# Patient Record
Sex: Female | Born: 1945 | Race: White | Hispanic: No | Marital: Married | Smoking: Never smoker
Health system: Southern US, Community
[De-identification: ages and names within clinical notes are randomized; demographics above are authoritative.]

## PROBLEM LIST (undated history)

## (undated) DIAGNOSIS — M1990 Unspecified osteoarthritis, unspecified site: Secondary | ICD-10-CM

## (undated) DIAGNOSIS — R0789 Other chest pain: Secondary | ICD-10-CM

## (undated) DIAGNOSIS — Z87442 Personal history of urinary calculi: Secondary | ICD-10-CM

## (undated) DIAGNOSIS — K635 Polyp of colon: Secondary | ICD-10-CM

## (undated) DIAGNOSIS — F411 Generalized anxiety disorder: Secondary | ICD-10-CM

## (undated) DIAGNOSIS — F430 Acute stress reaction: Secondary | ICD-10-CM

## (undated) DIAGNOSIS — M810 Age-related osteoporosis without current pathological fracture: Secondary | ICD-10-CM

## (undated) DIAGNOSIS — R0600 Dyspnea, unspecified: Secondary | ICD-10-CM

## (undated) DIAGNOSIS — J189 Pneumonia, unspecified organism: Secondary | ICD-10-CM

## (undated) DIAGNOSIS — G43909 Migraine, unspecified, not intractable, without status migrainosus: Secondary | ICD-10-CM

## (undated) DIAGNOSIS — R928 Other abnormal and inconclusive findings on diagnostic imaging of breast: Secondary | ICD-10-CM

## (undated) DIAGNOSIS — T7840XA Allergy, unspecified, initial encounter: Secondary | ICD-10-CM

## (undated) DIAGNOSIS — K219 Gastro-esophageal reflux disease without esophagitis: Secondary | ICD-10-CM

## (undated) DIAGNOSIS — R079 Chest pain, unspecified: Secondary | ICD-10-CM

## (undated) DIAGNOSIS — Z9221 Personal history of antineoplastic chemotherapy: Secondary | ICD-10-CM

## (undated) DIAGNOSIS — Z923 Personal history of irradiation: Secondary | ICD-10-CM

## (undated) DIAGNOSIS — C801 Malignant (primary) neoplasm, unspecified: Secondary | ICD-10-CM

## (undated) DIAGNOSIS — E0590 Thyrotoxicosis, unspecified without thyrotoxic crisis or storm: Secondary | ICD-10-CM

## (undated) DIAGNOSIS — H269 Unspecified cataract: Secondary | ICD-10-CM

## (undated) DIAGNOSIS — I10 Essential (primary) hypertension: Secondary | ICD-10-CM

## (undated) DIAGNOSIS — E039 Hypothyroidism, unspecified: Secondary | ICD-10-CM

## (undated) DIAGNOSIS — E559 Vitamin D deficiency, unspecified: Secondary | ICD-10-CM

## (undated) DIAGNOSIS — J309 Allergic rhinitis, unspecified: Secondary | ICD-10-CM

## (undated) DIAGNOSIS — F419 Anxiety disorder, unspecified: Secondary | ICD-10-CM

## (undated) DIAGNOSIS — N952 Postmenopausal atrophic vaginitis: Secondary | ICD-10-CM

## (undated) HISTORY — DX: Polyp of colon: K63.5

## (undated) HISTORY — DX: Age-related osteoporosis without current pathological fracture: M81.0

## (undated) HISTORY — PX: TUBAL LIGATION: SHX77

## (undated) HISTORY — DX: Allergy, unspecified, initial encounter: T78.40XA

## (undated) HISTORY — DX: Acute stress reaction: F43.0

## (undated) HISTORY — PX: COLONOSCOPY W/ POLYPECTOMY: SHX1380

## (undated) HISTORY — DX: Unspecified cataract: H26.9

## (undated) HISTORY — DX: Other chest pain: R07.89

## (undated) HISTORY — DX: Anxiety disorder, unspecified: F41.9

## (undated) HISTORY — DX: Hypothyroidism, unspecified: E03.9

## (undated) HISTORY — DX: Allergic rhinitis, unspecified: J30.9

## (undated) HISTORY — DX: Generalized anxiety disorder: F41.1

## (undated) HISTORY — DX: Vitamin D deficiency, unspecified: E55.9

## (undated) HISTORY — PX: TONSILLECTOMY: SUR1361

## (undated) HISTORY — PX: HYSTERECTOMY ABDOMINAL WITH SALPINGECTOMY: SHX6725

## (undated) HISTORY — DX: Other abnormal and inconclusive findings on diagnostic imaging of breast: R92.8

## (undated) HISTORY — DX: Postmenopausal atrophic vaginitis: N95.2

## (undated) HISTORY — DX: Essential (primary) hypertension: I10

## (undated) HISTORY — PX: ESOPHAGEAL DILATION: SHX303

## (undated) HISTORY — DX: Thyrotoxicosis, unspecified without thyrotoxic crisis or storm: E05.90

## (undated) HISTORY — DX: Migraine, unspecified, not intractable, without status migrainosus: G43.909

## (undated) HISTORY — PX: ABDOMINAL HYSTERECTOMY: SHX81

## (undated) HISTORY — DX: Gastro-esophageal reflux disease without esophagitis: K21.9

## (undated) HISTORY — PX: EYE SURGERY: SHX253

---

## 2016-09-13 DIAGNOSIS — N6311 Unspecified lump in the right breast, upper outer quadrant: Secondary | ICD-10-CM | POA: Diagnosis not present

## 2016-09-13 DIAGNOSIS — N6310 Unspecified lump in the right breast, unspecified quadrant: Secondary | ICD-10-CM | POA: Diagnosis not present

## 2016-09-13 DIAGNOSIS — N651 Disproportion of reconstructed breast: Secondary | ICD-10-CM | POA: Diagnosis not present

## 2016-12-23 DIAGNOSIS — G441 Vascular headache, not elsewhere classified: Secondary | ICD-10-CM | POA: Diagnosis not present

## 2016-12-23 DIAGNOSIS — J019 Acute sinusitis, unspecified: Secondary | ICD-10-CM | POA: Diagnosis not present

## 2016-12-27 DIAGNOSIS — N766 Ulceration of vulva: Secondary | ICD-10-CM | POA: Diagnosis not present

## 2016-12-27 DIAGNOSIS — R300 Dysuria: Secondary | ICD-10-CM | POA: Diagnosis not present

## 2017-01-21 DIAGNOSIS — M5137 Other intervertebral disc degeneration, lumbosacral region: Secondary | ICD-10-CM | POA: Diagnosis not present

## 2017-01-21 DIAGNOSIS — M9904 Segmental and somatic dysfunction of sacral region: Secondary | ICD-10-CM | POA: Diagnosis not present

## 2017-01-21 DIAGNOSIS — M5136 Other intervertebral disc degeneration, lumbar region: Secondary | ICD-10-CM | POA: Diagnosis not present

## 2017-01-21 DIAGNOSIS — M9902 Segmental and somatic dysfunction of thoracic region: Secondary | ICD-10-CM | POA: Diagnosis not present

## 2017-01-21 DIAGNOSIS — M5134 Other intervertebral disc degeneration, thoracic region: Secondary | ICD-10-CM | POA: Diagnosis not present

## 2017-01-21 DIAGNOSIS — M9901 Segmental and somatic dysfunction of cervical region: Secondary | ICD-10-CM | POA: Diagnosis not present

## 2017-01-21 DIAGNOSIS — M9903 Segmental and somatic dysfunction of lumbar region: Secondary | ICD-10-CM | POA: Diagnosis not present

## 2017-01-21 DIAGNOSIS — M50322 Other cervical disc degeneration at C5-C6 level: Secondary | ICD-10-CM | POA: Diagnosis not present

## 2017-01-22 DIAGNOSIS — R300 Dysuria: Secondary | ICD-10-CM | POA: Diagnosis not present

## 2017-01-24 DIAGNOSIS — M50322 Other cervical disc degeneration at C5-C6 level: Secondary | ICD-10-CM | POA: Diagnosis not present

## 2017-01-24 DIAGNOSIS — M5137 Other intervertebral disc degeneration, lumbosacral region: Secondary | ICD-10-CM | POA: Diagnosis not present

## 2017-01-24 DIAGNOSIS — M5136 Other intervertebral disc degeneration, lumbar region: Secondary | ICD-10-CM | POA: Diagnosis not present

## 2017-01-24 DIAGNOSIS — M9904 Segmental and somatic dysfunction of sacral region: Secondary | ICD-10-CM | POA: Diagnosis not present

## 2017-01-24 DIAGNOSIS — M9903 Segmental and somatic dysfunction of lumbar region: Secondary | ICD-10-CM | POA: Diagnosis not present

## 2017-01-24 DIAGNOSIS — M9901 Segmental and somatic dysfunction of cervical region: Secondary | ICD-10-CM | POA: Diagnosis not present

## 2017-01-24 DIAGNOSIS — M5134 Other intervertebral disc degeneration, thoracic region: Secondary | ICD-10-CM | POA: Diagnosis not present

## 2017-01-24 DIAGNOSIS — M9902 Segmental and somatic dysfunction of thoracic region: Secondary | ICD-10-CM | POA: Diagnosis not present

## 2017-01-28 DIAGNOSIS — M5136 Other intervertebral disc degeneration, lumbar region: Secondary | ICD-10-CM | POA: Diagnosis not present

## 2017-01-28 DIAGNOSIS — M9901 Segmental and somatic dysfunction of cervical region: Secondary | ICD-10-CM | POA: Diagnosis not present

## 2017-01-28 DIAGNOSIS — M9903 Segmental and somatic dysfunction of lumbar region: Secondary | ICD-10-CM | POA: Diagnosis not present

## 2017-01-28 DIAGNOSIS — M9902 Segmental and somatic dysfunction of thoracic region: Secondary | ICD-10-CM | POA: Diagnosis not present

## 2017-01-28 DIAGNOSIS — M5137 Other intervertebral disc degeneration, lumbosacral region: Secondary | ICD-10-CM | POA: Diagnosis not present

## 2017-01-28 DIAGNOSIS — M9904 Segmental and somatic dysfunction of sacral region: Secondary | ICD-10-CM | POA: Diagnosis not present

## 2017-01-28 DIAGNOSIS — M5134 Other intervertebral disc degeneration, thoracic region: Secondary | ICD-10-CM | POA: Diagnosis not present

## 2017-01-28 DIAGNOSIS — M50322 Other cervical disc degeneration at C5-C6 level: Secondary | ICD-10-CM | POA: Diagnosis not present

## 2017-01-30 DIAGNOSIS — M9904 Segmental and somatic dysfunction of sacral region: Secondary | ICD-10-CM | POA: Diagnosis not present

## 2017-01-30 DIAGNOSIS — M5134 Other intervertebral disc degeneration, thoracic region: Secondary | ICD-10-CM | POA: Diagnosis not present

## 2017-01-30 DIAGNOSIS — M50322 Other cervical disc degeneration at C5-C6 level: Secondary | ICD-10-CM | POA: Diagnosis not present

## 2017-01-30 DIAGNOSIS — M5137 Other intervertebral disc degeneration, lumbosacral region: Secondary | ICD-10-CM | POA: Diagnosis not present

## 2017-01-30 DIAGNOSIS — M9902 Segmental and somatic dysfunction of thoracic region: Secondary | ICD-10-CM | POA: Diagnosis not present

## 2017-01-30 DIAGNOSIS — M9901 Segmental and somatic dysfunction of cervical region: Secondary | ICD-10-CM | POA: Diagnosis not present

## 2017-01-30 DIAGNOSIS — M5136 Other intervertebral disc degeneration, lumbar region: Secondary | ICD-10-CM | POA: Diagnosis not present

## 2017-01-30 DIAGNOSIS — M9903 Segmental and somatic dysfunction of lumbar region: Secondary | ICD-10-CM | POA: Diagnosis not present

## 2017-02-04 DIAGNOSIS — M5134 Other intervertebral disc degeneration, thoracic region: Secondary | ICD-10-CM | POA: Diagnosis not present

## 2017-02-04 DIAGNOSIS — M50322 Other cervical disc degeneration at C5-C6 level: Secondary | ICD-10-CM | POA: Diagnosis not present

## 2017-02-04 DIAGNOSIS — M9901 Segmental and somatic dysfunction of cervical region: Secondary | ICD-10-CM | POA: Diagnosis not present

## 2017-02-04 DIAGNOSIS — M9902 Segmental and somatic dysfunction of thoracic region: Secondary | ICD-10-CM | POA: Diagnosis not present

## 2017-02-04 DIAGNOSIS — M5136 Other intervertebral disc degeneration, lumbar region: Secondary | ICD-10-CM | POA: Diagnosis not present

## 2017-02-04 DIAGNOSIS — M5137 Other intervertebral disc degeneration, lumbosacral region: Secondary | ICD-10-CM | POA: Diagnosis not present

## 2017-02-04 DIAGNOSIS — M9904 Segmental and somatic dysfunction of sacral region: Secondary | ICD-10-CM | POA: Diagnosis not present

## 2017-02-04 DIAGNOSIS — M9903 Segmental and somatic dysfunction of lumbar region: Secondary | ICD-10-CM | POA: Diagnosis not present

## 2017-02-06 DIAGNOSIS — M50322 Other cervical disc degeneration at C5-C6 level: Secondary | ICD-10-CM | POA: Diagnosis not present

## 2017-02-06 DIAGNOSIS — M5137 Other intervertebral disc degeneration, lumbosacral region: Secondary | ICD-10-CM | POA: Diagnosis not present

## 2017-02-06 DIAGNOSIS — M5134 Other intervertebral disc degeneration, thoracic region: Secondary | ICD-10-CM | POA: Diagnosis not present

## 2017-02-06 DIAGNOSIS — M9901 Segmental and somatic dysfunction of cervical region: Secondary | ICD-10-CM | POA: Diagnosis not present

## 2017-02-06 DIAGNOSIS — M9904 Segmental and somatic dysfunction of sacral region: Secondary | ICD-10-CM | POA: Diagnosis not present

## 2017-02-06 DIAGNOSIS — M5136 Other intervertebral disc degeneration, lumbar region: Secondary | ICD-10-CM | POA: Diagnosis not present

## 2017-02-06 DIAGNOSIS — M9903 Segmental and somatic dysfunction of lumbar region: Secondary | ICD-10-CM | POA: Diagnosis not present

## 2017-02-06 DIAGNOSIS — M9902 Segmental and somatic dysfunction of thoracic region: Secondary | ICD-10-CM | POA: Diagnosis not present

## 2017-02-11 DIAGNOSIS — M5137 Other intervertebral disc degeneration, lumbosacral region: Secondary | ICD-10-CM | POA: Diagnosis not present

## 2017-02-11 DIAGNOSIS — M9902 Segmental and somatic dysfunction of thoracic region: Secondary | ICD-10-CM | POA: Diagnosis not present

## 2017-02-11 DIAGNOSIS — M50322 Other cervical disc degeneration at C5-C6 level: Secondary | ICD-10-CM | POA: Diagnosis not present

## 2017-02-11 DIAGNOSIS — M5134 Other intervertebral disc degeneration, thoracic region: Secondary | ICD-10-CM | POA: Diagnosis not present

## 2017-02-11 DIAGNOSIS — M9903 Segmental and somatic dysfunction of lumbar region: Secondary | ICD-10-CM | POA: Diagnosis not present

## 2017-02-11 DIAGNOSIS — M5136 Other intervertebral disc degeneration, lumbar region: Secondary | ICD-10-CM | POA: Diagnosis not present

## 2017-02-11 DIAGNOSIS — M9901 Segmental and somatic dysfunction of cervical region: Secondary | ICD-10-CM | POA: Diagnosis not present

## 2017-02-11 DIAGNOSIS — M9904 Segmental and somatic dysfunction of sacral region: Secondary | ICD-10-CM | POA: Diagnosis not present

## 2017-02-13 DIAGNOSIS — M5137 Other intervertebral disc degeneration, lumbosacral region: Secondary | ICD-10-CM | POA: Diagnosis not present

## 2017-02-13 DIAGNOSIS — M50322 Other cervical disc degeneration at C5-C6 level: Secondary | ICD-10-CM | POA: Diagnosis not present

## 2017-02-13 DIAGNOSIS — M9902 Segmental and somatic dysfunction of thoracic region: Secondary | ICD-10-CM | POA: Diagnosis not present

## 2017-02-13 DIAGNOSIS — M9903 Segmental and somatic dysfunction of lumbar region: Secondary | ICD-10-CM | POA: Diagnosis not present

## 2017-02-13 DIAGNOSIS — M9901 Segmental and somatic dysfunction of cervical region: Secondary | ICD-10-CM | POA: Diagnosis not present

## 2017-02-13 DIAGNOSIS — M9904 Segmental and somatic dysfunction of sacral region: Secondary | ICD-10-CM | POA: Diagnosis not present

## 2017-02-13 DIAGNOSIS — M5136 Other intervertebral disc degeneration, lumbar region: Secondary | ICD-10-CM | POA: Diagnosis not present

## 2017-02-13 DIAGNOSIS — M5134 Other intervertebral disc degeneration, thoracic region: Secondary | ICD-10-CM | POA: Diagnosis not present

## 2017-02-14 DIAGNOSIS — Z1231 Encounter for screening mammogram for malignant neoplasm of breast: Secondary | ICD-10-CM | POA: Diagnosis not present

## 2017-02-14 DIAGNOSIS — E039 Hypothyroidism, unspecified: Secondary | ICD-10-CM | POA: Diagnosis not present

## 2017-02-14 DIAGNOSIS — N952 Postmenopausal atrophic vaginitis: Secondary | ICD-10-CM | POA: Diagnosis not present

## 2017-02-14 DIAGNOSIS — R928 Other abnormal and inconclusive findings on diagnostic imaging of breast: Secondary | ICD-10-CM | POA: Diagnosis not present

## 2017-02-14 DIAGNOSIS — E559 Vitamin D deficiency, unspecified: Secondary | ICD-10-CM | POA: Diagnosis not present

## 2017-02-14 DIAGNOSIS — N8111 Cystocele, midline: Secondary | ICD-10-CM | POA: Diagnosis not present

## 2017-02-14 DIAGNOSIS — Z136 Encounter for screening for cardiovascular disorders: Secondary | ICD-10-CM | POA: Diagnosis not present

## 2017-02-14 DIAGNOSIS — Z131 Encounter for screening for diabetes mellitus: Secondary | ICD-10-CM | POA: Diagnosis not present

## 2017-02-14 DIAGNOSIS — Z01419 Encounter for gynecological examination (general) (routine) without abnormal findings: Secondary | ICD-10-CM | POA: Diagnosis not present

## 2017-02-14 DIAGNOSIS — M810 Age-related osteoporosis without current pathological fracture: Secondary | ICD-10-CM | POA: Diagnosis not present

## 2017-02-14 DIAGNOSIS — N393 Stress incontinence (female) (male): Secondary | ICD-10-CM | POA: Diagnosis not present

## 2017-02-14 DIAGNOSIS — R922 Inconclusive mammogram: Secondary | ICD-10-CM | POA: Diagnosis not present

## 2017-02-14 DIAGNOSIS — Z1239 Encounter for other screening for malignant neoplasm of breast: Secondary | ICD-10-CM | POA: Diagnosis not present

## 2017-02-14 DIAGNOSIS — Z1212 Encounter for screening for malignant neoplasm of rectum: Secondary | ICD-10-CM | POA: Diagnosis not present

## 2017-02-18 DIAGNOSIS — M5134 Other intervertebral disc degeneration, thoracic region: Secondary | ICD-10-CM | POA: Diagnosis not present

## 2017-02-18 DIAGNOSIS — M5136 Other intervertebral disc degeneration, lumbar region: Secondary | ICD-10-CM | POA: Diagnosis not present

## 2017-02-18 DIAGNOSIS — M5137 Other intervertebral disc degeneration, lumbosacral region: Secondary | ICD-10-CM | POA: Diagnosis not present

## 2017-02-18 DIAGNOSIS — M9902 Segmental and somatic dysfunction of thoracic region: Secondary | ICD-10-CM | POA: Diagnosis not present

## 2017-02-18 DIAGNOSIS — M50322 Other cervical disc degeneration at C5-C6 level: Secondary | ICD-10-CM | POA: Diagnosis not present

## 2017-02-18 DIAGNOSIS — M9903 Segmental and somatic dysfunction of lumbar region: Secondary | ICD-10-CM | POA: Diagnosis not present

## 2017-02-18 DIAGNOSIS — M9904 Segmental and somatic dysfunction of sacral region: Secondary | ICD-10-CM | POA: Diagnosis not present

## 2017-02-18 DIAGNOSIS — M9901 Segmental and somatic dysfunction of cervical region: Secondary | ICD-10-CM | POA: Diagnosis not present

## 2017-07-04 DIAGNOSIS — R05 Cough: Secondary | ICD-10-CM | POA: Diagnosis not present

## 2017-07-04 DIAGNOSIS — N390 Urinary tract infection, site not specified: Secondary | ICD-10-CM | POA: Diagnosis not present

## 2017-07-04 DIAGNOSIS — A319 Mycobacterial infection, unspecified: Secondary | ICD-10-CM | POA: Diagnosis not present

## 2017-07-04 DIAGNOSIS — J069 Acute upper respiratory infection, unspecified: Secondary | ICD-10-CM | POA: Diagnosis not present

## 2017-08-22 DIAGNOSIS — M810 Age-related osteoporosis without current pathological fracture: Secondary | ICD-10-CM | POA: Diagnosis not present

## 2017-08-22 DIAGNOSIS — E039 Hypothyroidism, unspecified: Secondary | ICD-10-CM | POA: Diagnosis not present

## 2017-09-23 DIAGNOSIS — R079 Chest pain, unspecified: Secondary | ICD-10-CM | POA: Diagnosis not present

## 2017-09-23 DIAGNOSIS — I10 Essential (primary) hypertension: Secondary | ICD-10-CM | POA: Diagnosis not present

## 2017-09-23 DIAGNOSIS — T162XXA Foreign body in left ear, initial encounter: Secondary | ICD-10-CM | POA: Diagnosis not present

## 2017-10-10 DIAGNOSIS — R1314 Dysphagia, pharyngoesophageal phase: Secondary | ICD-10-CM | POA: Diagnosis not present

## 2017-10-10 DIAGNOSIS — Z1211 Encounter for screening for malignant neoplasm of colon: Secondary | ICD-10-CM | POA: Diagnosis not present

## 2017-10-10 DIAGNOSIS — K219 Gastro-esophageal reflux disease without esophagitis: Secondary | ICD-10-CM | POA: Diagnosis not present

## 2017-10-10 DIAGNOSIS — R0789 Other chest pain: Secondary | ICD-10-CM | POA: Diagnosis not present

## 2017-10-14 DIAGNOSIS — J069 Acute upper respiratory infection, unspecified: Secondary | ICD-10-CM | POA: Diagnosis not present

## 2017-10-20 DIAGNOSIS — R1314 Dysphagia, pharyngoesophageal phase: Secondary | ICD-10-CM | POA: Diagnosis not present

## 2017-10-20 DIAGNOSIS — K228 Other specified diseases of esophagus: Secondary | ICD-10-CM | POA: Diagnosis not present

## 2017-10-20 DIAGNOSIS — Z1211 Encounter for screening for malignant neoplasm of colon: Secondary | ICD-10-CM | POA: Diagnosis not present

## 2017-10-20 DIAGNOSIS — K210 Gastro-esophageal reflux disease with esophagitis: Secondary | ICD-10-CM | POA: Diagnosis not present

## 2017-10-20 DIAGNOSIS — K449 Diaphragmatic hernia without obstruction or gangrene: Secondary | ICD-10-CM | POA: Diagnosis not present

## 2017-10-20 DIAGNOSIS — K635 Polyp of colon: Secondary | ICD-10-CM | POA: Diagnosis not present

## 2017-10-20 DIAGNOSIS — D122 Benign neoplasm of ascending colon: Secondary | ICD-10-CM | POA: Diagnosis not present

## 2017-10-20 DIAGNOSIS — K2950 Unspecified chronic gastritis without bleeding: Secondary | ICD-10-CM | POA: Diagnosis not present

## 2017-10-20 DIAGNOSIS — D123 Benign neoplasm of transverse colon: Secondary | ICD-10-CM | POA: Diagnosis not present

## 2017-10-20 DIAGNOSIS — R0789 Other chest pain: Secondary | ICD-10-CM | POA: Diagnosis not present

## 2017-10-20 DIAGNOSIS — K219 Gastro-esophageal reflux disease without esophagitis: Secondary | ICD-10-CM | POA: Diagnosis not present

## 2017-12-02 DIAGNOSIS — K224 Dyskinesia of esophagus: Secondary | ICD-10-CM | POA: Diagnosis not present

## 2017-12-02 DIAGNOSIS — R079 Chest pain, unspecified: Secondary | ICD-10-CM | POA: Diagnosis not present

## 2017-12-02 DIAGNOSIS — K222 Esophageal obstruction: Secondary | ICD-10-CM | POA: Diagnosis not present

## 2018-01-13 DIAGNOSIS — N390 Urinary tract infection, site not specified: Secondary | ICD-10-CM | POA: Diagnosis not present

## 2018-01-13 DIAGNOSIS — R5383 Other fatigue: Secondary | ICD-10-CM | POA: Diagnosis not present

## 2018-01-13 DIAGNOSIS — D519 Vitamin B12 deficiency anemia, unspecified: Secondary | ICD-10-CM | POA: Diagnosis not present

## 2018-01-13 DIAGNOSIS — R05 Cough: Secondary | ICD-10-CM | POA: Diagnosis not present

## 2018-01-13 DIAGNOSIS — M839 Adult osteomalacia, unspecified: Secondary | ICD-10-CM | POA: Diagnosis not present

## 2018-02-19 DIAGNOSIS — N952 Postmenopausal atrophic vaginitis: Secondary | ICD-10-CM | POA: Diagnosis not present

## 2018-02-19 DIAGNOSIS — N649 Disorder of breast, unspecified: Secondary | ICD-10-CM | POA: Diagnosis not present

## 2018-02-19 DIAGNOSIS — Z1212 Encounter for screening for malignant neoplasm of rectum: Secondary | ICD-10-CM | POA: Diagnosis not present

## 2018-02-19 DIAGNOSIS — N6489 Other specified disorders of breast: Secondary | ICD-10-CM | POA: Diagnosis not present

## 2018-02-19 DIAGNOSIS — M810 Age-related osteoporosis without current pathological fracture: Secondary | ICD-10-CM | POA: Diagnosis not present

## 2018-02-19 DIAGNOSIS — Z01419 Encounter for gynecological examination (general) (routine) without abnormal findings: Secondary | ICD-10-CM | POA: Diagnosis not present

## 2018-02-19 DIAGNOSIS — N3941 Urge incontinence: Secondary | ICD-10-CM | POA: Diagnosis not present

## 2018-02-19 DIAGNOSIS — R928 Other abnormal and inconclusive findings on diagnostic imaging of breast: Secondary | ICD-10-CM | POA: Diagnosis not present

## 2018-02-19 DIAGNOSIS — N393 Stress incontinence (female) (male): Secondary | ICD-10-CM | POA: Diagnosis not present

## 2018-02-19 DIAGNOSIS — Z7983 Long term (current) use of bisphosphonates: Secondary | ICD-10-CM | POA: Diagnosis not present

## 2018-02-19 LAB — HM DEXA SCAN

## 2018-02-19 LAB — HM MAMMOGRAPHY

## 2018-03-26 DIAGNOSIS — H26492 Other secondary cataract, left eye: Secondary | ICD-10-CM | POA: Diagnosis not present

## 2018-03-26 DIAGNOSIS — R079 Chest pain, unspecified: Secondary | ICD-10-CM | POA: Diagnosis not present

## 2018-03-26 DIAGNOSIS — K224 Dyskinesia of esophagus: Secondary | ICD-10-CM | POA: Diagnosis not present

## 2018-03-26 DIAGNOSIS — R9430 Abnormal result of cardiovascular function study, unspecified: Secondary | ICD-10-CM | POA: Diagnosis not present

## 2018-03-26 DIAGNOSIS — Z961 Presence of intraocular lens: Secondary | ICD-10-CM | POA: Diagnosis not present

## 2018-03-26 DIAGNOSIS — I10 Essential (primary) hypertension: Secondary | ICD-10-CM | POA: Diagnosis not present

## 2018-04-16 DIAGNOSIS — R9430 Abnormal result of cardiovascular function study, unspecified: Secondary | ICD-10-CM | POA: Diagnosis not present

## 2018-04-16 DIAGNOSIS — R079 Chest pain, unspecified: Secondary | ICD-10-CM | POA: Diagnosis not present

## 2018-04-16 DIAGNOSIS — K224 Dyskinesia of esophagus: Secondary | ICD-10-CM | POA: Diagnosis not present

## 2018-04-16 DIAGNOSIS — I10 Essential (primary) hypertension: Secondary | ICD-10-CM | POA: Diagnosis not present

## 2018-04-27 DIAGNOSIS — R600 Localized edema: Secondary | ICD-10-CM | POA: Diagnosis not present

## 2018-05-07 DIAGNOSIS — J209 Acute bronchitis, unspecified: Secondary | ICD-10-CM | POA: Diagnosis not present

## 2018-05-07 DIAGNOSIS — J040 Acute laryngitis: Secondary | ICD-10-CM | POA: Diagnosis not present

## 2018-05-22 DIAGNOSIS — H26492 Other secondary cataract, left eye: Secondary | ICD-10-CM | POA: Diagnosis not present

## 2018-05-22 DIAGNOSIS — Z961 Presence of intraocular lens: Secondary | ICD-10-CM | POA: Diagnosis not present

## 2018-07-05 DIAGNOSIS — H6691 Otitis media, unspecified, right ear: Secondary | ICD-10-CM | POA: Diagnosis not present

## 2018-07-05 DIAGNOSIS — H6991 Unspecified Eustachian tube disorder, right ear: Secondary | ICD-10-CM | POA: Diagnosis not present

## 2018-07-05 DIAGNOSIS — J069 Acute upper respiratory infection, unspecified: Secondary | ICD-10-CM | POA: Diagnosis not present

## 2018-07-23 DIAGNOSIS — H52223 Regular astigmatism, bilateral: Secondary | ICD-10-CM | POA: Diagnosis not present

## 2018-07-23 DIAGNOSIS — H401131 Primary open-angle glaucoma, bilateral, mild stage: Secondary | ICD-10-CM | POA: Diagnosis not present

## 2018-07-23 DIAGNOSIS — H524 Presbyopia: Secondary | ICD-10-CM | POA: Diagnosis not present

## 2018-07-23 DIAGNOSIS — H5213 Myopia, bilateral: Secondary | ICD-10-CM | POA: Diagnosis not present

## 2018-07-23 DIAGNOSIS — H04123 Dry eye syndrome of bilateral lacrimal glands: Secondary | ICD-10-CM | POA: Diagnosis not present

## 2018-07-23 DIAGNOSIS — Z961 Presence of intraocular lens: Secondary | ICD-10-CM | POA: Diagnosis not present

## 2018-07-23 DIAGNOSIS — H401121 Primary open-angle glaucoma, left eye, mild stage: Secondary | ICD-10-CM | POA: Diagnosis not present

## 2018-07-23 DIAGNOSIS — H401111 Primary open-angle glaucoma, right eye, mild stage: Secondary | ICD-10-CM | POA: Diagnosis not present

## 2018-07-23 DIAGNOSIS — H43813 Vitreous degeneration, bilateral: Secondary | ICD-10-CM | POA: Diagnosis not present

## 2018-08-21 DIAGNOSIS — M9902 Segmental and somatic dysfunction of thoracic region: Secondary | ICD-10-CM | POA: Diagnosis not present

## 2018-08-21 DIAGNOSIS — M5137 Other intervertebral disc degeneration, lumbosacral region: Secondary | ICD-10-CM | POA: Diagnosis not present

## 2018-08-21 DIAGNOSIS — M5136 Other intervertebral disc degeneration, lumbar region: Secondary | ICD-10-CM | POA: Diagnosis not present

## 2018-08-21 DIAGNOSIS — M5134 Other intervertebral disc degeneration, thoracic region: Secondary | ICD-10-CM | POA: Diagnosis not present

## 2018-08-21 DIAGNOSIS — M9904 Segmental and somatic dysfunction of sacral region: Secondary | ICD-10-CM | POA: Diagnosis not present

## 2018-08-21 DIAGNOSIS — M50322 Other cervical disc degeneration at C5-C6 level: Secondary | ICD-10-CM | POA: Diagnosis not present

## 2018-08-21 DIAGNOSIS — M9903 Segmental and somatic dysfunction of lumbar region: Secondary | ICD-10-CM | POA: Diagnosis not present

## 2018-08-21 DIAGNOSIS — M9901 Segmental and somatic dysfunction of cervical region: Secondary | ICD-10-CM | POA: Diagnosis not present

## 2018-08-24 DIAGNOSIS — E559 Vitamin D deficiency, unspecified: Secondary | ICD-10-CM | POA: Diagnosis not present

## 2018-08-24 DIAGNOSIS — M810 Age-related osteoporosis without current pathological fracture: Secondary | ICD-10-CM | POA: Diagnosis not present

## 2018-08-24 DIAGNOSIS — E039 Hypothyroidism, unspecified: Secondary | ICD-10-CM | POA: Diagnosis not present

## 2018-10-07 DIAGNOSIS — M461 Sacroiliitis, not elsewhere classified: Secondary | ICD-10-CM | POA: Diagnosis not present

## 2018-10-07 DIAGNOSIS — M9905 Segmental and somatic dysfunction of pelvic region: Secondary | ICD-10-CM | POA: Diagnosis not present

## 2018-10-07 DIAGNOSIS — M9902 Segmental and somatic dysfunction of thoracic region: Secondary | ICD-10-CM | POA: Diagnosis not present

## 2018-10-07 DIAGNOSIS — M25551 Pain in right hip: Secondary | ICD-10-CM | POA: Diagnosis not present

## 2018-10-07 DIAGNOSIS — M8580 Other specified disorders of bone density and structure, unspecified site: Secondary | ICD-10-CM | POA: Diagnosis not present

## 2018-10-07 DIAGNOSIS — M25561 Pain in right knee: Secondary | ICD-10-CM | POA: Diagnosis not present

## 2018-10-07 DIAGNOSIS — M9904 Segmental and somatic dysfunction of sacral region: Secondary | ICD-10-CM | POA: Diagnosis not present

## 2018-10-07 DIAGNOSIS — M7631 Iliotibial band syndrome, right leg: Secondary | ICD-10-CM | POA: Diagnosis not present

## 2018-10-07 DIAGNOSIS — M9906 Segmental and somatic dysfunction of lower extremity: Secondary | ICD-10-CM | POA: Diagnosis not present

## 2018-10-07 DIAGNOSIS — M40294 Other kyphosis, thoracic region: Secondary | ICD-10-CM | POA: Diagnosis not present

## 2018-10-09 DIAGNOSIS — M461 Sacroiliitis, not elsewhere classified: Secondary | ICD-10-CM | POA: Diagnosis not present

## 2018-10-09 DIAGNOSIS — M25561 Pain in right knee: Secondary | ICD-10-CM | POA: Diagnosis not present

## 2018-10-09 DIAGNOSIS — M25551 Pain in right hip: Secondary | ICD-10-CM | POA: Diagnosis not present

## 2018-10-09 DIAGNOSIS — M9905 Segmental and somatic dysfunction of pelvic region: Secondary | ICD-10-CM | POA: Diagnosis not present

## 2018-10-09 DIAGNOSIS — M40294 Other kyphosis, thoracic region: Secondary | ICD-10-CM | POA: Diagnosis not present

## 2018-10-09 DIAGNOSIS — M9902 Segmental and somatic dysfunction of thoracic region: Secondary | ICD-10-CM | POA: Diagnosis not present

## 2018-10-09 DIAGNOSIS — M8580 Other specified disorders of bone density and structure, unspecified site: Secondary | ICD-10-CM | POA: Diagnosis not present

## 2018-10-09 DIAGNOSIS — M7631 Iliotibial band syndrome, right leg: Secondary | ICD-10-CM | POA: Diagnosis not present

## 2018-10-09 DIAGNOSIS — M9906 Segmental and somatic dysfunction of lower extremity: Secondary | ICD-10-CM | POA: Diagnosis not present

## 2018-10-09 DIAGNOSIS — M9904 Segmental and somatic dysfunction of sacral region: Secondary | ICD-10-CM | POA: Diagnosis not present

## 2018-10-30 ENCOUNTER — Encounter: Payer: Self-pay | Admitting: Osteopathic Medicine

## 2018-10-30 ENCOUNTER — Telehealth: Payer: Self-pay | Admitting: Osteopathic Medicine

## 2018-10-30 ENCOUNTER — Ambulatory Visit (INDEPENDENT_AMBULATORY_CARE_PROVIDER_SITE_OTHER): Payer: Medicare Other | Admitting: Osteopathic Medicine

## 2018-10-30 DIAGNOSIS — F430 Acute stress reaction: Secondary | ICD-10-CM | POA: Diagnosis not present

## 2018-10-30 DIAGNOSIS — E039 Hypothyroidism, unspecified: Secondary | ICD-10-CM

## 2018-10-30 DIAGNOSIS — I10 Essential (primary) hypertension: Secondary | ICD-10-CM | POA: Diagnosis not present

## 2018-10-30 DIAGNOSIS — G43909 Migraine, unspecified, not intractable, without status migrainosus: Secondary | ICD-10-CM | POA: Diagnosis not present

## 2018-10-30 DIAGNOSIS — F411 Generalized anxiety disorder: Secondary | ICD-10-CM | POA: Diagnosis not present

## 2018-10-30 DIAGNOSIS — M810 Age-related osteoporosis without current pathological fracture: Secondary | ICD-10-CM | POA: Diagnosis not present

## 2018-10-30 DIAGNOSIS — N952 Postmenopausal atrophic vaginitis: Secondary | ICD-10-CM | POA: Diagnosis not present

## 2018-10-30 DIAGNOSIS — K219 Gastro-esophageal reflux disease without esophagitis: Secondary | ICD-10-CM | POA: Diagnosis not present

## 2018-10-30 DIAGNOSIS — R0789 Other chest pain: Secondary | ICD-10-CM

## 2018-10-30 DIAGNOSIS — J309 Allergic rhinitis, unspecified: Secondary | ICD-10-CM

## 2018-10-30 DIAGNOSIS — E559 Vitamin D deficiency, unspecified: Secondary | ICD-10-CM | POA: Insufficient documentation

## 2018-10-30 HISTORY — DX: Gastro-esophageal reflux disease without esophagitis: K21.9

## 2018-10-30 HISTORY — DX: Essential (primary) hypertension: I10

## 2018-10-30 HISTORY — DX: Postmenopausal atrophic vaginitis: N95.2

## 2018-10-30 HISTORY — DX: Other chest pain: R07.89

## 2018-10-30 HISTORY — DX: Age-related osteoporosis without current pathological fracture: M81.0

## 2018-10-30 HISTORY — DX: Migraine, unspecified, not intractable, without status migrainosus: G43.909

## 2018-10-30 HISTORY — DX: Allergic rhinitis, unspecified: J30.9

## 2018-10-30 HISTORY — DX: Hypothyroidism, unspecified: E03.9

## 2018-10-30 HISTORY — DX: Generalized anxiety disorder: F41.1

## 2018-10-30 HISTORY — DX: Acute stress reaction: F43.0

## 2018-10-30 HISTORY — DX: Vitamin D deficiency, unspecified: E55.9

## 2018-10-30 MED ORDER — CETIRIZINE HCL 10 MG PO TABS: 10.0000 mg | ORAL_TABLET | Freq: Every day | ORAL | 1 refills | Status: DC

## 2018-10-30 MED ORDER — ESTRADIOL 10 MCG VA INST: 10.0000 ug | VAGINAL_INSERT | VAGINAL | 11 refills | Status: DC

## 2018-10-30 MED ORDER — LEVOTHYROXINE SODIUM 75 MCG PO TABS: 75.0000 ug | ORAL_TABLET | Freq: Every day | ORAL | 3 refills | Status: DC

## 2018-10-30 MED ORDER — ESOMEPRAZOLE MAGNESIUM 40 MG PO CPDR: 40.0000 mg | DELAYED_RELEASE_CAPSULE | Freq: Every day | ORAL | 3 refills | Status: DC

## 2018-10-30 MED ORDER — NIFEDIPINE ER OSMOTIC RELEASE 30 MG PO TB24: 30.0000 mg | ORAL_TABLET | Freq: Every day | ORAL | 3 refills | Status: DC

## 2018-10-30 MED ORDER — MONTELUKAST SODIUM 10 MG PO TABS: 10.0000 mg | ORAL_TABLET | Freq: Every day | ORAL | 1 refills | Status: DC

## 2018-10-30 MED ORDER — HYDROCHLOROTHIAZIDE 25 MG PO TABS: 25.0000 mg | ORAL_TABLET | Freq: Every day | ORAL | 0 refills | Status: DC

## 2018-10-30 MED ORDER — NITROGLYCERIN 0.4 MG SL SUBL: 0.4000 mg | SUBLINGUAL_TABLET | SUBLINGUAL | 1 refills | Status: DC | PRN

## 2018-10-30 MED ORDER — SUMATRIPTAN SUCCINATE 100 MG PO TABS: 50.0000 mg | ORAL_TABLET | ORAL | 3 refills | Status: DC | PRN

## 2018-10-30 MED ORDER — ESCITALOPRAM OXALATE 5 MG PO TABS: 5.0000 mg | ORAL_TABLET | Freq: Every day | ORAL | 0 refills | Status: DC

## 2018-10-30 NOTE — Telephone Encounter (Signed)
Pt will need Prolia injection in June. Will need labs first. Pt advised to call us in June to order labs, then she can be scheduled for Prolia.

## 2018-10-30 NOTE — Progress Notes (Signed)
HPI: Cindy [name] is a 73 y.o. female who  has no past medical history on file.  she presents to [facility] today, 10/30/18,  for chief complaint of: New to establish  See headings   Very pleasant lady here to establish care. She and her husband just moved to the area to be closer to their daughter and grandkids aged 38, 28, 79 and 4! She works from [employer] for an [employer]omet[employer].   Previously following with endocrinology in [place], last visit 08/24/2018.  Following for hypothyroidism, osteoporosis, vitamin D deficiency.  As of that visit:  Hypothyroidism: Was provided a year supply of Synthroid 75 mcg  Vitamin D deficiency: 12-week course of high-dose vitamin D 50,000 units to take once per week instructed to transition to over-the-counter 2000 units daily.    Osteoporosis: On Prolia for osteoporosis, no history of fragility fracture, last bone density test February 19, 2018.  Cardiac: paint starts in R head and travels to chest and arms, causes numbness/tingling and weakness in arm/leg, when this happens she takes nitroglycerin, reports she has had extensive "heart testing" and all was ok - cardiac cath, hospitalization on telemetry, stress test. Reports maybe 2-3 episodes past 4 mos, nitro resolves this.   Taking nifedipine extended release 30 mg daily, no previous records available from PCP or cardiology as to why she is on this medication as well as nitroglycerin as needed, hx HTN on chart.   Taking hydrochlorothiazide 25 mg as needed for LE edema.   History of migraines: Previous prescription for Imitrex 100 mg as needed. Takes rarely, couple times a year.   Stress/anxiety: Patient reports she is taking Lexapro "as needed" thinks 5 mg dose, takes about a week at a time with stressful situations (such as moving)   Allergies: Taking Zyrtec 10 mg as needed along with Singulair 10 mg at bedtime.   GU/GYN:  Using estradiol inserts, Imvexxy 10 mg twice per week      Past medical, surgical, social and family history reviewed:  Patient Active Problem List   Diagnosis Date Noted  . Hypertension 10/30/2018  . Atypical chest pain 10/30/2018  . Migraine 10/30/2018  . Hypothyroidism 10/30/2018  . Osteoporosis 10/30/2018  . GERD (gastroesophageal reflux disease) 10/30/2018  . Anxiety in acute stress reaction 10/30/2018  . Allergic rhinitis 10/30/2018  . Vitamin D deficiency 10/30/2018  . 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    Past Surgical History:  Procedure Laterality Date  . HYSTERECTOMY ABDOMINAL WITH SALPINGECTOMY      Social History   Tobacco Use  . Smoking status: Never Smoker  . Smokeless tobacco: Never Used  Substance Use Topics  . Alcohol use: Never    Frequency: Never    Family History  Problem Relation Age of Onset  . High blood pressure Mother   . Breast cancer Mother   . Heart attack Father   . Prostate cancer Brother   . Leukemia Maternal Grandfather   . Breast cancer Maternal Aunt   . Breast cancer Maternal Aunt        Current medication list and allergy/intolerance information reviewed:       No current outpatient medications on file.   No current facility-administered medications for this visit.     Allergies  Allergen Reactions  . Sulfamethoxazole-Trimethoprim Rash  . Tetracyclines & Related Rash      Review of Systems:  Constitutional:  No  fever, no chills, No recent illness, No unintentional weight  changes. No significant fatigue.   HEENT: +headache, no vision change, no hearing change, No sore throat, No  sinus pressure  Cardiac: +history of chest pain/pressure see HPI, No palpitations, No  Orthopnea  Respiratory:  +shortness of breath on exertion. No  Cough  Gastrointestinal: No  abdominal pain, +GERD, No  nausea, No  vomiting,  No  blood in stool, No  diarrhea, +constipation   Musculoskeletal: No new  myalgia/arthralgia  Skin: No  Rash, No other wounds/concerning lesions, +itching  Genitourinary: No  incontinence, No  abnormal genital bleeding, No abnormal genital discharge  Hem/Onc: No  easy bruising/bleeding, No  abnormal lymph node  Endocrine: No cold intolerance,  No heat intolerance. No polyuria/polydipsia/polyphagia, +hair loss  Neurologic: No  weakness, No  dizziness, No  slurred speech/focal weakness/facial droop  Psychiatric: No  concerns with depression, No  concerns with anxiety, No sleep problems, No mood problems  Exam:  BP 109/72 (BP Location: Left Arm, Patient Position: Sitting, Cuff Size: Normal)   Pulse (!) 106   Temp 97.7 F (36.5 C) (Oral)   Wt 145 lb 14.4 oz (66.2 kg)   Constitutional: VS see above. General Appearance: alert, well-developed, well-nourished, NAD  Eyes: Normal lids and conjunctive, non-icteric sclera  Ears, Nose, Mouth, Throat: MMM, Normal external inspection ears/nares/mouth/lips/gums. TM normal bilaterally. Pharynx/tonsils no erythema, no exudate. Nasal mucosa normal.   Neck: No masses, trachea midline. No thyroid enlargement. No tenderness/mass appreciated. No lymphadenopathy  Respiratory: Normal respiratory effort. no wheeze, no rhonchi, no rales  Cardiovascular: S1/S2 normal, no murmur, no rub/gallop auscultated. RRR. No lower extremity edema.   Gastrointestinal: Nontender, no masses. No hepatomegaly, no splenomegaly. No hernia appreciated. Bowel sounds normal. Rectal exam deferred.   Musculoskeletal: Gait normal. No clubbing/cyanosis of digits.   Neurological: Normal balance/coordination. No tremor. No cranial nerve deficit on limited exam.   Skin: warm, dry, intact. No rash/ulcer. No concerning nevi or subq nodules on limited exam.    Psychiatric: Normal judgment/insight. Normal mood and affect. Oriented x3.       ASSESSMENT/PLAN: Diagnoses of Essential hypertension, Atypical chest pain, Migraine without status migrainosus,  not intractable, unspecified migraine type, Hypothyroidism, unspecified type, Age-related osteoporosis without current pathological fracture, Gastroesophageal reflux disease without esophagitis, Anxiety in acute stress reaction, Allergic rhinitis, unspecified seasonality, unspecified trigger, Vitamin D deficiency, and Postmenopausal atrophic vaginitis were pertinent to this visit.   Orders Placed This Encounter  Procedures  . COMPLETE METABOLIC PANEL WITH GFR  . CBC  . TSH  . VITAMIN D 25 Hydroxy (Vit-D Deficiency, Fractures)    Meds ordered this encounter  Medications  . cetirizine (ZYRTEC) 10 MG tablet    Sig: Take 1 tablet (10 mg total) by mouth at bedtime.    Dispense:  90 tablet    Refill:  1  . escitalopram (LEXAPRO) 5 MG tablet    Sig: Take 1 tablet (5 mg total) by mouth daily.    Dispense:  90 tablet    Refill:  0  . esomeprazole (NEXIUM) 40 MG capsule    Sig: Take 1 capsule (40 mg total) by mouth daily.    Dispense:  90 capsule    Refill:  3  . hydrochlorothiazide (HYDRODIURIL) 25 MG tablet    Sig: Take 1 tablet (25 mg total) by mouth daily. As need for swelling    Dispense:  90 tablet    Refill:  0  . levothyroxine (SYNTHROID, LEVOTHROID) 75 MCG tablet    Sig: Take 1 tablet (75 mcg total) by  mouth daily before breakfast. Take on Days 1-6 and skip Day 7    Dispense:  90 tablet    Refill:  3  . montelukast (SINGULAIR) 10 MG tablet    Sig: Take 1 tablet (10 mg total) by mouth at bedtime.    Dispense:  90 tablet    Refill:  1  . NIFEdipine (PROCARDIA-XL/NIFEDICAL-XL) 30 MG 24 hr tablet    Sig: Take 1 tablet (30 mg total) by mouth daily.    Dispense:  90 tablet    Refill:  3  . nitroGLYCERIN (NITROSTAT) 0.4 MG SL tablet    Sig: Place 1 tablet (0.4 mg total) under the tongue every 5 (five) minutes as needed for chest pain.    Dispense:  20 tablet    Refill:  1  . SUMAtriptan (IMITREX) 100 MG tablet    Sig: Take 0.5-1 tablets (50-100 mg total) by mouth every 2 (two)  hours as needed for migraine (Max 2 pills per 24 hours).    Dispense:  10 tablet    Refill:  3  . Estradiol (IMVEXXY MAINTENANCE PACK) 10 MCG INST    Sig: Place 10 mcg vaginally 2 (two) times a week.    Dispense:  8 each    Refill:  11    Patient Instructions  Medications all refilled - [facility] can keep these on file and you can fill as needed.   Will plan to see you back in 02/2019 for Prolia injection & Medicare Wellness visit. Please get blood work done fasting at least one week prior to your visit with [place].           Visit summary with medication list and pertinent instructions was printed for patient to review. All questions at time of visit were answered - patient instructed to contact office with any additional concerns or updates. ER/RTC precautions were reviewed with the patient.   Note: Total time spent 45 minutes, greater than 50% of the visit was spent face-to-face counseling and coordinating care for the above diagnoses listed in assessment/plan.   Please note: voice recognition software was used to produce this document, and typos may escape review. Please contact Dr. Sheppard Coil for any needed clarifications.     Follow-up plan: Return for medicare wellness and prolia injection (labs prior to visit) .

## 2018-10-30 NOTE — Patient Instructions (Addendum)
Medications all refilled - [facility] can keep these on file and you can fill as needed.   Will plan to see you back in 02/2019 for Prolia injection & Medicare Wellness visit. Please get blood work done fasting at least one week prior to your visit with [place].

## 2018-12-17 ENCOUNTER — Other Ambulatory Visit: Payer: Self-pay

## 2018-12-17 ENCOUNTER — Telehealth (INDEPENDENT_AMBULATORY_CARE_PROVIDER_SITE_OTHER): Payer: Medicare Other | Admitting: Osteopathic Medicine

## 2018-12-17 ENCOUNTER — Encounter: Payer: Self-pay | Admitting: Osteopathic Medicine

## 2018-12-17 VITALS — BP 115/80 | HR 100 | Temp 97.0°F | Wt 145.0 lb

## 2018-12-17 DIAGNOSIS — J019 Acute sinusitis, unspecified: Secondary | ICD-10-CM

## 2018-12-17 MED ORDER — IPRATROPIUM BROMIDE 0.06 % NA SOLN: 2.0000 | Freq: Four times a day (QID) | NASAL | 1 refills | Status: DC

## 2018-12-17 MED ORDER — PREDNISONE 20 MG PO TABS: 20.0000 mg | ORAL_TABLET | Freq: Two times a day (BID) | ORAL | 0 refills | Status: DC

## 2018-12-17 MED ORDER — PSEUDOEPH-BROMPHEN-DM 30-2-10 MG/5ML PO SYRP: 2.5000 mL | ORAL_SOLUTION | Freq: Four times a day (QID) | ORAL | 0 refills | Status: DC | PRN

## 2018-12-17 MED ORDER — AMOXICILLIN-POT CLAVULANATE 875-125 MG PO TABS: 1.0000 | ORAL_TABLET | Freq: Two times a day (BID) | ORAL | 0 refills | Status: AC

## 2018-12-17 NOTE — Patient Instructions (Signed)
Medications & Home Remedies for Upper Respiratory Illness   Note: the following list assumes no pregnancy, normal liver & kidney function and no other drug interactions. Dr. Sheppard Coil has highlighted medications which are safe for you to use, but these may not be appropriate for everyone. Always ask a pharmacist or qualified medical provider if you have any questions!    Aches/Pains, Fever, Headache OTC Acetaminophen (Tylenol) 500 mg tablets - take max 2 tablets (1000 mg) every 6 hours (4 times per day)    Sinus Congestion Prescription Atrovent as directed OTC Nasal Saline if desired to rinse OTC Oxymetolazone (Afrin, others) sparing use due to rebound congestion, NEVER use in kids OTC Phenylephrine (Sudafed) 10 mg tablets every 4 hours (or the 12-hour formulation) OTC Diphenhydramine (Benadryl) 25 mg tablets - take max 2 tablets every 4 hours   Cough & Sore Throat Prescription cough pills or syrups as directed OTC Dextromethorphan (Robitussin, others) - cough suppressant OTC Guaifenesin (Robitussin, Mucinex, others) - expectorant (helps cough up mucus) (Dextromethorphan and Guaifenesin also come in a combination tablet/syrup) OTC Lozenges w/ Benzocaine + Menthol (Cepacol) Honey - as much as you want! Teas which "coat the throat" - look for ingredients Elm Bark, Licorice Root, Marshmallow Root   Other Prescription Oral Steroids to decrease inflammation Prescription Antibiotics if these are necessary for bacterial infection - take ALL, even if you're feeling better  OTC Zinc Lozenges within 24 hours of symptoms onset - mixed evidence this shortens the duration of the common cold Don't waste your money on Vitamin C or Echinacea in acute illness - it's already too late!

## 2018-12-17 NOTE — Progress Notes (Signed)
HPI: Cindy [name] is a 73 y.o. female who  has a past medical history of Abnormal mammogram, Allergic rhinitis (10/30/2018), Anxiety, Anxiety in acute stress reaction (10/30/2018), Atypical chest pain (10/30/2018), Colon polyps, GERD (gastroesophageal reflux disease) (10/30/2018), High blood pressure, Hypertension (10/30/2018), Hyperthyroidism, Hypothyroidism (10/30/2018), Migraine (10/30/2018), Osteoporosis (10/30/2018), Postmenopausal atrophic vaginitis (10/30/2018), and Vitamin D deficiency (10/30/2018).  she presents to [place] via [facility] virtual visit today, 12/17/18,  for chief complaint of: Sick: sinuses  . Location/Quality: sore throat, running nose, sinus congestion, cough, ears feel clogged . Duration: 7 days  . Timing: constant  . Modifying factors: had some augmentin left from previous illness which she has started now. Has also taken aleve, allergy meds, tessalon perles. Another Rx cough medicine,not sure which one.  . Assoc signs/symptoms: hoarseness, fatigue      Past medical, surgical, social and family history reviewed and updated as necessary.   Current medication list and allergy/intolerance information reviewed:    Current Outpatient Medications  Medication Sig Dispense Refill  . cetirizine (ZYRTEC) 10 MG tablet Take 1 tablet (10 mg total) by mouth at bedtime. 90 tablet 1  . denosumab (PROLIA) 60 MG/ML SOSY injection Inject into the skin.    Marland Kitchen esomeprazole (NEXIUM) 40 MG capsule Take 1 capsule (40 mg total) by mouth daily. 90 capsule 3  . Estradiol (IMVEXXY MAINTENANCE PACK) 10 MCG INST Place 10 mcg vaginally 2 (two) times a week. 8 each 11  . hydrochlorothiazide (HYDRODIURIL) 25 MG tablet Take 1 tablet (25 mg total) by mouth daily. As need for swelling 90 tablet 0  . levothyroxine (SYNTHROID, LEVOTHROID) 75 MCG tablet Take 1 tablet (75 mcg total) by mouth daily before breakfast. Take on Days 1-6 and skip Day 7 90 tablet 3  .  montelukast (SINGULAIR) 10 MG tablet Take 1 tablet (10 mg total) by mouth at bedtime. 90 tablet 1  . naproxen sodium (ALEVE) 220 MG tablet Take by mouth.    Marland Kitchen NIFEdipine (PROCARDIA-XL/NIFEDICAL-XL) 30 MG 24 hr tablet Take 1 tablet (30 mg total) by mouth daily. 90 tablet 3  . nitroGLYCERIN (NITROSTAT) 0.4 MG SL tablet Place 1 tablet (0.4 mg total) under the tongue every 5 (five) minutes as needed for chest pain. 20 tablet 1  . Vitamin D, Ergocalciferol, (DRISDOL) 1.25 MG (50000 UT) CAPS capsule     . amoxicillin-clavulanate (AUGMENTIN) 875-125 MG tablet Take 1 tablet by mouth 2 (two) times daily for 7 days. Fill and take only if symptoms persist longer than 10 days 14 tablet 0  . brompheniramine-pseudoephedrine-DM 30-2-10 MG/5ML syrup Take 2.5 mLs by mouth 4 (four) times daily as needed. 118 mL 0  . ipratropium (ATROVENT) 0.06 % nasal spray Place 2 sprays into both nostrils 4 (four) times daily. As needed for severe sinus congestion 15 mL 1  . predniSONE (DELTASONE) 20 MG tablet Take 1 tablet (20 mg total) by mouth 2 (two) times daily with a meal. 10 tablet 0  . SUMAtriptan (IMITREX) 100 MG tablet Take 0.5-1 tablets (50-100 mg total) by mouth every 2 (two) hours as needed for migraine (Max 2 pills per 24 hours). (Patient not taking: Reported on 12/17/2018) 10 tablet 3   No current facility-administered medications for this visit.     Allergies  Allergen Reactions  . Sulfamethoxazole-Trimethoprim Rash  . Tetracyclines & Related Rash      Review of Systems:  Constitutional:  No  fever, no chills, +recent illness, No unintentional weight changes. +significant fatigue.  HEENT: No  headache, no vision change, no hearing change, +sore throat, +sinus pressure  Cardiac: No  chest pain, No  pressure, No palpitations  Respiratory:  No  shortness of breath. +Cough  Gastrointestinal: No  abdominal pain, No  nausea, No  vomiting,  No  blood in stool, No  diarrhea  Musculoskeletal: No new  myalgia/arthralgia  Skin: No  Rash  Neurologic: No  weakness, No  dizziness  Exam:  BP 115/80 (Patient Position: Sitting, Cuff Size: Normal)   Pulse 100   Temp (!) 97 F (36.1 C) (Oral)   Wt 145 lb (65.8 kg)   Constitutional: VS see above. General Appearance: alert, well-developed, well-nourished, NAD  Eyes: Normal lids and conjunctive, non-icteric sclera  Ears, Nose, Mouth, Throat: MMM, Normal external inspection ears/nares/mouth/lips/gums.   Neck: No masses, trachea midline.   Respiratory: Normal respiratory effort.  Psychiatric: Normal judgment/insight. Normal mood and affect.     ASSESSMENT/PLAN: The encounter diagnosis was Acute non-recurrent sinusitis, unspecified location.  Meds ordered this encounter  Medications  . brompheniramine-pseudoephedrine-DM 30-2-10 MG/5ML syrup    Sig: Take 2.5 mLs by mouth 4 (four) times daily as needed.    Dispense:  118 mL    Refill:  0  . predniSONE (DELTASONE) 20 MG tablet    Sig: Take 1 tablet (20 mg total) by mouth 2 (two) times daily with a meal.    Dispense:  10 tablet    Refill:  0  . ipratropium (ATROVENT) 0.06 % nasal spray    Sig: Place 2 sprays into both nostrils 4 (four) times daily. As needed for severe sinus congestion    Dispense:  15 mL    Refill:  1  . amoxicillin-clavulanate (AUGMENTIN) 875-125 MG tablet    Sig: Take 1 tablet by mouth 2 (two) times daily for 7 days. Fill and take only if symptoms persist longer than 10 days    Dispense:  14 tablet    Refill:  0     Patient Instructions  Medications & Home Remedies for Upper Respiratory Illness   Note: the following list assumes no pregnancy, normal liver & kidney function and no other drug interactions. Dr. Sheppard Coil has highlighted medications which are safe for you to use, but these may not be appropriate for everyone. Always ask a pharmacist or qualified medical provider if you have any questions!    Aches/Pains, Fever, Headache OTC Acetaminophen  (Tylenol) 500 mg tablets - take max 2 tablets (1000 mg) every 6 hours (4 times per day)    Sinus Congestion Prescription Atrovent as directed OTC Nasal Saline if desired to rinse OTC Oxymetolazone (Afrin, others) sparing use due to rebound congestion, NEVER use in kids OTC Phenylephrine (Sudafed) 10 mg tablets every 4 hours (or the 12-hour formulation) OTC Diphenhydramine (Benadryl) 25 mg tablets - take max 2 tablets every 4 hours   Cough & Sore Throat Prescription cough pills or syrups as directed OTC Dextromethorphan (Robitussin, others) - cough suppressant OTC Guaifenesin (Robitussin, Mucinex, others) - expectorant (helps cough up mucus) (Dextromethorphan and Guaifenesin also come in a combination tablet/syrup) OTC Lozenges w/ Benzocaine + Menthol (Cepacol) Honey - as much as you want! Teas which "coat the throat" - look for ingredients Elm Bark, Licorice Root, Marshmallow Root   Other Prescription Oral Steroids to decrease inflammation Prescription Antibiotics if these are necessary for bacterial infection - take ALL, even if you're feeling better  OTC Zinc Lozenges within 24 hours of symptoms onset - mixed evidence this shortens the  duration of the common cold Don't waste your money on Vitamin C or Echinacea in acute illness - it's already too late!          Visit summary with medication list and pertinent instructions was printed for patient to review. All questions at time of visit were answered - patient instructed to contact office with any additional concerns or updates. ER/RTC precautions were reviewed with the patient.   Follow-up plan: Return if symptoms worsen or fail to improve.    Please note: voice recognition software was used to produce this document, and typos may escape review. Please contact Dr. Sheppard Coil for any needed clarifications.

## 2018-12-31 ENCOUNTER — Encounter: Payer: Self-pay | Admitting: Osteopathic Medicine

## 2019-02-22 DIAGNOSIS — I10 Essential (primary) hypertension: Secondary | ICD-10-CM | POA: Diagnosis not present

## 2019-02-22 DIAGNOSIS — R0789 Other chest pain: Secondary | ICD-10-CM | POA: Diagnosis not present

## 2019-02-22 DIAGNOSIS — E559 Vitamin D deficiency, unspecified: Secondary | ICD-10-CM | POA: Diagnosis not present

## 2019-02-22 DIAGNOSIS — M810 Age-related osteoporosis without current pathological fracture: Secondary | ICD-10-CM | POA: Diagnosis not present

## 2019-02-22 DIAGNOSIS — F430 Acute stress reaction: Secondary | ICD-10-CM | POA: Diagnosis not present

## 2019-02-22 DIAGNOSIS — G43909 Migraine, unspecified, not intractable, without status migrainosus: Secondary | ICD-10-CM | POA: Diagnosis not present

## 2019-02-22 DIAGNOSIS — E039 Hypothyroidism, unspecified: Secondary | ICD-10-CM | POA: Diagnosis not present

## 2019-02-22 DIAGNOSIS — F411 Generalized anxiety disorder: Secondary | ICD-10-CM | POA: Diagnosis not present

## 2019-02-22 DIAGNOSIS — J309 Allergic rhinitis, unspecified: Secondary | ICD-10-CM | POA: Diagnosis not present

## 2019-02-22 DIAGNOSIS — K219 Gastro-esophageal reflux disease without esophagitis: Secondary | ICD-10-CM | POA: Diagnosis not present

## 2019-02-23 LAB — LIPID PANEL
Cholesterol: 241 mg/dL — ABNORMAL HIGH (ref <200)
HDL: 61 mg/dL (ref >=50)
LDL Cholesterol (Calc): 153 mg/dL (calc) — ABNORMAL HIGH
Non-HDL Cholesterol (Calc): 180 mg/dL (calc) — ABNORMAL HIGH (ref <130)
TRIGLYCERIDES: 147 mg/dL (ref <150)
Total CHOL/HDL Ratio: 4 (calc) (ref <5.0)

## 2019-02-23 LAB — COMPLETE METABOLIC PANEL WITH GFR
AG Ratio: 1.9 (calc) (ref 1.0–2.5)
ALKALINE PHOSPHATASE (APISO): 67 U/L (ref 37–153)
ALT: 17 U/L (ref 6–29)
AST: 25 U/L (ref 10–35)
Albumin: 4.3 g/dL (ref 3.6–5.1)
BUN/Creatinine Ratio: 17 (calc) (ref 6–22)
BUN: 17 mg/dL (ref 7–25)
CO2: 26 mmol/L (ref 20–32)
Calcium: 9.5 mg/dL (ref 8.6–10.4)
Chloride: 107 mmol/L (ref 98–110)
Creat: 0.99 mg/dL — ABNORMAL HIGH (ref 0.60–0.93)
GFR, Est African American: 65 mL/min/{1.73_m2} (ref >=60)
GFR, Est Non African American: 56 mL/min/{1.73_m2} — ABNORMAL LOW (ref >=60)
Globulin: 2.3 g/dL (calc) (ref 1.9–3.7)
Glucose, Bld: 89 mg/dL (ref 65–99)
Potassium: 5.2 mmol/L (ref 3.5–5.3)
Sodium: 142 mmol/L (ref 135–146)
TOTAL PROTEIN: 6.6 g/dL (ref 6.1–8.1)
Total Bilirubin: 0.7 mg/dL (ref 0.2–1.2)

## 2019-02-23 LAB — CBC
HCT: 44.8 % (ref 35.0–45.0)
Hemoglobin: 15 g/dL (ref 11.7–15.5)
MCH: 31.6 pg (ref 27.0–33.0)
MCHC: 33.5 g/dL (ref 32.0–36.0)
MCV: 94.3 fL (ref 80.0–100.0)
MPV: 10.3 fL (ref 7.5–12.5)
Platelets: 253 10*3/uL (ref 140–400)
RBC: 4.75 10*6/uL (ref 3.80–5.10)
RDW: 12.8 % (ref 11.0–15.0)
WBC: 5.3 10*3/uL (ref 3.8–10.8)

## 2019-02-23 LAB — VITAMIN D 25 HYDROXY (VIT D DEFICIENCY, FRACTURES): VIT D 25 HYDROXY: 36 ng/mL (ref 30–100)

## 2019-02-23 LAB — TSH: TSH: 1.69 mIU/L (ref 0.40–4.50)

## 2019-03-01 ENCOUNTER — Ambulatory Visit: Payer: 59

## 2019-03-01 ENCOUNTER — Encounter: Payer: Self-pay | Admitting: Osteopathic Medicine

## 2019-03-01 ENCOUNTER — Telehealth: Payer: Self-pay

## 2019-03-01 ENCOUNTER — Ambulatory Visit (INDEPENDENT_AMBULATORY_CARE_PROVIDER_SITE_OTHER): Payer: Medicare Other | Admitting: Osteopathic Medicine

## 2019-03-01 VITALS — BP 117/75 | HR 81 | Temp 97.5°F | Wt 142.0 lb

## 2019-03-01 DIAGNOSIS — I10 Essential (primary) hypertension: Secondary | ICD-10-CM

## 2019-03-01 DIAGNOSIS — M791 Myalgia, unspecified site: Secondary | ICD-10-CM

## 2019-03-01 DIAGNOSIS — Z1159 Encounter for screening for other viral diseases: Secondary | ICD-10-CM | POA: Diagnosis not present

## 2019-03-01 DIAGNOSIS — R202 Paresthesia of skin: Secondary | ICD-10-CM

## 2019-03-01 DIAGNOSIS — M25561 Pain in right knee: Secondary | ICD-10-CM | POA: Diagnosis not present

## 2019-03-01 DIAGNOSIS — E039 Hypothyroidism, unspecified: Secondary | ICD-10-CM

## 2019-03-01 DIAGNOSIS — M810 Age-related osteoporosis without current pathological fracture: Secondary | ICD-10-CM

## 2019-03-01 DIAGNOSIS — E559 Vitamin D deficiency, unspecified: Secondary | ICD-10-CM

## 2019-03-01 DIAGNOSIS — M25562 Pain in left knee: Secondary | ICD-10-CM

## 2019-03-01 DIAGNOSIS — G8929 Other chronic pain: Secondary | ICD-10-CM

## 2019-03-01 DIAGNOSIS — Z0000 Encounter for general adult medical examination without abnormal findings: Secondary | ICD-10-CM | POA: Diagnosis not present

## 2019-03-01 MED ORDER — DENOSUMAB 60 MG/ML ~~LOC~~ SOSY
60.0000 mg | PREFILLED_SYRINGE | Freq: Once | SUBCUTANEOUS | Status: AC
  Administered 2019-03-01: 60 mg via SUBCUTANEOUS

## 2019-03-01 MED ORDER — DENOSUMAB 60 MG/ML ~~LOC~~ SOSY: 60.0000 mg | PREFILLED_SYRINGE | Freq: Once | SUBCUTANEOUS | 0 refills | Status: DC

## 2019-03-01 NOTE — Telephone Encounter (Signed)
She was instructed to have then run it w/ [facility] it should not be expensive. Can we call the pharmacy back and see if they can do this? Thanks.

## 2019-03-01 NOTE — Progress Notes (Signed)
HPI: Cindy [name] is a 73 y.o. female  who presents to [place] today, 03/01/19,  for Medicare Annual Wellness Exam  Patient presents for annual physical/Medicare wellness exam. No other complaints or questions today.   Past medical, surgical, social and family history reviewed:  Patient Active Problem List   Diagnosis Date Noted  . Hypertension 10/30/2018  . Atypical chest pain 10/30/2018  . Migraine 10/30/2018  . Hypothyroidism 10/30/2018  . Osteoporosis 10/30/2018  . GERD (gastroesophageal reflux disease) 10/30/2018  . Anxiety in acute stress reaction 10/30/2018  . Allergic rhinitis 10/30/2018  . Vitamin D deficiency 10/30/2018  . 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    Past Surgical History:  Procedure Laterality Date  . HYSTERECTOMY ABDOMINAL WITH SALPINGECTOMY      Social History   Socioeconomic History  . Marital status: Married    Spouse name: Not on file  . Number of children: Not on file  . Years of education: Not on file  . Highest education level: Not on file  Occupational History  . Not on file  Social Needs  . Financial resource strain: Not on file  . Food insecurity    Worry: Not on file    Inability: Not on file  . Transportation needs    Medical: Not on file    Non-medical: Not on file  Tobacco Use  . Smoking status: Never Smoker  . Smokeless tobacco: Never Used  Substance and Sexual Activity  . Alcohol use: Never    Frequency: Never  . Drug use: Never  . Sexual activity: Yes    Partners: Male  Lifestyle  . Physical activity    Days per week: Not on file    Minutes per session: Not on file  . Stress: Not on file  Relationships  . Social Herbalist on phone: Not on file    Gets together: Not on file    Attends religious service: Not on file    Active member of club or organization: Not on file    Attends meetings of clubs or organizations: Not on file    Relationship  status: Not on file  . Intimate partner violence    Fear of current or ex partner: Not on file    Emotionally abused: Not on file    Physically abused: Not on file    Forced sexual activity: Not on file  Other Topics Concern  . Not on file  Social History Narrative  . Not on file    Family History  Problem Relation Age of Onset  . High blood pressure Mother   . Breast cancer Mother   . Heart attack Father   . Prostate cancer Brother   . Leukemia Maternal Grandfather   . Breast cancer Maternal Aunt   . Breast cancer Maternal Aunt      Current medication list and allergy/intolerance information reviewed:    Outpatient Encounter Medications as of 03/01/2019  Medication Sig Note  . cetirizine (ZYRTEC) 10 MG tablet Take 1 tablet (10 mg total) by mouth at bedtime.   Marland Kitchen denosumab (PROLIA) 60 MG/ML SOSY injection Inject into the skin.   Marland Kitchen esomeprazole (NEXIUM) 40 MG capsule Take 1 capsule (40 mg total) by mouth daily.   . Estradiol (IMVEXXY MAINTENANCE PACK) 10 MCG INST Place 10 mcg vaginally 2 (two) times a week.   . hydrochlorothiazide (HYDRODIURIL) 25 MG tablet Take 1 tablet (25 mg total) by mouth daily. As need for swelling   .  levothyroxine (SYNTHROID, LEVOTHROID) 75 MCG tablet Take 1 tablet (75 mcg total) by mouth daily before breakfast. Take on Days 1-6 and skip Day 7   . montelukast (SINGULAIR) 10 MG tablet Take 1 tablet (10 mg total) by mouth at bedtime.   . naproxen sodium (ALEVE) 220 MG tablet Take by mouth. 12/17/2018: PRN  . NIFEdipine (PROCARDIA-XL/NIFEDICAL-XL) 30 MG 24 hr tablet Take 1 tablet (30 mg total) by mouth daily.   . nitroGLYCERIN (NITROSTAT) 0.4 MG SL tablet Place 1 tablet (0.4 mg total) under the tongue every 5 (five) minutes as needed for chest pain. 12/17/2018: PRN  . SUMAtriptan (IMITREX) 100 MG tablet Take 0.5-1 tablets (50-100 mg total) by mouth every 2 (two) hours as needed for migraine (Max 2 pills per 24 hours).   . brompheniramine-pseudoephedrine-DM  30-2-10 MG/5ML syrup Take 2.5 mLs by mouth 4 (four) times daily as needed. (Patient not taking: Reported on 03/01/2019)   . ipratropium (ATROVENT) 0.06 % nasal spray Place 2 sprays into both nostrils 4 (four) times daily. As needed for severe sinus congestion (Patient not taking: Reported on 03/01/2019)   . predniSONE (DELTASONE) 20 MG tablet Take 1 tablet (20 mg total) by mouth 2 (two) times daily with a meal. (Patient not taking: Reported on 03/01/2019)   . Vitamin D, Ergocalciferol, (DRISDOL) 1.25 MG (50000 UT) CAPS capsule     No facility-administered encounter medications on file as of 03/01/2019.     Allergies  Allergen Reactions  . Sulfamethoxazole-Trimethoprim Rash  . Tetracyclines & Related Rash       Review of Systems: Review of Systems - Negative    Medicare Wellness Questionnaire  Are there smokers in your home (other than you)? no  Depression Screen (Note: if answer to either of the following is "Yes", a more complete depression screening is indicated)   Q1: Over the past two weeks, have you felt down, depressed or hopeless? no  Q2: Over the past two weeks, have you felt little interest or pleasure in doing things? no  Have you lost interest or pleasure in daily life? no  Do you often feel hopeless? no  Do you cry easily over simple problems? no  Activities of Daily Living In your present state of health, do you have any difficulty performing the following activities?:  Driving? no Managing money?  no Feeding yourself? no Getting from bed to chair? no Climbing a flight of stairs? yes Preparing food and eating?: no Bathing or showering? no Getting dressed: no Getting to the toilet? no Using the toilet: no Moving around from place to place: yes In the past year have you fallen or had a near fall?: no  Hearing Difficulties:  Do you often ask people to speak up or repeat themselves? no Do you experience ringing or noises in your ears? yes  Do you have difficulty  understanding soft or whispered voices? yes  Memory Difficulties:  Do you feel that you have a problem with memory? no  Do you often misplace items? yes  Do you feel safe at home?  yes  Sexual Health:   Are you sexually active?  No  Do you have more than one partner?  No  Advanced Directives:   Advanced directives discussed: has an advanced directive - a copy HAS NOT been provided.  Additional information provided: no  Risk Factors  Current exercise habits: active gardener   Dietary issues discussed:no concerns   Cardiac risk factors: hypertension, positive family history  No other specialists /  medical providers  Was previously seeing endocrinology    Exam:  BP (!) 138/96 (BP Location: Left Arm, Patient Position: Sitting, Cuff Size: Normal)   Pulse (!) 102   Temp (!) 97.5 F (36.4 C) (Oral)   Wt 142 lb (64.4 kg)  Vision by Snellen chart: right eye:see nurse notes, left eye:see nurse notes  Constitutional: VS see above. General Appearance: alert, well-developed, well-nourished, NAD  Ears, Nose, Mouth, Throat: MMM  Neck: No masses, trachea midline.   Respiratory: Normal respiratory effort. no wheeze, no rhonchi, no rales  Cardiovascular:No lower extremity edema.   Musculoskeletal: Gait normal. No clubbing/cyanosis of digits.   Neurological: Normal balance/coordination. No tremor. Recalls 3 objects and able to read face of watch with correct time.   Skin: warm, dry, intact. No rash/ulcer.   Psychiatric: Normal judgment/insight. Normal mood and affect. Oriented x3.     ASSESSMENT/PLAN:   Encounter for Medicare annual wellness exam    CANCER SCREENING  Lung - USPSTF: 62-83TD w/ 30 py hx unless quit w/in 50yr - does not need  Colon - need records, pt declines  Prostate - does not need  Breast - does not need  Cervical - does not need OTHER DISEASE SCREENING  Lipid - needs  DM2 - needs  AAA - female 75-75yo ever smoked - does not need  Osteoporosis  - women 75yo+, men 75yo+ - does not need INFECTIOUS DISEASE SCREENING  HIV - does not need  GC/CT - does not need  HepC -needs  TB - does not need ADULT VACCINATION  Influenza - annual vaccine recommended  Td - booster every 10 years - needs 2025  Zoster - option at 33, yes at 25+ - needs  PCV13 - needs  PPSV23 - needs Immunization History  Administered Date(s) Administered  . Tdap 09/09/2013   OTHER  Fall - exercise and Vit D age 29+ - needs  Advanced Directives -  Discussed as above  Patient Instructions  Knee/Muscle pain:  Continue the taping as you have been doing  Take Aleve as directed, can combine this with acetaminophen/Tylenol.   I also sent prescription for diclofenac/Voltaren gel.   Would strongly advise follow-up with Dr. Georgina Snell or Dr. Darene Lamer, one of our sports medicine doctors, if needed or if we see anything on x-ray.   General Preventive Care  Most recent routine screening lipids/other labs: ordered today  Everyone should have blood pressure checked once per year.   Tobacco: don't!   Alcohol: responsible moderation is ok for most adults - if you have concerns about your alcohol intake, please talk to me!   Exercise: as tolerated to reduce risk of cardiovascular disease and diabetes. Strength training will also prevent osteoporosis.   Mental health: if need for mental health care (medicines, counseling, other), or concerns about moods, please let me know!   Sexual health: if need for STD testing, or if concerns with libido/pain problems, please let me know!   Advanced Directive: Living Will and/or Healthcare Power of Attorney recommended for all adults, regardless of age or health.  Vaccines  Flu vaccine: recommended for everyone, every fall.   Shingles vaccine: Shingrix recommended after age 38, we do not have this on record for you.   Pneumonia vaccines: Prevnar and Pneumovax recommended after age 25, we do not have these on record for you.    Tetanus booster: Tdap recommended every 10 years. Last one was 2015 per your report  Cancer screenings   Colon cancer screening: recommended for  everyone at age 94-75. We do not have previous records on file, will request those.   Breast cancer screening: mammogram recommended annually age 64-75. Evidence to support continued screening after 75 is limited, but continued screening is certainly an option.   Cervical cancer screening: Can usually stop Pap tests at age 73 or w/ hysterectomy.   Lung cancer screening: not needed for non-smokers.  Infection screenings . HIV: recommended screening at least once age 47-65, more often as needed. . Gonorrhea/Chlamydia: screening as needed . Hepatitis C: recommended once for anyone born 65-1965 . TB: certain at-risk populations, or depending on work requirements and/or travel history Other . Bone Density Test: recommended for women every 2 years after age 30. Due 2021.     During the course of the visit the patient was educated and counseled about appropriate screening and preventive services as noted above.   Patient Instructions (the written plan) was given to the patient.  Medicare Attestation I have personally reviewed: The patient's medical and social history Their use of alcohol, tobacco or illicit drugs Their current medications and supplements The patient's functional ability including ADLs,fall risks, home safety risks, cognitive, and hearing and visual impairment Diet and physical activities Evidence for depression or mood disorders  The patient's weight, height, BMI, and visual acuity have been recorded in the chart.  I have made referrals, counseling, and provided education to the patient based on review of the above and I have provided the patient with a written personalized care plan for preventive services.     Emeterio Reeve, DO   03/01/19   Visit summary with medication list and pertinent instructions was printed  for patient to review. All questions at time of visit were answered - patient instructed to contact office with any additional concerns. ER/RTC precautions were reviewed with the patient. Follow-up plan: No follow-ups on file.

## 2019-03-01 NOTE — Telephone Encounter (Signed)
Tried contacting the pharmacy regarding provider's note. No answer, staff currently out to lunch. Gave pt a call instead, as per pt, pharmacy did run Goodrx coupon. The rx was $1500 originally & w/discount it went down to $600 OOP. Pt is requesting an alternative rx to be sent. Pls advise.

## 2019-03-01 NOTE — Telephone Encounter (Signed)
Pt went to the pharmacy to pick up diclofenac gel. Unable to get rx. OOP cost will be over $600, insurance will not cover med. Requesting an alternative. Pls advise, thanks.

## 2019-03-01 NOTE — Patient Instructions (Addendum)
Knee/Muscle pain:  Continue the taping as you have been doing  Take Aleve as directed, can combine this with acetaminophen/Tylenol.   I also sent prescription for diclofenac/Voltaren gel.   Would strongly advise follow-up with Dr. Georgina Snell or Dr. Darene Lamer, one of our sports medicine doctors, if needed or if we see anything on x-ray.   General Preventive Care  Most recent routine screening lipids/other labs: ordered today  Everyone should have blood pressure checked once per year.   Tobacco: don't!   Alcohol: responsible moderation is ok for most adults - if you have concerns about your alcohol intake, please talk to me!   Exercise: as tolerated to reduce risk of cardiovascular disease and diabetes. Strength training will also prevent osteoporosis.   Mental health: if need for mental health care (medicines, counseling, other), or concerns about moods, please let me know!   Sexual health: if need for STD testing, or if concerns with libido/pain problems, please let me know!   Advanced Directive: Living Will and/or Healthcare Power of Attorney recommended for all adults, regardless of age or health.  Vaccines  Flu vaccine: recommended for everyone, every fall.   Shingles vaccine: Shingrix recommended after age 69, we do not have this on record for you.   Pneumonia vaccines: Prevnar and Pneumovax recommended after age 15, we do not have these on record for you.   Tetanus booster: Tdap recommended every 10 years. Last one was 2015 per your report  Cancer screenings   Colon cancer screening: recommended for everyone at age 36-75. We do not have previous records on file, will request those.   Breast cancer screening: mammogram recommended annually age 33-75. Evidence to support continued screening after 75 is limited, but continued screening is certainly an option.   Cervical cancer screening: Can usually stop Pap tests at age 19 or w/ hysterectomy.   Lung cancer screening: not needed for  non-smokers.  Infection screenings . HIV: recommended screening at least once age 42-65, more often as needed. . Gonorrhea/Chlamydia: screening as needed . Hepatitis C: recommended once for anyone born 85-1965 . TB: certain at-risk populations, or depending on work requirements and/or travel history Other . Bone Density Test: recommended for women every 2 years after age 51. Due 2021.

## 2019-03-02 LAB — CK: Total CK: 73 U/L (ref 29–143)

## 2019-03-02 LAB — HEPATITIS C ANTIBODY
Hepatitis C Ab: NONREACTIVE
SIGNAL TO CUT-OFF: 0.01 (ref <1.00)

## 2019-03-02 LAB — VITAMIN B12: Vitamin B-12: 746 pg/mL (ref 200–1100)

## 2019-03-04 MED ORDER — DICLOFENAC SODIUM 1 % TD GEL: 4.0000 g | Freq: Four times a day (QID) | TRANSDERMAL | 11 refills | Status: DC

## 2019-03-04 NOTE — Telephone Encounter (Signed)
I called the pharmacy and they state that it was not the Diclofenac that was $600, it was her Prolia.   They ran the RX for Diclofenac that was sent today with a good RX and it was $21.80.  Pt advised, will go get RX. No further needs at this time.

## 2019-03-04 NOTE — Telephone Encounter (Signed)
[facility] GoodRx coupon states it's $19.80. I sent it again.

## 2019-03-04 NOTE — Telephone Encounter (Signed)
Noted thanks °

## 2019-06-19 ENCOUNTER — Encounter: Payer: Self-pay | Admitting: Osteopathic Medicine

## 2019-06-21 ENCOUNTER — Encounter: Payer: Self-pay | Admitting: Osteopathic Medicine

## 2019-06-23 ENCOUNTER — Other Ambulatory Visit: Payer: Self-pay

## 2019-06-23 ENCOUNTER — Encounter: Payer: Self-pay | Admitting: Sports Medicine

## 2019-06-23 ENCOUNTER — Ambulatory Visit (INDEPENDENT_AMBULATORY_CARE_PROVIDER_SITE_OTHER): Payer: Medicare Other | Admitting: Sports Medicine

## 2019-06-23 ENCOUNTER — Ambulatory Visit (INDEPENDENT_AMBULATORY_CARE_PROVIDER_SITE_OTHER): Payer: Medicare Other

## 2019-06-23 DIAGNOSIS — M25562 Pain in left knee: Secondary | ICD-10-CM

## 2019-06-23 DIAGNOSIS — M1711 Unilateral primary osteoarthritis, right knee: Secondary | ICD-10-CM

## 2019-06-23 DIAGNOSIS — M19041 Primary osteoarthritis, right hand: Secondary | ICD-10-CM | POA: Diagnosis not present

## 2019-06-23 DIAGNOSIS — M79641 Pain in right hand: Secondary | ICD-10-CM | POA: Diagnosis not present

## 2019-06-23 DIAGNOSIS — M65312 Trigger thumb, left thumb: Secondary | ICD-10-CM | POA: Diagnosis not present

## 2019-06-23 DIAGNOSIS — M170 Bilateral primary osteoarthritis of knee: Secondary | ICD-10-CM | POA: Diagnosis not present

## 2019-06-23 DIAGNOSIS — M79642 Pain in left hand: Secondary | ICD-10-CM

## 2019-06-23 DIAGNOSIS — M19042 Primary osteoarthritis, left hand: Secondary | ICD-10-CM

## 2019-06-23 DIAGNOSIS — M2550 Pain in unspecified joint: Secondary | ICD-10-CM | POA: Diagnosis not present

## 2019-06-23 DIAGNOSIS — M1712 Unilateral primary osteoarthritis, left knee: Secondary | ICD-10-CM | POA: Diagnosis not present

## 2019-06-23 MED ORDER — CELECOXIB 200 MG PO CAPS: ORAL_CAPSULE | ORAL | 2 refills | Status: DC

## 2019-06-23 NOTE — Assessment & Plan Note (Signed)
Formal physical therapy, if no better in 6 weeks we can do a trigger thumb injection into the flexor pollicis longus tendon sheath.

## 2019-06-23 NOTE — Progress Notes (Signed)
Subjective:    CC: Aches and pains  HPI: .Is a pleasant 73 year old female, for years she has had aches and pains in both knees, both hands at the PIPs and DIPs, she has started to develop triggering of her left thumb, symptoms are moderate, persistent.  She has had a couple injections into her knees in the distant past.  She is concerned about rheumatoid arthritis.  I reviewed the past medical history, family history, social history, surgical history, and allergies today and no changes were needed.  Please see the problem list section below in epic for further details.  Past Medical History: Past Medical History:  Diagnosis Date  . Abnormal mammogram   . Allergic rhinitis 10/30/2018  . Anxiety   . Anxiety in acute stress reaction 10/30/2018  . Atypical chest pain 10/30/2018  . Colon polyps   . GERD (gastroesophageal reflux disease) 10/30/2018  . High blood pressure   . Hypertension 10/30/2018  . Hyperthyroidism   . Hypothyroidism 10/30/2018  . Migraine 10/30/2018  . Osteoporosis 10/30/2018  . 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  . Vitamin D deficiency 10/30/2018   Past Surgical History: Past Surgical History:  Procedure Laterality Date  . HYSTERECTOMY ABDOMINAL WITH SALPINGECTOMY     Social History: Social History   Socioeconomic History  . Marital status: Married    Spouse name: Not on file  . Number of children: Not on file  . Years of education: Not on file  . Highest education level: Not on file  Occupational History  . Not on file  Social Needs  . Financial resource strain: Not on file  . Food insecurity    Worry: Not on file    Inability: Not on file  . Transportation needs    Medical: Not on file    Non-medical: Not on file  Tobacco Use  . Smoking status: Never Smoker  . Smokeless tobacco: Never Used  Substance and Sexual Activity  . Alcohol use: Never    Frequency: Never  . Drug use: Never  . Sexual activity: Yes    Partners: Male  Lifestyle  .  Physical activity    Days per week: Not on file    Minutes per session: Not on file  . Stress: Not on file  Relationships  . Social Herbalist on phone: Not on file    Gets together: Not on file    Attends religious service: Not on file    Active member of club or organization: Not on file    Attends meetings of clubs or organizations: Not on file    Relationship status: Not on file  Other Topics Concern  . Not on file  Social History Narrative  . Not on file   Family History: Family History  Problem Relation Age of Onset  . High blood pressure Mother   . Breast cancer Mother   . Heart attack Father   . Prostate cancer Brother   . Leukemia Maternal Grandfather   . Breast cancer Maternal Aunt   . Breast cancer Maternal Aunt    Allergies: Allergies  Allergen Reactions  . Sulfamethoxazole-Trimethoprim Rash  . Tetracyclines & Related Rash   Medications: See med rec.  Review of Systems: No fevers, chills, night sweats, weight loss, chest pain, or shortness of breath.   Objective:    General: Well Developed, well nourished, and in no acute distress.  Neuro: Alert and oriented x3, extra-ocular muscles intact, sensation grossly intact.  HEENT: Normocephalic, atraumatic, pupils  equal round reactive to light, neck supple, no masses, no lymphadenopathy, thyroid nonpalpable.  Skin: Warm and dry, no rashes. Cardiac: Regular rate and rhythm, no murmurs rubs or gallops, no lower extremity edema.  Respiratory: Clear to auscultation bilaterally. Not using accessory muscles, speaking in full sentences. Knees: Tender to palpation at the joint lines, minimal swelling. Hands: Bilateral Bouchard and Heberden nodes, triggering of the left thumb at the flexor pollicis longus  Impression and Recommendations:    Polyarthralgia Checking a full rheumatoid work-up per patient request.  Primary osteoarthritis of both hands Bilateral x-rays, Celebrex, formal PT.  Primary  osteoarthritis of both knees Bilateral x-rays, formal PT, Celebrex.  Trigger thumb of left hand Formal physical therapy, if no better in 6 weeks we can do a trigger thumb injection into the flexor pollicis longus tendon sheath.   ___________________________________________ Gwen Her. Dianah Field, M.D., ABFM., CAQSM. Primary Care and Sports Medicine [facility]  Adjunct Professor of [employer]

## 2019-06-23 NOTE — Assessment & Plan Note (Signed)
Checking a full rheumatoid work-up per patient request.

## 2019-06-23 NOTE — Assessment & Plan Note (Signed)
Bilateral x-rays, Celebrex, formal PT.

## 2019-06-23 NOTE — Assessment & Plan Note (Signed)
Bilateral x-rays, formal PT, Celebrex.

## 2019-06-24 ENCOUNTER — Ambulatory Visit (INDEPENDENT_AMBULATORY_CARE_PROVIDER_SITE_OTHER): Payer: Medicare Other | Admitting: Physical Therapy

## 2019-06-24 ENCOUNTER — Encounter: Payer: Self-pay | Admitting: Physical Therapy

## 2019-06-24 ENCOUNTER — Other Ambulatory Visit: Payer: Self-pay

## 2019-06-24 DIAGNOSIS — M25542 Pain in joints of left hand: Secondary | ICD-10-CM

## 2019-06-24 DIAGNOSIS — M25541 Pain in joints of right hand: Secondary | ICD-10-CM

## 2019-06-24 DIAGNOSIS — M25562 Pain in left knee: Secondary | ICD-10-CM | POA: Diagnosis not present

## 2019-06-24 DIAGNOSIS — G8929 Other chronic pain: Secondary | ICD-10-CM | POA: Diagnosis not present

## 2019-06-24 DIAGNOSIS — M25561 Pain in right knee: Secondary | ICD-10-CM

## 2019-06-24 DIAGNOSIS — M6281 Muscle weakness (generalized): Secondary | ICD-10-CM

## 2019-06-24 NOTE — Therapy (Signed)
[place], [place], [address] Phone: [phone]   Fax:  [phone]  Physical Therapy Evaluation  Patient Details  Name: Cindy [name] MRN: [id number] Date of Birth: [birth year] Referring Provider (PT): Dr. Dianah Field   Encounter Date: 06/24/2019  PT End of Session - 06/24/19 0930    Visit Number  1    Number of Visits  12    Date for PT Re-Evaluation  08/05/19    PT Start Time  0930    PT Stop Time  1017    PT Time Calculation (min)  47 min    Activity Tolerance  Patient tolerated treatment well    Behavior During Therapy  [facility] for tasks assessed/performed       Past Medical History:  Diagnosis Date  . Abnormal mammogram   . Allergic rhinitis 10/30/2018  . Anxiety   . Anxiety in acute stress reaction 10/30/2018  . Atypical chest pain 10/30/2018  . Colon polyps   . GERD (gastroesophageal reflux disease) 10/30/2018  . High blood pressure   . Hypertension 10/30/2018  . Hyperthyroidism   . Hypothyroidism 10/30/2018  . Migraine 10/30/2018  . Osteoporosis 10/30/2018  . 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  . Vitamin D deficiency 10/30/2018    Past Surgical History:  Procedure Laterality Date  . HYSTERECTOMY ABDOMINAL WITH SALPINGECTOMY      There were no vitals filed for this visit.   Subjective Assessment - 06/24/19 0937    Subjective  Patient c/o bil hand pain beginning less than 6 months ago. she has difficulty opening jars and similar acivities. They hurt most in the morning. She has been dx with OA in bil knees. Her right knee gives out of times. She fell about 2 years ago. Her knee hurts all the time and she cannot sleep. Her left knee hurts some but not as bad. Harder to get up/down making gardening difficult.    Pertinent History  HTN, OA, OP, migraines    Limitations  Walking    How long can you walk comfortably?  walking uphill difficult;doesn't walk if hurting     Patient Stated Goals  to decreased pain in knees and hands    Currently in Pain?  Yes    Pain Score  4     Pain Location  Hand    Pain Orientation  Right;Left    Pain Descriptors / Indicators  Other (Comment);Sore   stiffness   Pain Type  Acute pain    Pain Onset  More than a month ago    Pain Frequency  Intermittent    Aggravating Factors   mornng, door knobs, jars    Pain Relieving Factors  voltarin, movement    Effect of Pain on Daily Activities  see above    Multiple Pain Sites  Yes    Pain Score  8    Pain Location  Knee    Pain Orientation  Right;Left    Pain Descriptors / Indicators  Aching;Sore    Pain Type  Chronic pain    Pain Radiating Towards  into lateral legs    Pain Onset  More than a month ago    Pain Frequency  Constant    Aggravating Factors   up/down, steps    Pain Relieving Factors  voltarin, KT tape    Effect of Pain on Daily Activities  limits gardening         [facility] PT Assessment - 06/24/19 0001  Assessment   Medical Diagnosis  bil OA hands and knees    Referring Provider (PT)  Dr. Dianah Field    Onset Date/Surgical Date  01/08/19    Hand Dominance  Right    Next MD Visit  6 weeks      Precautions   Precautions  Other (comment)    Precaution Comments  osteoporosis      Restrictions   Weight Bearing Restrictions  No      Balance Screen   Has the patient fallen in the past 6 months  No    Has the patient had a decrease in activity level because of a fear of falling?   No    Is the patient reluctant to leave their home because of a fear of falling?   No      Home Environment   Living Environment  Private residence    Living Arrangements  Spouse/significant other    Type of [place] to enter    Entrance Stairs-Number of Steps  5    Entrance Stairs-Rails  Can reach both      Prior Function   Level of [place]  Retired    Leisure  gardening      Cognition   Overall Cognitive  Status  Within Functional Limits for tasks assessed      Posture/Postural Control   Posture/Postural Control  Postural limitations    Postural Limitations  Rounded Shoulders;Forward head;Increased thoracic kyphosis    Posture Comments  genu varus      ROM / Strength   AROM / PROM / Strength  AROM;Strength      AROM   Overall AROM Comments  bil hands/fingers WFL, Full knee RoM bil      Strength   Overall Strength Comments  pounds; elbows bil 5/5    Strength Assessment Site  Hand;Wrist;Knee;Hip    Right/Left Wrist  Right;Left    Right Wrist Flexion  5/5    Right Wrist Extension  4+/5    Left Wrist Flexion  4+/5    Left Wrist Extension  4+/5    Right/Left hand  Right;Left    Right Hand Grip (lbs)  45/49/50    Right Hand Lateral Pinch  5 lbs    Left Hand Grip (lbs)  46/50/47    Left Hand Lateral Pinch  2 lbs    Right/Left Hip  Right;Left    Right Hip Flexion  4+/5    Right Hip Extension  4+/5    Right Hip ABduction  5/5    Left Hip Flexion  4+/5    Left Hip Extension  4+/5    Left Hip ABduction  4-/5    Right/Left Knee  Right;Left    Right Knee Flexion  4/5    Right Knee Extension  5/5    Left Knee Flexion  4-/5    Left Knee Extension  5/5      Palpation   Palpation comment  tender in bil gastroc, right HS, left quads, bil tib ant Rt>Lt      Transfers   Five time sit to stand comments   12 sec   first rep knee gave way Rt.               Objective measurements completed on examination: See above findings.              PT Education - 06/24/19 1025  Education Details  HEP    Person(s) Educated  Patient    Methods  Explanation;Demonstration;Handout    Comprehension  Verbalized understanding;Returned demonstration       PT Short Term Goals - 06/24/19 1037      PT SHORT TERM GOAL #1   Title  Ind with initial HEP    Time  2    Period  Weeks    Status  New    Target Date  07/08/19      PT SHORT TERM GOAL #2   Title  decreased hand  pain/stiffness by 50% with ADLS    Time  3    Period  Weeks    Status  New    Target Date  07/15/19        PT Long Term Goals - 06/24/19 1038      PT LONG TERM GOAL #1   Title  Pt to improve strength in hands to be able to open door knobs and perform other ADLS with [id number] less difficulty.    Time  6    Period  Weeks    Status  New    Target Date  08/05/19      PT LONG TERM GOAL #2   Title  Patient to report decreased pain/stiffness in hands by 75% or more to improve function.    Time  6    Period  Weeks    Status  New    Target Date  08/05/19      PT LONG TERM GOAL #3   Title  Patient to demo improved BLE strength to 4+/5 or better to improve function.    Time  6    Status  New      PT LONG TERM GOAL #4   Title  Patient able to get up/down to floor/ground without difficulty.    Time  6    Period  Weeks    Status  New      PT LONG TERM GOAL #5   Title  Patient to report decreased pain in bil knees with ADLS by [id number] or more.    Time  6    Period  Weeks    Status  New             Plan - 06/24/19 1027    Clinical Impression Statement  Patient presents with c/o of bil hand and knee pain. Right side worse for both. Her knees have hurt for years, but she is now having difficulty getting up from the floor and with gardening. She also reports the right knee gives out intermittently. She has full ROM, but tenderness in BLE and tight HS and gastrocs. Her hands began hurting in the past 6 months. They hurt mostly in the morning, but she has difficulty opening doors and jars. She has weakness in BLE and Bil hands and wrists. She has functional range of motion in her hands and wrists. She will benefit from PT to increase strength and decrease pain to improve function.    Personal Factors and Comorbidities  Age;Fitness;Comorbidity 3+    Comorbidities  HTN, OA, OP, migraines    Examination-Activity Limitations  Locomotion Level;Squat;Stairs    Examination-Participation  Restrictions  Tour manager   playing with grandchildren on floor   Stability/Clinical Decision Making  Stable/Uncomplicated    Clinical Decision Making  Low    Rehab Potential  Excellent    PT Frequency  2x / week    PT Duration  6  weeks    PT Treatment/Interventions  ADLs/Self Care Home Management;Cryotherapy;Electrical Stimulation;Iontophoresis 4mg /ml Dexamethasone;Moist Heat;Ultrasound;Therapeutic exercise;Neuromuscular re-education;Patient/family education;Dry needling;Manual techniques;Taping    PT Next Visit Plan  DN to hands, STW/manual to quads, lower legs, LE strengthening, putty or squeeze balls    PT Home Exercise Plan  [email or web address]    Consulted and Agree with Plan of Care  Patient       Patient will benefit from skilled therapeutic intervention in order to improve the following deficits and impairments:  Pain, Impaired UE functional use, Decreased activity tolerance, Impaired flexibility, Decreased strength, Postural dysfunction  Visit Diagnosis: Pain in joints of right hand - Plan: PT plan of care cert/re-cert  Pain in joints of left hand - Plan: PT plan of care cert/re-cert  Chronic pain of right knee - Plan: PT plan of care cert/re-cert  Chronic pain of left knee - Plan: PT plan of care cert/re-cert  Muscle weakness (generalized) - Plan: PT plan of care cert/re-cert     Problem List Patient Active Problem List   Diagnosis Date Noted  . 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/23/2019  . Primary osteoarthritis of both hands 06/23/2019  . Trigger thumb of left hand 06/23/2019  . Polyarthralgia 06/23/2019  . Hypertension 10/30/2018  . Atypical chest pain 10/30/2018  . Migraine 10/30/2018  . Hypothyroidism 10/30/2018  . Osteoporosis 10/30/2018  . GERD (gastroesophageal reflux disease) 10/30/2018  . Anxiety in acute stress reaction 10/30/2018  . Allergic rhinitis 10/30/2018  . Vitamin D deficiency 10/30/2018  . 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     Madelyn Flavors PT 06/24/2019, 10:50 AM  [facility] [place], [place], [address] Phone: [phone]   Fax:  [phone]  Name: Cindy [name] MRN: [id number] Date of Birth: Apr 26, 1946

## 2019-06-24 NOTE — Patient Instructions (Signed)
Access Code: [email or web address]  URL: [email or web address]  Date: 06/24/2019  Prepared by: Almyra Free Agnieszka Newhouse   Exercises Seated Hamstring Stretch - 3 reps - 1 sets - 30-60 sec hold - 2x daily - 7x weekly Gastroc Stretch on Wall - 3 reps - 1 sets - 60 sec hold - 2x daily - 7x weekly Patient Education Trigger Point Dry Needling

## 2019-06-28 LAB — RHEUMATOID ARTHRITIS DIAGNOSTIC PANEL, COMPREHENSIVE
Cyclic Citrullin Peptide Ab: <16 Units (ref <20)
Rheumatoid Factor (IgA): <5 U (ref <=6)
Rheumatoid Factor (IgG): <5 U (ref <=6)
Rheumatoid Factor (IgM): <5 U (ref <=6)
SSA (Ro) (ENA) Antibody, IgG: <1 AI
SSB (La) (ENA) Antibody, IgG: <1 AI

## 2019-06-28 LAB — ANA, IFA COMPREHENSIVE PANEL
Anti Nuclear Antibody (ANA): NEGATIVE
ENA SM Ab Ser-aCnc: <1 AI
SM/RNP: <1 AI
SSA (Ro) (ENA) Antibody, IgG: <1 AI
SSB (La) (ENA) Antibody, IgG: <1 AI
Scleroderma (Scl-70) (ENA) Antibody, IgG: <1 AI
ds DNA Ab: <1 IU/mL

## 2019-06-28 LAB — CK: Total CK: 69 U/L (ref 29–143)

## 2019-06-28 LAB — URIC ACID: Uric Acid, Serum: 4.8 mg/dL (ref 2.5–7.0)

## 2019-06-30 ENCOUNTER — Other Ambulatory Visit: Payer: Self-pay

## 2019-06-30 ENCOUNTER — Ambulatory Visit (INDEPENDENT_AMBULATORY_CARE_PROVIDER_SITE_OTHER): Payer: Medicare Other | Admitting: Physical Therapy

## 2019-06-30 ENCOUNTER — Encounter: Payer: Self-pay | Admitting: Physical Therapy

## 2019-06-30 DIAGNOSIS — M25542 Pain in joints of left hand: Secondary | ICD-10-CM | POA: Diagnosis not present

## 2019-06-30 DIAGNOSIS — G8929 Other chronic pain: Secondary | ICD-10-CM | POA: Diagnosis not present

## 2019-06-30 DIAGNOSIS — M25561 Pain in right knee: Secondary | ICD-10-CM

## 2019-06-30 DIAGNOSIS — M25541 Pain in joints of right hand: Secondary | ICD-10-CM

## 2019-06-30 DIAGNOSIS — M25562 Pain in left knee: Secondary | ICD-10-CM

## 2019-06-30 DIAGNOSIS — M6281 Muscle weakness (generalized): Secondary | ICD-10-CM | POA: Diagnosis not present

## 2019-06-30 NOTE — Patient Instructions (Signed)
Access Code: [email or web address]  URL: [email or web address]  Date: 06/30/2019  Prepared by: Kerin Perna   Exercises  Squat with Chair Touch - 10 reps - 1 sets - 1x daily - 7x weekly  Supine Bridge - 10 reps - 1 sets - 3 seconds hold - 1x daily - 7x weekly  Resisted Finger Extension and Thumb Abduction - 10 reps - 2 sets - 1x daily - 7x weekly  Quadriceps Stretch with Chair - 2 reps - 1 sets - 15 seconds hold - 1-2x daily - 7x weekly  Gastroc Stretch on Wall - 3 reps - 1 sets - 30 sec hold - 2x daily - 7x weekly  Hooklying Hamstring Stretch with Strap - 2-3 reps - 1 sets - 30-60 seconds hold - 2x daily - 7x weekly  Wrist Flexor Stretch in Pronation - 2-3 reps - 1 sets - 15-20 seconds hold - 1x daily - 7x weekly

## 2019-06-30 NOTE — Therapy (Signed)
[place], [place], [address] Phone: [phone]   Fax:  [phone]  Physical Therapy Treatment  Patient Details  Name: Cindy [name] MRN: [id number] Date of Birth: 1945-09-14 Referring Provider (PT): Dr. Dianah Field   Encounter Date: 06/30/2019  PT End of Session - 06/30/19 1018    Visit Number  2    Number of Visits  12    Date for PT Re-Evaluation  08/05/19    PT Start Time  1018    PT Stop Time  1100    PT Time Calculation (min)  42 min       Past Medical History:  Diagnosis Date  . Abnormal mammogram   . Allergic rhinitis 10/30/2018  . Anxiety   . Anxiety in acute stress reaction 10/30/2018  . Atypical chest pain 10/30/2018  . Colon polyps   . GERD (gastroesophageal reflux disease) 10/30/2018  . High blood pressure   . Hypertension 10/30/2018  . Hyperthyroidism   . Hypothyroidism 10/30/2018  . Migraine 10/30/2018  . Osteoporosis 10/30/2018  . 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  . Vitamin D deficiency 10/30/2018    Past Surgical History:  Procedure Laterality Date  . HYSTERECTOMY ABDOMINAL WITH SALPINGECTOMY      There were no vitals filed for this visit.  Subjective Assessment - 06/30/19 1023    Subjective  Pt reports her LE feel not as tight.  she has been doing the stretches daily.  She thinks taking Celebrex has helped reduce the pain. She reports she moved here last August and has done a lot of garden work this year.  She has some questions regarding HEP app.    Currently in Pain?  No/denies    Pain Score  0-No pain    Pain Location  Hand    Pain Orientation  Left;Right    Pain Score  0   put voltaren gel on this morning.   Pain Location  Knee    Pain Orientation  Left;Right         [facility] PT Assessment - 06/30/19 0001      Assessment   Medical Diagnosis  bil OA hands and knees    Referring Provider (PT)  Dr. Dianah Field    Onset Date/Surgical Date   01/08/19    Hand Dominance  Right    Next MD Visit  6 weeks       [facility] PT Treatment/Exercise - 06/30/19 0001      Exercises   Exercises  Knee/Hip;Hand      Knee/Hip Exercises: Stretches   Passive Hamstring Stretch  Right;Left;3 reps    Passive Hamstring Stretch Limitations  cues for straight back with seated version (1rep), switched to supine with strap with greater ease. Cues for not holding breath and keeping count of time.     Quad Stretch  Right;Left;2 reps;20 seconds      Knee/Hip Exercises: Aerobic   Nustep  L4: arms/legs x 5 min      Knee/Hip Exercises: Seated   Sit to Sand  10 reps;without UE support   eccentric lowering; cues for knee alignment     Knee/Hip Exercises: Supine   Bridges  1 set;10 reps      Hand Exercises   Other Hand Exercises  stress ball squeeze x 10 each hand, finger ext with thick rubber band x 10 reps each hand (all fingers and thumb at same time)     Other Hand Exercises  Rt/Lt wrist ext stretch  x 20 sec each side;  prayer stretch x 10 sec      Reviewed exercises within Medbridge app and parameters for current exercises.  Pt verbalized understanding.  (App doesn't show person holding stretches as long as prescribed in HEP).       PT Education - 06/30/19 1230    Education Details  HEP; issued stress ball and rubber band    Person(s) Educated  Patient    Methods  Explanation;Demonstration;Handout    Comprehension  Verbalized understanding;Returned demonstration       PT Short Term Goals - 06/24/19 1037      PT SHORT TERM GOAL #1   Title  Ind with initial HEP    Time  2    Period  Weeks    Status  New    Target Date  07/08/19      PT SHORT TERM GOAL #2   Title  decreased hand pain/stiffness by 50% with ADLS    Time  3    Period  Weeks    Status  New    Target Date  07/15/19        PT Long Term Goals - 06/24/19 1038      PT LONG TERM GOAL #1   Title  Pt to improve strength in hands to be able to open door knobs and  perform other ADLS with [id number] less difficulty.    Time  6    Period  Weeks    Status  New    Target Date  08/05/19      PT LONG TERM GOAL #2   Title  Patient to report decreased pain/stiffness in hands by 75% or more to improve function.    Time  6    Period  Weeks    Status  New    Target Date  08/05/19      PT LONG TERM GOAL #3   Title  Patient to demo improved BLE strength to 4+/5 or better to improve function.    Time  6    Status  New      PT LONG TERM GOAL #4   Title  Patient able to get up/down to floor/ground without difficulty.    Time  6    Period  Weeks    Status  New      PT LONG TERM GOAL #5   Title  Patient to report decreased pain in bil knees with ADLS by [id number] or more.    Time  6    Period  Weeks    Status  New            Plan - 06/30/19 1130    Clinical Impression Statement  Pt required some cues for posture with hamstring stretch; trial of supine HS stretch went well/ issued as alternative.  Pt tolerated all other exercises well, without increase in pain. She demonstrated some functional quad weakness with sit to/from stand exercise.   Pt may benefit from DN/ gentle manual therapy if future session to address tightness.    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   Stability/Clinical Decision Making  Stable/Uncomplicated    Rehab Potential  Excellent    PT Frequency  2x / week    PT Duration  6 weeks    PT  Treatment/Interventions  ADLs/Self Care Home Management;Cryotherapy;Electrical Stimulation;Iontophoresis 4mg /ml Dexamethasone;Moist Heat;Ultrasound;Therapeutic exercise;Neuromuscular re-education;Patient/family education;Dry needling;Manual techniques;Taping    PT Next Visit Plan  DN to hands, STW/manual to quads, lower legs, LE  strengthening    PT Home Exercise Plan  [id number]    Consulted and Agree with Plan of Care  Patient       Patient will benefit from skilled therapeutic intervention in order to improve the following deficits and impairments:  Pain, Impaired UE functional use, Decreased activity tolerance, Impaired flexibility, Decreased strength, Postural dysfunction  Visit Diagnosis: Pain in joints of right hand  Pain in joints of left hand  Chronic pain of right knee  Chronic pain of left knee  Muscle weakness (generalized)     Problem List Patient Active Problem List   Diagnosis Date Noted  . 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/23/2019  . Primary osteoarthritis of both hands 06/23/2019  . Trigger thumb of left hand 06/23/2019  . Polyarthralgia 06/23/2019  . Hypertension 10/30/2018  . Atypical chest pain 10/30/2018  . Migraine 10/30/2018  . Hypothyroidism 10/30/2018  . Osteoporosis 10/30/2018  . GERD (gastroesophageal reflux disease) 10/30/2018  . Anxiety in acute stress reaction 10/30/2018  . Allergic rhinitis 10/30/2018  . Vitamin D deficiency 10/30/2018  . 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   Kerin Perna, PTA 06/30/19 12:33 PM  [place], [place], [address] Phone: [phone]   Fax:  [phone]  Name: Cindy [name] MRN: [id number] Date of Birth: [birth year]

## 2019-07-02 ENCOUNTER — Encounter: Payer: 59 | Admitting: Physical Therapy

## 2019-07-06 ENCOUNTER — Encounter: Payer: 59 | Admitting: Physical Therapy

## 2019-07-09 ENCOUNTER — Encounter: Payer: 59 | Admitting: Physical Therapy

## 2019-07-12 ENCOUNTER — Encounter: Payer: Self-pay | Admitting: Physical Therapy

## 2019-07-12 ENCOUNTER — Ambulatory Visit (INDEPENDENT_AMBULATORY_CARE_PROVIDER_SITE_OTHER): Payer: Medicare Other | Admitting: Physical Therapy

## 2019-07-12 ENCOUNTER — Other Ambulatory Visit: Payer: Self-pay

## 2019-07-12 DIAGNOSIS — M25561 Pain in right knee: Secondary | ICD-10-CM | POA: Diagnosis not present

## 2019-07-12 DIAGNOSIS — G8929 Other chronic pain: Secondary | ICD-10-CM | POA: Diagnosis not present

## 2019-07-12 DIAGNOSIS — M25542 Pain in joints of left hand: Secondary | ICD-10-CM | POA: Diagnosis not present

## 2019-07-12 DIAGNOSIS — M25562 Pain in left knee: Secondary | ICD-10-CM

## 2019-07-12 DIAGNOSIS — M6281 Muscle weakness (generalized): Secondary | ICD-10-CM | POA: Diagnosis not present

## 2019-07-12 DIAGNOSIS — M25541 Pain in joints of right hand: Secondary | ICD-10-CM

## 2019-07-12 NOTE — Patient Instructions (Signed)
Access Code: [email or web address]  URL: [email or web address]  Date: 07/12/2019  Prepared by: Madelyn Flavors   Exercises Squat with Chair Touch - 10 reps - 1 sets - 1x daily - 7x weekly Supine Bridge - 10 reps - 1 sets - 3 seconds hold - 1x daily - 7x weekly Resisted Finger Extension and Thumb Abduction - 10 reps - 2 sets - 1x daily - 7x weekly Quadriceps Stretch with Chair - 2 reps - 1 sets - 15 seconds hold - 1-2x daily - 7x weekly Gastroc Stretch on Wall - 3 reps - 1 sets - 30 sec hold - 2x daily - 7x weekly Hooklying Hamstring Stretch with Strap - 2-3 reps - 1 sets - 30-60 seconds hold - 2x daily - 7x weekly Wrist Flexor Stretch in Pronation - 2-3 reps - 1 sets - 15-20 seconds hold - 1x daily - 7x weekly Supine Figure 4 Piriformis Stretch - 3 reps - 1 sets - 30-60 sec hold - 1x daily - 7x weekly Supine Quadratus Lumborum Stretch - 3 reps - 1 sets - 60 sec hold - 2x daily - 7x weekly

## 2019-07-12 NOTE — Therapy (Signed)
[place], [place], [address] Phone: [phone]   Fax:  [phone]  Physical Therapy Treatment  Patient Details  Name: Cindy [name] MRN: [id number] Date of Birth: 01-Jun-1946 Referring Provider (PT): Dr. Dianah Field   Encounter Date: 07/12/2019  PT End of Session - 07/12/19 0938    Visit Number  3    Number of Visits  12    Date for PT Re-Evaluation  08/05/19    PT Start Time  0936    PT Stop Time  1018    PT Time Calculation (min)  42 min    Activity Tolerance  Patient tolerated treatment well    Behavior During Therapy  [facility] for tasks assessed/performed       Past Medical History:  Diagnosis Date  . Abnormal mammogram   . Allergic rhinitis 10/30/2018  . Anxiety   . Anxiety in acute stress reaction 10/30/2018  . Atypical chest pain 10/30/2018  . Colon polyps   . GERD (gastroesophageal reflux disease) 10/30/2018  . High blood pressure   . Hypertension 10/30/2018  . Hyperthyroidism   . Hypothyroidism 10/30/2018  . Migraine 10/30/2018  . Osteoporosis 10/30/2018  . 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  . Vitamin D deficiency 10/30/2018    Past Surgical History:  Procedure Laterality Date  . HYSTERECTOMY ABDOMINAL WITH SALPINGECTOMY      There were no vitals filed for this visit.  Subjective Assessment - 07/12/19 0938    Subjective  Patient reports she is feeling better in hands and knees. Stiff in the morning but then better. Not using the Voltarin as much.    Pertinent History  HTN, OA, OP, migraines    Patient Stated Goals  to decreased pain in knees and hands    Currently in Pain?  No/denies                       [facility] Adult PT Treatment/Exercise - 07/12/19 0001      Knee/Hip Exercises: Stretches   Passive Hamstring Stretch  Right;1 rep;60 seconds    ITB Stretch  Right;1 rep;30 seconds    ITB Stretch Limitations  also did using LTR with left foot on  right knee x 1 min    Piriformis Stretch  Right;60 seconds;2 reps    Piriformis Stretch Limitations  fig 4 supine with hand pushing down on right knee      Knee/Hip Exercises: Aerobic   Nustep  L4: arms/legs x 5 min   1 short break for breathing     Knee/Hip Exercises: Seated   Sit to Sand  10 reps;without UE support   red band for cueing hip ABD/ER; eccentric lowering;      Knee/Hip Exercises: Sidelying   Hip ABduction  Both;2 sets;10 reps    Hip ABduction Limitations  2nd set tap front and back    Clams  Bil; 1 x 10; 2nd set with 5 sec hold x 10      Hand Exercises   Other Hand Exercises  red hand master squeeze x 10 bil; via yellow finger flexion bil             PT Education - 07/12/19 1323    Education Details  HEP progressed    Person(s) Educated  Patient    Methods  Explanation;Demonstration;Handout    Comprehension  Verbalized understanding;Returned demonstration       PT Short Term Goals - 07/12/19 0940  PT SHORT TERM GOAL #1   Title  Ind with initial HEP    Time  2    Period  Weeks    Status  Achieved      PT SHORT TERM GOAL #2   Title  decreased hand pain/stiffness by 50% with ADLS    Time  3    Period  Weeks    Status  Achieved        PT Long Term Goals - 07/12/19 0940      PT LONG TERM GOAL #5   Title  Patient to report decreased pain in bil knees with ADLS by [id number] or more.    Baseline  40% better    Status  On-going            Plan - 07/12/19 1324    Clinical Impression Statement  Patient progressing well with goals overall. She had marked tenderness in right hip adductors, quads and ITB and will benefit from further manual therapy or DN here. She tolerated strengthening well and additional stretches were added to HEP.    PT Treatment/Interventions  ADLs/Self Care Home Management;Cryotherapy;Electrical Stimulation;Iontophoresis 4mg /ml Dexamethasone;Moist Heat;Ultrasound;Therapeutic exercise;Neuromuscular re-education;Patient/family  education;Dry needling;Manual techniques;Taping    PT Next Visit Plan  STW/manual to quads, hip ADD lower legs, LE strengthening    PT Home Exercise Plan  [email or web address]       Patient will benefit from skilled therapeutic intervention in order to improve the following deficits and impairments:  Pain, Impaired UE functional use, Decreased activity tolerance, Impaired flexibility, Decreased strength, Postural dysfunction  Visit Diagnosis: Muscle weakness (generalized)  Chronic pain of right knee  Chronic pain of left knee  Pain in joints of right hand  Pain in joints of left hand     Problem List Patient Active Problem List   Diagnosis Date Noted  . 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/23/2019  . Primary osteoarthritis of both hands 06/23/2019  . Trigger thumb of left hand 06/23/2019  . Polyarthralgia 06/23/2019  . Hypertension 10/30/2018  . Atypical chest pain 10/30/2018  . Migraine 10/30/2018  . Hypothyroidism 10/30/2018  . Osteoporosis 10/30/2018  . GERD (gastroesophageal reflux disease) 10/30/2018  . Anxiety in acute stress reaction 10/30/2018  . Allergic rhinitis 10/30/2018  . Vitamin D deficiency 10/30/2018  . 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    Madelyn Flavors PT 07/12/2019, 4:40 PM  [facility] [place], [place], [address] Phone: [phone]   Fax:  [phone]  Name: Cindy [name] MRN: [id number] Date of Birth: 02-Nov-1945

## 2019-07-15 ENCOUNTER — Ambulatory Visit (INDEPENDENT_AMBULATORY_CARE_PROVIDER_SITE_OTHER): Payer: Medicare Other | Admitting: Physical Therapy

## 2019-07-15 ENCOUNTER — Encounter: Payer: Self-pay | Admitting: Physical Therapy

## 2019-07-15 ENCOUNTER — Other Ambulatory Visit: Payer: Self-pay

## 2019-07-15 DIAGNOSIS — G8929 Other chronic pain: Secondary | ICD-10-CM

## 2019-07-15 DIAGNOSIS — M25562 Pain in left knee: Secondary | ICD-10-CM | POA: Diagnosis not present

## 2019-07-15 DIAGNOSIS — M6281 Muscle weakness (generalized): Secondary | ICD-10-CM

## 2019-07-15 DIAGNOSIS — M25561 Pain in right knee: Secondary | ICD-10-CM

## 2019-07-15 NOTE — Therapy (Signed)
[place], [place], [address] Phone: [phone]   Fax:  [phone]  Physical Therapy Treatment  Patient Details  Name: Cindy [name] MRN: [id number] Date of Birth: 09/18/1945 Referring Provider (PT): Dr. Dianah Field   Encounter Date: 07/15/2019  PT End of Session - 07/15/19 0931    Visit Number  4    Number of Visits  12    Date for PT Re-Evaluation  08/05/19    PT Start Time  0931    PT Stop Time  1015    PT Time Calculation (min)  44 min    Activity Tolerance  Patient tolerated treatment well    Behavior During Therapy  [facility] for tasks assessed/performed       Past Medical History:  Diagnosis Date  . Abnormal mammogram   . Allergic rhinitis 10/30/2018  . Anxiety   . Anxiety in acute stress reaction 10/30/2018  . Atypical chest pain 10/30/2018  . Colon polyps   . GERD (gastroesophageal reflux disease) 10/30/2018  . High blood pressure   . Hypertension 10/30/2018  . Hyperthyroidism   . Hypothyroidism 10/30/2018  . Migraine 10/30/2018  . Osteoporosis 10/30/2018  . 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  . Vitamin D deficiency 10/30/2018    Past Surgical History:  Procedure Laterality Date  . HYSTERECTOMY ABDOMINAL WITH SALPINGECTOMY      There were no vitals filed for this visit.  Subjective Assessment - 07/15/19 0932    Subjective  Patient said she went for a walk yesterday after doing yard work and taking down decorations and her knee pain was severe. It was almost giving way on me and I had to stop and rest it. Today is just soreness.I should have taped it.    Pertinent History  HTN, OA, OP, migraines    Patient Stated Goals  to decrease knee pain    Currently in Pain?  Yes    Pain Score  8     Pain Location  Knee    Pain Orientation  Right    Pain Descriptors / Indicators  Sore    Pain Type  Acute pain                       [facility] Adult PT  Treatment/Exercise - 07/15/19 0001      Knee/Hip Exercises: Stretches   Active Hamstring Stretch  Right;Left;1 rep;60 seconds    Passive Hamstring Stretch  Right;Left;1 rep;60 seconds    Passive Hamstring Stretch Limitations  with strap    ITB Stretch  Right;1 rep;60 seconds    Other Knee/Hip Stretches  supine hip ADD stretch with strap right x 60 sec    Other Knee/Hip Stretches  supine right butterfly x 60 sec      Knee/Hip Exercises: Aerobic   Nustep  L5 just legs x 5 min      Manual Therapy   Manual Therapy  Soft tissue mobilization    Manual therapy comments  skilled palpation and monitoring of soft tissue during DN     Soft tissue mobilization  IASTM to right quads, ITB and hip adductors;        Trigger Point Dry Needling - 07/15/19 0001    Consent Given?  Yes    Education Handout Provided  Yes    Muscles Treated Lower Quadrant  Quadriceps;Adductor longus/brevis/magnus;Anterior tibialis    Dry Needling Comments  right    Quadriceps Response  Twitch response elicited;Palpable increased muscle  length    Adductor Response  Twitch response elicited;Palpable increased muscle length    Anterior tibialis Response  Twitch response elicited;Palpable increased muscle length           PT Education - 07/15/19 1226    Education Details  DN education and aftercare reviewed    Person(s) Educated  Patient    Methods  Explanation    Comprehension  Verbalized understanding       PT Short Term Goals - 07/12/19 0940      PT SHORT TERM GOAL #1   Title  Ind with initial HEP    Time  2    Period  Weeks    Status  Achieved      PT SHORT TERM GOAL #2   Title  decreased hand pain/stiffness by 50% with ADLS    Time  3    Period  Weeks    Status  Achieved        PT Long Term Goals - 07/12/19 0940      PT LONG TERM GOAL #5   Title  Patient to report decreased pain in bil knees with ADLS by [id number] or more.    Baseline  40% better    Status  On-going            Plan -  07/15/19 1237    Clinical Impression Statement  Patient c/o of increased pain yesterday with walking in right medial knee. She has multiple TPs throughout right quads, adductors and ant tibialis. She responded well to DN and STM in these areas.    Comorbidities  HTN, OA, OP, migraines    PT Treatment/Interventions  ADLs/Self Care Home Management;Cryotherapy;Electrical Stimulation;Iontophoresis 4mg /ml Dexamethasone;Moist Heat;Ultrasound;Therapeutic exercise;Neuromuscular re-education;Patient/family education;Dry needling;Manual techniques;Taping    PT Next Visit Plan  Assess DN, continue STW/manual to quads, hip ADD lower legs, LE strengthening    PT Home Exercise Plan  [email or web address]    Consulted and Agree with Plan of Care  Patient       Patient will benefit from skilled therapeutic intervention in order to improve the following deficits and impairments:  Pain, Impaired UE functional use, Decreased activity tolerance, Impaired flexibility, Decreased strength, Postural dysfunction  Visit Diagnosis: Chronic pain of right knee  Chronic pain of left knee  Muscle weakness (generalized)     Problem List Patient Active Problem List   Diagnosis Date Noted  . 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/23/2019  . Primary osteoarthritis of both hands 06/23/2019  . Trigger thumb of left hand 06/23/2019  . Polyarthralgia 06/23/2019  . Hypertension 10/30/2018  . Atypical chest pain 10/30/2018  . Migraine 10/30/2018  . Hypothyroidism 10/30/2018  . Osteoporosis 10/30/2018  . GERD (gastroesophageal reflux disease) 10/30/2018  . Anxiety in acute stress reaction 10/30/2018  . Allergic rhinitis 10/30/2018  . Vitamin D deficiency 10/30/2018  . 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   Madelyn Flavors PT 07/15/2019, 12:41 PM  [facility] [place], [place], [address] Phone: [phone]   Fax:  [phone]  Name: Angeleena Mertins MRN: [id number] Date of Birth: 05-17-1946

## 2019-07-15 NOTE — Patient Instructions (Signed)
Access Code: [email or web address]  URL: [email or web address]  Date: 07/15/2019  Prepared by: Madelyn Flavors   Exercises Squat with Chair Touch - 10 reps - 1 sets - 1x daily - 7x weekly Supine Bridge - 10 reps - 1 sets - 3 seconds hold - 1x daily - 7x weekly Resisted Finger Extension and Thumb Abduction - 10 reps - 2 sets - 1x daily - 7x weekly Quadriceps Stretch with Chair - 2 reps - 1 sets - 15 seconds hold - 1-2x daily - 7x weekly Gastroc Stretch on Wall - 3 reps - 1 sets - 30 sec hold - 2x daily - 7x weekly Hooklying Hamstring Stretch with Strap - 2-3 reps - 1 sets - 30-60 seconds hold - 2x daily - 7x weekly Wrist Flexor Stretch in Pronation - 2-3 reps - 1 sets - 15-20 seconds hold - 1x daily - 7x weekly Supine Figure 4 Piriformis Stretch - 3 reps - 1 sets - 30-60 sec hold - 1x daily - 7x weekly Supine Quadratus Lumborum Stretch - 3 reps - 1 sets - 60 sec hold - 2x daily - 7x weekly Patient Education Trigger Point Dry Needling

## 2019-07-19 ENCOUNTER — Ambulatory Visit (INDEPENDENT_AMBULATORY_CARE_PROVIDER_SITE_OTHER): Payer: Medicare Other | Admitting: Physical Therapy

## 2019-07-19 ENCOUNTER — Other Ambulatory Visit: Payer: Self-pay

## 2019-07-19 DIAGNOSIS — G8929 Other chronic pain: Secondary | ICD-10-CM

## 2019-07-19 DIAGNOSIS — M25562 Pain in left knee: Secondary | ICD-10-CM | POA: Diagnosis not present

## 2019-07-19 DIAGNOSIS — M25561 Pain in right knee: Secondary | ICD-10-CM | POA: Diagnosis not present

## 2019-07-19 DIAGNOSIS — M25541 Pain in joints of right hand: Secondary | ICD-10-CM

## 2019-07-19 DIAGNOSIS — M25542 Pain in joints of left hand: Secondary | ICD-10-CM | POA: Diagnosis not present

## 2019-07-19 DIAGNOSIS — M6281 Muscle weakness (generalized): Secondary | ICD-10-CM | POA: Diagnosis not present

## 2019-07-19 NOTE — Therapy (Signed)
[place], [place], [address] Phone: [phone]   Fax:  [phone]  Physical Therapy Treatment  Patient Details  Name: Cindy [name] MRN: [id number] Date of Birth: March 22, 1946 Referring Provider (PT): Dr. Dianah Field   Encounter Date: 07/19/2019  PT End of Session - 07/19/19 0939    Visit Number  5    Number of Visits  12    Date for PT Re-Evaluation  08/05/19    PT Start Time  0937    PT Stop Time  1019    PT Time Calculation (min)  42 min    Activity Tolerance  Patient tolerated treatment well;No increased pain    Behavior During Therapy  [facility] for tasks assessed/performed       Past Medical History:  Diagnosis Date  . Abnormal mammogram   . Allergic rhinitis 10/30/2018  . Anxiety   . Anxiety in acute stress reaction 10/30/2018  . Atypical chest pain 10/30/2018  . Colon polyps   . GERD (gastroesophageal reflux disease) 10/30/2018  . High blood pressure   . Hypertension 10/30/2018  . Hyperthyroidism   . Hypothyroidism 10/30/2018  . Migraine 10/30/2018  . Osteoporosis 10/30/2018  . 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  . Vitamin D deficiency 10/30/2018    Past Surgical History:  Procedure Laterality Date  . HYSTERECTOMY ABDOMINAL WITH SALPINGECTOMY      There were no vitals filed for this visit.  Subjective Assessment - 07/19/19 0940    Subjective  Pt reports good amount of relief in RLE after DN last session.  Her fingers aren't as stiff as when she first started coming, "over 50% better".    Patient Stated Goals  to decrease knee pain    Currently in Pain?  No/denies    Pain Score  0-No pain         [facility] PT Assessment - 07/19/19 0001      Assessment   Medical Diagnosis  bil OA hands and knees    Referring Provider (PT)  Dr. Dianah Field    Onset Date/Surgical Date  01/08/19    Hand Dominance  Right    Next MD Visit  6 weeks      Strength   Right Hip Flexion   4+/5    Right Hip Extension  4+/5    Right Hip ABduction  5/5    Left Hip Flexion  --   5-/5   Left Hip Extension  5/5    Left Hip ABduction  4+/5    Right/Left Knee  Right;Left    Right Knee Flexion  4+/5    Right Knee Extension  5/5    Left Knee Flexion  4+/5    Left Knee Extension  5/5       [facility] Adult PT Treatment/Exercise - 07/19/19 0001      Knee/Hip Exercises: Stretches   Sports administrator  Right;Left;2 reps;20 seconds   standing, with cues for form   Quad Stretch Limitations  some discomfort in Rt lateral knee with Lt quad stretch; educated pt verbally on supine version of stretch for less lateral Rt knee stress      Knee/Hip Exercises: Aerobic   Nustep  L4: 5 min (legs only)      Knee/Hip Exercises: Standing   SLS  Rt/Lt SLS with intermittent UE support x 20 sec x 2 reps each     Other Standing Knee Exercises  tandem stance x 20 sec each foot forward x 2 reps  Other Standing Knee Exercises  standing to/from sitting on floor via lunge/ single leg high kneeling (on yoga mat) with cues to sequence and form (without UE on table) x 5 reps       Knee/Hip Exercises: Seated   Sit to Sand  10 reps;without UE support   red band for cueing hip ABD/ER; eccentric lowering;      Knee/Hip Exercises: Supine   Bridges  Strengthening;1 set;10 reps   red band around thighs, with adduction   Straight Leg Raises  Strengthening;Right;Left;1 set;10 reps      Manual Therapy   Soft tissue mobilization  STM to Rt calf to decrease fascial restrictions (did not tolerate IASTM to calf)                PT Short Term Goals - 07/12/19 0940      PT SHORT TERM GOAL #1   Title  Ind with initial HEP    Time  2    Period  Weeks    Status  Achieved      PT SHORT TERM GOAL #2   Title  decreased hand pain/stiffness by 50% with ADLS    Time  3    Period  Weeks    Status  Achieved        PT Long Term Goals - 07/19/19 0949      PT LONG TERM GOAL #1   Title  Pt to improve strength  in hands to be able to open door knobs and perform other ADLS with 47% less difficulty.    Time  6    Period  Weeks    Status  Achieved      PT LONG TERM GOAL #2   Title  Patient to report decreased pain/stiffness in hands by 75% or more to improve function.    Time  6    Period  Weeks    Status  Partially Met      PT LONG TERM GOAL #3   Title  Patient to demo improved BLE strength to 4+/5 or better to improve function.    Time  6    Status  Achieved      PT LONG TERM GOAL #4   Title  Patient able to get up/down to floor/ground without difficulty.    Time  6    Period  Weeks    Status  On-going      PT LONG TERM GOAL #5   Title  Patient to report decreased pain in bil knees with ADLS by 82% or more.    Time  6    Period  Weeks    Status  On-going            Plan - 07/19/19 1030    Clinical Impression Statement  Positive response to DN to RLE last session with lasting relief.  Pt demonstrated improved LE strength; has met LTG #3.  Pt able to complete transfer to/from floor with minimal difficulty.  Overall making good gains towards remaining goals.    Comorbidities  HTN, OA, OP, migraines    Rehab Potential  Excellent    PT Frequency  2x / week    PT Duration  6 weeks    PT Treatment/Interventions  ADLs/Self Care Home Management;Cryotherapy;Electrical Stimulation;Iontophoresis 60m/ml Dexamethasone;Moist Heat;Ultrasound;Therapeutic exercise;Neuromuscular re-education;Patient/family education;Dry needling;Manual techniques;Taping    PT Next Visit Plan  continue STW/manual to quads, hip ADD, lower legs, LE strengthening    PT Home Exercise Plan  [email or web address]  Consulted and Agree with Plan of Care  Patient       Patient will benefit from skilled therapeutic intervention in order to improve the following deficits and impairments:  Pain, Impaired UE functional use, Decreased activity tolerance, Impaired flexibility, Decreased strength, Postural dysfunction  Visit  Diagnosis: Chronic pain of right knee  Chronic pain of left knee  Muscle weakness (generalized)     Problem List Patient Active Problem List   Diagnosis Date Noted  . 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/23/2019  . Primary osteoarthritis of both hands 06/23/2019  . Trigger thumb of left hand 06/23/2019  . Polyarthralgia 06/23/2019  . Hypertension 10/30/2018  . Atypical chest pain 10/30/2018  . Migraine 10/30/2018  . Hypothyroidism 10/30/2018  . Osteoporosis 10/30/2018  . GERD (gastroesophageal reflux disease) 10/30/2018  . Anxiety in acute stress reaction 10/30/2018  . Allergic rhinitis 10/30/2018  . Vitamin D deficiency 10/30/2018  . 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   Kerin Perna, PTA 07/19/19 10:33 AM  [place], [place], [address] Phone: [phone]   Fax:  [phone]  Name: Cindy [name] MRN: [id number] Date of Birth: Jul 16, 1946

## 2019-07-22 ENCOUNTER — Other Ambulatory Visit: Payer: Self-pay

## 2019-07-22 ENCOUNTER — Ambulatory Visit (INDEPENDENT_AMBULATORY_CARE_PROVIDER_SITE_OTHER): Payer: Medicare Other | Admitting: Physical Therapy

## 2019-07-22 ENCOUNTER — Encounter: Payer: Self-pay | Admitting: Physical Therapy

## 2019-07-22 DIAGNOSIS — M6281 Muscle weakness (generalized): Secondary | ICD-10-CM | POA: Diagnosis not present

## 2019-07-22 DIAGNOSIS — M25561 Pain in right knee: Secondary | ICD-10-CM | POA: Diagnosis not present

## 2019-07-22 DIAGNOSIS — G8929 Other chronic pain: Secondary | ICD-10-CM

## 2019-07-22 NOTE — Therapy (Signed)
[place], [place], [address] Phone: [phone]   Fax:  [phone]  Physical Therapy Treatment  Patient Details  Name: Cindy [name] MRN: [id number] Date of Birth: 1946/03/12 Referring Provider (PT): Dr. Dianah Field   Encounter Date: 07/22/2019  PT End of Session - 07/22/19 1018    Visit Number  6    Number of Visits  12    Date for PT Re-Evaluation  08/05/19    PT Start Time  6962    PT Stop Time  1100    PT Time Calculation (min)  45 min    Activity Tolerance  Patient tolerated treatment well    Behavior During Therapy  [facility] for tasks assessed/performed       Past Medical History:  Diagnosis Date  . Abnormal mammogram   . Allergic rhinitis 10/30/2018  . Anxiety   . Anxiety in acute stress reaction 10/30/2018  . Atypical chest pain 10/30/2018  . Colon polyps   . GERD (gastroesophageal reflux disease) 10/30/2018  . High blood pressure   . Hypertension 10/30/2018  . Hyperthyroidism   . Hypothyroidism 10/30/2018  . Migraine 10/30/2018  . Osteoporosis 10/30/2018  . 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  . Vitamin D deficiency 10/30/2018    Past Surgical History:  Procedure Laterality Date  . HYSTERECTOMY ABDOMINAL WITH SALPINGECTOMY      There were no vitals filed for this visit.  Subjective Assessment - 07/22/19 1019    Subjective  Patient having some throbbing in medial knee.    Pertinent History  HTN, OA, OP, migraines    Limitations  Walking    Patient Stated Goals  to decrease knee pain    Pain Score  3     Pain Location  Knee    Pain Orientation  Right;Medial    Pain Descriptors / Indicators  Sore    Pain Type  Acute pain                       [facility] Adult PT Treatment/Exercise - 07/22/19 0001      Knee/Hip Exercises: Aerobic   Nustep  L5 x 5 legs only      Knee/Hip Exercises: Standing   Hip Abduction  Both;2 sets;10 reps    SLS  Rt/Lt SLS with  intermittent UE support multiple reps each  max hold    Other Standing Knee Exercises  tandem stance each foot forward x multiple reps max hold, with head turns also (difficult)      Knee/Hip Exercises: Seated   Sit to Sand  20 reps;without UE support   red band for cueing hip ABD/ER; eccentric lowering;      Knee/Hip Exercises: Supine   Bridges  Strengthening;1 set;10 reps   red band around thighs, with adduction   Bridges with Clamshell  Both;2 sets;5 reps   with red band   Straight Leg Raises  10 reps;Both    Straight Leg Raise with External Rotation  10 reps;Both      Knee/Hip Exercises: Sidelying   Hip ADduction  20 reps;Both      Modalities   Modalities  Iontophoresis      Iontophoresis   Type of Iontophoresis  Dexamethasone    Location  medial right knee    Dose  1.0 cc    Time  4 hour patch             PT Education - 07/22/19 1057  Education Details  ionto education    Person(s) Educated  Patient    Methods  Explanation;Handout    Comprehension  Verbalized understanding       PT Short Term Goals - 07/12/19 0940      PT SHORT TERM GOAL #1   Title  Ind with initial HEP    Time  2    Period  Weeks    Status  Achieved      PT SHORT TERM GOAL #2   Title  decreased hand pain/stiffness by 50% with ADLS    Time  3    Period  Weeks    Status  Achieved        PT Long Term Goals - 07/19/19 0949      PT LONG TERM GOAL #1   Title  Pt to improve strength in hands to be able to open door knobs and perform other ADLS with 62% less difficulty.    Time  6    Period  Weeks    Status  Achieved      PT LONG TERM GOAL #2   Title  Patient to report decreased pain/stiffness in hands by 75% or more to improve function.    Time  6    Period  Weeks    Status  Partially Met      PT LONG TERM GOAL #3   Title  Patient to demo improved BLE strength to 4+/5 or better to improve function.    Time  6    Status  Achieved      PT LONG TERM GOAL #4   Title   Patient able to get up/down to floor/ground without difficulty.    Time  6    Period  Weeks    Status  On-going      PT LONG TERM GOAL #5   Title  Patient to report decreased pain in bil knees with ADLS by 03% or more.    Time  6    Period  Weeks    Status  On-going            Plan - 07/22/19 1333    Clinical Impression Statement  Patient did well with TE today. Still c/o pain at right medial joint line and pes anserine. Ionto trial today. Patient demonstrates right gluteus med weakness with balance activites.    PT Treatment/Interventions  ADLs/Self Care Home Management;Cryotherapy;Electrical Stimulation;Iontophoresis 65m/ml Dexamethasone;Moist Heat;Ultrasound;Therapeutic exercise;Neuromuscular re-education;Patient/family education;Dry needling;Manual techniques;Taping    PT Next Visit Plan  Focus on right gluteus med strength, assess ionto; continue STW/manual to quads, hip ADD, lower legs, LE strengthening, balance    PT Home Exercise Plan  [id number]       Patient will benefit from skilled therapeutic intervention in order to improve the following deficits and impairments:  Pain, Impaired UE functional use, Decreased activity tolerance, Impaired flexibility, Decreased strength, Postural dysfunction  Visit Diagnosis: Chronic pain of right knee  Muscle weakness (generalized)     Problem List Patient Active Problem List   Diagnosis Date Noted  . 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/23/2019  . Primary osteoarthritis of both hands 06/23/2019  . Trigger thumb of left hand 06/23/2019  . Polyarthralgia 06/23/2019  . Hypertension 10/30/2018  . Atypical chest pain 10/30/2018  . Migraine 10/30/2018  . Hypothyroidism 10/30/2018  . Osteoporosis 10/30/2018  . GERD (gastroesophageal reflux disease) 10/30/2018  . Anxiety in acute stress reaction 10/30/2018  . Allergic rhinitis 10/30/2018  . Vitamin D deficiency 10/30/2018  .  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    Madelyn Flavors PT 07/22/2019, 1:39 PM  [facility] [place], [place], [address] Phone: [phone]   Fax:  [phone]  Name: Cindy [name] MRN: [id number] Date of Birth: 09-19-1945

## 2019-07-22 NOTE — Patient Instructions (Signed)
IONTOPHORESIS PATIENT PRECAUTIONS & CONTRAINDICATIONS:  . Redness under one or both electrodes can occur.  This characterized by a uniform redness that usually disappears within 12 hours of treatment. . Small pinhead size blisters may result in response to the drug.  Contact your physician if the problem persists more than 24 hours. . On rare occasions, iontophoresis therapy can result in temporary skin reactions such as rash, inflammation, irritation or burns.  The skin reactions may be the result of individual sensitivity to the ionic solution used, the condition of the skin at the start of treatment, reaction to the materials in the electrodes, allergies or sensitivity to dexamethasone, or a poor connection between the patch and your skin.  Discontinue using iontophoresis if you have any of these reactions and report to your therapist. . Remove the Patch or electrodes if you have any undue sensation of pain or burning during the treatment and report discomfort to your therapist. . Tell your Therapist if you have had known adverse reactions to the application of electrical current. Marland Kitchen Keep out of the reach of children.   . DO NOT use if you have a cardiac pacemaker or any other electrically sensitive implanted device. . DO NOT use if you have a known sensitivity to dexamethasone. . DO NOT use during Magnetic Resonance Imaging (MRI). . DO NOT use over broken or compromised skin (e.g. sunburn, cuts, or acne) due to the increased risk of skin reaction. . DO NOT SHAVE over the area to be treated:  To establish good contact between the Patch and the skin, excessive hair may be clipped.   Madelyn Flavors, PT 07/22/19 10:57 AM  [facility] [place], [place] [address]  [phone] (office) [phone] (fax)

## 2019-07-26 ENCOUNTER — Encounter: Payer: 59 | Admitting: Physical Therapy

## 2019-07-27 ENCOUNTER — Encounter: Payer: Self-pay | Admitting: Physical Therapy

## 2019-07-27 ENCOUNTER — Other Ambulatory Visit: Payer: Self-pay

## 2019-07-27 ENCOUNTER — Ambulatory Visit (INDEPENDENT_AMBULATORY_CARE_PROVIDER_SITE_OTHER): Payer: Medicare Other | Admitting: Physical Therapy

## 2019-07-27 DIAGNOSIS — G8929 Other chronic pain: Secondary | ICD-10-CM

## 2019-07-27 DIAGNOSIS — M6281 Muscle weakness (generalized): Secondary | ICD-10-CM | POA: Diagnosis not present

## 2019-07-27 DIAGNOSIS — M25562 Pain in left knee: Secondary | ICD-10-CM | POA: Diagnosis not present

## 2019-07-27 DIAGNOSIS — M25561 Pain in right knee: Secondary | ICD-10-CM

## 2019-07-27 NOTE — Therapy (Signed)
[place], [place], [address] Phone: [phone]   Fax:  [phone]  Physical Therapy Treatment  Patient Details  Name: Cindy [name] MRN: [id number] Date of Birth: 04-Jun-1946 Referring Provider (PT): Dr. Dianah Field   Encounter Date: 07/27/2019  PT End of Session - 07/27/19 1108    Visit Number  7    Number of Visits  12    Date for PT Re-Evaluation  08/05/19    PT Start Time  1102    PT Stop Time  1149    PT Time Calculation (min)  47 min    Activity Tolerance  Patient tolerated treatment well    Behavior During Therapy  [facility] for tasks assessed/performed       Past Medical History:  Diagnosis Date  . Abnormal mammogram   . Allergic rhinitis 10/30/2018  . Anxiety   . Anxiety in acute stress reaction 10/30/2018  . Atypical chest pain 10/30/2018  . Colon polyps   . GERD (gastroesophageal reflux disease) 10/30/2018  . High blood pressure   . Hypertension 10/30/2018  . Hyperthyroidism   . Hypothyroidism 10/30/2018  . Migraine 10/30/2018  . Osteoporosis 10/30/2018  . 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  . Vitamin D deficiency 10/30/2018    Past Surgical History:  Procedure Laterality Date  . HYSTERECTOMY ABDOMINAL WITH SALPINGECTOMY      There were no vitals filed for this visit.  Subjective Assessment - 07/27/19 1108    Subjective  Patient states she has no pain today, but has been having stiffness over the past few days maybe due to cold. She thought the patch helped some.    Pertinent History  HTN, OA, OP, migraines    Patient Stated Goals  to decrease knee pain    Currently in Pain?  No/denies                       [facility] Adult PT Treatment/Exercise - 07/27/19 0001      Knee/Hip Exercises: Stretches   ITB Stretch  Both;2 reps;60 seconds    ITB Stretch Limitations  with strap    Other Knee/Hip Stretches  butterfly bil 2x60 sec      Knee/Hip Exercises:  Aerobic   Nustep  L5 x 6 min legs only      Knee/Hip Exercises: Standing   Abduction Limitations  side stepping with red band x 20 steps each direction    Other Standing Knee Exercises  Bil hip PNF D2 flex/ext x 10;     Other Standing Knee Exercises  hip hike x 10 on stairs, then on floor x 5, then in left SDLY with PT assist      Knee/Hip Exercises: Seated   Sit to Sand  10 reps   using yellow wt ball press out and red band      Knee/Hip Exercises: Sidelying   Hip ADduction  20 reps;Both      Manual Therapy   Manual Therapy  Soft tissue mobilization    Manual therapy comments  skilled palpation and monitoring of soft tissue during DN      Soft tissue mobilization  to left lateral quads and left hip ADDudctors       Trigger Point Dry Needling - 07/27/19 0001    Consent Given?  Yes    Education Handout Provided  Previously provided    Muscles Treated Lower Quadrant  Vastus lateralis   left   Quadriceps Response  Twitch  response elicited;Palpable increased muscle length           PT Education - 07/27/19 1302    Education Details  hep progressed    Person(s) Educated  Patient    Methods  Explanation;Demonstration;Handout    Comprehension  Verbalized understanding;Returned demonstration       PT Short Term Goals - 07/12/19 0940      PT SHORT TERM GOAL #1   Title  Ind with initial HEP    Time  2    Period  Weeks    Status  Achieved      PT SHORT TERM GOAL #2   Title  decreased hand pain/stiffness by 50% with ADLS    Time  3    Period  Weeks    Status  Achieved        PT Long Term Goals - 07/19/19 2800      PT LONG TERM GOAL #1   Title  Pt to improve strength in hands to be able to open door knobs and perform other ADLS with 34% less difficulty.    Time  6    Period  Weeks    Status  Achieved      PT LONG TERM GOAL #2   Title  Patient to report decreased pain/stiffness in hands by 75% or more to improve function.    Time  6    Period  Weeks    Status   Partially Met      PT LONG TERM GOAL #3   Title  Patient to demo improved BLE strength to 4+/5 or better to improve function.    Time  6    Status  Achieved      PT LONG TERM GOAL #4   Title  Patient able to get up/down to floor/ground without difficulty.    Time  6    Period  Weeks    Status  On-going      PT LONG TERM GOAL #5   Title  Patient to report decreased pain in bil knees with ADLS by 91% or more.    Time  6    Period  Weeks    Status  On-going            Plan - 07/27/19 1255    Clinical Impression Statement  Patient with decreased pain today in right knee. We worked on glut med weakness and also on hip hiking which was very difficult for pt on right side. She did well with PNF. She complained of stiffness and some pain in left upper thigh today. Some TPs present in left lateral quads, but else WNL. She responded well to DN in quads. She had KT tape on right knee today so ionto held.    PT Treatment/Interventions  ADLs/Self Care Home Management;Cryotherapy;Electrical Stimulation;Iontophoresis 23m/ml Dexamethasone;Moist Heat;Ultrasound;Therapeutic exercise;Neuromuscular re-education;Patient/family education;Dry needling;Manual techniques;Taping    PT Next Visit Plan  Focus on right gluteus med strength, assess ionto; continue STW/manual to quads, hip ADD, lower legs, LE strengthening, balance    PT Home Exercise Plan  [id number]       Patient will benefit from skilled therapeutic intervention in order to improve the following deficits and impairments:  Pain, Impaired UE functional use, Decreased activity tolerance, Impaired flexibility, Decreased strength, Postural dysfunction  Visit Diagnosis: Chronic pain of right knee  Muscle weakness (generalized)  Chronic pain of left knee     Problem List Patient Active Problem List   Diagnosis Date Noted  .  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/23/2019  . Primary osteoarthritis of both hands 06/23/2019  . Trigger  thumb of left hand 06/23/2019  . Polyarthralgia 06/23/2019  . Hypertension 10/30/2018  . Atypical chest pain 10/30/2018  . Migraine 10/30/2018  . Hypothyroidism 10/30/2018  . Osteoporosis 10/30/2018  . GERD (gastroesophageal reflux disease) 10/30/2018  . Anxiety in acute stress reaction 10/30/2018  . Allergic rhinitis 10/30/2018  . Vitamin D deficiency 10/30/2018  . 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    Madelyn Flavors PT 07/27/2019, 1:03 PM  [facility] [place], [place], [address] Phone: [phone]   Fax:  [phone]  Name: Cindy [name] MRN: [id number] Date of Birth: 11/21/1945

## 2019-07-27 NOTE — Patient Instructions (Signed)
  Access Code: [email or web address]  URL: [email or web address]  Date: 07/27/2019  Prepared by: Madelyn Flavors   Exercises Squat with Chair Touch - 10 reps - 1 sets - 1x daily - 7x weekly Supine Bridge - 10 reps - 1 sets - 3 seconds hold - 1x daily - 7x weekly Resisted Finger Extension and Thumb Abduction - 10 reps - 2 sets - 1x daily - 7x weekly Quadriceps Stretch with Chair - 2 reps - 1 sets - 15 seconds hold - 1-2x daily - 7x weekly Gastroc Stretch on Wall - 3 reps - 1 sets - 30 sec hold - 2x daily - 7x weekly Hooklying Hamstring Stretch with Strap - 2-3 reps - 1 sets - 30-60 seconds hold - 2x daily - 7x weekly Wrist Flexor Stretch in Pronation - 2-3 reps - 1 sets - 15-20 seconds hold - 1x daily - 7x weekly Supine Figure 4 Piriformis Stretch - 3 reps - 1 sets - 30-60 sec hold - 1x daily - 7x weekly Supine Quadratus Lumborum Stretch - 3 reps - 1 sets - 60 sec hold - 2x daily - 7x weekly Sidelying Hip Abduction - 10 reps - 3 sets - 1x daily - 7x weekly Supine Butterfly Groin Stretch - 3 reps - 1 sets - 60 sec hold - 2x daily - 7x weekly Supine ITB Stretch with Strap - 3 reps - 1 sets - 60 sec hold - 2x daily - 7x weekly Patient Education Trigger Point Dry Needling

## 2019-07-28 ENCOUNTER — Ambulatory Visit (INDEPENDENT_AMBULATORY_CARE_PROVIDER_SITE_OTHER): Payer: Medicare Other | Admitting: Physical Therapy

## 2019-07-28 DIAGNOSIS — G8929 Other chronic pain: Secondary | ICD-10-CM | POA: Diagnosis not present

## 2019-07-28 DIAGNOSIS — M6281 Muscle weakness (generalized): Secondary | ICD-10-CM

## 2019-07-28 DIAGNOSIS — M25562 Pain in left knee: Secondary | ICD-10-CM

## 2019-07-28 DIAGNOSIS — M25561 Pain in right knee: Secondary | ICD-10-CM

## 2019-07-28 NOTE — Therapy (Addendum)
[place], [place], [address] Phone: [phone]   Fax:  [phone]  Physical Therapy Treatment and Discharge Summary  Patient Details  Name: Cindy [name] MRN: [id number] Date of Birth: August 16, 1946 Referring Provider (PT): Dr. Dianah Field   Encounter Date: 07/28/2019  PT End of Session - 07/28/19 1024    Visit Number  8    Number of Visits  12    Date for PT Re-Evaluation  08/05/19    PT Start Time  3818    PT Stop Time  1048    PT Time Calculation (min)  33 min    Activity Tolerance  Patient tolerated treatment well;No increased pain    Behavior During Therapy  [facility] for tasks assessed/performed       Past Medical History:  Diagnosis Date  . Abnormal mammogram   . Allergic rhinitis 10/30/2018  . Anxiety   . Anxiety in acute stress reaction 10/30/2018  . Atypical chest pain 10/30/2018  . Colon polyps   . GERD (gastroesophageal reflux disease) 10/30/2018  . High blood pressure   . Hypertension 10/30/2018  . Hyperthyroidism   . Hypothyroidism 10/30/2018  . Migraine 10/30/2018  . Osteoporosis 10/30/2018  . 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  . Vitamin D deficiency 10/30/2018    Past Surgical History:  Procedure Laterality Date  . HYSTERECTOMY ABDOMINAL WITH SALPINGECTOMY      There were no vitals filed for this visit.  Subjective Assessment - 07/28/19 1122    Subjective  Pt reports she had leg cramps in BLE on and off last night.  She is pleased with her progress.    Patient Stated Goals  to decrease knee pain    Currently in Pain?  No/denies    Pain Score  0-No pain         [facility] PT Assessment - 07/28/19 0001      Assessment   Medical Diagnosis  bil OA hands and knees    Referring Provider (PT)  Dr. Dianah Field    Onset Date/Surgical Date  01/08/19    Hand Dominance  Right    Next MD Visit  08/04/19      Strength   Right Hip Flexion  --   5-/5   Right Hip  Extension  --   5-/5   Right Hip ABduction  5/5    Left Hip Flexion  5/5    Left Hip Extension  --   5-/5   Left Hip ABduction  5/5        [facility] Adult PT Treatment/Exercise - 07/28/19 0001      Self-Care   Self-Care  Other Self-Care Comments    Other Self-Care Comments   pt instructed in self massage with ball to hips; pt returned demo with cues.       Knee/Hip Exercises: Stretches   ITB Stretch  Both;2 reps;30 seconds    ITB Stretch Limitations  supine with strap    Gastroc Stretch  Right;Left;2 reps;30 seconds   heels off of step   Other Knee/Hip Stretches  butterfly bil 2x60 sec    Other Knee/Hip Stretches  supine QL stretch Rt/Lt x 20 sec each side x 2 reps      Knee/Hip Exercises: Aerobic   Nustep  L5 x 5.5 min legs only      Knee/Hip Exercises: Standing   Other Standing Knee Exercises  standing to/from sitting on floor x 2 reps     Other Standing Knee Exercises  hip hike x 10 reps each side, standing on 3" step       Knee/Hip Exercises: Seated   Sit to Sand  10 reps   using yellow wt ball press out and red band               PT Short Term Goals - 07/12/19 0940      PT SHORT TERM GOAL #1   Title  Ind with initial HEP    Time  2    Period  Weeks    Status  Achieved      PT SHORT TERM GOAL #2   Title  decreased hand pain/stiffness by 50% with ADLS    Time  3    Period  Weeks    Status  Achieved        PT Long Term Goals - 07/28/19 1057      PT LONG TERM GOAL #1   Title  Pt to improve strength in hands to be able to open door knobs and perform other ADLS with 56% less difficulty.    Time  6    Period  Weeks    Status  Achieved      PT LONG TERM GOAL #2   Title  Patient to report decreased pain/stiffness in hands by 75% or more to improve function.    Time  6    Period  Weeks    Status  Achieved      PT LONG TERM GOAL #3   Title  Patient to demo improved BLE strength to 4+/5 or better to improve function.    Time  6    Status  Achieved       PT LONG TERM GOAL #4   Title  Patient able to get up/down to floor/ground without difficulty.    Time  6    Period  Weeks    Status  Achieved      PT LONG TERM GOAL #5   Title  Patient to report decreased pain in bil knees with ADLS by 38% or more.    Time  6    Period  Weeks    Status  Achieved            Plan - 07/28/19 1126    Clinical Impression Statement  Pt demonstrated improved LE strength.  Pt tolerated all exercises well, without pain or difficulty.  Pt pleased with progress thus far.  She has met her goals and is agreeable to hold therapy until upcoming MD appt.     PT Treatment/Interventions  ADLs/Self Care Home Management;Cryotherapy;Electrical Stimulation;Iontophoresis 60m/ml Dexamethasone;Moist Heat;Ultrasound;Therapeutic exercise;Neuromuscular re-education;Patient/family education;Dry needling;Manual techniques;Taping    PT Next Visit Plan  spoke to supervising PT; will hold therapy until dr appt next week.    PT Home Exercise Plan  [id number]       Patient will benefit from skilled therapeutic intervention in order to improve the following deficits and impairments:  Pain, Impaired UE functional use, Decreased activity tolerance, Impaired flexibility, Decreased strength, Postural dysfunction  Visit Diagnosis: Chronic pain of right knee  Muscle weakness (generalized)  Chronic pain of left knee     Problem List Patient Active Problem List   Diagnosis Date Noted  . 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/23/2019  . Primary osteoarthritis of both hands 06/23/2019  . Trigger thumb of left hand 06/23/2019  . Polyarthralgia 06/23/2019  . Hypertension 10/30/2018  . Atypical chest pain 10/30/2018  . Migraine 10/30/2018  . Hypothyroidism 10/30/2018  .  Osteoporosis 10/30/2018  . GERD (gastroesophageal reflux disease) 10/30/2018  . Anxiety in acute stress reaction 10/30/2018  . Allergic rhinitis 10/30/2018  . Vitamin D deficiency 10/30/2018  .  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   Kerin Perna, PTA 07/28/19 1:49 PM  [facility] [place], [place], [address] Phone: [phone]   Fax:  [phone]  Name: Cindy [name] MRN: [id number] Date of Birth: [birth year]  PHYSICAL THERAPY DISCHARGE SUMMARY  Visits from Start of Care: 8  Current functional level related to goals / functional outcomes: See above   Remaining deficits: See above   Education / Equipment: HEP  Plan: Patient agrees to discharge.  Patient goals were met. Patient is being discharged due to meeting the stated rehab goals.  ?????    Madelyn Flavors, PT 09/20/19 8:23 AM  [facility] [place], [place] [address]  [phone] (office) [phone] (fax)

## 2019-08-02 ENCOUNTER — Encounter: Payer: Medicare Other | Admitting: Physical Therapy

## 2019-08-02 ENCOUNTER — Encounter: Payer: Self-pay | Admitting: Sports Medicine

## 2019-08-04 ENCOUNTER — Ambulatory Visit: Payer: Medicare Other | Admitting: Sports Medicine

## 2019-08-04 ENCOUNTER — Ambulatory Visit (INDEPENDENT_AMBULATORY_CARE_PROVIDER_SITE_OTHER): Payer: Medicare Other | Admitting: Sports Medicine

## 2019-08-04 ENCOUNTER — Other Ambulatory Visit: Payer: Self-pay

## 2019-08-04 ENCOUNTER — Encounter: Payer: Self-pay | Admitting: Sports Medicine

## 2019-08-04 DIAGNOSIS — M65312 Trigger thumb, left thumb: Secondary | ICD-10-CM | POA: Diagnosis not present

## 2019-08-04 DIAGNOSIS — M170 Bilateral primary osteoarthritis of knee: Secondary | ICD-10-CM | POA: Diagnosis not present

## 2019-08-04 DIAGNOSIS — M19041 Primary osteoarthritis, right hand: Secondary | ICD-10-CM | POA: Diagnosis not present

## 2019-08-04 DIAGNOSIS — M2550 Pain in unspecified joint: Secondary | ICD-10-CM | POA: Diagnosis not present

## 2019-08-04 DIAGNOSIS — M19042 Primary osteoarthritis, left hand: Secondary | ICD-10-CM | POA: Diagnosis not present

## 2019-08-04 NOTE — Assessment & Plan Note (Signed)
Negative rheumatoid work-up, polyarthralgias have resolved with Celebrex.

## 2019-08-04 NOTE — Assessment & Plan Note (Signed)
Resolved with Celebrex and PT, we can certainly do her [employer] tendon sheath injection if recurrence.

## 2019-08-04 NOTE — Progress Notes (Signed)
Subjective:    CC: Follow-up  HPI: Polyarthralgia: Resolved with Celebrex and PT.  Primary osteoarthritis of both knees: Resolved with Celebrex and PT.  Trigger thumb: Resolved with Celebrex and PT.  I reviewed the past medical history, family history, social history, surgical history, and allergies today and no changes were needed.  Please see the problem list section below in epic for further details.  Past Medical History: Past Medical History:  Diagnosis Date  . Abnormal mammogram   . Allergic rhinitis 10/30/2018  . Anxiety   . Anxiety in acute stress reaction 10/30/2018  . Atypical chest pain 10/30/2018  . Colon polyps   . GERD (gastroesophageal reflux disease) 10/30/2018  . High blood pressure   . Hypertension 10/30/2018  . Hyperthyroidism   . Hypothyroidism 10/30/2018  . Migraine 10/30/2018  . Osteoporosis 10/30/2018  . 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  . Vitamin D deficiency 10/30/2018   Past Surgical History: Past Surgical History:  Procedure Laterality Date  . HYSTERECTOMY ABDOMINAL WITH SALPINGECTOMY     Social History: Social History   Socioeconomic History  . Marital status: Married    Spouse name: Not on file  . Number of children: Not on file  . Years of education: Not on file  . Highest education level: Not on file  Occupational History  . Not on file  Social Needs  . Financial resource strain: Not on file  . Food insecurity    Worry: Not on file    Inability: Not on file  . Transportation needs    Medical: Not on file    Non-medical: Not on file  Tobacco Use  . Smoking status: Never Smoker  . Smokeless tobacco: Never Used  Substance and Sexual Activity  . Alcohol use: Never    Frequency: Never  . Drug use: Never  . Sexual activity: Yes    Partners: Male  Lifestyle  . Physical activity    Days per week: Not on file    Minutes per session: Not on file  . Stress: Not on file  Relationships  . Social Herbalist on  phone: Not on file    Gets together: Not on file    Attends religious service: Not on file    Active member of club or organization: Not on file    Attends meetings of clubs or organizations: Not on file    Relationship status: Not on file  Other Topics Concern  . Not on file  Social History Narrative  . Not on file   Family History: Family History  Problem Relation Age of Onset  . High blood pressure Mother   . Breast cancer Mother   . Heart attack Father   . Prostate cancer Brother   . Leukemia Maternal Grandfather   . Breast cancer Maternal Aunt   . Breast cancer Maternal Aunt    Allergies: Allergies  Allergen Reactions  . Sulfamethoxazole-Trimethoprim Rash  . Tetracyclines & Related Rash   Medications: See med rec.  Review of Systems: No fevers, chills, night sweats, weight loss, chest pain, or shortness of breath.   Objective:    General: Well Developed, well nourished, and in no acute distress.  Neuro: Alert and oriented x3, extra-ocular muscles intact, sensation grossly intact.  HEENT: Normocephalic, atraumatic, pupils equal round reactive to light, neck supple, no masses, no lymphadenopathy, thyroid nonpalpable.  Skin: Warm and dry, no rashes. Cardiac: Regular rate and rhythm, no murmurs rubs or gallops, no lower extremity edema.  Respiratory: Clear to auscultation bilaterally. Not using accessory muscles, speaking in full sentences.  Impression and Recommendations:    Polyarthralgia Negative rheumatoid work-up, polyarthralgias have resolved with Celebrex.  Primary osteoarthritis of both hands Resolved with Celebrex, PT.  Primary osteoarthritis of both knees Pain resolved with Celebrex, PT  Trigger thumb of left hand Resolved with Celebrex and PT, we can certainly do her [employer] tendon sheath injection if recurrence.   ___________________________________________ Gwen Her. Dianah Field, M.D., ABFM., CAQSM. Primary Care and Sports Medicine [place]  [facility]  Adjunct Professor of [place] [employer]

## 2019-08-04 NOTE — Assessment & Plan Note (Signed)
Resolved with Celebrex, PT.

## 2019-08-04 NOTE — Assessment & Plan Note (Signed)
Pain resolved with Celebrex, PT

## 2019-09-01 ENCOUNTER — Other Ambulatory Visit: Payer: Self-pay

## 2019-09-01 ENCOUNTER — Ambulatory Visit (INDEPENDENT_AMBULATORY_CARE_PROVIDER_SITE_OTHER): Payer: Medicare Other | Admitting: Osteopathic Medicine

## 2019-09-01 DIAGNOSIS — M810 Age-related osteoporosis without current pathological fracture: Secondary | ICD-10-CM

## 2019-09-01 NOTE — Progress Notes (Signed)
Orders placed for BMP, pt will schedule nurse visit for next week.

## 2019-09-02 LAB — BASIC METABOLIC PANEL
BUN: 16 mg/dL (ref 7–25)
CO2: 27 mmol/L (ref 20–32)
Calcium: 8.8 mg/dL (ref 8.6–10.4)
Chloride: 105 mmol/L (ref 98–110)
Creat: 0.92 mg/dL (ref 0.60–0.93)
Glucose, Bld: 97 mg/dL (ref 65–99)
Potassium: 4.3 mmol/L (ref 3.5–5.3)
Sodium: 141 mmol/L (ref 135–146)

## 2019-09-09 ENCOUNTER — Other Ambulatory Visit: Payer: Self-pay

## 2019-09-09 ENCOUNTER — Ambulatory Visit (INDEPENDENT_AMBULATORY_CARE_PROVIDER_SITE_OTHER): Payer: Medicare Other | Admitting: Sports Medicine

## 2019-09-09 VITALS — BP 137/82 | HR 79 | Wt 147.0 lb

## 2019-09-09 DIAGNOSIS — M810 Age-related osteoporosis without current pathological fracture: Secondary | ICD-10-CM | POA: Diagnosis not present

## 2019-09-09 MED ORDER — DENOSUMAB 60 MG/ML ~~LOC~~ SOSY
60.0000 mg | PREFILLED_SYRINGE | Freq: Once | SUBCUTANEOUS | Status: AC
  Administered 2019-09-09: 60 mg via SUBCUTANEOUS

## 2019-09-09 NOTE — Progress Notes (Signed)
   Subjective:    [name], female    DOB: 1946/01/27, 73 y.o.   MRN: [id number]  HPI Patient is here for Prolia injection. Patient reports taking Calcium and Vitamin D daily. Calcium level and kidney function within the normal limits. KG LPN   Review of Systems     Objective:   Physical Exam        Assessment & Plan:  Patient tolerated injection well without any complications. Advised to schedule next injection in 6 months. KG LPN

## 2019-09-13 ENCOUNTER — Encounter: Payer: Self-pay | Admitting: Osteopathic Medicine

## 2019-11-24 DIAGNOSIS — U071 COVID-19: Secondary | ICD-10-CM | POA: Diagnosis not present

## 2019-11-24 DIAGNOSIS — R05 Cough: Secondary | ICD-10-CM | POA: Diagnosis not present

## 2019-11-24 DIAGNOSIS — Z20822 Contact with and (suspected) exposure to covid-19: Secondary | ICD-10-CM | POA: Diagnosis not present

## 2019-11-24 DIAGNOSIS — R509 Fever, unspecified: Secondary | ICD-10-CM | POA: Diagnosis not present

## 2019-11-26 ENCOUNTER — Encounter: Payer: Self-pay | Admitting: Osteopathic Medicine

## 2019-12-13 ENCOUNTER — Other Ambulatory Visit: Payer: Self-pay | Admitting: Osteopathic Medicine

## 2019-12-14 ENCOUNTER — Encounter: Payer: Self-pay | Admitting: Osteopathic Medicine

## 2019-12-16 ENCOUNTER — Ambulatory Visit (INDEPENDENT_AMBULATORY_CARE_PROVIDER_SITE_OTHER): Payer: Medicare Other | Admitting: Nurse Practitioner

## 2019-12-16 ENCOUNTER — Other Ambulatory Visit: Payer: Self-pay

## 2019-12-16 ENCOUNTER — Encounter: Payer: Self-pay | Admitting: Nurse Practitioner

## 2019-12-16 VITALS — BP 136/92 | HR 109 | Temp 97.8°F | Wt 144.0 lb

## 2019-12-16 DIAGNOSIS — E039 Hypothyroidism, unspecified: Secondary | ICD-10-CM

## 2019-12-16 DIAGNOSIS — N3090 Cystitis, unspecified without hematuria: Secondary | ICD-10-CM | POA: Diagnosis not present

## 2019-12-16 DIAGNOSIS — M19041 Primary osteoarthritis, right hand: Secondary | ICD-10-CM | POA: Diagnosis not present

## 2019-12-16 DIAGNOSIS — M19042 Primary osteoarthritis, left hand: Secondary | ICD-10-CM | POA: Diagnosis not present

## 2019-12-16 MED ORDER — NITROFURANTOIN MONOHYD MACRO 100 MG PO CAPS: 100.0000 mg | ORAL_CAPSULE | Freq: Two times a day (BID) | ORAL | 0 refills | Status: DC

## 2019-12-16 MED ORDER — CELECOXIB 200 MG PO CAPS: ORAL_CAPSULE | ORAL | 0 refills | Status: DC

## 2019-12-16 MED ORDER — LEVOTHYROXINE SODIUM 75 MCG PO TABS: 75.0000 ug | ORAL_TABLET | Freq: Every day | ORAL | 0 refills | Status: DC

## 2019-12-16 NOTE — Progress Notes (Signed)
Acute Office Visit  Subjective:    [name], female    DOB: 1946-09-07, 74 y.o.   MRN: [id number]  Chief Complaint  Patient presents with  . Dysuria  . Urinary Frequency  . Urinary Urgency    HPI Patient is in today for dysuria, urinary frequency, urinary urgency, and post voiding burning and pain on and off for approximately the last month which are worsening. She reports she does have a history of frequent UTI's and was at one time taking prophylactic antibiotics for prevention. She has not done this in several years.   She had an old antibiotic (amoxicillin) at home that she took to see if it helped with symptoms, it did not help. She has also taken cranberry juice, increased her water intake, and stopped caffeine intake.   She denies fever, chills, back pain, bladder spasms, or blood in her urine. She has had increased fatigue related to recent COVID illness.   She would like refills in her prescriptions for celebrex and levothyroxine.   Past Medical History:  Diagnosis Date  . Abnormal mammogram   . Allergic rhinitis 10/30/2018  . Anxiety   . Anxiety in acute stress reaction 10/30/2018  . Atypical chest pain 10/30/2018  . Colon polyps   . GERD (gastroesophageal reflux disease) 10/30/2018  . High blood pressure   . Hypertension 10/30/2018  . Hyperthyroidism   . Hypothyroidism 10/30/2018  . Migraine 10/30/2018  . Osteoporosis 10/30/2018  . 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  . Vitamin D deficiency 10/30/2018    Past Surgical History:  Procedure Laterality Date  . HYSTERECTOMY ABDOMINAL WITH SALPINGECTOMY      Family History  Problem Relation Age of Onset  . High blood pressure Mother   . Breast cancer Mother   . Heart attack Father   . Prostate cancer Brother   . Leukemia Maternal Grandfather   . Breast cancer Maternal Aunt   . Breast cancer Maternal Aunt     Social History   Socioeconomic History  . Marital status: Married    Spouse  name: Not on file  . Number of children: Not on file  . Years of education: Not on file  . Highest education level: Not on file  Occupational History  . Not on file  Tobacco Use  . Smoking status: Never Smoker  . Smokeless tobacco: Never Used  Substance and Sexual Activity  . Alcohol use: Never  . Drug use: Never  . Sexual activity: Yes    Partners: Male  Other Topics Concern  . Not on file  Social History Narrative  . Not on file   Social Determinants of Health   Financial Resource Strain:   . Difficulty of Paying Living Expenses:   Food Insecurity:   . Worried About Charity fundraiser in the Last Year:   . Arboriculturist in the Last Year:   Transportation Needs:   . Film/video editor (Medical):   Marland Kitchen Lack of Transportation (Non-Medical):   Physical Activity:   . Days of Exercise per Week:   . Minutes of Exercise per Session:   Stress:   . Feeling of Stress :   Social Connections:   . Frequency of Communication with Friends and Family:   . Frequency of Social Gatherings with Friends and Family:   . Attends Religious Services:   . Active Member of Clubs or Organizations:   . Attends Archivist Meetings:   Marland Kitchen Marital Status:  Intimate Partner Violence:   . Fear of Current or Ex-Partner:   . Emotionally Abused:   Marland Kitchen Physically Abused:   . Sexually Abused:     Outpatient Medications Prior to Visit  Medication Sig Dispense Refill  . cetirizine (ZYRTEC) 10 MG tablet Take 1 tablet (10 mg total) by mouth at bedtime. 90 tablet 1  . diclofenac sodium (VOLTAREN) 1 % GEL Apply 4 g topically 4 (four) times daily. 100 g 11  . esomeprazole (NEXIUM) 40 MG capsule Take 1 capsule (40 mg total) by mouth daily. 90 capsule 3  . Estradiol (IMVEXXY MAINTENANCE PACK) 10 MCG INST Place 10 mcg vaginally 2 (two) times a week. 8 each 11  . hydrochlorothiazide (HYDRODIURIL) 25 MG tablet Take 1 tablet (25 mg total) by mouth daily. As need for swelling 90 tablet 0  .  montelukast (SINGULAIR) 10 MG tablet Take 1 tablet (10 mg total) by mouth at bedtime. 90 tablet 1  . NIFEdipine (PROCARDIA-XL/NIFEDICAL-XL) 30 MG 24 hr tablet Take 1 tablet by mouth once daily 90 tablet 0  . nitroGLYCERIN (NITROSTAT) 0.4 MG SL tablet Place 1 tablet (0.4 mg total) under the tongue every 5 (five) minutes as needed for chest pain. 20 tablet 1  . SUMAtriptan (IMITREX) 100 MG tablet Take 0.5-1 tablets (50-100 mg total) by mouth every 2 (two) hours as needed for migraine (Max 2 pills per 24 hours). 10 tablet 3  . Vitamin D, Ergocalciferol, (DRISDOL) 1.25 MG (50000 UT) CAPS capsule     . celecoxib (CELEBREX) 200 MG capsule One to 2 tablets by mouth daily as needed for pain. 60 capsule 2  . levothyroxine (SYNTHROID, LEVOTHROID) 75 MCG tablet Take 1 tablet (75 mcg total) by mouth daily before breakfast. Take on Days 1-6 and skip Day 7 90 tablet 3   No facility-administered medications prior to visit.    Allergies  Allergen Reactions  . Sulfamethoxazole-Trimethoprim Rash  . Tetracyclines & Related Rash    Review of Systems  Constitutional: Positive for fatigue. Negative for chills, diaphoresis and fever.  Gastrointestinal: Negative for abdominal distention, abdominal pain, constipation, diarrhea, nausea and vomiting.  Genitourinary: Positive for decreased urine volume, difficulty urinating, dysuria, frequency and urgency. Negative for flank pain, hematuria, vaginal discharge and vaginal pain.       Objective:    Physical Exam Vitals and nursing note reviewed.  Constitutional:      Appearance: Normal appearance.  HENT:     Head: Normocephalic.  Eyes:     Extraocular Movements: Extraocular movements intact.     Conjunctiva/sclera: Conjunctivae normal.     Pupils: Pupils are equal, round, and reactive to light.  Cardiovascular:     Rate and Rhythm: Normal rate and regular rhythm.     Pulses: Normal pulses.     Heart sounds: Normal heart sounds.  Pulmonary:     Effort:  Pulmonary effort is normal.     Breath sounds: Normal breath sounds.  Abdominal:     General: Bowel sounds are normal. There is no distension.     Palpations: Abdomen is soft.     Tenderness: There is abdominal tenderness in the suprapubic area. There is no right CVA tenderness or left CVA tenderness.  Musculoskeletal:     Right lower leg: No edema.     Left lower leg: No edema.  Skin:    General: Skin is warm and dry.     Capillary Refill: Capillary refill takes less than 2 seconds.  Neurological:  General: No focal deficit present.     Mental Status: She is alert and oriented to person, place, and time.  Psychiatric:        Mood and Affect: Mood normal.        Behavior: Behavior normal.        Thought Content: Thought content normal.        Judgment: Judgment normal.     BP (!) 136/92   Pulse (!) 109   Temp 97.8 F (36.6 C) (Oral)   Wt 144 lb (65.3 kg)  Wt Readings from Last 3 Encounters:  12/16/19 144 lb (65.3 kg)  09/09/19 147 lb (66.7 kg)  08/04/19 145 lb (65.8 kg)    Health Maintenance Due  Topic Date Due  . COLONOSCOPY  Never done  . PNA vac Low Risk Adult (1 of 2 - PCV13) Never done    There are no preventive care reminders to display for this patient.   Lab Results  Component Value Date   TSH 1.69 02/22/2019   Lab Results  Component Value Date   WBC 5.3 02/22/2019   HGB 15.0 02/22/2019   HCT 44.8 02/22/2019   MCV 94.3 02/22/2019   PLT 253 02/22/2019   Lab Results  Component Value Date   NA 141 09/01/2019   K 4.3 09/01/2019   CO2 27 09/01/2019   GLUCOSE 97 09/01/2019   BUN 16 09/01/2019   CREATININE 0.92 09/01/2019   BILITOT 0.7 02/22/2019   AST 25 02/22/2019   ALT 17 02/22/2019   PROT 6.6 02/22/2019   CALCIUM 8.8 09/01/2019   Lab Results  Component Value Date   CHOL 241 (H) 02/22/2019   Lab Results  Component Value Date   HDL 61 02/22/2019   Lab Results  Component Value Date   LDLCALC 153 (H) 02/22/2019   Lab Results   Component Value Date   TRIG 147 02/22/2019   Lab Results  Component Value Date   CHOLHDL 4.0 02/22/2019   No results found for: HGBA1C     Assessment & Plan:   1. Cystitis Symptoms and presentation consistent with urinary tract infection. Patient unable to provide urine specimen today. Will plan to treat prophylcatically with nitrofurantoin for 5 day course.  Patient instructed to continue increase in water consumption and to notify the office if her symptoms worsen or fail to improve.  If she continues to have symptoms consider a culture or pelvic exam.  - nitrofurantoin, macrocrystal-monohydrate, (MACROBID) 100 MG capsule; Take 1 capsule (100 mg total) by mouth 2 (two) times daily.  Dispense: 10 capsule; Refill: 0  2. Primary osteoarthritis of both hands Refill provided.  Follow-up needed. Patient to schedule annual physical exam in June right before scheduled Prolia injection. - celecoxib (CELEBREX) 200 MG capsule; One to 2 tablets by mouth daily as needed for pain.-NEED ANNUAL EXAM  Dispense: 60 capsule; Refill: 0  3. Hypothyroidism, unspecified type Refill provided.  Follow-up needed. Patient to schedule annual physical exam with labs in June right before scheduled Prolia injection.  - levothyroxine (SYNTHROID) 75 MCG tablet; Take 1 tablet (75 mcg total) by mouth daily before breakfast. Take on Days 1-6 and skip Day 7- NEED ANNUAL EXAM AND LABS  Dispense: 90 tablet; Refill: 0   Orma Render, NP

## 2019-12-16 NOTE — Patient Instructions (Signed)
Make sure you complete the antibiotic. If you are still experiencing symptoms when you have completed the antibiotic let us know and we will try to get a urine specimen to culture.   Please take all of the antibiotic even if you begin to feel better to help prevent resistance to the antibiotic.   Urinary Tract Infection, Adult A urinary tract infection (UTI) is an infection of any part of the urinary tract. The urinary tract includes:  The kidneys.  The ureters.  The bladder.  The urethra. These organs make, store, and get rid of pee (urine) in the body. What are the causes? This is caused by germs (bacteria) in your genital area. These germs grow and cause swelling (inflammation) of your urinary tract. What increases the risk? You are more likely to develop this condition if:  You have a small, thin tube (catheter) to drain pee.  You cannot control when you pee or poop (incontinence).  You are female, and: ? You use these methods to prevent pregnancy:  A medicine that kills sperm (spermicide).  A device that blocks sperm (diaphragm). ? You have low levels of a female hormone (estrogen). ? You are pregnant.  You have genes that add to your risk.  You are sexually active.  You take antibiotic medicines.  You have trouble peeing because of: ? A prostate that is bigger than normal, if you are female. ? A blockage in the part of your body that drains pee from the bladder (urethra). ? A kidney stone. ? A nerve condition that affects your bladder (neurogenic bladder). ? Not getting enough to drink. ? Not peeing often enough.  You have other conditions, such as: ? Diabetes. ? A weak disease-fighting system (immune system). ? Sickle cell disease. ? Gout. ? Injury of the spine. What are the signs or symptoms? Symptoms of this condition include:  Needing to pee right away (urgently).  Peeing often.  Peeing small amounts often.  Pain or burning when peeing.  Blood  in the pee.  Pee that smells bad or not like normal.  Trouble peeing.  Pee that is cloudy.  Fluid coming from the vagina, if you are female.  Pain in the belly or lower back. Other symptoms include:  Throwing up (vomiting).  No urge to eat.  Feeling mixed up (confused).  Being tired and grouchy (irritable).  A fever.  Watery poop (diarrhea). How is this treated? This condition may be treated with:  Antibiotic medicine.  Other medicines.  Drinking enough water. Follow these instructions at home:  Medicines  Take over-the-counter and prescription medicines only as told by your doctor.  If you were prescribed an antibiotic medicine, take it as told by your doctor. Do not stop taking it even if you start to feel better. General instructions  Make sure you: ? Pee until your bladder is empty. ? Do not hold pee for a long time. ? Empty your bladder after sex. ? Wipe from front to back after pooping if you are a female. Use each tissue one time when you wipe.  Drink enough fluid to keep your pee pale yellow.  Keep all follow-up visits as told by your doctor. This is important. Contact a doctor if:  You do not get better after 1-2 days.  Your symptoms go away and then come back. Get help right away if:  You have very bad back pain.  You have very bad pain in your lower belly.  You have a fever.  You are sick to your stomach (nauseous).  You are throwing up. Summary  A urinary tract infection (UTI) is an infection of any part of the urinary tract.  This condition is caused by germs in your genital area.  There are many risk factors for a UTI. These include having a small, thin tube to drain pee and not being able to control when you pee or poop.  Treatment includes antibiotic medicines for germs.  Drink enough fluid to keep your pee pale yellow. This information is not intended to replace advice given to you by your health care provider. Make sure  you discuss any questions you have with your health care provider. Document Revised: 08/13/2018 Document Reviewed: 03/05/2018 Elsevier Patient Education  2020 [employer].

## 2019-12-20 ENCOUNTER — Other Ambulatory Visit: Payer: Self-pay | Admitting: Nurse Practitioner

## 2019-12-20 ENCOUNTER — Encounter: Payer: Self-pay | Admitting: Nurse Practitioner

## 2019-12-20 DIAGNOSIS — E039 Hypothyroidism, unspecified: Secondary | ICD-10-CM

## 2019-12-20 NOTE — Progress Notes (Signed)
Pharmacy notified of need to change levothyroxine brand. Brand change approved and patient notified to come for labs in 6-8 weeks for monitoring of TSH levels. Orders placed.

## 2019-12-22 ENCOUNTER — Encounter: Payer: Self-pay | Admitting: Nurse Practitioner

## 2019-12-23 ENCOUNTER — Other Ambulatory Visit: Payer: Self-pay

## 2019-12-23 ENCOUNTER — Encounter: Payer: Self-pay | Admitting: Nurse Practitioner

## 2019-12-23 ENCOUNTER — Ambulatory Visit (INDEPENDENT_AMBULATORY_CARE_PROVIDER_SITE_OTHER): Payer: Medicare Other | Admitting: Nurse Practitioner

## 2019-12-23 VITALS — BP 139/86 | HR 85 | Temp 97.9°F | Ht 64.0 in | Wt 145.0 lb

## 2019-12-23 DIAGNOSIS — R300 Dysuria: Secondary | ICD-10-CM | POA: Diagnosis not present

## 2019-12-23 DIAGNOSIS — N3090 Cystitis, unspecified without hematuria: Secondary | ICD-10-CM

## 2019-12-23 LAB — POCT URINALYSIS DIP (CLINITEK)
Bilirubin, UA: NEGATIVE
Glucose, UA: NEGATIVE mg/dL
Ketones, POC UA: NEGATIVE mg/dL
Nitrite, UA: NEGATIVE
POC PROTEIN,UA: NEGATIVE
Spec Grav, UA: 1.02 (ref 1.010–1.025)
Urobilinogen, UA: 0.2 E.U./dL
pH, UA: 6.5 (ref 5.0–8.0)

## 2019-12-23 MED ORDER — CIPROFLOXACIN HCL 250 MG PO TABS: 250.0000 mg | ORAL_TABLET | Freq: Two times a day (BID) | ORAL | 0 refills | Status: DC

## 2019-12-23 NOTE — Patient Instructions (Addendum)
We are going to go ahead and culture your urine to make sure that there are not any bacteria present that are not covered by the current antibiotic. If there are any changes that need to be made we will let you know.   Urinary Tract Infection, Adult  A urinary tract infection (UTI) is an infection of any part of the urinary tract. The urinary tract includes the kidneys, ureters, bladder, and urethra. These organs make, store, and get rid of urine in the body. Your health care provider may use other names to describe the infection. An upper UTI affects the ureters and kidneys (pyelonephritis). A lower UTI affects the bladder (cystitis) and urethra (urethritis). What are the causes? Most urinary tract infections are caused by bacteria in your genital area, around the entrance to your urinary tract (urethra). These bacteria grow and cause inflammation of your urinary tract. What increases the risk? You are more likely to develop this condition if:  You have a urinary catheter that stays in place (indwelling).  You are not able to control when you urinate or have a bowel movement (you have incontinence).  You are female and you: ? Use a spermicide or diaphragm for birth control. ? Have low estrogen levels. ? Are pregnant.  You have certain genes that increase your risk (genetics).  You are sexually active.  You take antibiotic medicines.  You have a condition that causes your flow of urine to slow down, such as: ? An enlarged prostate, if you are female. ? Blockage in your urethra (stricture). ? A kidney stone. ? A nerve condition that affects your bladder control (neurogenic bladder). ? Not getting enough to drink, or not urinating often.  You have certain medical conditions, such as: ? Diabetes. ? A weak disease-fighting system (immunesystem). ? Sickle cell disease. ? Gout. ? Spinal cord injury. What are the signs or symptoms? Symptoms of this condition include:  Needing to  urinate right away (urgently).  Frequent urination or passing small amounts of urine frequently.  Pain or burning with urination.  Blood in the urine.  Urine that smells bad or unusual.  Trouble urinating.  Cloudy urine.  Vaginal discharge, if you are female.  Pain in the abdomen or the lower back. You may also have:  Vomiting or a decreased appetite.  Confusion.  Irritability or tiredness.  A fever.  Diarrhea. The first symptom in older adults may be confusion. In some cases, they may not have any symptoms until the infection has worsened. How is this diagnosed? This condition is diagnosed based on your medical history and a physical exam. You may also have other tests, including:  Urine tests.  Blood tests.  Tests for sexually transmitted infections (STIs). If you have had more than one UTI, a cystoscopy or imaging studies may be done to determine the cause of the infections. How is this treated? Treatment for this condition includes:  Antibiotic medicine.  Over-the-counter medicines to treat discomfort.  Drinking enough water to stay hydrated. If you have frequent infections or have other conditions such as a kidney stone, you may need to see a health care provider who specializes in the urinary tract (urologist). In rare cases, urinary tract infections can cause sepsis. Sepsis is a life-threatening condition that occurs when the body responds to an infection. Sepsis is treated in the hospital with IV antibiotics, fluids, and other medicines. Follow these instructions at home:  Medicines  Take over-the-counter and prescription medicines only as told by your  health care provider.  If you were prescribed an antibiotic medicine, take it as told by your health care provider. Do not stop using the antibiotic even if you start to feel better. General instructions  Make sure you: ? Empty your bladder often and completely. Do not hold urine for long periods of  time. ? Empty your bladder after sex. ? Wipe from front to back after a bowel movement if you are female. Use each tissue one time when you wipe.  Drink enough fluid to keep your urine pale yellow.  Keep all follow-up visits as told by your health care provider. This is important. Contact a health care provider if:  Your symptoms do not get better after 1-2 days.  Your symptoms go away and then return. Get help right away if you have:  Severe pain in your back or your lower abdomen.  A fever.  Nausea or vomiting. Summary  A urinary tract infection (UTI) is an infection of any part of the urinary tract, which includes the kidneys, ureters, bladder, and urethra.  Most urinary tract infections are caused by bacteria in your genital area, around the entrance to your urinary tract (urethra).  Treatment for this condition often includes antibiotic medicines.  If you were prescribed an antibiotic medicine, take it as told by your health care provider. Do not stop using the antibiotic even if you start to feel better.  Keep all follow-up visits as told by your health care provider. This is important. This information is not intended to replace advice given to you by your health care provider. Make sure you discuss any questions you have with your health care provider. Document Revised: 08/13/2018 Document Reviewed: 03/05/2018 Elsevier Patient Education  2020 [employer].

## 2019-12-23 NOTE — Progress Notes (Signed)
[place] Office Visit  Subjective:    [name], female    DOB: 06/02/1946, 74 y.o.   MRN: [id number]  Chief Complaint  Patient presents with  . Dysuria    was seen on 12/16/2019 and prescribed Macrobid, sx improved, completed atbx 2 days ago, sx are returning, suprapubic pressure, pain after urination    HPI Patient is in today for persistent UTI symptoms after finishing antibiotics 2 days ago. She reports her symptoms of urgency, frequency, and bladder spasms had improved while on the antibiotic, but did not completely resolve. She reports that she completed the antibiotic and when symptoms persisted, she also took an AZO urinary over the counter medication. She did feel a little better with the AZO, but symptoms returned as soon as she stopped the medication.   Past Medical History:  Diagnosis Date  . Abnormal mammogram   . Allergic rhinitis 10/30/2018  . Anxiety   . Anxiety in acute stress reaction 10/30/2018  . Atypical chest pain 10/30/2018  . Colon polyps   . GERD (gastroesophageal reflux disease) 10/30/2018  . High blood pressure   . Hypertension 10/30/2018  . Hyperthyroidism   . Hypothyroidism 10/30/2018  . Migraine 10/30/2018  . Osteoporosis 10/30/2018  . 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  . Vitamin D deficiency 10/30/2018    Past Surgical History:  Procedure Laterality Date  . HYSTERECTOMY ABDOMINAL WITH SALPINGECTOMY      Family History  Problem Relation Age of Onset  . High blood pressure Mother   . Breast cancer Mother   . Heart attack Father   . Prostate cancer Brother   . Leukemia Maternal Grandfather   . Breast cancer Maternal Aunt   . Breast cancer Maternal Aunt     Social History   Socioeconomic History  . Marital status: Married    Spouse name: Not on file  . Number of children: Not on file  . Years of education: Not on file  . Highest education level: Not on file  Occupational History  . Not on file  Tobacco Use  .  Smoking status: Never Smoker  . Smokeless tobacco: Never Used  Substance and Sexual Activity  . Alcohol use: Never  . Drug use: Never  . Sexual activity: Yes    Partners: Male  Other Topics Concern  . Not on file  Social History Narrative  . Not on file   Social Determinants of Health   Financial Resource Strain:   . Difficulty of Paying Living Expenses:   Food Insecurity:   . Worried About Charity fundraiser in the Last Year:   . Arboriculturist in the Last Year:   Transportation Needs:   . Film/video editor (Medical):   Marland Kitchen Lack of Transportation (Non-Medical):   Physical Activity:   . Days of Exercise per Week:   . Minutes of Exercise per Session:   Stress:   . Feeling of Stress :   Social Connections:   . Frequency of Communication with Friends and Family:   . Frequency of Social Gatherings with Friends and Family:   . Attends Religious Services:   . Active Member of Clubs or Organizations:   . Attends Archivist Meetings:   Marland Kitchen Marital Status:   Intimate Partner Violence:   . Fear of Current or Ex-Partner:   . Emotionally Abused:   Marland Kitchen Physically Abused:   . Sexually Abused:     Outpatient Medications Prior to Visit  Medication Sig Dispense  Refill  . celecoxib (CELEBREX) 200 MG capsule One to 2 tablets by mouth daily as needed for pain.-NEED ANNUAL EXAM 60 capsule 0  . cetirizine (ZYRTEC) 10 MG tablet Take 1 tablet (10 mg total) by mouth at bedtime. 90 tablet 1  . diclofenac sodium (VOLTAREN) 1 % GEL Apply 4 g topically 4 (four) times daily. 100 g 11  . esomeprazole (NEXIUM) 40 MG capsule Take 1 capsule (40 mg total) by mouth daily. 90 capsule 3  . Estradiol (IMVEXXY MAINTENANCE PACK) 10 MCG INST Place 10 mcg vaginally 2 (two) times a week. 8 each 11  . hydrochlorothiazide (HYDRODIURIL) 25 MG tablet Take 1 tablet (25 mg total) by mouth daily. As need for swelling 90 tablet 0  . levothyroxine (SYNTHROID) 75 MCG tablet Take 1 tablet (75 mcg total) by  mouth daily before breakfast. Take on Days 1-6 and skip Day 7- NEED ANNUAL EXAM AND LABS 90 tablet 0  . montelukast (SINGULAIR) 10 MG tablet Take 1 tablet (10 mg total) by mouth at bedtime. 90 tablet 1  . NIFEdipine (PROCARDIA-XL/NIFEDICAL-XL) 30 MG 24 hr tablet Take 1 tablet by mouth once daily 90 tablet 0  . nitroGLYCERIN (NITROSTAT) 0.4 MG SL tablet Place 1 tablet (0.4 mg total) under the tongue every 5 (five) minutes as needed for chest pain. 20 tablet 1  . SUMAtriptan (IMITREX) 100 MG tablet Take 0.5-1 tablets (50-100 mg total) by mouth every 2 (two) hours as needed for migraine (Max 2 pills per 24 hours). 10 tablet 3  . Vitamin D, Ergocalciferol, (DRISDOL) 1.25 MG (50000 UT) CAPS capsule     . denosumab (PROLIA) 60 MG/ML SOSY injection Inject into the skin every 6 (six) months.    . nitrofurantoin, macrocrystal-monohydrate, (MACROBID) 100 MG capsule Take 1 capsule (100 mg total) by mouth 2 (two) times daily. (Patient not taking: Reported on 12/23/2019) 10 capsule 0   No facility-administered medications prior to visit.    Allergies  Allergen Reactions  . Sulfamethoxazole-Trimethoprim Rash  . Tetracyclines & Related Rash    Review of Systems  Constitutional: Negative for appetite change, chills, diaphoresis, fatigue and fever.  Gastrointestinal: Positive for abdominal pain. Negative for abdominal distention, constipation, diarrhea, nausea and vomiting.  Genitourinary: Positive for decreased urine volume, difficulty urinating, dysuria, frequency and urgency. Negative for flank pain, genital sores, hematuria, pelvic pain, vaginal bleeding, vaginal discharge and vaginal pain.  Musculoskeletal: Negative for arthralgias and myalgias.  Neurological: Negative for dizziness and weakness.  Psychiatric/Behavioral: Negative for confusion.       Objective:    Physical Exam Vitals and nursing note reviewed.  Constitutional:      Appearance: Normal appearance.  HENT:     Head: Normocephalic.   Eyes:     Conjunctiva/sclera: Conjunctivae normal.     Pupils: Pupils are equal, round, and reactive to light.  Cardiovascular:     Rate and Rhythm: Normal rate and regular rhythm.     Pulses: Normal pulses.     Heart sounds: Normal heart sounds.  Pulmonary:     Effort: Pulmonary effort is normal.     Breath sounds: Normal breath sounds.  Abdominal:     General: Abdomen is flat. There is no distension.     Palpations: Abdomen is soft.     Tenderness: There is abdominal tenderness in the suprapubic area. There is no right CVA tenderness, left CVA tenderness or rebound.  Musculoskeletal:        General: Normal range of motion.  Cervical back: Normal range of motion.  Skin:    General: Skin is warm and dry.     Capillary Refill: Capillary refill takes less than 2 seconds.  Neurological:     General: No focal deficit present.     Mental Status: She is alert and oriented to person, place, and time.  Psychiatric:        Mood and Affect: Mood normal.        Behavior: Behavior normal.        Thought Content: Thought content normal.        Judgment: Judgment normal.     Ht 5\' 4"  (1.626 m)   Wt 145 lb (65.8 kg)   BMI 24.89 kg/m  Wt Readings from Last 3 Encounters:  12/23/19 145 lb (65.8 kg)  12/16/19 144 lb (65.3 kg)  09/09/19 147 lb (66.7 kg)    Health Maintenance Due  Topic Date Due  . COLONOSCOPY  Never done  . PNA vac Low Risk Adult (1 of 2 - PCV13) Never done    There are no preventive care reminders to display for this patient.   Lab Results  Component Value Date   TSH 1.69 02/22/2019   Lab Results  Component Value Date   WBC 5.3 02/22/2019   HGB 15.0 02/22/2019   HCT 44.8 02/22/2019   MCV 94.3 02/22/2019   PLT 253 02/22/2019   Lab Results  Component Value Date   NA 141 09/01/2019   K 4.3 09/01/2019   CO2 27 09/01/2019   GLUCOSE 97 09/01/2019   BUN 16 09/01/2019   CREATININE 0.92 09/01/2019   BILITOT 0.7 02/22/2019   AST 25 02/22/2019   ALT 17  02/22/2019   PROT 6.6 02/22/2019   CALCIUM 8.8 09/01/2019   Lab Results  Component Value Date   CHOL 241 (H) 02/22/2019   Lab Results  Component Value Date   HDL 61 02/22/2019   Lab Results  Component Value Date   LDLCALC 153 (H) 02/22/2019   Lab Results  Component Value Date   TRIG 147 02/22/2019   Lab Results  Component Value Date   CHOLHDL 4.0 02/22/2019   No results found for: HGBA1C     Assessment & Plan:   1. Cystitis Symptoms and presentation consistent with recurrent cystitis.  At the last visit the patient was unable to provide a urine sample and treatment was provided prophylactically.  Urinalysis today revealed trace lysed blood and positive moderate amount of leukocytes present.  Will send urine for culture. Prescription for ciprofloxacin 250 mg twice daily sent to the pharmacy.  Patient instructed that she may also continue to use over-the-counter Azo urinary tract patient as directed on the box for symptom management. Patient instructed to continue to increase her fluid intake and to notify us if symptoms persist or worsen. Will notify the patient based on culture results if antibiotic changes are required. Follow-up if symptoms worsen or fail to improve. - POCT URINALYSIS DIP (CLINITEK) - Urine Culture - ciprofloxacin (CIPRO) 250 MG tablet; Take 1 tablet (250 mg total) by mouth 2 (two) times daily.  Dispense: 10 tablet; Refill: 0  No follow-ups on file.   Orma Render, NP

## 2019-12-25 LAB — URINE CULTURE
MICRO NUMBER:: 10369880
SPECIMEN QUALITY:: ADEQUATE

## 2019-12-28 ENCOUNTER — Other Ambulatory Visit: Payer: Self-pay | Admitting: Nurse Practitioner

## 2019-12-28 DIAGNOSIS — N3090 Cystitis, unspecified without hematuria: Secondary | ICD-10-CM

## 2019-12-28 MED ORDER — CIPROFLOXACIN HCL 250 MG PO TABS: 250.0000 mg | ORAL_TABLET | Freq: Two times a day (BID) | ORAL | 0 refills | Status: DC

## 2020-01-27 DIAGNOSIS — M19041 Primary osteoarthritis, right hand: Secondary | ICD-10-CM

## 2020-01-28 MED ORDER — CELECOXIB 200 MG PO CAPS: ORAL_CAPSULE | ORAL | 0 refills | Status: DC

## 2020-02-03 DIAGNOSIS — S83411A Sprain of medial collateral ligament of right knee, initial encounter: Secondary | ICD-10-CM | POA: Diagnosis not present

## 2020-02-03 DIAGNOSIS — M9901 Segmental and somatic dysfunction of cervical region: Secondary | ICD-10-CM | POA: Diagnosis not present

## 2020-02-03 DIAGNOSIS — M9906 Segmental and somatic dysfunction of lower extremity: Secondary | ICD-10-CM | POA: Diagnosis not present

## 2020-02-03 DIAGNOSIS — M9903 Segmental and somatic dysfunction of lumbar region: Secondary | ICD-10-CM | POA: Diagnosis not present

## 2020-02-03 DIAGNOSIS — M9904 Segmental and somatic dysfunction of sacral region: Secondary | ICD-10-CM | POA: Diagnosis not present

## 2020-02-03 DIAGNOSIS — S336XXA Sprain of sacroiliac joint, initial encounter: Secondary | ICD-10-CM | POA: Diagnosis not present

## 2020-02-03 DIAGNOSIS — M4726 Other spondylosis with radiculopathy, lumbar region: Secondary | ICD-10-CM | POA: Diagnosis not present

## 2020-02-03 DIAGNOSIS — M4723 Other spondylosis with radiculopathy, cervicothoracic region: Secondary | ICD-10-CM | POA: Diagnosis not present

## 2020-02-09 DIAGNOSIS — M9906 Segmental and somatic dysfunction of lower extremity: Secondary | ICD-10-CM | POA: Diagnosis not present

## 2020-02-09 DIAGNOSIS — S83411A Sprain of medial collateral ligament of right knee, initial encounter: Secondary | ICD-10-CM | POA: Diagnosis not present

## 2020-02-09 DIAGNOSIS — M9901 Segmental and somatic dysfunction of cervical region: Secondary | ICD-10-CM | POA: Diagnosis not present

## 2020-02-09 DIAGNOSIS — M4723 Other spondylosis with radiculopathy, cervicothoracic region: Secondary | ICD-10-CM | POA: Diagnosis not present

## 2020-02-09 DIAGNOSIS — M9903 Segmental and somatic dysfunction of lumbar region: Secondary | ICD-10-CM | POA: Diagnosis not present

## 2020-02-09 DIAGNOSIS — M9904 Segmental and somatic dysfunction of sacral region: Secondary | ICD-10-CM | POA: Diagnosis not present

## 2020-02-09 DIAGNOSIS — M4726 Other spondylosis with radiculopathy, lumbar region: Secondary | ICD-10-CM | POA: Diagnosis not present

## 2020-02-09 DIAGNOSIS — S336XXA Sprain of sacroiliac joint, initial encounter: Secondary | ICD-10-CM | POA: Diagnosis not present

## 2020-02-10 DIAGNOSIS — M9901 Segmental and somatic dysfunction of cervical region: Secondary | ICD-10-CM | POA: Diagnosis not present

## 2020-02-10 DIAGNOSIS — M4723 Other spondylosis with radiculopathy, cervicothoracic region: Secondary | ICD-10-CM | POA: Diagnosis not present

## 2020-02-10 DIAGNOSIS — S336XXA Sprain of sacroiliac joint, initial encounter: Secondary | ICD-10-CM | POA: Diagnosis not present

## 2020-02-10 DIAGNOSIS — M4726 Other spondylosis with radiculopathy, lumbar region: Secondary | ICD-10-CM | POA: Diagnosis not present

## 2020-02-10 DIAGNOSIS — M9903 Segmental and somatic dysfunction of lumbar region: Secondary | ICD-10-CM | POA: Diagnosis not present

## 2020-02-10 DIAGNOSIS — M9906 Segmental and somatic dysfunction of lower extremity: Secondary | ICD-10-CM | POA: Diagnosis not present

## 2020-02-10 DIAGNOSIS — S83411A Sprain of medial collateral ligament of right knee, initial encounter: Secondary | ICD-10-CM | POA: Diagnosis not present

## 2020-02-10 DIAGNOSIS — M9904 Segmental and somatic dysfunction of sacral region: Secondary | ICD-10-CM | POA: Diagnosis not present

## 2020-02-15 DIAGNOSIS — M9901 Segmental and somatic dysfunction of cervical region: Secondary | ICD-10-CM | POA: Diagnosis not present

## 2020-02-15 DIAGNOSIS — M9906 Segmental and somatic dysfunction of lower extremity: Secondary | ICD-10-CM | POA: Diagnosis not present

## 2020-02-15 DIAGNOSIS — S336XXA Sprain of sacroiliac joint, initial encounter: Secondary | ICD-10-CM | POA: Diagnosis not present

## 2020-02-15 DIAGNOSIS — S83411A Sprain of medial collateral ligament of right knee, initial encounter: Secondary | ICD-10-CM | POA: Diagnosis not present

## 2020-02-15 DIAGNOSIS — M9904 Segmental and somatic dysfunction of sacral region: Secondary | ICD-10-CM | POA: Diagnosis not present

## 2020-02-15 DIAGNOSIS — M9903 Segmental and somatic dysfunction of lumbar region: Secondary | ICD-10-CM | POA: Diagnosis not present

## 2020-02-15 DIAGNOSIS — M4726 Other spondylosis with radiculopathy, lumbar region: Secondary | ICD-10-CM | POA: Diagnosis not present

## 2020-02-15 DIAGNOSIS — M4723 Other spondylosis with radiculopathy, cervicothoracic region: Secondary | ICD-10-CM | POA: Diagnosis not present

## 2020-02-17 DIAGNOSIS — M9901 Segmental and somatic dysfunction of cervical region: Secondary | ICD-10-CM | POA: Diagnosis not present

## 2020-02-17 DIAGNOSIS — M9904 Segmental and somatic dysfunction of sacral region: Secondary | ICD-10-CM | POA: Diagnosis not present

## 2020-02-17 DIAGNOSIS — S83411A Sprain of medial collateral ligament of right knee, initial encounter: Secondary | ICD-10-CM | POA: Diagnosis not present

## 2020-02-17 DIAGNOSIS — M9903 Segmental and somatic dysfunction of lumbar region: Secondary | ICD-10-CM | POA: Diagnosis not present

## 2020-02-17 DIAGNOSIS — M4726 Other spondylosis with radiculopathy, lumbar region: Secondary | ICD-10-CM | POA: Diagnosis not present

## 2020-02-17 DIAGNOSIS — M4723 Other spondylosis with radiculopathy, cervicothoracic region: Secondary | ICD-10-CM | POA: Diagnosis not present

## 2020-02-17 DIAGNOSIS — S336XXA Sprain of sacroiliac joint, initial encounter: Secondary | ICD-10-CM | POA: Diagnosis not present

## 2020-02-17 DIAGNOSIS — M9906 Segmental and somatic dysfunction of lower extremity: Secondary | ICD-10-CM | POA: Diagnosis not present

## 2020-03-01 ENCOUNTER — Encounter: Payer: Self-pay | Admitting: Osteopathic Medicine

## 2020-03-01 ENCOUNTER — Ambulatory Visit (INDEPENDENT_AMBULATORY_CARE_PROVIDER_SITE_OTHER): Payer: Medicare Other | Admitting: Osteopathic Medicine

## 2020-03-01 VITALS — BP 150/81 | HR 69 | Temp 97.6°F | Wt 144.1 lb

## 2020-03-01 DIAGNOSIS — N952 Postmenopausal atrophic vaginitis: Secondary | ICD-10-CM

## 2020-03-01 DIAGNOSIS — E039 Hypothyroidism, unspecified: Secondary | ICD-10-CM

## 2020-03-01 DIAGNOSIS — Z1231 Encounter for screening mammogram for malignant neoplasm of breast: Secondary | ICD-10-CM

## 2020-03-01 DIAGNOSIS — I10 Essential (primary) hypertension: Secondary | ICD-10-CM

## 2020-03-01 DIAGNOSIS — M810 Age-related osteoporosis without current pathological fracture: Secondary | ICD-10-CM

## 2020-03-01 DIAGNOSIS — M19041 Primary osteoarthritis, right hand: Secondary | ICD-10-CM

## 2020-03-01 DIAGNOSIS — M19042 Primary osteoarthritis, left hand: Secondary | ICD-10-CM

## 2020-03-01 DIAGNOSIS — E559 Vitamin D deficiency, unspecified: Secondary | ICD-10-CM | POA: Diagnosis not present

## 2020-03-01 DIAGNOSIS — Z0000 Encounter for general adult medical examination without abnormal findings: Secondary | ICD-10-CM

## 2020-03-01 DIAGNOSIS — I208 Other forms of angina pectoris: Secondary | ICD-10-CM

## 2020-03-01 MED ORDER — NITROGLYCERIN 0.4 MG SL SUBL: 0.4000 mg | SUBLINGUAL_TABLET | SUBLINGUAL | 1 refills | Status: DC | PRN

## 2020-03-01 MED ORDER — CELECOXIB 200 MG PO CAPS: ORAL_CAPSULE | ORAL | 3 refills | Status: DC

## 2020-03-01 NOTE — Progress Notes (Signed)
HPI: Cindy [name] is a 74 y.o. female  who presents to [facility] today, 03/01/20,  for Medicare Annual Wellness Exam  Patient presents for annual physical/Medicare wellness exam. No other complaints today.   Past medical, surgical, social and family history reviewed:  Patient Active Problem List   Diagnosis Date Noted  . 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/23/2019  . Primary osteoarthritis of both hands 06/23/2019  . Trigger thumb of left hand 06/23/2019  . Polyarthralgia 06/23/2019  . Hypertension 10/30/2018  . Atypical chest pain 10/30/2018  . Migraine 10/30/2018  . Hypothyroidism 10/30/2018  . Osteoporosis 10/30/2018  . GERD (gastroesophageal reflux disease) 10/30/2018  . Anxiety in acute stress reaction 10/30/2018  . Allergic rhinitis 10/30/2018  . Vitamin D deficiency 10/30/2018  . 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    Past Surgical History:  Procedure Laterality Date  . HYSTERECTOMY ABDOMINAL WITH SALPINGECTOMY      Social History   Socioeconomic History  . Marital status: Married    Spouse name: Not on file  . Number of children: Not on file  . Years of education: Not on file  . Highest education level: Not on file  Occupational History  . Not on file  Tobacco Use  . Smoking status: Never Smoker  . Smokeless tobacco: Never Used  Vaping Use  . Vaping Use: Never used  Substance and Sexual Activity  . Alcohol use: Never  . Drug use: Never  . Sexual activity: Yes    Partners: Male  Other Topics Concern  . Not on file  Social History Narrative  . Not on file   Social Determinants of Health   Financial Resource Strain:   . Difficulty of Paying Living Expenses:   Food Insecurity:   . Worried About Charity fundraiser in the Last Year:   . Arboriculturist in the Last Year:   Transportation Needs:   . Film/video editor (Medical):   Marland Kitchen Lack of Transportation (Non-Medical):   Physical  Activity:   . Days of Exercise per Week:   . Minutes of Exercise per Session:   Stress:   . Feeling of Stress :   Social Connections:   . Frequency of Communication with Friends and Family:   . Frequency of Social Gatherings with Friends and Family:   . Attends Religious Services:   . Active Member of Clubs or Organizations:   . Attends Archivist Meetings:   Marland Kitchen Marital Status:   Intimate Partner Violence:   . Fear of Current or Ex-Partner:   . Emotionally Abused:   Marland Kitchen Physically Abused:   . Sexually Abused:     Family History  Problem Relation Age of Onset  . High blood pressure Mother   . Breast cancer Mother   . Heart attack Father   . Prostate cancer Brother   . Leukemia Maternal Grandfather   . Breast cancer Maternal Aunt   . Breast cancer Maternal Aunt      Current medication list and allergy/intolerance information reviewed:    Outpatient Encounter Medications as of 03/01/2020  Medication Sig Note  . celecoxib (CELEBREX) 200 MG capsule One to 2 tablets by mouth daily as needed for pain. Must keep upcoming appt   . cetirizine (ZYRTEC) 10 MG tablet Take 1 tablet (10 mg total) by mouth at bedtime.   Marland Kitchen denosumab (PROLIA) 60 MG/ML SOSY injection Inject into the skin every 6 (six) months.   . diclofenac sodium (VOLTAREN)  1 % GEL Apply 4 g topically 4 (four) times daily.   Marland Kitchen esomeprazole (NEXIUM) 40 MG capsule Take 1 capsule (40 mg total) by mouth daily.   . Estradiol (IMVEXXY MAINTENANCE PACK) 10 MCG INST Place 10 mcg vaginally 2 (two) times a week.   . hydrochlorothiazide (HYDRODIURIL) 25 MG tablet Take 1 tablet (25 mg total) by mouth daily. As need for swelling   . levothyroxine (SYNTHROID) 75 MCG tablet Take 1 tablet (75 mcg total) by mouth daily before breakfast. Take on Days 1-6 and skip Day 7- NEED ANNUAL EXAM AND LABS   . montelukast (SINGULAIR) 10 MG tablet Take 1 tablet (10 mg total) by mouth at bedtime.   Marland Kitchen NIFEdipine (PROCARDIA-XL/NIFEDICAL-XL) 30 MG 24  hr tablet Take 1 tablet by mouth once daily   . nitroGLYCERIN (NITROSTAT) 0.4 MG SL tablet Place 1 tablet (0.4 mg total) under the tongue every 5 (five) minutes as needed for chest pain. 12/17/2018: PRN  . SUMAtriptan (IMITREX) 100 MG tablet Take 0.5-1 tablets (50-100 mg total) by mouth every 2 (two) hours as needed for migraine (Max 2 pills per 24 hours).   . Vitamin D, Ergocalciferol, (DRISDOL) 1.25 MG (50000 UT) CAPS capsule    . [DISCONTINUED] ciprofloxacin (CIPRO) 250 MG tablet Take 1 tablet (250 mg total) by mouth 2 (two) times daily. (Patient not taking: Reported on 03/01/2020)    No facility-administered encounter medications on file as of 03/01/2020.    Allergies  Allergen Reactions  . Sulfamethoxazole-Trimethoprim Rash  . Tetracyclines & Related Rash       Review of Systems: Review of Systems - negative   Medicare Wellness Questionnaire  Are there smokers in your home (other than you)? no  Depression Screen (Note: if answer to either of the following is "Yes", a more complete depression screening is indicated)   Q1: Over the past two weeks, have you felt down, depressed or hopeless? no  Q2: Over the past two weeks, have you felt little interest or pleasure in doing things? no  Have you lost interest or pleasure in daily life? no  Do you often feel hopeless? no  Do you cry easily over simple problems? no  Activities of Daily Living In your present state of health, do you have any difficulty performing the following activities?:  Driving? no Managing money?  no Feeding yourself? no Getting from bed to chair? no Climbing a flight of stairs? no Preparing food and eating?: no Bathing or showering? no Getting dressed: no Getting to the toilet? no Using the toilet: no Moving around from place to place: no In the past year have you fallen or had a near fall?: no  Hearing Difficulties:  Do you often ask people to speak up or repeat themselves? yes Do you experience  ringing or noises in your ears? yes  Do you have difficulty understanding soft or whispered voices? yes  Memory Difficulties:  Do you feel that you have a problem with memory? no  Do you often misplace items? yes  Do you feel safe at home?  yes  Sexual Health:   Are you sexually active?  Yes  Do you have more than one partner?  No  Advanced Directives:   Advanced directives discussed: has NO advanced directive  - add't info requested. Referral to SW: no  Additional information provided: yes  Risk Factors  Current exercise habits: active in garden  Dietary issues discussed:no concerns  Cardiac risk factors: chest pain    Exam:  BP (!) 150/81 (BP Location: Left Arm, Patient Position: Sitting, Cuff Size: Normal)   Pulse 69   Temp 97.6 F (36.4 C) (Oral)   Wt 144 lb 1.9 oz (65.4 kg)   BMI 24.74 kg/m  Vision by Snellen chart: right eye:see nurse notes, left eye:see nurse notes  Constitutional: VS see above. General Appearance: alert, well-developed, well-nourished, NAD  Ears, Nose, Mouth, Throat: MMM  Neck: No masses, trachea midline.   Respiratory: Normal respiratory effort. no wheeze, no rhonchi, no rales  Cardiovascular:No lower extremity edema.   Musculoskeletal: Gait normal. No clubbing/cyanosis of digits.   Neurological: Normal balance/coordination. No tremor. Recalls 3 objects and able to read face of watch with correct time.   Skin: warm, dry, intact. No rash/ulcer.   Psychiatric: Normal judgment/insight. Normal mood and affect. Oriented x3.     ASSESSMENT/PLAN:   Encounter for Medicare annual wellness exam  Medicare annual wellness visit, subsequent - Plan: CBC, COMPLETE METABOLIC PANEL WITH GFR, Lipid panel, TSH, VITAMIN D 25 Hydroxy (Vit-D Deficiency, Fractures), MM 3D SCREEN BREAST BILATERAL, DG Bone Density  Essential hypertension - Plan: CBC, COMPLETE METABOLIC PANEL WITH GFR, Lipid panel, TSH, VITAMIN D 25 Hydroxy (Vit-D Deficiency,  Fractures)  Vitamin D deficiency - Plan: CBC, COMPLETE METABOLIC PANEL WITH GFR, Lipid panel, TSH, VITAMIN D 25 Hydroxy (Vit-D Deficiency, Fractures)  Hypothyroidism, unspecified type - Plan: CBC, COMPLETE METABOLIC PANEL WITH GFR, Lipid panel, TSH, VITAMIN D 25 Hydroxy (Vit-D Deficiency, Fractures)  Postmenopausal atrophic vaginitis - Plan: CBC, COMPLETE METABOLIC PANEL WITH GFR, Lipid panel, TSH, VITAMIN D 25 Hydroxy (Vit-D Deficiency, Fractures)  Age-related osteoporosis without current pathological fracture - Plan: CBC, COMPLETE METABOLIC PANEL WITH GFR, Lipid panel, TSH, VITAMIN D 25 Hydroxy (Vit-D Deficiency, Fractures), DG Bone Density  Encounter for screening mammogram for malignant neoplasm of breast - Plan: MM 3D SCREEN BREAST BILATERAL  General Preventive Care  Most recent routine screening labs: ordered today.   Blood pressure goal 140/90 or less.   Tobacco: don't!   Alcohol: responsible moderation is ok for most adults - if you have concerns about your alcohol intake, please talk to me!   Exercise: as tolerated to reduce risk of cardiovascular disease and diabetes. Strength training will also prevent osteoporosis.   Mental health: if need for mental health care (medicines, counseling, other), or concerns about moods, please let me know!   Sexual / Reproductive health: if need for STD testing, or if concerns with libido/pain problems, please let me know!  Advanced Directive: Living Will and/or Healthcare Power of Attorney recommended for all adults, regardless of age or health.  Vaccines  Flu vaccine: for almost everyone, every fall.   Shingles vaccine: after age 62.   Pneumonia vaccines: after age 39  Tetanus booster: every 10 years   COVID vaccine: STRONGLY RECOMMENDED  Cancer screenings   Colon cancer screening: for everyone age 54-75.   Breast cancer screening: mammogram optional every year or every other year after age 95   Cervical cancer screening: Can  stop Pap at age 66 or w/ hysterectomy.   Lung cancer screening: not needed for non-smokers  Infection screenings  . HIV: recommended screening at least once age 43-65 . Gonorrhea/Chlamydia: screening as needed . Hepatitis C: recommended once for everyone age 69-75 . TB: certain at-risk populations, or depending on work requirements and/or travel history Other . Bone Density Test: due! Last done 02/2018, plan to repeat every 2 years       During the course of the visit  the patient was educated and counseled about appropriate screening and preventive services as noted above.   Patient Instructions (the written plan) was given to the patient.  Medicare Attestation I have personally reviewed: The patient's medical and social history Their use of alcohol, tobacco or illicit drugs Their current medications and supplements The patient's functional ability including ADLs,fall risks, home safety risks, cognitive, and hearing and visual impairment Diet and physical activities Evidence for depression or mood disorders  The patient's weight, height, BMI, and visual acuity have been recorded in the chart.  I have made referrals, counseling, and provided education to the patient based on review of the above and I have provided the patient with a written personalized care plan for preventive services.     Emeterio Reeve, DO   03/01/20   Visit summary with medication list and pertinent instructions was printed for patient to review. All questions at time of visit were answered - patient instructed to contact office with any additional concerns. ER/RTC precautions were reviewed with the patient. Follow-up plan: Return in about 6 months (around 08/31/2020) for [place] BP CHECK, LABS PRIOR TO VISIT.

## 2020-03-01 NOTE — Patient Instructions (Signed)
General Preventive Care  Most recent routine screening labs: ordered today.   Blood pressure goal 140/90 or less.   Tobacco: don't!   Alcohol: responsible moderation is ok for most adults - if you have concerns about your alcohol intake, please talk to me!   Exercise: as tolerated to reduce risk of cardiovascular disease and diabetes. Strength training will also prevent osteoporosis.   Mental health: if need for mental health care (medicines, counseling, other), or concerns about moods, please let me know!   Sexual / Reproductive health: if need for STD testing, or if concerns with libido/pain problems, please let me know!  Advanced Directive: Living Will and/or Healthcare Power of Attorney recommended for all adults, regardless of age or health.  Vaccines  Flu vaccine: for almost everyone, every fall.   Shingles vaccine: after age 37.   Pneumonia vaccines: after age 47  Tetanus booster: every 10 years   COVID vaccine: STRONGLY RECOMMENDED  Cancer screenings   Colon cancer screening: for everyone age 13-75.   Breast cancer screening: mammogram optional every year or every other year after age 51   Cervical cancer screening: Can stop Pap at age 33 or w/ hysterectomy.   Lung cancer screening: not needed for non-smokers  Infection screenings  . HIV: recommended screening at least once age 69-65 . Gonorrhea/Chlamydia: screening as needed . Hepatitis C: recommended once for everyone age 13-75 . TB: certain at-risk populations, or depending on work requirements and/or travel history Other . Bone Density Test: due! Last done 02/2018, plan to repeat every 2 years

## 2020-03-02 ENCOUNTER — Telehealth: Payer: Self-pay

## 2020-03-02 ENCOUNTER — Encounter: Payer: Self-pay | Admitting: Osteopathic Medicine

## 2020-03-02 DIAGNOSIS — N952 Postmenopausal atrophic vaginitis: Secondary | ICD-10-CM

## 2020-03-02 DIAGNOSIS — E039 Hypothyroidism, unspecified: Secondary | ICD-10-CM

## 2020-03-02 DIAGNOSIS — E7800 Pure hypercholesterolemia, unspecified: Secondary | ICD-10-CM

## 2020-03-02 DIAGNOSIS — E559 Vitamin D deficiency, unspecified: Secondary | ICD-10-CM

## 2020-03-02 DIAGNOSIS — Z0000 Encounter for general adult medical examination without abnormal findings: Secondary | ICD-10-CM

## 2020-03-02 NOTE — Telephone Encounter (Signed)
Patient called into office concerned about insurance covering labs that were ordered on yesterday. Labs were reordered with an additional code that should labs that were ordered. Patient was called and notified of the change and will precede to have labs done.

## 2020-03-03 LAB — COMPLETE METABOLIC PANEL WITH GFR
AG Ratio: 1.9 (calc) (ref 1.0–2.5)
ALT: 21 U/L (ref 6–29)
AST: 26 U/L (ref 10–35)
Albumin: 4.4 g/dL (ref 3.6–5.1)
Alkaline phosphatase (APISO): 58 U/L (ref 37–153)
BUN: 21 mg/dL (ref 7–25)
CO2: 31 mmol/L (ref 20–32)
Calcium: 9.4 mg/dL (ref 8.6–10.4)
Chloride: 104 mmol/L (ref 98–110)
Creat: 0.92 mg/dL (ref 0.60–0.93)
GFR, Est African American: 70 mL/min/{1.73_m2} (ref >=60)
GFR, Est Non African American: 60 mL/min/{1.73_m2} (ref >=60)
Globulin: 2.3 g/dL (calc) (ref 1.9–3.7)
Glucose, Bld: 94 mg/dL (ref 65–99)
Potassium: 5.2 mmol/L (ref 3.5–5.3)
Sodium: 141 mmol/L (ref 135–146)
Total Bilirubin: 0.7 mg/dL (ref 0.2–1.2)
Total Protein: 6.7 g/dL (ref 6.1–8.1)

## 2020-03-03 LAB — TSH: TSH: 0.85 mIU/L (ref 0.40–4.50)

## 2020-03-03 LAB — VITAMIN D 25 HYDROXY (VIT D DEFICIENCY, FRACTURES): Vit D, 25-Hydroxy: 71 ng/mL (ref 30–100)

## 2020-03-03 LAB — CBC
HCT: 42 % (ref 35.0–45.0)
Hemoglobin: 14.2 g/dL (ref 11.7–15.5)
MCH: 31.6 pg (ref 27.0–33.0)
MCHC: 33.8 g/dL (ref 32.0–36.0)
MCV: 93.5 fL (ref 80.0–100.0)
MPV: 9.6 fL (ref 7.5–12.5)
Platelets: 217 10*3/uL (ref 140–400)
RBC: 4.49 10*6/uL (ref 3.80–5.10)
RDW: 12.6 % (ref 11.0–15.0)
WBC: 5.1 10*3/uL (ref 3.8–10.8)

## 2020-03-07 ENCOUNTER — Ambulatory Visit: Payer: Medicare Other

## 2020-03-08 ENCOUNTER — Ambulatory Visit (INDEPENDENT_AMBULATORY_CARE_PROVIDER_SITE_OTHER): Payer: Medicare Other | Admitting: Osteopathic Medicine

## 2020-03-08 ENCOUNTER — Encounter: Payer: Self-pay | Admitting: Osteopathic Medicine

## 2020-03-08 ENCOUNTER — Other Ambulatory Visit: Payer: Self-pay

## 2020-03-08 VITALS — BP 133/75 | HR 91 | Temp 98.0°F

## 2020-03-08 DIAGNOSIS — M810 Age-related osteoporosis without current pathological fracture: Secondary | ICD-10-CM | POA: Diagnosis not present

## 2020-03-08 MED ORDER — DENOSUMAB 60 MG/ML ~~LOC~~ SOSY
60.0000 mg | PREFILLED_SYRINGE | Freq: Once | SUBCUTANEOUS | Status: AC
  Administered 2020-03-08: 60 mg via SUBCUTANEOUS

## 2020-03-08 NOTE — Progress Notes (Signed)
Patient presents today as a nurse visit for Prolia 60 mg/1 ml injection. Patient is scheduled to get this injection every 6 months. Patients last injection was given on 09/09/2019 in the right arm. Patients last CMP was done on 03/02/2020.  Patient denies falls, CP, palpitations, ShOB, dizziness/lightheadedness, abdominal pain, headache, and mood swings.   Injection given in left arm. Pt tolerated injection well without complications. Pt instructed to schedule a nurse visit for the next injection that will be due in 6 months with lab work to be done prior.

## 2020-03-09 ENCOUNTER — Ambulatory Visit: Payer: 59

## 2020-03-28 DIAGNOSIS — S83411A Sprain of medial collateral ligament of right knee, initial encounter: Secondary | ICD-10-CM | POA: Diagnosis not present

## 2020-03-28 DIAGNOSIS — S336XXA Sprain of sacroiliac joint, initial encounter: Secondary | ICD-10-CM | POA: Diagnosis not present

## 2020-03-28 DIAGNOSIS — M9904 Segmental and somatic dysfunction of sacral region: Secondary | ICD-10-CM | POA: Diagnosis not present

## 2020-03-28 DIAGNOSIS — M9906 Segmental and somatic dysfunction of lower extremity: Secondary | ICD-10-CM | POA: Diagnosis not present

## 2020-03-28 DIAGNOSIS — M9903 Segmental and somatic dysfunction of lumbar region: Secondary | ICD-10-CM | POA: Diagnosis not present

## 2020-03-28 DIAGNOSIS — M4726 Other spondylosis with radiculopathy, lumbar region: Secondary | ICD-10-CM | POA: Diagnosis not present

## 2020-03-28 DIAGNOSIS — M9901 Segmental and somatic dysfunction of cervical region: Secondary | ICD-10-CM | POA: Diagnosis not present

## 2020-03-28 DIAGNOSIS — M4723 Other spondylosis with radiculopathy, cervicothoracic region: Secondary | ICD-10-CM | POA: Diagnosis not present

## 2020-03-29 DIAGNOSIS — S83411A Sprain of medial collateral ligament of right knee, initial encounter: Secondary | ICD-10-CM | POA: Diagnosis not present

## 2020-03-29 DIAGNOSIS — M9903 Segmental and somatic dysfunction of lumbar region: Secondary | ICD-10-CM | POA: Diagnosis not present

## 2020-03-29 DIAGNOSIS — S336XXA Sprain of sacroiliac joint, initial encounter: Secondary | ICD-10-CM | POA: Diagnosis not present

## 2020-03-29 DIAGNOSIS — M4723 Other spondylosis with radiculopathy, cervicothoracic region: Secondary | ICD-10-CM | POA: Diagnosis not present

## 2020-03-29 DIAGNOSIS — M9906 Segmental and somatic dysfunction of lower extremity: Secondary | ICD-10-CM | POA: Diagnosis not present

## 2020-03-29 DIAGNOSIS — M9904 Segmental and somatic dysfunction of sacral region: Secondary | ICD-10-CM | POA: Diagnosis not present

## 2020-03-29 DIAGNOSIS — M4726 Other spondylosis with radiculopathy, lumbar region: Secondary | ICD-10-CM | POA: Diagnosis not present

## 2020-03-29 DIAGNOSIS — M9901 Segmental and somatic dysfunction of cervical region: Secondary | ICD-10-CM | POA: Diagnosis not present

## 2020-04-02 ENCOUNTER — Other Ambulatory Visit: Payer: Self-pay | Admitting: Osteopathic Medicine

## 2020-04-04 DIAGNOSIS — M4723 Other spondylosis with radiculopathy, cervicothoracic region: Secondary | ICD-10-CM | POA: Diagnosis not present

## 2020-04-04 DIAGNOSIS — M9904 Segmental and somatic dysfunction of sacral region: Secondary | ICD-10-CM | POA: Diagnosis not present

## 2020-04-04 DIAGNOSIS — S83411A Sprain of medial collateral ligament of right knee, initial encounter: Secondary | ICD-10-CM | POA: Diagnosis not present

## 2020-04-04 DIAGNOSIS — S336XXA Sprain of sacroiliac joint, initial encounter: Secondary | ICD-10-CM | POA: Diagnosis not present

## 2020-04-04 DIAGNOSIS — M9903 Segmental and somatic dysfunction of lumbar region: Secondary | ICD-10-CM | POA: Diagnosis not present

## 2020-04-04 DIAGNOSIS — M9901 Segmental and somatic dysfunction of cervical region: Secondary | ICD-10-CM | POA: Diagnosis not present

## 2020-04-04 DIAGNOSIS — M9906 Segmental and somatic dysfunction of lower extremity: Secondary | ICD-10-CM | POA: Diagnosis not present

## 2020-04-04 DIAGNOSIS — M4726 Other spondylosis with radiculopathy, lumbar region: Secondary | ICD-10-CM | POA: Diagnosis not present

## 2020-04-05 DIAGNOSIS — S336XXA Sprain of sacroiliac joint, initial encounter: Secondary | ICD-10-CM | POA: Diagnosis not present

## 2020-04-05 DIAGNOSIS — M9906 Segmental and somatic dysfunction of lower extremity: Secondary | ICD-10-CM | POA: Diagnosis not present

## 2020-04-05 DIAGNOSIS — M4726 Other spondylosis with radiculopathy, lumbar region: Secondary | ICD-10-CM | POA: Diagnosis not present

## 2020-04-05 DIAGNOSIS — M9904 Segmental and somatic dysfunction of sacral region: Secondary | ICD-10-CM | POA: Diagnosis not present

## 2020-04-05 DIAGNOSIS — M9901 Segmental and somatic dysfunction of cervical region: Secondary | ICD-10-CM | POA: Diagnosis not present

## 2020-04-05 DIAGNOSIS — M4723 Other spondylosis with radiculopathy, cervicothoracic region: Secondary | ICD-10-CM | POA: Diagnosis not present

## 2020-04-05 DIAGNOSIS — S83411A Sprain of medial collateral ligament of right knee, initial encounter: Secondary | ICD-10-CM | POA: Diagnosis not present

## 2020-04-05 DIAGNOSIS — M9903 Segmental and somatic dysfunction of lumbar region: Secondary | ICD-10-CM | POA: Diagnosis not present

## 2020-04-12 ENCOUNTER — Ambulatory Visit: Payer: 59

## 2020-04-19 ENCOUNTER — Other Ambulatory Visit: Payer: 59

## 2020-04-19 ENCOUNTER — Ambulatory Visit: Payer: 59

## 2020-05-03 NOTE — Progress Notes (Signed)
Referring-Natalie Alexander, DO Reason for referral-chest pain  HPI: 74 year old female for evaluation of chest pain at request of Emeterio Reeve, DO.  Patient previously resided in [place] and moved here in 2019 to be close to family.  She has had occasional chest pain intermittently for approximately 20 years.  It begins in the right temporal area and radiates to the right shoulder and to the substernal area.  It is described as a squeezing pain.  No associated nausea, dyspnea or diaphoresis.  Typically last 5 to 15 minutes and resolve spontaneously or with sublingual nitroglycerin.  It is not exertional or related to food.  She also has some dyspnea on exertion but no orthopnea, PND, exertional chest pain or syncope.  Occasional minimal pedal edema.  Cardiology now asked to evaluate.  Current Outpatient Medications  Medication Sig Dispense Refill   celecoxib (CELEBREX) 200 MG capsule One to 2 tablets by mouth daily as needed for pain. 180 capsule 3   cetirizine (ZYRTEC) 10 MG tablet Take 1 tablet (10 mg total) by mouth at bedtime. 90 tablet 1   denosumab (PROLIA) 60 MG/ML SOSY injection Inject into the skin every 6 (six) months.     diclofenac sodium (VOLTAREN) 1 % GEL Apply 4 g topically 4 (four) times daily. 100 g 11   esomeprazole (NEXIUM) 40 MG capsule Take 1 capsule (40 mg total) by mouth daily. 90 capsule 2   Estradiol (IMVEXXY MAINTENANCE PACK) 10 MCG INST Place 10 mcg vaginally 2 (two) times a week. 8 each 11   hydrochlorothiazide (HYDRODIURIL) 25 MG tablet Take 1 tablet (25 mg total) by mouth daily. As need for swelling 90 tablet 0   levothyroxine (SYNTHROID) 75 MCG tablet Take 1 tablet (75 mcg total) by mouth daily before breakfast. Take on Days 1-6 and skip Day 7- NEED ANNUAL EXAM AND LABS 90 tablet 2   montelukast (SINGULAIR) 10 MG tablet Take 1 tablet (10 mg total) by mouth at bedtime. 90 tablet 1   NIFEdipine (PROCARDIA-XL/NIFEDICAL-XL) 30 MG 24 hr tablet Take  1 tablet by mouth once daily 90 tablet 2   nitroGLYCERIN (NITROSTAT) 0.4 MG SL tablet Place 1 tablet (0.4 mg total) under the tongue every 5 (five) minutes as needed for chest pain. 20 tablet 1   SUMAtriptan (IMITREX) 100 MG tablet Take 0.5-1 tablets (50-100 mg total) by mouth every 2 (two) hours as needed for migraine (Max 2 pills per 24 hours). 10 tablet 3   Vitamin D, Ergocalciferol, (DRISDOL) 1.25 MG (50000 UT) CAPS capsule      No current facility-administered medications for this visit.    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   Vitamin D deficiency 10/30/2018    Past Surgical History:  Procedure Laterality Date   HYSTERECTOMY ABDOMINAL WITH SALPINGECTOMY     TONSILLECTOMY      Social History   Socioeconomic History   Marital status: Married    Spouse name: Not on file   Number of children: 2   Years of education: Not on file  Highest education level: Not on file  Occupational History   Not on file  Tobacco Use   Smoking status: Never Smoker   Smokeless tobacco: Never Used  Vaping Use   Vaping Use: Never used  Substance and Sexual Activity   Alcohol use: Never   Drug use: Never   Sexual activity: Yes    Partners: Male  Other Topics Concern   Not on file  Social History Narrative   Not on file   Social Determinants of Health   Financial Resource Strain:    Difficulty of Paying Living Expenses: Not on file  Food Insecurity:    Worried About [place] in the Last Year: Not on file   Ran Out of Food in the Last Year: Not on file  Transportation Needs:    Lack of Transportation (Medical): Not on file   Lack of Transportation (Non-Medical): Not on file  Physical Activity:    Days of Exercise per Week: Not on file   Minutes of Exercise per Session: Not on file  Stress:    Feeling of Stress : Not on file  Social Connections:    Frequency of Communication with Friends and Family: Not on file   Frequency of Social Gatherings with Friends and Family: Not on file   Attends Religious Services: Not on file   Active Member of Clubs or Organizations: Not on file   Attends Archivist Meetings: Not on file   Marital Status: Not on file  Intimate Partner Violence:    Fear of Current or Ex-Partner: Not on file   Emotionally Abused: Not on file   Physically Abused: Not on file   Sexually Abused: Not on file    Family History  Problem Relation Age of Onset   High blood pressure Mother    Breast cancer Mother    Alzheimer's disease Mother    Heart attack Father    Prostate cancer Brother    Leukemia Maternal Grandfather    Breast cancer Maternal Aunt    Breast cancer Maternal Aunt     ROS: no fevers or chills, productive cough, hemoptysis, dysphasia, odynophagia, melena, hematochezia, dysuria, hematuria, rash, seizure activity, orthopnea, PND, claudication. Remaining systems are negative.  Physical Exam:   Blood pressure 122/79, pulse 90, height 5\' 4"  (1.626 m), weight 143 lb 12.8 oz (65.2 kg).  General:  Well developed/well nourished in NAD Skin warm/dry Patient not depressed No peripheral clubbing Back-normal HEENT-normal/normal eyelids Neck supple/normal carotid upstroke bilaterally; no bruits; no JVD; no thyromegaly chest - CTA/ normal expansion CV - RRR/normal S1 and S2; no murmurs, rubs or gallops;  PMI nondisplaced Abdomen -NT/ND, no HSM, no mass, + bowel sounds, no bruit 2+ femoral  pulses, no bruits Ext-no edema, chords, 2+ DP Neuro-grossly nonfocal  ECG -sinus rhythm at a rate of 90, normal axis, short PR interval, nonspecific ST changes.  Personally reviewed  A/P  1 chest pain-symptoms are atypical.  Electrocardiogram shows no ST changes.  We will obtain all previous records from [place].  I will arrange a cardiac CTA to rule out coronary disease.  We will continue with sublingual nitroglycerin as needed.  2 hypertension-patient's blood pressure is controlled.  Continue present medications.  Kirk Ruths, MD

## 2020-05-04 ENCOUNTER — Other Ambulatory Visit: Payer: Self-pay | Admitting: Nurse Practitioner

## 2020-05-04 DIAGNOSIS — E039 Hypothyroidism, unspecified: Secondary | ICD-10-CM

## 2020-05-08 ENCOUNTER — Other Ambulatory Visit: Payer: Self-pay | Admitting: Osteopathic Medicine

## 2020-05-08 DIAGNOSIS — E039 Hypothyroidism, unspecified: Secondary | ICD-10-CM

## 2020-05-08 MED ORDER — ESOMEPRAZOLE MAGNESIUM 40 MG PO CPDR: 40.0000 mg | DELAYED_RELEASE_CAPSULE | Freq: Every day | ORAL | 2 refills | Status: DC

## 2020-05-08 MED ORDER — LEVOTHYROXINE SODIUM 75 MCG PO TABS: 75.0000 ug | ORAL_TABLET | Freq: Every day | ORAL | 2 refills | Status: DC

## 2020-05-10 ENCOUNTER — Ambulatory Visit (INDEPENDENT_AMBULATORY_CARE_PROVIDER_SITE_OTHER): Payer: Medicare Other

## 2020-05-10 ENCOUNTER — Encounter: Payer: Self-pay | Admitting: Cardiology

## 2020-05-10 ENCOUNTER — Ambulatory Visit (INDEPENDENT_AMBULATORY_CARE_PROVIDER_SITE_OTHER): Payer: Medicare Other | Admitting: Cardiology

## 2020-05-10 ENCOUNTER — Other Ambulatory Visit: Payer: Self-pay

## 2020-05-10 ENCOUNTER — Encounter: Payer: Self-pay | Admitting: Osteopathic Medicine

## 2020-05-10 VITALS — BP 122/79 | HR 90 | Ht 64.0 in | Wt 143.8 lb

## 2020-05-10 DIAGNOSIS — Z0000 Encounter for general adult medical examination without abnormal findings: Secondary | ICD-10-CM | POA: Diagnosis not present

## 2020-05-10 DIAGNOSIS — M810 Age-related osteoporosis without current pathological fracture: Secondary | ICD-10-CM | POA: Diagnosis not present

## 2020-05-10 DIAGNOSIS — Z1231 Encounter for screening mammogram for malignant neoplasm of breast: Secondary | ICD-10-CM

## 2020-05-10 DIAGNOSIS — I10 Essential (primary) hypertension: Secondary | ICD-10-CM

## 2020-05-10 DIAGNOSIS — R072 Precordial pain: Secondary | ICD-10-CM | POA: Diagnosis not present

## 2020-05-10 DIAGNOSIS — Z780 Asymptomatic menopausal state: Secondary | ICD-10-CM | POA: Diagnosis not present

## 2020-05-10 DIAGNOSIS — I2510 Atherosclerotic heart disease of native coronary artery without angina pectoris: Secondary | ICD-10-CM | POA: Diagnosis not present

## 2020-05-10 MED ORDER — METOPROLOL TARTRATE 100 MG PO TABS: ORAL_TABLET | ORAL | 0 refills | Status: DC

## 2020-05-10 NOTE — Patient Instructions (Signed)
Medication Instructions:  NO CHANGE *If you need a refill on your cardiac medications before your next appointment, please call your pharmacy*   Lab Work: If you have labs (blood work) drawn today and your tests are completely normal, you will receive your results only by: Marland Kitchen MyChart Message (if you have MyChart) OR . A paper copy in the mail If you have any lab test that is abnormal or we need to change your treatment, we will call you to review the results.   Testing/Procedures:  Your cardiac CT will be scheduled at one of the below locations:   [facility] [address] [place], [place] [address] [phone]  If scheduled at [facility], please arrive at the [facility] main entrance of [facility] 30 minutes prior to test start time. Proceed to the [facility] Radiology Department (first floor) to check-in and test prep.  Please follow these instructions carefully (unless otherwise directed):  On the Night Before the Test: . Be sure to Drink plenty of water. . Do not consume any caffeinated/decaffeinated beverages or chocolate 12 hours prior to your test. . Do not take any antihistamines 12 hours prior to your test.  On the Day of the Test: . Drink plenty of water. Do not drink any water within one hour of the test. . Do not eat any food 4 hours prior to the test. . You may take your regular medications prior to the test.  . Take metoprolol (Lopressor) 100 MG two hours prior to test. . HOLD Furosemide/Hydrochlorothiazide morning of the test. . FEMALES- please wear underwire-free bra if available       After the Test: . Drink plenty of water. . After receiving IV contrast, you may experience a mild flushed feeling. This is normal. . On occasion, you may experience a mild rash up to 24 hours after the test. This is not dangerous. If this occurs, you can take Benadryl 25 mg and increase your fluid intake. . If you experience trouble  breathing, this can be serious. If it is severe call 911 IMMEDIATELY. If it is mild, please call our office. . If you take any of these medications: Glipizide/Metformin, Avandament, Glucavance, please do not take 48 hours after completing test unless otherwise instructed.   Once we have confirmed authorization from your insurance company, we will call you to set up a date and time for your test. Based on how quickly your insurance processes prior authorizations requests, please allow up to 4 weeks to be contacted for scheduling your Cardiac CT appointment. Be advised that routine Cardiac CT appointments could be scheduled as many as 8 weeks after your provider has ordered it.  For non-scheduling related questions, please contact the cardiac imaging nurse navigator should you have any questions/concerns: Marchia Bond, Cardiac Imaging Nurse Navigator Burley Saver, Interim Cardiac Imaging Nurse [facility] Direct Office Dial: [phone]   For scheduling needs, including cancellations and rescheduling, please call Vivien Rota at [phone], option 3.      Follow-Up: At [facility], you and your health needs are our priority.  As part of our continuing mission to provide you with exceptional heart care, we have created designated Provider Care Teams.  These Care Teams include your primary Cardiologist (physician) and Advanced Practice Providers (APPs -  Physician Assistants and Nurse Practitioners) who all work together to provide you with the care you need, when you need it.  We recommend signing up for  the patient portal called "MyChart".  Sign up information is provided on this After Visit Summary.  MyChart is used to connect with patients for Virtual Visits (Telemedicine).  Patients are able to view lab/test results, encounter notes, upcoming appointments, etc.  Non-urgent messages can be sent to your provider as well.   To learn more about what you can do with  MyChart, go to [email or web address].    Your next appointment:   12 month(s)  The format for your next appointment:   In Person  Provider:   Kirk Ruths, MD

## 2020-05-12 ENCOUNTER — Telehealth: Payer: Self-pay | Admitting: Cardiology

## 2020-05-12 NOTE — Telephone Encounter (Signed)
Faxed  Signed release  To [phone] Dr. Donia Ast  Office 05/12/20  fsw

## 2020-05-17 ENCOUNTER — Telehealth: Payer: Self-pay

## 2020-05-17 ENCOUNTER — Encounter: Payer: Self-pay | Admitting: Osteopathic Medicine

## 2020-05-17 DIAGNOSIS — Z01812 Encounter for preprocedural laboratory examination: Secondary | ICD-10-CM

## 2020-05-17 NOTE — Telephone Encounter (Signed)
He can absolutely order labs We can put in BMP and ask lab to cc to [facility]

## 2020-05-17 NOTE — Telephone Encounter (Signed)
Dot called and states she needs lab orders for kidney function. She is under the impression that only Dr Sheppard Coil can order the labs for the CT Dr Stanford Breed has ordered. Please advise.

## 2020-05-18 DIAGNOSIS — Z01812 Encounter for preprocedural laboratory examination: Secondary | ICD-10-CM | POA: Diagnosis not present

## 2020-05-18 NOTE — Telephone Encounter (Signed)
Patient advised. Dr Stanford Breed ordered a BMP.

## 2020-05-19 LAB — BASIC METABOLIC PANEL
BUN: 23 mg/dL (ref 7–25)
CO2: 32 mmol/L (ref 20–32)
Calcium: 8.9 mg/dL (ref 8.6–10.4)
Chloride: 105 mmol/L (ref 98–110)
Creat: 0.9 mg/dL (ref 0.60–0.93)
Glucose, Bld: 85 mg/dL (ref 65–99)
Potassium: 4.7 mmol/L (ref 3.5–5.3)
Sodium: 142 mmol/L (ref 135–146)

## 2020-05-23 ENCOUNTER — Telehealth (HOSPITAL_COMMUNITY): Payer: Self-pay | Admitting: Emergency Medicine

## 2020-05-23 ENCOUNTER — Other Ambulatory Visit: Payer: Self-pay | Admitting: Osteopathic Medicine

## 2020-05-23 DIAGNOSIS — R928 Other abnormal and inconclusive findings on diagnostic imaging of breast: Secondary | ICD-10-CM

## 2020-05-23 NOTE — Telephone Encounter (Signed)
Reaching out to patient to offer assistance regarding upcoming cardiac imaging study; pt verbalizes understanding of appt date/time, parking situation and where to check in, pre-test NPO status and medications ordered, and verified current allergies; name and call back number provided for further questions should they arise Sadeen Wiegel RN Navigator Cardiac Imaging [facility] [phone] office [phone] cell 

## 2020-05-24 ENCOUNTER — Other Ambulatory Visit: Payer: Self-pay

## 2020-05-24 ENCOUNTER — Telehealth (HOSPITAL_COMMUNITY): Payer: Self-pay | Admitting: Emergency Medicine

## 2020-05-24 ENCOUNTER — Ambulatory Visit (HOSPITAL_COMMUNITY)
Admission: RE | Admit: 2020-05-24 | Discharge: 2020-05-24 | Disposition: A | Discharge status: Home / Self Care | Payer: Medicare Other | Source: Ambulatory Visit | Attending: Cardiology | Admitting: Cardiology

## 2020-05-24 DIAGNOSIS — I2510 Atherosclerotic heart disease of native coronary artery without angina pectoris: Secondary | ICD-10-CM

## 2020-05-24 DIAGNOSIS — I10 Essential (primary) hypertension: Secondary | ICD-10-CM | POA: Diagnosis not present

## 2020-05-24 DIAGNOSIS — R072 Precordial pain: Secondary | ICD-10-CM | POA: Diagnosis not present

## 2020-05-24 MED ORDER — IOHEXOL 350 MG/ML SOLN
80.0000 mL | Freq: Once | INTRAVENOUS | Status: AC | PRN
  Administered 2020-05-24: 80 mL via INTRAVENOUS

## 2020-05-24 MED ORDER — NITROGLYCERIN 0.4 MG SL SUBL
SUBLINGUAL_TABLET | SUBLINGUAL | Status: AC
  Filled 2020-05-24: qty 2

## 2020-05-24 MED ORDER — NITROGLYCERIN 0.4 MG SL SUBL
0.8000 mg | SUBLINGUAL_TABLET | Freq: Once | SUBLINGUAL | Status: AC
  Administered 2020-05-24: 0.8 mg via SUBLINGUAL

## 2020-05-24 NOTE — Telephone Encounter (Signed)
error 

## 2020-05-30 ENCOUNTER — Telehealth: Payer: Self-pay | Admitting: Cardiology

## 2020-05-30 NOTE — Telephone Encounter (Signed)
Received records from Dr. Donia Ast  , will be prepped and sent to scan center.   05/30/20  fsw

## 2020-05-31 MED ORDER — ROSUVASTATIN CALCIUM 20 MG PO TABS: 20.0000 mg | ORAL_TABLET | Freq: Every day | ORAL | 3 refills | Status: DC

## 2020-06-05 ENCOUNTER — Ambulatory Visit
Admission: RE | Admit: 2020-06-05 | Discharge: 2020-06-05 | Disposition: A | Discharge status: Home / Self Care | Payer: Medicare Other | Source: Ambulatory Visit | Attending: Osteopathic Medicine | Admitting: Osteopathic Medicine

## 2020-06-05 ENCOUNTER — Other Ambulatory Visit: Payer: Self-pay | Admitting: Osteopathic Medicine

## 2020-06-05 ENCOUNTER — Other Ambulatory Visit: Payer: Self-pay

## 2020-06-05 DIAGNOSIS — R928 Other abnormal and inconclusive findings on diagnostic imaging of breast: Secondary | ICD-10-CM | POA: Diagnosis not present

## 2020-06-05 DIAGNOSIS — N6320 Unspecified lump in the left breast, unspecified quadrant: Secondary | ICD-10-CM

## 2020-06-05 DIAGNOSIS — N6489 Other specified disorders of breast: Secondary | ICD-10-CM | POA: Diagnosis not present

## 2020-06-24 DIAGNOSIS — Z20822 Contact with and (suspected) exposure to covid-19: Secondary | ICD-10-CM | POA: Diagnosis not present

## 2020-06-26 ENCOUNTER — Ambulatory Visit
Admission: RE | Admit: 2020-06-26 | Discharge: 2020-06-26 | Disposition: A | Discharge status: Home / Self Care | Payer: Medicare Other | Source: Ambulatory Visit | Attending: Osteopathic Medicine | Admitting: Osteopathic Medicine

## 2020-06-26 ENCOUNTER — Other Ambulatory Visit: Payer: Self-pay

## 2020-06-26 DIAGNOSIS — N6320 Unspecified lump in the left breast, unspecified quadrant: Secondary | ICD-10-CM

## 2020-06-26 DIAGNOSIS — C50412 Malignant neoplasm of upper-outer quadrant of left female breast: Secondary | ICD-10-CM | POA: Diagnosis not present

## 2020-06-26 DIAGNOSIS — N6321 Unspecified lump in the left breast, upper outer quadrant: Secondary | ICD-10-CM | POA: Diagnosis not present

## 2020-06-26 DIAGNOSIS — R928 Other abnormal and inconclusive findings on diagnostic imaging of breast: Secondary | ICD-10-CM | POA: Diagnosis not present

## 2020-06-26 HISTORY — PX: BREAST BIOPSY: SHX20

## 2020-06-29 ENCOUNTER — Encounter: Payer: Self-pay | Admitting: *Deleted

## 2020-06-30 ENCOUNTER — Encounter: Payer: Self-pay | Admitting: *Deleted

## 2020-06-30 ENCOUNTER — Other Ambulatory Visit: Payer: Self-pay | Admitting: *Deleted

## 2020-06-30 ENCOUNTER — Telehealth: Payer: Self-pay | Admitting: Hematology and Oncology

## 2020-06-30 DIAGNOSIS — Z171 Estrogen receptor negative status [ER-]: Secondary | ICD-10-CM | POA: Insufficient documentation

## 2020-06-30 DIAGNOSIS — C50412 Malignant neoplasm of upper-outer quadrant of left female breast: Secondary | ICD-10-CM

## 2020-06-30 NOTE — Telephone Encounter (Signed)
Spoke to patient to confirm morning [facility] appointment for 10/27, explained surgeon's office will be calling, packet sent via email to [email or web address]

## 2020-07-04 NOTE — Progress Notes (Signed)
Radiation Oncology         [phone] ________________________________  Initial Outpatient Consultation  Name: Cindy [name] MRN: [id number]  Date: 07/05/2020  DOB: 08-Jul-1946  NA:[id number], Lanelle Bal, DO  Stark Klein, MD   REFERRING PHYSICIAN: Stark Klein, MD  DIAGNOSIS:    ICD-10-CM   1. Malignant neoplasm of upper-outer quadrant of left breast in female, estrogen receptor negative ([place])  C50.412    Z17.1     Cancer Staging Malignant neoplasm of upper-outer quadrant of left breast in female, estrogen receptor negative ([place]) Staging form: Breast, AJCC 8th Edition - Clinical stage from 07/05/2020: Stage IB (cT1c, cN0, cM0, G3, ER-, PR-, HER2-) - Signed by Nicholas Lose, MD on 07/05/2020   CHIEF COMPLAINT: Here to discuss management of left breast cancer  HISTORY OF PRESENT ILLNESS::Cindy [name] is a 74 y.o. female who presented with left breast abnormality on the following imaging: bilateral screening mammogram on the date of 05/10/2020. No symptoms were reported at that time. Ultrasound of left breast on 06/05/2020 revealed interval development of two nearby 8 mm and 7 mm indeterminate masses at the 1 o'clock position. Left axillary lymph nodes appeared normal. 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  ER status: 0% negative; PR status: 0% negative; Her2 status: negative; Grade 3.  She is in her usual state of health otherwise.  PREVIOUS RADIATION THERAPY: No  PAST MEDICAL HISTORY:  has a past medical history of Abnormal mammogram, Allergic rhinitis (10/30/2018), Anxiety in acute stress reaction (10/30/2018), Atypical chest pain (10/30/2018), Colon polyps, Family history of breast cancer (07/05/2020), Family history of pancreatic cancer (07/05/2020), Family history of prostate cancer (07/05/2020), GERD (gastroesophageal reflux disease) (10/30/2018), Hypertension  (10/30/2018), Hyperthyroidism, Hypothyroidism (10/30/2018), Migraine (10/30/2018), Osteoporosis (10/30/2018), Postmenopausal atrophic vaginitis (10/30/2018), and Vitamin D deficiency (10/30/2018).    PAST SURGICAL HISTORY: Past Surgical History:  Procedure Laterality Date  . HYSTERECTOMY ABDOMINAL WITH SALPINGECTOMY    . TONSILLECTOMY      FAMILY HISTORY: family history includes Alzheimer's disease in her mother; Breast cancer in her cousin, maternal aunt, maternal aunt, maternal grandmother, and mother; Cancer (age of onset: 44) in her maternal aunt; Heart attack in her father; High blood pressure in her mother; Leukemia in her paternal grandfather; Pancreatic cancer in her cousin; Prostate cancer (age of onset: 73) in her brother.  SOCIAL HISTORY:  reports that she has never smoked. She has never used smokeless tobacco. She reports that she does not drink alcohol and does not use drugs.  ALLERGIES: Sulfamethoxazole-trimethoprim and Tetracyclines & related  MEDICATIONS:  Current Outpatient Medications  Medication Sig Dispense Refill  . celecoxib (CELEBREX) 200 MG capsule One to 2 tablets by mouth daily as needed for pain. 180 capsule 3  . cetirizine (ZYRTEC) 10 MG tablet Take 1 tablet (10 mg total) by mouth at bedtime. 90 tablet 1  . denosumab (PROLIA) 60 MG/ML SOSY injection Inject into the skin every 6 (six) months.    . diclofenac sodium (VOLTAREN) 1 % GEL Apply 4 g topically 4 (four) times daily. 100 g 11  . esomeprazole (NEXIUM) 40 MG capsule Take 1 capsule (40 mg total) by mouth daily. 90 capsule 2  . hydrochlorothiazide (HYDRODIURIL) 25 MG tablet Take 1 tablet (25 mg total) by mouth daily. As need for swelling 90 tablet 0  . levothyroxine (SYNTHROID) 75 MCG tablet Take 1 tablet (75 mcg total) by mouth daily before breakfast. Take on Days  1-6 and skip Day 7- NEED ANNUAL EXAM AND LABS 90 tablet 2  . montelukast (SINGULAIR) 10 MG tablet Take 1 tablet (10 mg total) by mouth at bedtime. 90  tablet 1  . NIFEdipine (PROCARDIA-XL/NIFEDICAL-XL) 30 MG 24 hr tablet Take 1 tablet by mouth once daily 90 tablet 2  . nitroGLYCERIN (NITROSTAT) 0.4 MG SL tablet Place 1 tablet (0.4 mg total) under the tongue every 5 (five) minutes as needed for chest pain. 20 tablet 1  . rosuvastatin (CRESTOR) 20 MG tablet Take 1 tablet (20 mg total) by mouth daily. 90 tablet 3  . SUMAtriptan (IMITREX) 100 MG tablet Take 0.5-1 tablets (50-100 mg total) by mouth every 2 (two) hours as needed for migraine (Max 2 pills per 24 hours). 10 tablet 3  . vitamin B-12 (CYANOCOBALAMIN) 100 MCG tablet Take 100 mcg by mouth daily.    . Vitamin D, Ergocalciferol, (DRISDOL) 1.25 MG (50000 UT) CAPS capsule      No current facility-administered medications for this encounter.    REVIEW OF SYSTEMS: As above    PHYSICAL EXAM:  vitals were not taken for this visit.   General: Alert and oriented, in no acute distress HEENT: Head is normocephalic. Extraocular movements are intact. Heart: Regular in rate and rhythm with no murmurs, rubs, or gallops. Chest: Clear to auscultation bilaterally, with no rhonchi, wheezes, or rales. Psychiatric: Judgment and insight are intact. Affect is appropriate. Breasts: there is some masslike  firmness in the UOQ of the left breast over a 2 cm region where biopsy was performed . No other palpable masses appreciated in the breasts or axillae bilaterally .   ECOG = 0  0 - Asymptomatic (Fully active, able to carry on all predisease activities without restriction)  1 - Symptomatic but completely ambulatory (Restricted in physically strenuous activity but ambulatory and able to carry out work of a light or sedentary nature. For example, light housework, office work)  2 - Symptomatic, <50% in bed during the day (Ambulatory and capable of all self care but unable to carry out any work activities. Up and about more than 50% of waking hours)  3 - Symptomatic, >50% in bed, but not bedbound (Capable of  only limited self-care, confined to bed or chair 50% or more of waking hours)  4 - Bedbound (Completely disabled. Cannot carry on any self-care. Totally confined to bed or chair)  5 - Death   Eustace Pen MM, Creech RH, Tormey DC, et al. [phone]). "Toxicity and response criteria of the [facility]". [place] Oncol. 5 (6): 649-55   LABORATORY DATA:  Lab Results  Component Value Date   WBC 6.3 07/05/2020   HGB 14.5 07/05/2020   HCT 44.3 07/05/2020   MCV 95.1 07/05/2020   PLT 198 07/05/2020   CMP     Component Value Date/Time   NA 141 07/05/2020 0900   K 3.9 07/05/2020 0900   CL 103 07/05/2020 0900   CO2 31 07/05/2020 0900   GLUCOSE 98 07/05/2020 0900   BUN 24 (H) 07/05/2020 0900   CREATININE 1.12 (H) 07/05/2020 0900   CREATININE 0.90 05/18/2020 1120   CALCIUM 9.2 07/05/2020 0900   PROT 7.0 07/05/2020 0900   ALBUMIN 4.0 07/05/2020 0900   AST 22 07/05/2020 0900   ALT 19 07/05/2020 0900   ALKPHOS 64 07/05/2020 0900   BILITOT 0.7 07/05/2020 0900   GFRNONAA 51 (L) 07/05/2020 0900   GFRNONAA 60 03/02/2020 1019   GFRAA 70 03/02/2020 1019  RADIOGRAPHY: MM CLIP PLACEMENT LEFT  Result Date: 06/26/2020 CLINICAL DATA:  Post ultrasound-guided core biopsy of 2 adjacent masses in the upper-outer left breast at the 1 o'clock position. EXAM: DIAGNOSTIC LEFT MAMMOGRAM POST ULTRASOUND BIOPSY COMPARISON:  Previous exams. FINDINGS: Mammographic images were obtained following ultrasound guided biopsy of 2 adjacent masses in the upper-outer left breast at the 1 o'clock position. A ribbon shaped biopsy marking clip is present at the site of the biopsied slightly larger mass at the 1 o'clock position. A coil shaped biopsy marking clip is present at the site of the biopsied slightly smaller mass at the 1 o'clock position. IMPRESSION: 1. Ribbon shaped biopsy marking clip at site of the slightly larger mass in the left breast at the 1 o'clock position (site 1 large). 2. Coil  shaped biopsy marking clip at site of the slightly smaller mass in the left breast at the 1 o'clock position (site 2 small). Final Assessment: Post Procedure Mammograms for Marker Placement Electronically Signed   By: Everlean Alstrom M.D.   On: 06/26/2020 13:56   [place] LT BREAST BX W LOC DEV 1ST LESION IMG BX SPEC US GUIDE  Addendum Date: 06/29/2020   ADDENDUM REPORT: 06/28/2020 11:26 ADDENDUM: Pathology revealed GRADE III INVASIVE DUCTAL CARCINOMA WITH SQUAMOUS DIFFERENTIATION, DUCTAL CARCINOMA IN SITU of the LEFT breast, 1 o'clock. This was found to be concordant by Dr. Everlean Alstrom. Pathology revealed GRADE III INVASIVE DUCTAL CARCINOMA WITH SQUAMOUS DIFFERENTIATION, DUCTAL CARCINOMA IN SITU of the LEFT breast, 1 o'clock. This was found to be concordant by Dr. Everlean Alstrom. Pathology results were discussed with the patient by telephone. The patient reported doing well after the biopsies with tenderness at the sites. Post biopsy instructions and care were reviewed and questions were answered. The patient was encouraged to call [facility] for any additional concerns. The patient was referred to [facility] on July 05, 2020. Pathology results reported by Stacie Acres RN on 06/28/2020. Electronically Signed   By: Everlean Alstrom M.D.   On: 06/28/2020 11:26   Result Date: 06/29/2020 CLINICAL DATA:  74 year old female with 2 adjacent 0.8 cm and 0.7 cm masses in the left breast at the 1 o'clock position. EXAM: ULTRASOUND GUIDED LEFT BREAST CORE NEEDLE BIOPSY COMPARISON:  Previous exam(s). PROCEDURE: I met with the patient and we discussed the procedure of ultrasound-guided biopsy, including benefits and alternatives. We discussed the high likelihood of a successful procedure. We discussed the risks of the procedure, including infection, bleeding, tissue injury, clip migration, and inadequate sampling.  Informed written consent was given. The usual time-out protocol was performed immediately prior to the procedure. SITE 1: LEFT BREAST 1 O'CLOCK LARGE: Lesion quadrant: UPPER-OUTER Using sterile technique and 1% Lidocaine as local anesthetic, under direct ultrasound visualization, a 14 gauge spring-loaded device was used to perform biopsy of the slightly larger 0.8 cm mass in the upper-outer left breast at the 1 o'clock position using a lateral to medial approach. At the conclusion of the procedure ribbon shaped tissue marker clip was deployed into the biopsy cavity. Follow up 2 view mammogram was performed and dictated separately. SITE 2: LEFT BREAST 1 O'CLOCK SMALL: Lesion quadrant: UPPER-OUTER Using sterile technique and 1% Lidocaine as local anesthetic, under direct ultrasound visualization, a 14 gauge spring-loaded device was used to perform biopsy of the slightly smaller 0.7 cm mass using a lateral to medial approach. At the conclusion of the procedure a coil  shaped tissue marker clip was deployed into the biopsy cavity. Follow up 2 view mammogram was performed and dictated separately. IMPRESSION: 1. Ultrasound-guided biopsy of the slightly larger mass in the left breast at the 1 o'clock position, at site of ribbon shaped biopsy marking clip. 2. Ultrasound-guided biopsy of the smaller mass in the left breast at the 1 o'clock position, at site of coil shaped biopsy marking clip. Electronically Signed: By: Everlean Alstrom M.D. On: 06/26/2020 13:49   [place] LT BREAST BX W LOC DEV EA ADD LESION IMG BX SPEC US GUIDE  Addendum Date: 06/29/2020   ADDENDUM REPORT: 06/28/2020 11:26 ADDENDUM: Pathology revealed GRADE III INVASIVE DUCTAL CARCINOMA WITH SQUAMOUS DIFFERENTIATION, DUCTAL CARCINOMA IN SITU of the LEFT breast, 1 o'clock. This was found to be concordant by Dr. Everlean Alstrom. Pathology revealed GRADE III INVASIVE DUCTAL CARCINOMA WITH SQUAMOUS DIFFERENTIATION, DUCTAL CARCINOMA IN SITU of the LEFT breast, 1  o'clock. This was found to be concordant by Dr. Everlean Alstrom. Pathology results were discussed with the patient by telephone. The patient reported doing well after the biopsies with tenderness at the sites. Post biopsy instructions and care were reviewed and questions were answered. The patient was encouraged to call [facility] for any additional concerns. The patient was referred to [facility] at [facility] on July 05, 2020. Pathology results reported by Stacie Acres RN on 06/28/2020. Electronically Signed   By: Everlean Alstrom M.D.   On: 06/28/2020 11:26   Result Date: 06/29/2020 CLINICAL DATA:  74 year old female with 2 adjacent 0.8 cm and 0.7 cm masses in the left breast at the 1 o'clock position. EXAM: ULTRASOUND GUIDED LEFT BREAST CORE NEEDLE BIOPSY COMPARISON:  Previous exam(s). PROCEDURE: I met with the patient and we discussed the procedure of ultrasound-guided biopsy, including benefits and alternatives. We discussed the high likelihood of a successful procedure. We discussed the risks of the procedure, including infection, bleeding, tissue injury, clip migration, and inadequate sampling. Informed written consent was given. The usual time-out protocol was performed immediately prior to the procedure. SITE 1: LEFT BREAST 1 O'CLOCK LARGE: Lesion quadrant: UPPER-OUTER Using sterile technique and 1% Lidocaine as local anesthetic, under direct ultrasound visualization, a 14 gauge spring-loaded device was used to perform biopsy of the slightly larger 0.8 cm mass in the upper-outer left breast at the 1 o'clock position using a lateral to medial approach. At the conclusion of the procedure ribbon shaped tissue marker clip was deployed into the biopsy cavity. Follow up 2 view mammogram was performed and dictated separately. SITE 2: LEFT BREAST 1 O'CLOCK SMALL: Lesion quadrant: UPPER-OUTER Using sterile technique and  1% Lidocaine as local anesthetic, under direct ultrasound visualization, a 14 gauge spring-loaded device was used to perform biopsy of the slightly smaller 0.7 cm mass using a lateral to medial approach. At the conclusion of the procedure a coil shaped tissue marker clip was deployed into the biopsy cavity. Follow up 2 view mammogram was performed and dictated separately. IMPRESSION: 1. Ultrasound-guided biopsy of the slightly larger mass in the left breast at the 1 o'clock position, at site of ribbon shaped biopsy marking clip. 2. Ultrasound-guided biopsy of the smaller mass in the left breast at the 1 o'clock position, at site of coil shaped biopsy marking clip. Electronically Signed: By: Everlean Alstrom M.D. On: 06/26/2020 13:49      IMPRESSION/PLAN: Left breast cancer  It was a pleasure meeting the patient today. We discussed  the risks, benefits, and side effects of radiotherapy. I recommend radiotherapy to the left breast to reduce her risk of locoregional recurrence by 2/3.  We discussed that radiation would take approximately 4 weeks to complete and that I would give the patient a few weeks to heal following surgery before starting treatment planning.  If chemotherapy were to be given, this would precede radiotherapy. We spoke about acute effects including skin irritation and fatigue as well as much less common late effects including internal organ injury or irritation. We spoke about the latest technology that is used to minimize the risk of late effects for patients undergoing radiotherapy to the breast or chest wall. No guarantees of treatment were given. The patient is enthusiastic about proceeding with treatment. I look forward to participating in the patient's care.  I will await her referral back to me for postoperative follow-up and eventual CT simulation/treatment planning.  We discussed measures to reduce the risk of infection during the COVID-19 pandemic. She has not been vaccinated yet. We  discussed the risks and benefits of the vaccine. After a thorough discussion, she states she would like to proceed with vaccination today.  She will receive her first [place] shot at the [facility] before leaving.  On date of service, in total, I spent 45 minutes on this encounter. Patient was seen in person.   __________________________________________   Eppie Gibson, MD  This document serves as a record of services personally performed by Eppie Gibson, MD. It was created on his behalf by Clerance Lav, a trained medical scribe. The creation of this record is based on the scribe's personal observations and the provider's statements to them. This document has been checked and approved by the attending provider.

## 2020-07-04 NOTE — Progress Notes (Signed)
[place] NOTE  Patient Care Team: Emeterio Reeve, DO as PCP - General (Osteopathic Medicine) Mauro Kaufmann, RN as Oncology Nurse Navigator Rockwell Germany, RN as Oncology Nurse Navigator Stark Klein, MD as Consulting Physician (General Surgery) Nicholas Lose, MD as Consulting Physician (Hematology and Oncology) Eppie Gibson, MD as Attending Physician (Radiation Oncology)  CHIEF COMPLAINTS/PURPOSE OF CONSULTATION:  Newly diagnosed breast cancer  HISTORY OF PRESENTING ILLNESS:  Cindy [name] 74 y.o. female is here because of recent diagnosis of invasive ductal carcinoma of the left breast. Screening mammogram on 05/10/20 showed a possible left breast mass. Diagnostic mammogram on 06/05/20 showed two adjacent masses at the 1 o'clock position in the left breast, 0.7cm and 0.8cm, about 0.5cm apart, and spanning in total 1.8cm, no left axillary adenopathy. Biopsy on 06/26/20 showed invasive ductal carcinoma with squamous differentiation and DCIS, grade 3, HER-2 negative (1+), ER/PR negative, Ki67 80%. She has a family history of breast cancer in her mother and two maternal aunts. She presents to the clinic today for initial evaluation and discussion of treatment options.   I reviewed her records extensively and collaborated the history with the patient.  SUMMARY OF ONCOLOGIC HISTORY: Oncology History  Malignant neoplasm of upper-outer quadrant of left breast in female, estrogen receptor negative ([place])  06/30/2020 Initial Diagnosis   Screening mammogram showed a possible left breast mass. Diagnostic mammogram showed two adjacent masses at the 1 o'clock position, 0.7cm and 0.8cm, about 0.5cm apart, and spanning in total 1.8cm, no left axillary adenopathy. Biopsy showed IDC with squamous differentiation and DCIS, grade 3, HER-2 negative (1+), ER/PR negative, Ki67 80%.    07/05/2020 Cancer Staging   Staging form: Breast, AJCC 8th Edition - Clinical stage from  07/05/2020: Stage IB (cT1c, cN0, cM0, G3, ER-, PR-, HER2-) - Signed by Nicholas Lose, MD on 07/05/2020      MEDICAL HISTORY:  Past Medical History:  Diagnosis Date  . Abnormal mammogram   . Allergic rhinitis 10/30/2018  . Anxiety in acute stress reaction 10/30/2018  . Atypical chest pain 10/30/2018  . Colon polyps   . GERD (gastroesophageal reflux disease) 10/30/2018  . Hypertension 10/30/2018  . Hyperthyroidism   . Hypothyroidism 10/30/2018  . Migraine 10/30/2018  . Osteoporosis 10/30/2018  . 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  . Vitamin D deficiency 10/30/2018    SURGICAL HISTORY: Past Surgical History:  Procedure Laterality Date  . HYSTERECTOMY ABDOMINAL WITH SALPINGECTOMY    . TONSILLECTOMY      SOCIAL HISTORY: Social History   Socioeconomic History  . Marital status: Married    Spouse name: Not on file  . Number of children: 2  . Years of education: Not on file  . Highest education level: Not on file  Occupational History  . Not on file  Tobacco Use  . Smoking status: Never Smoker  . Smokeless tobacco: Never Used  Vaping Use  . Vaping Use: Never used  Substance and Sexual Activity  . Alcohol use: Never  . Drug use: Never  . Sexual activity: Yes    Partners: Male  Other Topics Concern  . Not on file  Social History Narrative  . Not on file   Social Determinants of Health   Financial Resource Strain:   . Difficulty of Paying Living Expenses: Not on file  Food Insecurity: No Food Insecurity  . Worried About Charity fundraiser in the Last Year: Never true  . Ran Out of Food in the Last Year: Never true  Transportation Needs: No Transportation Needs  . Lack of Transportation (Medical): No  . Lack of Transportation (Non-Medical): No  Physical Activity:   . Days of Exercise per Week: Not on file  . Minutes of Exercise per Session: Not on file  Stress:   . Feeling of Stress : Not on file  Social Connections:   . Frequency of Communication with  Friends and Family: Not on file  . Frequency of Social Gatherings with Friends and Family: Not on file  . Attends Religious Services: Not on file  . Active Member of Clubs or Organizations: Not on file  . Attends Archivist Meetings: Not on file  . Marital Status: Not on file  Intimate Partner Violence:   . Fear of Current or Ex-Partner: Not on file  . Emotionally Abused: Not on file  . Physically Abused: Not on file  . Sexually Abused: Not on file    FAMILY HISTORY: Family History  Problem Relation Age of Onset  . High blood pressure Mother   . Breast cancer Mother   . Alzheimer's disease Mother   . Heart attack Father   . Prostate cancer Brother   . Leukemia Maternal Grandfather   . Breast cancer Maternal Aunt   . Breast cancer Maternal Aunt   . Breast cancer Maternal Grandmother     ALLERGIES:  is allergic to sulfamethoxazole-trimethoprim and tetracyclines & related.  MEDICATIONS:  Current Outpatient Medications  Medication Sig Dispense Refill  . celecoxib (CELEBREX) 200 MG capsule One to 2 tablets by mouth daily as needed for pain. 180 capsule 3  . cetirizine (ZYRTEC) 10 MG tablet Take 1 tablet (10 mg total) by mouth at bedtime. 90 tablet 1  . denosumab (PROLIA) 60 MG/ML SOSY injection Inject into the skin every 6 (six) months.    . diclofenac sodium (VOLTAREN) 1 % GEL Apply 4 g topically 4 (four) times daily. 100 g 11  . esomeprazole (NEXIUM) 40 MG capsule Take 1 capsule (40 mg total) by mouth daily. 90 capsule 2  . hydrochlorothiazide (HYDRODIURIL) 25 MG tablet Take 1 tablet (25 mg total) by mouth daily. As need for swelling 90 tablet 0  . levothyroxine (SYNTHROID) 75 MCG tablet Take 1 tablet (75 mcg total) by mouth daily before breakfast. Take on Days 1-6 and skip Day 7- NEED ANNUAL EXAM AND LABS 90 tablet 2  . montelukast (SINGULAIR) 10 MG tablet Take 1 tablet (10 mg total) by mouth at bedtime. 90 tablet 1  . NIFEdipine (PROCARDIA-XL/NIFEDICAL-XL) 30 MG 24 hr  tablet Take 1 tablet by mouth once daily 90 tablet 2  . nitroGLYCERIN (NITROSTAT) 0.4 MG SL tablet Place 1 tablet (0.4 mg total) under the tongue every 5 (five) minutes as needed for chest pain. 20 tablet 1  . rosuvastatin (CRESTOR) 20 MG tablet Take 1 tablet (20 mg total) by mouth daily. 90 tablet 3  . SUMAtriptan (IMITREX) 100 MG tablet Take 0.5-1 tablets (50-100 mg total) by mouth every 2 (two) hours as needed for migraine (Max 2 pills per 24 hours). 10 tablet 3  . vitamin B-12 (CYANOCOBALAMIN) 100 MCG tablet Take 100 mcg by mouth daily.    . Vitamin D, Ergocalciferol, (DRISDOL) 1.25 MG (50000 UT) CAPS capsule      No current facility-administered medications for this visit.    REVIEW OF SYSTEMS:     All other systems were reviewed with the patient and are negative.  PHYSICAL EXAMINATION: ECOG PERFORMANCE STATUS: 1 - Symptomatic but completely ambulatory  Vitals:   07/05/20 0922  BP: 121/66  Pulse: 78  Resp: 18  Temp: 98 F (36.7 C)  SpO2: 98%   Filed Weights   07/05/20 0922  Weight: 147 lb 3.2 oz (66.8 kg)      LABORATORY DATA:  I have reviewed the data as listed Lab Results  Component Value Date   WBC 6.3 07/05/2020   HGB 14.5 07/05/2020   HCT 44.3 07/05/2020   MCV 95.1 07/05/2020   PLT 198 07/05/2020   Lab Results  Component Value Date   NA 141 07/05/2020   K 3.9 07/05/2020   CL 103 07/05/2020   CO2 31 07/05/2020    RADIOGRAPHIC STUDIES: I have personally reviewed the radiological reports and agreed with the findings in the report.  ASSESSMENT AND PLAN:  Malignant neoplasm of upper-outer quadrant of left breast in female, estrogen receptor negative ([place]) 06/30/2020:Screening mammogram showed a possible left breast mass. Diagnostic mammogram showed two adjacent masses at the 1 o'clock position, 0.7cm and 0.8cm, about 0.5cm apart, and spanning in total 1.8cm, no left axillary adenopathy. Biopsy showed IDC with squamous differentiation and DCIS, grade 3,  HER-2 negative (1+), ER/PR negative, Ki67 80%.  T1c N0 stage Ib  Pathology and radiology counseling: Discussed with the patient, the details of pathology including the type of breast cancer,the clinical staging, the significance of ER, PR and HER-2/neu receptors and the implications for treatment. After reviewing the pathology in detail, we proceeded to discuss the different treatment options between surgery, radiation, chemotherapy, antiestrogen therapies.  Treatment plan: 1.  Breast conserving surgery with sentinel lymph node biopsy 2. adjuvant chemotherapy with CMF x6 cycles 3.  Follow-up adjuvant radiation Genetic testing will also be done Chemo counseling: I discussed with the risks and benefits of chemotherapy in great detail. Return to clinic after surgery to discuss final pathology report and to finalize the adjuvant chemotherapy plan.  All questions were answered. The patient knows to call the clinic with any problems, questions or concerns.   Rulon Eisenmenger, MD, MPH 07/05/2020    I, Molly Dorshimer, am acting as scribe for Nicholas Lose, MD.  I have reviewed the above documentation for accuracy and completeness, and I agree with the above.

## 2020-07-05 ENCOUNTER — Ambulatory Visit: Payer: Medicare Other | Attending: General Surgery | Admitting: Physical Therapy

## 2020-07-05 ENCOUNTER — Encounter: Payer: Self-pay | Admitting: *Deleted

## 2020-07-05 ENCOUNTER — Ambulatory Visit (HOSPITAL_BASED_OUTPATIENT_CLINIC_OR_DEPARTMENT_OTHER): Payer: Medicare Other | Admitting: Genetic Counselor

## 2020-07-05 ENCOUNTER — Inpatient Hospital Stay: Payer: Medicare Other | Attending: Hematology and Oncology | Admitting: Hematology and Oncology

## 2020-07-05 ENCOUNTER — Ambulatory Visit
Admission: RE | Admit: 2020-07-05 | Discharge: 2020-07-05 | Disposition: A | Discharge status: Home / Self Care | Payer: Medicare Other | Source: Ambulatory Visit | Attending: Radiation Oncology | Admitting: Radiation Oncology

## 2020-07-05 ENCOUNTER — Encounter: Payer: Self-pay | Admitting: Genetic Counselor

## 2020-07-05 ENCOUNTER — Encounter: Payer: Self-pay | Admitting: Physical Therapy

## 2020-07-05 ENCOUNTER — Inpatient Hospital Stay: Payer: Medicare Other

## 2020-07-05 ENCOUNTER — Inpatient Hospital Stay: Payer: Medicare Other | Admitting: Licensed Clinical Social Worker

## 2020-07-05 ENCOUNTER — Other Ambulatory Visit: Payer: Self-pay | Admitting: General Surgery

## 2020-07-05 ENCOUNTER — Other Ambulatory Visit: Payer: Self-pay

## 2020-07-05 DIAGNOSIS — R079 Chest pain, unspecified: Secondary | ICD-10-CM | POA: Diagnosis not present

## 2020-07-05 DIAGNOSIS — Z23 Encounter for immunization: Secondary | ICD-10-CM | POA: Diagnosis not present

## 2020-07-05 DIAGNOSIS — C50412 Malignant neoplasm of upper-outer quadrant of left female breast: Secondary | ICD-10-CM

## 2020-07-05 DIAGNOSIS — Z8042 Family history of malignant neoplasm of prostate: Secondary | ICD-10-CM

## 2020-07-05 DIAGNOSIS — Z170 Estrogen receptor positive status [ER+]: Secondary | ICD-10-CM | POA: Insufficient documentation

## 2020-07-05 DIAGNOSIS — R293 Abnormal posture: Secondary | ICD-10-CM

## 2020-07-05 DIAGNOSIS — Z171 Estrogen receptor negative status [ER-]: Secondary | ICD-10-CM | POA: Diagnosis not present

## 2020-07-05 DIAGNOSIS — Z806 Family history of leukemia: Secondary | ICD-10-CM | POA: Diagnosis not present

## 2020-07-05 DIAGNOSIS — Z800 Family history of malignant neoplasm of digestive organs: Secondary | ICD-10-CM

## 2020-07-05 DIAGNOSIS — Z803 Family history of malignant neoplasm of breast: Secondary | ICD-10-CM

## 2020-07-05 HISTORY — DX: Family history of malignant neoplasm of digestive organs: Z80.0

## 2020-07-05 HISTORY — DX: Family history of malignant neoplasm of prostate: Z80.42

## 2020-07-05 HISTORY — DX: Family history of malignant neoplasm of breast: Z80.3

## 2020-07-05 LAB — CBC WITH DIFFERENTIAL (CANCER CENTER ONLY)
Abs Immature Granulocytes: 0.03 10*3/uL (ref 0.00–0.07)
Basophils Absolute: 0.1 10*3/uL (ref 0.0–0.1)
Basophils Relative: 1 %
Eosinophils Absolute: 0.1 10*3/uL (ref 0.0–0.5)
Eosinophils Relative: 2 %
HCT: 44.3 % (ref 36.0–46.0)
Hemoglobin: 14.5 g/dL (ref 12.0–15.0)
Immature Granulocytes: 1 %
Lymphocytes Relative: 29 %
Lymphs Abs: 1.8 10*3/uL (ref 0.7–4.0)
MCH: 31.1 pg (ref 26.0–34.0)
MCHC: 32.7 g/dL (ref 30.0–36.0)
MCV: 95.1 fL (ref 80.0–100.0)
Monocytes Absolute: 0.7 10*3/uL (ref 0.1–1.0)
Monocytes Relative: 10 %
Neutro Abs: 3.7 10*3/uL (ref 1.7–7.7)
Neutrophils Relative %: 57 %
Platelet Count: 198 10*3/uL (ref 150–400)
RBC: 4.66 MIL/uL (ref 3.87–5.11)
RDW: 12.3 % (ref 11.5–15.5)
WBC Count: 6.3 10*3/uL (ref 4.0–10.5)
nRBC: 0 % (ref 0.0–0.2)

## 2020-07-05 LAB — CMP (CANCER CENTER ONLY)
ALT: 19 U/L (ref 0–44)
AST: 22 U/L (ref 15–41)
Albumin: 4 g/dL (ref 3.5–5.0)
Alkaline Phosphatase: 64 U/L (ref 38–126)
Anion gap: 7 (ref 5–15)
BUN: 24 mg/dL — ABNORMAL HIGH (ref 8–23)
CO2: 31 mmol/L (ref 22–32)
Calcium: 9.2 mg/dL (ref 8.9–10.3)
Chloride: 103 mmol/L (ref 98–111)
Creatinine: 1.12 mg/dL — ABNORMAL HIGH (ref 0.44–1.00)
GFR, Estimated: 51 mL/min — ABNORMAL LOW (ref >60)
Glucose, Bld: 98 mg/dL (ref 70–99)
Potassium: 3.9 mmol/L (ref 3.5–5.1)
Sodium: 141 mmol/L (ref 135–145)
Total Bilirubin: 0.7 mg/dL (ref 0.3–1.2)
Total Protein: 7 g/dL (ref 6.5–8.1)

## 2020-07-05 LAB — GENETIC SCREENING ORDER

## 2020-07-05 NOTE — Patient Instructions (Signed)

## 2020-07-05 NOTE — Progress Notes (Signed)
REFERRING PROVIDER: Nicholas Lose, MD [address] [place],  [place] [address]  PRIMARY PROVIDER:  Emeterio Reeve, DO  PRIMARY REASON FOR VISIT:  1. Malignant neoplasm of upper-outer quadrant of left breast in female, estrogen receptor negative ([place])   2. Family history of breast cancer   3. Family history of prostate cancer   4. Family history of pancreatic cancer     HISTORY OF PRESENT ILLNESS:   Cindy [name], a 74 y.o. female, was seen for a [facility] cancer genetics consultation during breast multidisciplinary clinic at the request of Dr. Lindi Adie due to a personal and family history of breast cancer.  [name] presents to clinic today with her daughter to discuss the possibility of a hereditary predisposition to cancer, to discuss genetic testing, and to further clarify her future cancer risks, as well as potential cancer risks for family members.   In October 2021, at the age of 11, [name] was diagnosed with invasive ductal carcinoma of the left breast (ER-/PR-/HER2-). The preliminary treatment plan includes breast conserving surgery with sentinel lymph node biopsy, adjuvant chemotherapy, and adjuvant radiation.   CANCER HISTORY:  Oncology History  Malignant neoplasm of upper-outer quadrant of left breast in female, estrogen receptor negative ([place])  06/30/2020 Initial Diagnosis   Screening mammogram showed a possible left breast mass. Diagnostic mammogram showed two adjacent masses at the 1 o'clock position, 0.7cm and 0.8cm, about 0.5cm apart, and spanning in total 1.8cm, no left axillary adenopathy. Biopsy showed IDC with squamous differentiation and DCIS, grade 3, HER-2 negative (1+), ER/PR negative, Ki67 80%.    07/05/2020 Cancer Staging   Staging form: Breast, AJCC 8th Edition - Clinical stage from 07/05/2020: Stage IB (cT1c, cN0, cM0, G3, ER-, PR-, HER2-) - Signed by Nicholas Lose, MD on 07/05/2020    RISK FACTORS:  Menarche was at age 50.    First live birth at age 16.  OCP use for approximately 7 years.  Ovaries intact: not reported.  Hysterectomy: yes; hysterectomy abdominal with salpingectomy Menopausal status: postmenopausal.  HRT use: not reported  Colonoscopy: yes; most recent 2018 per patient. Mammogram within the last year: yes. Up to date with pelvic exams: most recent PAP in 2019 per patient .  Past Medical History:  Diagnosis Date   Abnormal mammogram    Allergic rhinitis 10/30/2018   Anxiety in acute stress reaction 10/30/2018   Atypical chest pain 10/30/2018   Colon polyps    Family history of breast cancer 07/05/2020   Family history of pancreatic cancer 07/05/2020   Family history of prostate cancer 07/05/2020   GERD (gastroesophageal reflux disease) 10/30/2018   Hypertension 10/30/2018   Hyperthyroidism    Hypothyroidism 10/30/2018   Migraine 10/30/2018   Osteoporosis 10/30/2018   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   Vitamin D deficiency 10/30/2018    Past Surgical History:  Procedure Laterality Date   HYSTERECTOMY ABDOMINAL WITH SALPINGECTOMY     TONSILLECTOMY      Social History   Socioeconomic History   Marital status: Married    Spouse name: Not on file   Number of children: 2   Years of education: Not on file   Highest education level: Not on file  Occupational History   Not on file  Tobacco Use   Smoking status: Never Smoker   Smokeless tobacco: Never Used  Vaping Use   Vaping Use: Never used  Substance and Sexual Activity   Alcohol use: Never   Drug use: Never   Sexual activity: Yes  Partners: Male  Other Topics Concern   Not on file  Social History Narrative   Not on file   Social Determinants of Health   Financial Resource Strain:    Difficulty of Paying Living Expenses: Not on file  Food Insecurity: No Food Insecurity   Worried About Running Out of Food in the Last Year: Never true   Ran Out of Food in the Last Year: Never  true  Transportation Needs: No Transportation Needs   Lack of Transportation (Medical): No   Lack of Transportation (Non-Medical): No  Physical Activity:    Days of Exercise per Week: Not on file   Minutes of Exercise per Session: Not on file  Stress:    Feeling of Stress : Not on file  Social Connections:    Frequency of Communication with Friends and Family: Not on file   Frequency of Social Gatherings with Friends and Family: Not on file   Attends Religious Services: Not on file   Active Member of Clubs or Organizations: Not on file   Attends Archivist Meetings: Not on file   Marital Status: Not on file     FAMILY HISTORY:  We obtained a detailed, 4-generation family history.  Significant diagnoses are listed below: Family History  Problem Relation Age of Onset   Breast cancer Mother        dx before 77   Prostate cancer Brother 25   Breast cancer Maternal Aunt        dx 76s   Breast cancer Maternal Aunt        dx 32s   Breast cancer Maternal Grandmother        dx after 56   Leukemia Paternal Grandfather        dx 10s-80s   Pancreatic cancer Cousin        maternal; dx 52s   Cancer Maternal Aunt 50       unknown type   Breast cancer Cousin        paternal; dx 73s   Ms. Radman has one son, age 87, and one daughter, age 16.  Ms. Ambroise had one brother who was diagnosed with prostate cancer at age 78 and passed away at age 42.  Ms. Kanaan mother was diagnosed with breast cancer before the age of 42 and passed away at age 79.  Ms. Veldman had two maternal aunts diagnosed with breast cancer in their 82s and one maternal aunt with an unknown type of cancer diagnosed around 29.  Ms. Juul had a maternal cousin diagnosed with pancreatic cancer at age 12.  Ms. Patty maternal grandmother was diagnosed with breast cancer after the age of 35 and passed away in his 37s-80s.  Ms. Hanko father passed away at age 45. Ms. Hedstrom  had a paternal cousin diagnosed with breast cancer in her 67s.  Ms. Gershon Cull paternal grandfather was diagnosed with leukemia in his 61s or 9s. No other paternal family history of cancer was reported.   Ms. King is unaware of previous family history of genetic testing for hereditary cancer risks. Patient's maternal ancestors are of English/Irish descent, and paternal ancestors are of English descent. There is no reported Ashkenazi Jewish ancestry. There is no known consanguinity.  GENETIC COUNSELING ASSESSMENT: Ms. Economou is a 74 y.o. female with a personal history of triple negative breast cancer and family history of breast cancer and pancreatic cancer which is somewhat suggestive of a hereditary cancer syndrome and predisposition to cancer given related diagnoses  in multiple generations. We, therefore, discussed and recommended the following at today's visit.   DISCUSSION: We discussed that 5 - 10% of cancer is hereditary, with most cases of hereditary breast cancer associated with mutations in BRCA1/2.  There are other genes that can be associated with hereditary breast cancer syndromes.  Type of cancer risk and level of risk are gene-specific.  We discussed that testing is beneficial for several reasons including knowing how to follow individuals after completing their treatment, identifying whether potential treatment options would be beneficial, and understanding if other family members could be at risk for cancer and allowing them to undergo genetic testing.   We reviewed the characteristics, features and inheritance patterns of hereditary cancer syndromes. We also discussed genetic testing, including the appropriate family members to test, the process of testing, insurance coverage and turn-around-time for results. We discussed the implications of a negative, positive and/or variant of uncertain significant result. In order to get genetic test results in a timely manner so that Ms.  Loveall can use these genetic test results for surgical decisions, we recommended Ms. Kuba pursue genetic testing for the STAT Breast Cancer Panel.  The STAT Breast cancer panel offered by [facility] includes sequencing and rearrangement analysis for the following 9 genes:  ATM, BRCA1, BRCA2, CDH1, CHEK2, PALB2, PTEN, STK11 and TP53.  Once complete, we recommend Ms. Parkinson pursue reflex genetic testing to a more comprehensive gene panel.   Ms. Cawley  was offered a common hereditary cancer panel (48 genes) and an expanded pan-cancer panel (85 genes). Ms. Hark was informed of the benefits and limitations of each panel, including that expanded pan-cancer panels contain several preliminary evidence genes that do not have clear management guidelines at this point in time.  We also discussed that as the number of genes included on a panel increases, the chances of variants of uncertain significance increases.  After considering the benefits and limitations of each gene panel, Ms. Tat elected to have a common hereditary cancer panel through [facility].  The Common Hereditary Cancers Panel offered by [facility] includes sequencing and/or deletion duplication testing of the following 48 genes: APC, ATM, AXIN2, BARD1, BMPR1A, BRCA1, BRCA2, BRIP1, CDH1, CDK4, CDKN2A (p14ARF), CDKN2A (p16INK4a), CHEK2, CTNNA1, DICER1, EPCAM (Deletion/duplication testing only), GREM1 (promoter region deletion/duplication testing only), KIT, MEN1, MLH1, MSH2, MSH3, MSH6, MUTYH, NBN, NF1, NHTL1, PALB2, PDGFRA, PMS2, POLD1, POLE, PTEN, RAD50, RAD51C, RAD51D, RNF43, SDHB, SDHC, SDHD, SMAD4, SMARCA4. STK11, TP53, TSC1, TSC2, and VHL.  The following genes were evaluated for sequence changes only: SDHA and HOXB13 c.251G>A variant only.  Based on Ms. Derosa's personal history of triple negative breast cancer and family history of breast and pancreatic cancer, she meets medical criteria for genetic testing. Despite that she meets  criteria, she may still have an out of pocket cost. We discussed that if her out of pocket cost for testing is over $100, the laboratory will call and confirm whether she wants to proceed with testing.  If the out of pocket cost of testing is less than $100 she will be billed by the genetic testing laboratory.   PLAN: After considering the risks, benefits, and limitations, Ms. Ratti provided informed consent to pursue genetic testing and the blood sample was sent to [facility] for analysis of the STAT+Common Hereditary Cancers Panel. Results should be available within approximately 1-2 weeks' time, at which point they will be disclosed by telephone to Ms. Maclaren, as will any additional recommendations warranted by these results. Ms. Mcquary will  receive a summary of her genetic counseling visit and a copy of her results once available. This information will also be available in Epic.   Based on Ms. Herberger's family history, we recommended her brother's children have genetic counseling and testing. Ms. Matera will let us know if we can be of any assistance in coordinating genetic counseling and/or testing for these family members.   Lastly, we encouraged Ms. Karp to remain in contact with cancer genetics annually so that we can continuously update the family history and inform her of any changes in cancer genetics and testing that may be of benefit for this family.   Ms. Fouch questions were answered to her satisfaction today. Our contact information was provided should additional questions or concerns arise. Thank you for the referral and allowing [place] to share in the care of your patient.   Rama Mcclintock M. Joette Catching, [place], [facility] Certified Film/video [email or web address] (P) [phone]  The patient was seen for a total of 20 minutes in face-to-face genetic counseling.  This patient was discussed with Drs. Magrinat, Lindi Adie and/or Burr Medico who agrees with the above.     _______________________________________________________________________ For Office Staff:  Number of people involved in session: 1 Was an Intern/ student involved with case: no

## 2020-07-05 NOTE — Progress Notes (Signed)
[place] Work  Initial Assessment   Cindy [name] is a 74 y.o. year old female accompanied by patient and daughter, Nira Conn. Clinical Social Work was referred by [facility] for assessment of psychosocial needs.   SDOH (Social Determinants of Health) assessments performed: Yes SDOH Interventions     Most Recent Value  SDOH Interventions  Food Insecurity Interventions Intervention Not Indicated  Housing Interventions Intervention Not Indicated  Transportation Interventions Intervention Not Indicated      Distress Screen completed: Yes ONCBCN DISTRESS SCREENING 07/05/2020  Screening Type Initial Screening  Distress experienced in past week (1-10) 2  Emotional problem type Nervousness/Anxiety    Family/Social Information:  . Housing Arrangement: patient lives with husband, Sherlie Ban . Family members/support persons in your life? Family- husband, daughter, son. 4 grandchildren (2 72yo twins, 62yo, 32yo) . Transportation concerns: no  . Employment: Retired. Income source: Conservation officer, historic buildings . Financial concerns: No o Type of concern: None . Food access concerns: no . Services Currently in place:  n/a  Coping/ Adjustment to diagnosis: . Patient understands treatment plan and what happens next? yes . Concerns about diagnosis and/or treatment: I'm not especially worried about anything. Some normal nervousness around diagnosis and treatment, but taking it one day at a time . Patient reported stressors: Anxiety . Patient enjoys arts, crafts, working in the yard, time with grandkids . Current coping skills/ strengths: Capable of independent living, Communication skills and Motivation for treatment/growth    SUMMARY: Current SDOH Barriers:  . No major barriers noted today  Clinical Social Work Clinical Goal(s):  Marland Kitchen Patient will continue to attend medical appointments as recommended by team  Interventions: . Discussed common feeling and emotions when being  diagnosed with cancer, and the importance of support during treatment . Informed patient of the support team roles and support services at [facility] . Provided CSW contact information and encouraged patient to call with any questions or concerns   Follow Up Plan: Patient will contact this CSW with any support or resource needs Patient verbalizes understanding of plan: Yes   Edwinna Areola Aliviyah Malanga LCSW

## 2020-07-05 NOTE — Therapy (Signed)
[place], [place], [address] Phone: [phone]   Fax:  [phone]  Physical Therapy Evaluation  Patient Details  Name: Cindy [name] MRN: [id number] Date of Birth: [birth year] Referring Provider (PT): Dr. Stark Klein   Encounter Date: 07/05/2020   PT End of Session - 07/05/20 1417    Visit Number 1    Number of Visits 2    Date for PT Re-Evaluation 08/30/20    PT Start Time 8757    PT Stop Time 0954   Also saw pt from 1131-1150 for a total of 31 minutes   PT Time Calculation (min) 12 min    Activity Tolerance Patient tolerated treatment well    Behavior During Therapy [facility] for tasks assessed/performed           Past Medical History:  Diagnosis Date   Abnormal mammogram    Allergic rhinitis 10/30/2018   Anxiety in acute stress reaction 10/30/2018   Atypical chest pain 10/30/2018   Colon polyps    GERD (gastroesophageal reflux disease) 10/30/2018   Hypertension 10/30/2018   Hyperthyroidism    Hypothyroidism 10/30/2018   Migraine 10/30/2018   Osteoporosis 10/30/2018   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   Vitamin D deficiency 10/30/2018    Past Surgical History:  Procedure Laterality Date   HYSTERECTOMY ABDOMINAL WITH SALPINGECTOMY     TONSILLECTOMY      There were no vitals filed for this visit.    Subjective Assessment - 07/05/20 1006    Subjective Patient reports she is here today to be seen by her medical team for her newly diagnosed left breast cancer.    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 There are 2 small areas measuring 8 mm and 7 mm close together in the upper outer quadrant. Ki67 is 80%.    Patient Stated Goals Reduce lymphedema risk and learn post op shoulder ROM HEP    Currently in Pain? Yes    Pain Score --   Up to  10/10; no pain today   Pain Location Knee    Pain Orientation Right    Pain Descriptors / Indicators Aching;Sharp    Pain Type Chronic pain    Pain Onset More than a month ago    Pain Frequency Intermittent    Aggravating Factors  Walking    Pain Relieving Factors rest              [facility] PT Assessment - 07/05/20 0001      Assessment   Medical Diagnosis Left breast cancer    Referring Provider (PT) Dr. Stark Klein    Onset Date/Surgical Date 05/10/20    Hand Dominance Right    Prior Therapy none      Precautions   Precautions Other (comment)    Precaution Comments active cancer      Restrictions   Weight Bearing Restrictions No      Balance Screen   Has the patient fallen in the past 6 months No    Has the patient had a decrease in activity level because of a fear of falling?  No    Is the patient reluctant to leave their home because of a fear of falling?  No      Home Social worker Private residence    Living Arrangements Spouse/significant other    Available Help  at Discharge Family      Prior Function   Level of [place] Retired    Leisure She does yardwork but does not exercise      Cognition   Overall Cognitive Status Within Functional Limits for tasks assessed      Posture/Postural Control   Posture/Postural Control Postural limitations    Postural Limitations Rounded Shoulders;Forward head;Increased thoracic kyphosis   Significant kyphosis limiting shoulder ROM     ROM / Strength   AROM / PROM / Strength AROM;Strength      AROM   Overall AROM Comments Cervical AROM is WNL    AROM Assessment Site Shoulder    Right/Left Shoulder Right;Left    Right Shoulder Extension 47 Degrees    Right Shoulder Flexion 133 Degrees    Right Shoulder ABduction 153 Degrees    Right Shoulder Internal Rotation 61 Degrees    Right Shoulder External Rotation 69 Degrees    Left Shoulder Extension 53 Degrees    Left  Shoulder Flexion 142 Degrees    Left Shoulder ABduction 143 Degrees    Left Shoulder Internal Rotation 58 Degrees    Left Shoulder External Rotation 72 Degrees      Strength   Overall Strength Within functional limits for tasks performed             LYMPHEDEMA/ONCOLOGY QUESTIONNAIRE - 07/05/20 0001      Type   Cancer Type Left breast cancer      Lymphedema Assessments   Lymphedema Assessments Upper extremities      Right Upper Extremity Lymphedema   10 cm Proximal to Olecranon Process 25.1 cm    Olecranon Process 22.8 cm    10 cm Proximal to Ulnar Styloid Process 18.4 cm    Just Proximal to Ulnar Styloid Process 14.2 cm    Across Hand at [employer] 17.5 cm    At [place] of 2nd Digit 5.8 cm      Left Upper Extremity Lymphedema   10 cm Proximal to Olecranon Process 25.1 cm    Olecranon Process 23.1 cm    10 cm Proximal to Ulnar Styloid Process 18 cm    Just Proximal to Ulnar Styloid Process 14.4 cm    Across Hand at [employer] 17.7 cm    At [place] of 2nd Digit 5.8 cm           L-DEX FLOWSHEETS - 07/05/20 1400      L-DEX LYMPHEDEMA SCREENING   Measurement Type Unilateral    L-DEX MEASUREMENT EXTREMITY Upper Extremity    POSITION  Standing    DOMINANT SIDE Right    At Risk Side Left    BASELINE SCORE (UNILATERAL) 3.9                Quick Dash - 07/05/20 0001    Open a tight or new jar Moderate difficulty    Do heavy household chores (wash walls, wash floors) Mild difficulty    Carry a shopping bag or briefcase No difficulty    Wash your back No difficulty    Use a knife to cut food No difficulty    Recreational activities in which you take some force or impact through your arm, shoulder, or hand (golf, hammering, tennis) No difficulty    During the past week, to what extent has your arm, shoulder or hand problem interfered with your normal social activities with family, friends, neighbors, or groups? Not at all  During the past week, to what extent  has your arm, shoulder or hand problem limited your work or other regular daily activities Not at all    Arm, shoulder, or hand pain. Mild    Tingling (pins and needles) in your arm, shoulder, or hand Mild    Difficulty Sleeping Mild difficulty    DASH Score 13.64 %            Objective measurements completed on examination: See above findings.      Patient was instructed today in a home exercise program today for post op shoulder range of motion. These included active assist shoulder flexion in sitting, scapular retraction, wall walking with shoulder abduction, and hands behind head external rotation.  She was encouraged to do these twice a day, holding 3 seconds and repeating 5 times when permitted by her physician.             PT Education - 07/05/20 1415    Education Details Lymphedema risk reduction and post op shoulder ROM HEP    Person(s) Educated Patient;Child(ren)    Methods Explanation;Demonstration;Handout    Comprehension Returned demonstration;Verbalized understanding               PT Long Term Goals - 07/05/20 1449      PT LONG TERM GOAL #1   Title Patient will demonstrate she has regained full shoulder ROM and function post operatively compared to baseline.    Time 8    Period Weeks    Status New    Target Date 08/30/20           Breast Clinic Goals - 07/05/20 1449      Patient will be able to verbalize understanding of pertinent lymphedema risk reduction practices relevant to her diagnosis specifically related to skin care.   Time 1    Period Days    Status Achieved      Patient will be able to return demonstrate and/or verbalize understanding of the post-op home exercise program related to regaining shoulder range of motion.   Time 1    Period Days    Status Achieved      Patient will be able to verbalize understanding of the importance of attending the postoperative After Breast Cancer Class for further lymphedema risk reduction  education and therapeutic exercise.   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 There are 2 small areas measuring 8 mm and 7 mm close together in the upper outer quadrant. Ki67 is 80%. Her multidisciplinary medical team met prior to her assessments to determine a recommended treatment plan. She is planinng to have a left lumpectomy and sentinel node biopsy followed by possible chemotherapy and radiation. She will benefit from a post op PT reassessment to determine needs and from L-Dex screens every 3 months to detect subclinical lymphedema.    Stability/Clinical Decision Making Stable/Uncomplicated    Clinical Decision Making Low    Rehab Potential Excellent    PT Frequency --   Eval and 1 f/u visit   PT Treatment/Interventions ADLs/Self Care Home Management;Therapeutic exercise;Patient/family education    PT Next Visit Plan Will reassess 3-4 weeks post op to determine needs    PT Home Exercise Plan Post op  shoulder ROM HEP    Consulted and Agree with Plan of Care Patient;Family member/caregiver    Family Member Consulted daughter           Patient will benefit from skilled therapeutic intervention in order to improve the following deficits and impairments:  Postural dysfunction, Decreased range of motion, Impaired UE functional use, Pain, Decreased knowledge of precautions  Visit Diagnosis: Malignant neoplasm of upper-outer quadrant of left breast in female, estrogen receptor positive ([place]) - Plan: PT plan of care cert/re-cert  Abnormal posture - Plan: PT plan of care cert/re-cert   Patient will follow up at outpatient cancer rehab 3-4 weeks following surgery.  If the patient requires physical therapy at that time, a specific plan will be dictated and sent to the referring physician for approval. The  patient was educated today on appropriate basic range of motion exercises to begin post operatively and the importance of attending the After Breast Cancer class following surgery.  Patient was educated today on lymphedema risk reduction practices as it pertains to recommendations that will benefit the patient immediately following surgery.  She verbalized good understanding.      Problem List Patient Active Problem List   Diagnosis Date Noted   Malignant neoplasm of upper-outer quadrant of left breast in female, estrogen receptor negative ([place]) 06/30/2020   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/23/2019   Primary osteoarthritis of both hands 06/23/2019   Trigger thumb of left hand 06/23/2019   Polyarthralgia 06/23/2019   Hypertension 10/30/2018   Atypical chest pain 10/30/2018   Migraine 10/30/2018   Hypothyroidism 10/30/2018   Osteoporosis 10/30/2018   GERD (gastroesophageal reflux disease) 10/30/2018   Anxiety in acute stress reaction 10/30/2018   Allergic rhinitis 10/30/2018   Vitamin D deficiency 10/30/2018   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   Annia Friendly, PT 07/05/20 2:52 PM  [place] [address] [place], [place], [address] Phone: [phone]   Fax:  [phone]  Name: Cindy [name] MRN: [id number] Date of Birth: 02-12-1946

## 2020-07-05 NOTE — Assessment & Plan Note (Signed)
06/30/2020:Screening mammogram showed a possible left breast mass. Diagnostic mammogram showed two adjacent masses at the 1 o'clock position, 0.7cm and 0.8cm, about 0.5cm apart, and spanning in total 1.8cm, no left axillary adenopathy. Biopsy showed IDC with squamous differentiation and DCIS, grade 3, HER-2 negative (1+), ER/PR negative, Ki67 80%.  T1c N0 stage Ib  Pathology and radiology counseling: Discussed with the patient, the details of pathology including the type of breast cancer,the clinical staging, the significance of ER, PR and HER-2/neu receptors and the implications for treatment. After reviewing the pathology in detail, we proceeded to discuss the different treatment options between surgery, radiation, chemotherapy, antiestrogen therapies.  Treatment plan: 1.  Breast conserving surgery with sentinel lymph node biopsy 2. adjuvant chemotherapy 3.  Follow-up adjuvant radiation

## 2020-07-07 ENCOUNTER — Encounter: Payer: Self-pay | Admitting: Radiation Oncology

## 2020-07-11 ENCOUNTER — Ambulatory Visit: Payer: Self-pay | Admitting: Genetic Counselor

## 2020-07-11 ENCOUNTER — Encounter: Payer: Self-pay | Admitting: Genetic Counselor

## 2020-07-11 ENCOUNTER — Telehealth: Payer: Self-pay | Admitting: Genetic Counselor

## 2020-07-11 DIAGNOSIS — Z800 Family history of malignant neoplasm of digestive organs: Secondary | ICD-10-CM

## 2020-07-11 DIAGNOSIS — C50412 Malignant neoplasm of upper-outer quadrant of left female breast: Secondary | ICD-10-CM

## 2020-07-11 DIAGNOSIS — Z0000 Encounter for general adult medical examination without abnormal findings: Secondary | ICD-10-CM | POA: Insufficient documentation

## 2020-07-11 DIAGNOSIS — Z1379 Encounter for other screening for genetic and chromosomal anomalies: Secondary | ICD-10-CM

## 2020-07-11 DIAGNOSIS — Z803 Family history of malignant neoplasm of breast: Secondary | ICD-10-CM

## 2020-07-11 DIAGNOSIS — Z8042 Family history of malignant neoplasm of prostate: Secondary | ICD-10-CM

## 2020-07-11 DIAGNOSIS — Z171 Estrogen receptor negative status [ER-]: Secondary | ICD-10-CM

## 2020-07-11 HISTORY — DX: Encounter for other screening for genetic and chromosomal anomalies: Z13.79

## 2020-07-11 NOTE — Telephone Encounter (Signed)
Revealed negative genetic testing.  Discussed that we do not know why she has breast cancer or why there is cancer in the family. It could be due to a different gene that we are not testing, or maybe our current technology may not be able to pick something up.  It will be important for her to keep in contact with genetics to keep up with whether additional testing may be needed. 

## 2020-07-11 NOTE — Progress Notes (Signed)
HPI:  Ms. Sweitzer was previously seen in the [place] clinic due to a personal and family history of cancer and concerns regarding a hereditary predisposition to cancer. Please refer to our prior cancer genetics clinic note for more information regarding our discussion, assessment and recommendations, at the time. Ms. Galloway recent genetic test results were disclosed to her, as were recommendations warranted by these results. These results and recommendations are discussed in more detail below.  CANCER HISTORY:  Oncology History  Malignant neoplasm of upper-outer quadrant of left breast in female, estrogen receptor negative ([place])  06/30/2020 Initial Diagnosis   Screening mammogram showed a possible left breast mass. Diagnostic mammogram showed two adjacent masses at the 1 o'clock position, 0.7cm and 0.8cm, about 0.5cm apart, and spanning in total 1.8cm, no left axillary adenopathy. Biopsy showed IDC with squamous differentiation and DCIS, grade 3, HER-2 negative (1+), ER/PR negative, Ki67 80%.    07/05/2020 Cancer Staging   Staging form: Breast, AJCC 8th Edition - Clinical stage from 07/05/2020: Stage IB (cT1c, cN0, cM0, G3, ER-, PR-, HER2-) - Signed by Nicholas Lose, MD on 07/05/2020   07/11/2020 Genetic Testing   Negative genetic testing: no mutations detected in [facility] Common Hereditary Cancers Panel.  The report date is July 11, 2020.   The Common Hereditary Cancers Panel offered by [facility] includes sequencing and/or deletion duplication testing of the following 48 genes: APC, ATM, AXIN2, BARD1, BMPR1A, BRCA1, BRCA2, BRIP1, CDH1, CDK4, CDKN2A (p14ARF), CDKN2A (p16INK4a), CHEK2, CTNNA1, DICER1, EPCAM (Deletion/duplication testing only), GREM1 (promoter region deletion/duplication testing only), KIT, MEN1, MLH1, MSH2, MSH3, MSH6, MUTYH, NBN, NF1, NHTL1, PALB2, PDGFRA, PMS2, POLD1, POLE, PTEN, RAD50, RAD51C, RAD51D, RNF43, SDHB, SDHC, SDHD, SMAD4, SMARCA4. STK11, TP53,  TSC1, TSC2, and VHL.  The following genes were evaluated for sequence changes only: SDHA and HOXB13 c.251G>A variant only.     FAMILY HISTORY:  We obtained a detailed, 4-generation family history.  Significant diagnoses are listed below: Family History  Problem Relation Age of Onset  . Breast cancer Mother        dx before 20  . Prostate cancer Brother 80  . Breast cancer Maternal Aunt        dx 76s  . Breast cancer Maternal Aunt        dx 78s  . Breast cancer Maternal Grandmother        dx after 50  . Leukemia Paternal Grandfather        dx 2s-80s  . Pancreatic cancer Cousin        maternal; dx 69s  . Cancer Maternal Aunt 50       unknown type  . Breast cancer Cousin        paternal; dx 74s    Ms. Godette has one son, age 9, and one daughter, age 77.  Ms. Saunders had one brother who was diagnosed with prostate cancer at age 37 and passed away at age 53.  Ms. Kea mother was diagnosed with breast cancer before the age of 28 and passed away at age 52.  Ms. Poch had two maternal aunts diagnosed with breast cancer in their 59s and one maternal aunt with an unknown type of cancer diagnosed around 6.  Ms. Bodner had a maternal cousin diagnosed with pancreatic cancer at age 73.  Ms. Mcilhenny maternal grandmother was diagnosed with breast cancer after the age of 54 and passed away in his 70s-80s.  Ms. Grieves father passed away at age 65. Ms. Ragon had a  paternal cousin diagnosed with breast cancer in her 59s.  Ms. Gershon Cull paternal grandfather was diagnosed with leukemia in his 12s or 32s. No other paternal family history of cancer was reported.   Ms. Goto is unaware of previous family history of genetic testing for hereditary cancer risks. Patient's maternal ancestors are of English/Irish descent, and paternal ancestors are of English descent. There is no reported Ashkenazi Jewish ancestry. There is no known consanguinity.  GENETIC TEST RESULTS:  Genetic testing reported out on July 11, 2020.  The [facility] Common Hereditary Cancers Panel through [facility] found no pathogenic mutations. The Common Hereditary Cancers Panel offered by [facility] includes sequencing and/or deletion duplication testing of the following 48 genes: APC, ATM, AXIN2, BARD1, BMPR1A, BRCA1, BRCA2, BRIP1, CDH1, CDK4, CDKN2A (p14ARF), CDKN2A (p16INK4a), CHEK2, CTNNA1, DICER1, EPCAM (Deletion/duplication testing only), GREM1 (promoter region deletion/duplication testing only), KIT, MEN1, MLH1, MSH2, MSH3, MSH6, MUTYH, NBN, NF1, NHTL1, PALB2, PDGFRA, PMS2, POLD1, POLE, PTEN, RAD50, RAD51C, RAD51D, RNF43, SDHB, SDHC, SDHD, SMAD4, SMARCA4. STK11, TP53, TSC1, TSC2, and VHL.  The following genes were evaluated for sequence changes only: SDHA and HOXB13 c.251G>A variant only.  The test report has been scanned into EPIC and is located under the Molecular Pathology section of the Results Review tab.  A portion of the result report is included below for reference.     We discussed with Ms. Zabriskie that because current genetic testing is not perfect, it is possible there may be a gene mutation in one of these genes that current testing cannot detect, but that chance is small.  We also discussed, that there could be another gene that has not yet been discovered, or that we have not yet tested, that is responsible for the cancer diagnoses in the family. It is also possible there is a hereditary cause for the cancer in the family that Ms. Houchins did not inherit and therefore was not identified in her testing.  Therefore, it is important to remain in touch with cancer genetics in the future so that we can continue to offer Ms. Bolding the most up to date genetic testing.    ADDITIONAL GENETIC TESTING: We discussed with Ms. Copenhaver that there are other genes that are associated with increased cancer risk that can be analyzed. Should Ms. Tirone wish to pursue additional genetic testing,  we are happy to discuss and coordinate this testing, at any time.    CANCER SCREENING RECOMMENDATIONS: Ms. Helbig test result is considered negative (normal).  This means that we have not identified a hereditary cause for her personal and family history of cancer at this time. Most cancers happen by chance and this negative test suggests that her cancer may fall into this category.    Given Ms. Bochenek's personal and family histories, we must interpret these negative results with some caution.  Families with features suggestive of hereditary risk for cancer tend to have multiple family members with cancer, diagnoses in multiple generations and diagnoses before the age of 21. Ms. Elders family exhibits some of these features. Thus, this result may simply reflect our current inability to detect all mutations within these genes or there may be a different gene that has not yet been discovered or tested.   An individual's cancer risk and medical management are not determined by genetic test results alone. Overall cancer risk assessment incorporates additional factors, including personal medical history, family history, and any available genetic information that may result in a personalized plan for cancer prevention and surveillance.  RECOMMENDATIONS FOR FAMILY MEMBERS:  Individuals in this family might be at some increased risk of developing cancer, over the general population risk, simply due to the family history of cancer.  We recommended women in this family have a yearly mammogram beginning at age 3, or 68 years younger than the earliest onset of cancer, an annual clinical breast exam, and perform monthly breast self-exams. Women in this family should also have a gynecological exam as recommended by their primary provider. All family members should be referred for colonoscopy starting at age 30.  It is also possible there is a hereditary cause for the cancer in Ms. Koplin's family that  she did not inherit and therefore was not identified in her.  Based on Ms. Stitzer's family history of brother with metastatic prostate cancer before the age of 53, we recommended her nieces and nephews have genetic counseling and testing. Ms. Fredericks will let us know if we can be of any assistance in coordinating genetic counseling and/or testing for these family members.   FOLLOW-UP: Lastly, we discussed with Ms. Comes that cancer genetics is a rapidly advancing field and it is possible that new genetic tests will be appropriate for her and/or her family members in the future. We encouraged her to remain in contact with cancer genetics on an annual basis so we can update her personal and family histories and let her know of advances in cancer genetics that may benefit this family.   Our contact number was provided. Ms. Buster questions were answered to her satisfaction, and she knows she is welcome to call us at anytime with additional questions or concerns.   Derec Mozingo M. Joette Catching, [place], [facility] Certified Film/video [email or web address] (P) [phone]

## 2020-07-13 ENCOUNTER — Encounter: Payer: Self-pay | Admitting: *Deleted

## 2020-07-13 ENCOUNTER — Telehealth: Payer: Self-pay | Admitting: *Deleted

## 2020-07-13 ENCOUNTER — Other Ambulatory Visit: Payer: Self-pay | Admitting: General Surgery

## 2020-07-13 NOTE — Telephone Encounter (Signed)
Spoke with patient to follow up from [facility] last week and assess navigation needs.  No needs at this time.  Encouraged her to call should anything arise.

## 2020-07-17 ENCOUNTER — Encounter: Payer: Self-pay | Admitting: *Deleted

## 2020-07-18 ENCOUNTER — Telehealth: Payer: Self-pay | Admitting: Hematology and Oncology

## 2020-07-18 ENCOUNTER — Telehealth: Payer: Self-pay | Admitting: *Deleted

## 2020-07-18 ENCOUNTER — Other Ambulatory Visit: Payer: Self-pay | Admitting: General Surgery

## 2020-07-18 ENCOUNTER — Encounter: Payer: Self-pay | Admitting: *Deleted

## 2020-07-18 DIAGNOSIS — Z171 Estrogen receptor negative status [ER-]: Secondary | ICD-10-CM

## 2020-07-18 DIAGNOSIS — C50412 Malignant neoplasm of upper-outer quadrant of left female breast: Secondary | ICD-10-CM

## 2020-07-18 NOTE — Telephone Encounter (Signed)
Scheduled appt per 11/8 sch msg - mailed reminder with appt date and time

## 2020-07-18 NOTE — Telephone Encounter (Signed)
Pt called with questions concerning surgery date. Informed pt that I reached out to Dr. Barry Dienes and her office regarding surgery date and availability of moving her surgery to an earlier date. Informed pt that if surgery can be moved up, that she will hear directly for Dr. Marlowe Aschoff office with the date and instructions. Received verbal understanding.

## 2020-07-26 ENCOUNTER — Inpatient Hospital Stay: Payer: Medicare Other | Attending: Hematology and Oncology

## 2020-07-26 ENCOUNTER — Other Ambulatory Visit: Payer: Self-pay

## 2020-07-26 DIAGNOSIS — Z23 Encounter for immunization: Secondary | ICD-10-CM | POA: Diagnosis not present

## 2020-07-26 NOTE — Progress Notes (Signed)
Per [facility] RN    Covid-19 Vaccination Clinic  Name:  Cindy [name]    MRN: [id number] DOB: [birth year]  07/26/2020  [name] was observed post Covid-19 immunization for 15 minutes without incident. She was provided with Vaccine Information Sheet and instruction to access the V-Safe system.   [name] was instructed to call 911 with any severe reactions post vaccine: Marland Kitchen Difficulty breathing  . Swelling of face and throat  . A fast heartbeat  . A bad rash all over body  . Dizziness and weakness   Immunizations Administered    Name Date Dose VIS Date Route   Pfizer COVID-19 Vaccine 07/26/2020 10:28 AM 0.3 mL 06/28/2020 Intramuscular   Manufacturer: [place]   Lot: [id number]   NDC: [address]-1

## 2020-07-26 NOTE — Progress Notes (Signed)
Nutrition  Patient identified by attending Breast Clinic on 07/05/2020.  Patient was given nutrition packet with RD contact information by nurse navigator.  Chart reviewed.   74 year old female with breast cancer.  Planning lumpectomy on 12/7 followed by chemotherapy, radiation.    Ht: 64 inches Wt: 147 lb BMI: 25  Patient currently not at risk for malnutrition.  Please consult RD if changes in nutritional status occur.  Jassiel Flye B. Zenia Resides, [place], [place] Registered Dietitian [phone] (mobile)

## 2020-08-07 NOTE — Pre-Procedure Instructions (Signed)
Cindy [name]  08/07/2020     Your procedure is scheduled on Tuesday, December 7.  Report to [facility], Main Entrance or Entrance "A" at 7:00 AM                 Your surgery or procedure is scheduled to begin at 9:00 AM   Call this number if you have problems the morning of surgery: [phone]  This is the number for the Pre- Surgical Desk.                For any other questions, please call [phone], Monday - Friday 8 AM - 4 PM.   Remember:  Do not eat after midnight.  You may drink clear liquids until 6:00 AM.  Clear liquids allowed are:                  Water, Juice (non-citric and without pulp - diabetics please choose diet or no sugar options), Carbonated beverages - (diabetics please choose diet or no sugar options), Clear Tea, Black Coffee only (no creamer, milk or cream including half and half), Plain Jell-O only (diabetics please choose diet or no sugar options), Gatorade (diabetics please choose diet or no sugar options) and Plain Popsicles only    Take these medicines the morning of surgery with A SIP OF WATER : escitalopram (LEXAPRO) esomeprazole (NEXIUM) levothyroxine (SYNTHROID)  NIFEdipine (PROCARDIA-XL/NIFEDICAL-XL) 30  rosuvastatin (CRESTOR)   Take if needed: cetirizine (ZYRTEC)  cycloSPORINE (RESTASIS) eye drops nitroGLYCERIN (NITROSTAT)    1 Week prior to surgery STOP taking Aspirin, Aspirin Products (Goody Powder, Excedrin Migraine), Ibuprofen (Advil), Naproxen (Aleve),diclofenac sodium (VOLTAREN) gel Vitamins and Herbal Products (ie Fish Oil).    Special instructions:    [place]- Preparing For Surgery  Before surgery, you can play an important role. Because skin is not sterile, your skin needs to be as free of germs as possible. You can reduce the number of germs on your skin by washing with CHG (chlorahexidine gluconate) Soap before surgery.  CHG is an antiseptic cleaner which kills germs and bonds with the skin to continue  killing germs even after washing.    Oral Hygiene is also important to reduce your risk of infection.  Remember - BRUSH YOUR TEETH THE MORNING OF SURGERY WITH YOUR REGULAR TOOTHPASTE  Please do not use if you have an allergy to CHG or antibacterial soaps. If your skin becomes reddened/irritated stop using the CHG.  Do not shave (including legs and underarms) for at least 48 hours prior to first CHG shower. It is OK to shave your face.  Please follow these instructions carefully.   1. Shower the NIGHT BEFORE SURGERY and the MORNING OF SURGERY with CHG.   2. If you chose to wash your hair, wash your hair first as usual with your normal shampoo.  3. After you shampoo, wash your face and private area with the soap you use at home, then rinse your hair and body thoroughly to remove the shampoo and soap.  4. Use CHG as you would any other liquid soap. You can apply CHG directly to the skin and wash gently with a scrungie or a clean washcloth.   5. Apply the CHG Soap to your body ONLY FROM THE NECK DOWN.  Do not use on open wounds or open sores. Avoid contact with your eyes, ears, mouth and genitals (private parts).   6. Wash thoroughly, paying special attention to the area where your surgery will  be performed.  7. Thoroughly rinse your body with warm water from the neck down.  8. DO NOT shower/wash with your normal soap after using and rinsing off the CHG Soap.  9. Pat yourself dry with a CLEAN TOWEL.  10. Wear CLEAN PAJAMAS to bed the night before surgery, wear comfortable clothes the morning of surgery  11. Place CLEAN SHEETS on your bed the night of your first shower and DO NOT SLEEP WITH PETS.  Day of Surgery: Shower as instructed above. Do not apply any deodorants/lotions, powders or colognes.  Please wear clean clothes to the hospital/surgery center.   Remember to brush your teeth WITH YOUR REGULAR TOOTHPASTE.  Do not wear jewelry, make-up or nail polish.  Do not shave 48 hours  prior to surgery.  Men may shave face and neck.  Do not bring valuables to the hospital.  [facility] is not responsible for any belongings or valuables.  Contacts, dentures or bridgework may not be worn into surgery.  Leave your suitcase in the car.  After surgery it may be brought to your room.  For patients admitted to the hospital, discharge time will be determined by your treatment team.  Patients discharged the day of surgery will not be allowed to drive home.   Please read over the fact sheets that you were given.         Marland Kitchen

## 2020-08-08 ENCOUNTER — Encounter (HOSPITAL_COMMUNITY): Payer: Self-pay

## 2020-08-08 ENCOUNTER — Encounter (HOSPITAL_COMMUNITY)
Admission: RE | Admit: 2020-08-08 | Discharge: 2020-08-08 | Disposition: A | Discharge status: Home / Self Care | Payer: Medicare Other | Source: Ambulatory Visit | Attending: General Surgery | Admitting: General Surgery

## 2020-08-08 ENCOUNTER — Other Ambulatory Visit: Payer: Self-pay

## 2020-08-08 DIAGNOSIS — R0789 Other chest pain: Secondary | ICD-10-CM | POA: Diagnosis not present

## 2020-08-08 DIAGNOSIS — Z01812 Encounter for preprocedural laboratory examination: Secondary | ICD-10-CM | POA: Diagnosis not present

## 2020-08-08 HISTORY — DX: Chest pain, unspecified: R07.9

## 2020-08-08 HISTORY — DX: Personal history of urinary calculi: Z87.442

## 2020-08-08 HISTORY — DX: Unspecified osteoarthritis, unspecified site: M19.90

## 2020-08-08 HISTORY — DX: Pneumonia, unspecified organism: J18.9

## 2020-08-08 HISTORY — DX: Malignant (primary) neoplasm, unspecified: C80.1

## 2020-08-08 LAB — CBC
HCT: 44.6 % (ref 36.0–46.0)
Hemoglobin: 14.2 g/dL (ref 12.0–15.0)
MCH: 31 pg (ref 26.0–34.0)
MCHC: 31.8 g/dL (ref 30.0–36.0)
MCV: 97.4 fL (ref 80.0–100.0)
Platelets: 200 10*3/uL (ref 150–400)
RBC: 4.58 MIL/uL (ref 3.87–5.11)
RDW: 12.7 % (ref 11.5–15.5)
WBC: 6.9 10*3/uL (ref 4.0–10.5)
nRBC: 0 % (ref 0.0–0.2)

## 2020-08-08 LAB — BASIC METABOLIC PANEL
Anion gap: 9 (ref 5–15)
BUN: 16 mg/dL (ref 8–23)
CO2: 23 mmol/L (ref 22–32)
Calcium: 8.6 mg/dL — ABNORMAL LOW (ref 8.9–10.3)
Chloride: 106 mmol/L (ref 98–111)
Creatinine, Ser: 0.85 mg/dL (ref 0.44–1.00)
GFR, Estimated: >60 mL/min (ref >60)
Glucose, Bld: 89 mg/dL (ref 70–99)
Potassium: 3.8 mmol/L (ref 3.5–5.1)
Sodium: 138 mmol/L (ref 135–145)

## 2020-08-08 NOTE — Progress Notes (Signed)
PCP - Dr. Sheppard Coil  Cardiologist - Dr. Heron Sabins  Chest x-ray - NA  EKG - 05/10/20  Stress Test - no  ECHO - no  Cardiac Cath - no  Sleep Study - no  CPAP - no  LABS-CBC, BMP  ASA-no  ERAS-Yes- clear liquids until 0600  HA1C-na Fasting Blood Sugar - na Checks Blood Sugar ___0__ times a day   Anesthesia- Ms Fewell has a history of chest pain, patient saw Dr.Chenshaw 05/10/20- Dr. Stanford Breed ordered a Cardiac Ct Scan. It did not show a cardiac reason for chest pain. Mrs. Englander reported that it may be caused by anxiety, Lexapro 20 mg was ordered, patient takes 5 mg, "I know that is what I need."  Mrs Mainer still has discomfort at times.  Pt denies having chest pain, sob, or fever at this time. All instructions explained to the pt, with a verbal understanding of the material. Pt agrees to go over the instructions while at home for a better understanding. Pt also instructed to self quarantine after being tested for COVID-19. The opportunity to ask questions was provided.

## 2020-08-09 NOTE — Progress Notes (Signed)
Anesthesia Chart Review:  Patient recently seen by cardiologist Dr. Stanford Breed for evaluation of longstanding atypical chest pain.  Per Dr. Jacalyn Lefevre note 05/10/2020, "Patient previously resided in [place] and moved here in 2019 to be close to family.  She has had occasional chest pain intermittently for approximately 20 years.  It begins in the right temporal area and radiates to the right shoulder and to the substernal area.  It is described as a squeezing pain.  No associated nausea, dyspnea or diaphoresis.  Typically last 5 to 15 minutes and resolve spontaneously or with sublingual nitroglycerin.  It is not exertional or related to food.  She also has some dyspnea on exertion but no orthopnea, PND, exertional chest pain or syncope.  Occasional minimal pedal edema."  Cardiac CTA was ordered to rule out coronary disease.  She is recommended continue with sublingual nitroglycerin as needed.  Coronary CT 05/24/2020 showed no evidence of CAD, coronary calcium score of 0.  Recommended consideration of other noncardiac causes of chest pain.  Preop labs reviewed, unremarkable.  EKG 05/10/2020: sinus rhythm at a rate of 90, normal axis, short PR interval, nonspecific ST changes.    Coronary CT 05/24/2020: IMPRESSION: 1. No evidence of CAD, CADRADS = 0.  2. Coronary calcium score of 0. This was 0 percentile for age and sex matched control.  3. Normal coronary origin with left dominance.  4. Spotty aortic atherosclerosis.  5. Consider other non-cardiac causes of chest pain.   Wynonia Musty [facility] [address] Short Stay Center/Anesthesiology Phone [phone] 08/09/2020 4:08 PM

## 2020-08-09 NOTE — Anesthesia Preprocedure Evaluation (Addendum)
Anesthesia Evaluation  Patient identified by MRN, date of birth, ID band Patient awake    Reviewed: Allergy & Precautions, NPO status , Patient's Chart, lab work & pertinent test results  History of Anesthesia Complications Negative for: history of anesthetic complications  Airway Mallampati: II  TM Distance: >3 FB Neck ROM: Full    Dental  (+) Dental Advisory Given, Teeth Intact   Pulmonary neg pulmonary ROS,    Pulmonary exam normal        Cardiovascular hypertension, Pt. on medications Normal cardiovascular exam     Neuro/Psych  Headaches, PSYCHIATRIC DISORDERS Anxiety    GI/Hepatic Neg liver ROS, GERD  Medicated and Controlled,  Endo/Other  Hypothyroidism   Renal/GU negative Renal ROS     Musculoskeletal  (+) Arthritis ,   Abdominal   Peds  Hematology negative hematology ROS (+)   Anesthesia Other Findings Covid test negative See PAT note   Reproductive/Obstetrics  Breast cancer                            Anesthesia Physical Anesthesia Plan  ASA: III  Anesthesia Plan: General   Post-op Pain Management:  Regional for Post-op pain   Induction: Intravenous  PONV Risk Score and Plan: 3 and Treatment may vary due to age or medical condition, Ondansetron and Propofol infusion  Airway Management Planned: LMA  Additional Equipment: None  Intra-op Plan:   Post-operative Plan: Extubation in OR  Informed Consent: I have reviewed the patients History and Physical, chart, labs and discussed the procedure including the risks, benefits and alternatives for the proposed anesthesia with the patient or authorized representative who has indicated his/her understanding and acceptance.     Dental advisory given  Plan Discussed with: CRNA and Anesthesiologist  Anesthesia Plan Comments:       Anesthesia Quick Evaluation

## 2020-08-11 ENCOUNTER — Other Ambulatory Visit: Payer: Self-pay | Admitting: Osteopathic Medicine

## 2020-08-11 ENCOUNTER — Other Ambulatory Visit (HOSPITAL_COMMUNITY)
Admission: RE | Admit: 2020-08-11 | Discharge: 2020-08-11 | Disposition: A | Discharge status: Home / Self Care | Payer: Medicare Other | Source: Ambulatory Visit | Attending: General Surgery | Admitting: General Surgery

## 2020-08-11 DIAGNOSIS — Z20822 Contact with and (suspected) exposure to covid-19: Secondary | ICD-10-CM | POA: Diagnosis not present

## 2020-08-11 DIAGNOSIS — Z01812 Encounter for preprocedural laboratory examination: Secondary | ICD-10-CM | POA: Insufficient documentation

## 2020-08-11 LAB — SARS CORONAVIRUS 2 (TAT 6-24 HRS): SARS Coronavirus 2: NEGATIVE

## 2020-08-14 ENCOUNTER — Other Ambulatory Visit: Payer: Self-pay

## 2020-08-14 ENCOUNTER — Ambulatory Visit
Admission: RE | Admit: 2020-08-14 | Discharge: 2020-08-14 | Disposition: A | Discharge status: Home / Self Care | Payer: Medicare Other | Source: Ambulatory Visit | Attending: General Surgery | Admitting: General Surgery

## 2020-08-14 DIAGNOSIS — C50412 Malignant neoplasm of upper-outer quadrant of left female breast: Secondary | ICD-10-CM

## 2020-08-14 DIAGNOSIS — C50912 Malignant neoplasm of unspecified site of left female breast: Secondary | ICD-10-CM | POA: Diagnosis not present

## 2020-08-14 NOTE — H&P (Signed)
[name] Location: [facility] Surgery Patient #: [id number] DOB: 26-Oct-1945 Undefined / Language: Cindy [name] / Race: White Female   History of Present Illness  The patient is a 74 year old female who presents with breast cancer. Pt is a lovely 74 yo F diagnosed with left breast cancer 06/2020. She presented with a screening detected left breast mass. Diagnostic imaging showed two masses at 1 o'clock, with one being 8 mm and one 7 mm. The total was 1.8 cm and clips were 1.6 cm apart. These were biopsied and were similar. 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   She had menarche at age 2, menopause age <89, She is a G3P2 with first child in the late 41s. She has some OCP use. She has a family history of a mom and two maternal aunts with breast cancer. Her brother had prostate cancer.   dx mammogrma/us 06/05/2020 ACR Breast Density Category b: There are scattered areas of fibroglandular density.  FINDINGS: 3D tomographic and 2D generated spot compression views of the left breast confirm 2 adjacent irregular masses in the posterior upper outer quadrant of the left breast, superficially.  On physical exam, no masses are palpable in the upper outer left breast. There are no palpable left axillary lymph nodes.  Targeted ultrasound is performed, showing 2 nearby irregular, predominantly hypoechoic masses in the 1 o'clock position of the left breast, 8 cm from the nipple. These are located 5 mm apart and span 1.8 cm. One mass measures 7 x 6 x 5 x 9 mm and the other measures 8 x 6 x 5 mm. At real time, these had ultrasound features suggesting clusters of cysts.  Ultrasound of the left axilla demonstrated normal appearing left axillary lymph nodes.  IMPRESSION: Interval development of 2 nearby 8 mm and 7 mm indeterminate masses 1 o'clock position of the left breast.  RECOMMENDATION: Ultrasound-guided core  needle biopsy of the 8 mm and 7 mm masses in the 1 o'clock position of the left breast. This has been discussed with the patient and scheduled at 12:45 p.m. on 06/26/2020 to accommodate the patient's schedule.  I have discussed the findings and recommendations with the patient. If applicable, a reminder letter will be sent to the patient regarding the next appointment.  BI-RADS CATEGORY 4: Suspicious.  pathology 06/26/2020 1. Breast, left, needle core biopsy, 1 o'clock - INVASIVE DUCTAL CARCINOMA WITH SQUAMOUS DIFFERENTIATION, SEE COMMENT. - DUCTAL CARCINOMA IN SITU.  2. Breast, left, needle core biopsy, 1 o'clock - INVASIVE DUCTAL CARCINOMA WITH SQUAMOUS DIFFERENTIATION, SEE COMMENT. - DUCTAL CARCINOMA IN SITU.  The tumor cells are NEGATIVE for Her2 (1+). Estrogen Receptor: 0%, NEGATIVE Progesterone Receptor: 0%, NEGATIVE Proliferation Marker Ki67: 80% Both parts are morphologically similar. The carcinoma is grade 3 with focal squamous differentiation, consistent with metaplastic features  07/05/2020 CMET and CBC are essentially normal.   Past Surgical History  Hysterectomy (due to cancer) - Partial  Tonsillectomy   Diagnostic Studies History  Colonoscopy  1-5 years ago Mammogram  within last year Pap Smear  1-5 years ago  Medication History Medications Reconciled  Social History Caffeine use  Carbonated beverages, Tea. No alcohol use  No drug use  Tobacco use  Never smoker.  Family History  Breast Cancer  Family Members In General, Mother. Prostate Cancer  Brother. Respiratory Condition  Family Members In General.  Pregnancy / Birth History Age at menarche  44 years. Age of  menopause  <43 Contraceptive History  Oral contraceptives. Gravida  3 Irregular periods  Maternal age  46-30 Para  2  Other Problems  Arthritis  Chest pain  Gastroesophageal Reflux Disease  Hemorrhoids  High blood pressure  Kidney Stone  Migraine  Headache  Thyroid Disease     Review of Systems General Not Present- Appetite Loss, Chills, Fatigue, Fever, Night Sweats, Weight Gain and Weight Loss. Skin Not Present- Change in Wart/Mole, Dryness, Hives, Jaundice, New Lesions, Non-Healing Wounds, Rash and Ulcer. HEENT Present- Earache, Seasonal Allergies and Wears glasses/contact lenses. Not Present- Hearing Loss, Hoarseness, Nose Bleed, Oral Ulcers, Ringing in the Ears, Sinus Pain, Sore Throat, Visual Disturbances and Yellow Eyes. Respiratory Not Present- Bloody sputum, Chronic Cough, Difficulty Breathing, Snoring and Wheezing. Breast Not Present- Breast Mass, Breast Pain, Nipple Discharge and Skin Changes. Cardiovascular Present- Chest Pain. Not Present- Difficulty Breathing Lying Down, Leg Cramps, Palpitations, Rapid Heart Rate, Shortness of Breath and Swelling of Extremities. Gastrointestinal Present- Hemorrhoids. Not Present- Abdominal Pain, Bloating, Bloody Stool, Change in Bowel Habits, Chronic diarrhea, Constipation, Difficulty Swallowing, Excessive gas, Gets full quickly at meals, Indigestion, Nausea, Rectal Pain and Vomiting. Female Genitourinary Not Present- Frequency, Nocturia, Painful Urination, Pelvic Pain and Urgency. Musculoskeletal Present- Back Pain. Not Present- Joint Pain, Joint Stiffness, Muscle Pain, Muscle Weakness and Swelling of Extremities. Neurological Present- Headaches. Not Present- Decreased Memory, Fainting, Numbness, Seizures, Tingling, Tremor, Trouble walking and Weakness. Hematology Not Present- Blood Thinners, Easy Bruising, Excessive bleeding, Gland problems, HIV and Persistent Infections.  Vitals Weight: 147.2 lb Height: 64in Body Surface Area: 1.72 m Body Mass Index: 25.27 kg/m  Temp.: 73F  Pulse: 78 (Regular)  Resp.: 18 (Unlabored)  BP: 121/66(Sitting, Left Arm, Standard)       Physical Exam  General Mental Status-Alert. General Appearance-Consistent with stated  age. Hydration-Well hydrated. Voice-Normal.  Head and Neck Head-normocephalic, atraumatic with no lesions or palpable masses. Trachea-midline. Thyroid Gland Characteristics - normal size and consistency.  Eye Eyeball - Bilateral-Extraocular movements intact. Sclera/Conjunctiva - Bilateral-No scleral icterus.  Chest and Lung Exam Chest and lung exam reveals -quiet, even and easy respiratory effort with no use of accessory muscles and on auscultation, normal breath sounds, no adventitious sounds and normal vocal resonance. Inspection Chest Wall - Normal. Back - normal.  Breast Note: relatively symmetric in size. no palpable masses. some bruising UOQ left breast. no nipple retraction or skin dimpling. no nipple discharge. no LAD. right breast benign.   Cardiovascular Cardiovascular examination reveals -normal heart sounds, regular rate and rhythm with no murmurs and normal pedal pulses bilaterally.  Abdomen Inspection Inspection of the abdomen reveals - No Hernias. Palpation/Percussion Palpation and Percussion of the abdomen reveal - Soft, Non Tender, No Rebound tenderness, No Rigidity (guarding) and No hepatosplenomegaly. Auscultation Auscultation of the abdomen reveals - Bowel sounds normal.  Neurologic Neurologic evaluation reveals -alert and oriented x 3 with no impairment of recent or remote memory. Mental Status-Normal.  Musculoskeletal Global Assessment -Note: no gross deformities.  Normal Exam - Left-Upper Extremity Strength Normal and Lower Extremity Strength Normal. Normal Exam - Right-Upper Extremity Strength Normal and Lower Extremity Strength Normal.  Lymphatic Head & Neck  General Head & Neck Lymphatics: Bilateral - Description - Normal. Axillary  General Axillary Region: Bilateral - Description - Normal. Tenderness - Non Tender. Femoral & Inguinal  Generalized Femoral & Inguinal Lymphatics: Bilateral - Description - No  Generalized lymphadenopathy.    Assessment & Plan  MALIGNANT NEOPLASM OF UPPER-OUTER QUADRANT OF LEFT BREAST IN FEMALE, ESTROGEN RECEPTOR  NEGATIVE (C50.412) Impression: Pt with a new dx of cT1bN0 left breast cancer, triple negative. Will plan seed localized lumpectomy with sentinel lymph node biopsy and port placement. This will be followed by adjuvant radiation and chemotherapy.  The surgical procedure was described to the patient. I discussed the incision type and location and that we would need radiology involved on with a wire or seed marker and/or sentinel node.  The risks and benefits of the procedure were described to the patient and she wishes to proceed.  We discussed the risks bleeding, infection, damage to other structures, need for further procedures/surgeries. We discussed the risk of seroma. The patient was advised if the area in the breast in cancer, we may need to go back to surgery for additional tissue to obtain negative margins or for a lymph node biopsy. The patient was advised that these are the most common complications, but that others can occur as well. They were advised against taking aspirin or other anti-inflammatory agents/blood thinners the week before surgery. FAMILY HISTORY OF BREAST CANCER (Z80.3) Impression: refer to genetics. CHEST PAIN (R07.9) Impression: Has had significant workup with Dr. Stanford Breed which was negative for ischemic disease.  Signed by Stark Klein, MD

## 2020-08-15 ENCOUNTER — Ambulatory Visit (HOSPITAL_COMMUNITY)
Admission: RE | Admit: 2020-08-15 | Discharge: 2020-08-15 | Disposition: A | Discharge status: Home / Self Care | Payer: Medicare Other | Attending: General Surgery | Admitting: General Surgery

## 2020-08-15 ENCOUNTER — Other Ambulatory Visit: Payer: Self-pay

## 2020-08-15 ENCOUNTER — Encounter (HOSPITAL_COMMUNITY): Payer: Self-pay | Admitting: General Surgery

## 2020-08-15 ENCOUNTER — Encounter (HOSPITAL_COMMUNITY)
Admission: RE | Disposition: A | Discharge status: Home / Self Care | Payer: Self-pay | Source: Home / Self Care | Attending: General Surgery

## 2020-08-15 ENCOUNTER — Ambulatory Visit (HOSPITAL_COMMUNITY): Payer: Medicare Other | Admitting: Physician Assistant

## 2020-08-15 ENCOUNTER — Encounter (HOSPITAL_COMMUNITY)
Admission: RE | Admit: 2020-08-15 | Discharge: 2020-08-15 | Disposition: A | Discharge status: Home / Self Care | Payer: Medicare Other | Source: Ambulatory Visit | Attending: General Surgery | Admitting: General Surgery

## 2020-08-15 ENCOUNTER — Ambulatory Visit (HOSPITAL_COMMUNITY): Payer: Medicare Other

## 2020-08-15 ENCOUNTER — Ambulatory Visit
Admission: RE | Admit: 2020-08-15 | Discharge: 2020-08-15 | Disposition: A | Discharge status: Home / Self Care | Payer: Medicare Other | Source: Ambulatory Visit | Attending: General Surgery | Admitting: General Surgery

## 2020-08-15 ENCOUNTER — Ambulatory Visit (HOSPITAL_COMMUNITY): Payer: Medicare Other | Admitting: Certified Registered Nurse Anesthetist

## 2020-08-15 DIAGNOSIS — G8918 Other acute postprocedural pain: Secondary | ICD-10-CM | POA: Diagnosis not present

## 2020-08-15 DIAGNOSIS — C50412 Malignant neoplasm of upper-outer quadrant of left female breast: Secondary | ICD-10-CM | POA: Insufficient documentation

## 2020-08-15 DIAGNOSIS — C50912 Malignant neoplasm of unspecified site of left female breast: Secondary | ICD-10-CM | POA: Diagnosis not present

## 2020-08-15 DIAGNOSIS — C50411 Malignant neoplasm of upper-outer quadrant of right female breast: Secondary | ICD-10-CM | POA: Diagnosis not present

## 2020-08-15 DIAGNOSIS — Z8042 Family history of malignant neoplasm of prostate: Secondary | ICD-10-CM | POA: Diagnosis not present

## 2020-08-15 DIAGNOSIS — Z171 Estrogen receptor negative status [ER-]: Secondary | ICD-10-CM

## 2020-08-15 DIAGNOSIS — Z803 Family history of malignant neoplasm of breast: Secondary | ICD-10-CM | POA: Insufficient documentation

## 2020-08-15 DIAGNOSIS — Z419 Encounter for procedure for purposes other than remedying health state, unspecified: Secondary | ICD-10-CM

## 2020-08-15 DIAGNOSIS — E559 Vitamin D deficiency, unspecified: Secondary | ICD-10-CM | POA: Diagnosis not present

## 2020-08-15 HISTORY — PX: BREAST LUMPECTOMY WITH RADIOACTIVE SEED AND SENTINEL LYMPH NODE BIOPSY: SHX6550

## 2020-08-15 HISTORY — PX: BREAST LUMPECTOMY: SHX2

## 2020-08-15 HISTORY — PX: PORTACATH PLACEMENT: SHX2246

## 2020-08-15 SURGERY — BREAST LUMPECTOMY WITH RADIOACTIVE SEED AND SENTINEL LYMPH NODE BIOPSY
Anesthesia: General | Site: Chest | Laterality: Right

## 2020-08-15 MED ORDER — EPHEDRINE 5 MG/ML INJ
INTRAVENOUS | Status: AC
  Filled 2020-08-15: qty 10

## 2020-08-15 MED ORDER — LIDOCAINE-EPINEPHRINE 1 %-1:100000 IJ SOLN
INTRAMUSCULAR | Status: AC
  Filled 2020-08-15: qty 2

## 2020-08-15 MED ORDER — OXYCODONE HCL 5 MG/5ML PO SOLN: 5.0000 mg | Freq: Once | ORAL | Status: DC | PRN

## 2020-08-15 MED ORDER — 0.9 % SODIUM CHLORIDE (POUR BTL) OPTIME
TOPICAL | Status: DC | PRN
  Administered 2020-08-15: 1000 mL

## 2020-08-15 MED ORDER — SODIUM CHLORIDE (PF) 0.9 % IJ SOLN
INTRAMUSCULAR | Status: AC
  Filled 2020-08-15: qty 10

## 2020-08-15 MED ORDER — BUPIVACAINE LIPOSOME 1.3 % IJ SUSP
INTRAMUSCULAR | Status: DC | PRN
  Administered 2020-08-15: 10 mL

## 2020-08-15 MED ORDER — CHLORHEXIDINE GLUCONATE CLOTH 2 % EX PADS: 6.0000 | MEDICATED_PAD | Freq: Once | CUTANEOUS | Status: DC

## 2020-08-15 MED ORDER — ONDANSETRON HCL 4 MG/2ML IJ SOLN
INTRAMUSCULAR | Status: AC
  Filled 2020-08-15: qty 2

## 2020-08-15 MED ORDER — PROPOFOL 10 MG/ML IV BOLUS
INTRAVENOUS | Status: DC | PRN
  Administered 2020-08-15: 120 mg via INTRAVENOUS

## 2020-08-15 MED ORDER — SODIUM CHLORIDE 0.9 % IV SOLN: INTRAVENOUS | Status: DC | PRN

## 2020-08-15 MED ORDER — OXYCODONE HCL 5 MG PO TABS: 5.0000 mg | ORAL_TABLET | Freq: Once | ORAL | Status: DC | PRN

## 2020-08-15 MED ORDER — CEFAZOLIN SODIUM-DEXTROSE 2-4 GM/100ML-% IV SOLN
2.0000 g | INTRAVENOUS | Status: AC
  Administered 2020-08-15: 2 g via INTRAVENOUS
  Filled 2020-08-15: qty 100

## 2020-08-15 MED ORDER — METHYLENE BLUE 0.5 % INJ SOLN
INTRAVENOUS | Status: AC
  Filled 2020-08-15: qty 10

## 2020-08-15 MED ORDER — FENTANYL CITRATE (PF) 100 MCG/2ML IJ SOLN: 50.0000 ug | Freq: Once | INTRAMUSCULAR | Status: AC

## 2020-08-15 MED ORDER — DEXAMETHASONE SODIUM PHOSPHATE 10 MG/ML IJ SOLN
INTRAMUSCULAR | Status: AC
  Filled 2020-08-15: qty 1

## 2020-08-15 MED ORDER — ORAL CARE MOUTH RINSE: 15.0000 mL | Freq: Once | OROMUCOSAL | Status: AC

## 2020-08-15 MED ORDER — LACTATED RINGERS IV SOLN: INTRAVENOUS | Status: DC

## 2020-08-15 MED ORDER — EPHEDRINE SULFATE-NACL 50-0.9 MG/10ML-% IV SOSY
PREFILLED_SYRINGE | INTRAVENOUS | Status: DC | PRN
  Administered 2020-08-15: 10 mg via INTRAVENOUS
  Administered 2020-08-15: 5 mg via INTRAVENOUS
  Administered 2020-08-15: 10 mg via INTRAVENOUS

## 2020-08-15 MED ORDER — ONDANSETRON HCL 4 MG/2ML IJ SOLN: 4.0000 mg | Freq: Once | INTRAMUSCULAR | Status: DC | PRN

## 2020-08-15 MED ORDER — PHENYLEPHRINE HCL-NACL 10-0.9 MG/250ML-% IV SOLN
INTRAVENOUS | Status: DC | PRN
  Administered 2020-08-15: 50 ug/min via INTRAVENOUS

## 2020-08-15 MED ORDER — ACETAMINOPHEN 500 MG PO TABS
1000.0000 mg | ORAL_TABLET | ORAL | Status: AC
  Administered 2020-08-15: 1000 mg via ORAL
  Filled 2020-08-15: qty 2

## 2020-08-15 MED ORDER — LIDOCAINE-EPINEPHRINE 1 %-1:100000 IJ SOLN
INTRAMUSCULAR | Status: DC | PRN
  Administered 2020-08-15: 10 mL

## 2020-08-15 MED ORDER — FENTANYL CITRATE (PF) 100 MCG/2ML IJ SOLN
INTRAMUSCULAR | Status: AC
  Administered 2020-08-15: 50 ug via INTRAVENOUS
  Filled 2020-08-15: qty 2

## 2020-08-15 MED ORDER — CHLORHEXIDINE GLUCONATE 0.12 % MT SOLN
15.0000 mL | Freq: Once | OROMUCOSAL | Status: AC
  Administered 2020-08-15: 15 mL via OROMUCOSAL
  Filled 2020-08-15: qty 15

## 2020-08-15 MED ORDER — MIDAZOLAM HCL 2 MG/2ML IJ SOLN: 1.0000 mg | Freq: Once | INTRAMUSCULAR | Status: AC

## 2020-08-15 MED ORDER — DEXAMETHASONE SODIUM PHOSPHATE 4 MG/ML IJ SOLN
INTRAMUSCULAR | Status: DC | PRN
  Administered 2020-08-15: 5 mg via INTRAVENOUS

## 2020-08-15 MED ORDER — TECHNETIUM TC 99M TILMANOCEPT KIT
1.0000 | PACK | Freq: Once | INTRAVENOUS | Status: AC | PRN
  Administered 2020-08-15: 1 via INTRADERMAL

## 2020-08-15 MED ORDER — BUPIVACAINE HCL (PF) 0.5 % IJ SOLN
INTRAMUSCULAR | Status: DC | PRN
  Administered 2020-08-15: 15 mL

## 2020-08-15 MED ORDER — ONDANSETRON HCL 4 MG/2ML IJ SOLN
INTRAMUSCULAR | Status: DC | PRN
  Administered 2020-08-15: 4 mg via INTRAVENOUS

## 2020-08-15 MED ORDER — HEPARIN SOD (PORK) LOCK FLUSH 100 UNIT/ML IV SOLN
INTRAVENOUS | Status: AC
  Filled 2020-08-15: qty 5

## 2020-08-15 MED ORDER — OXYCODONE HCL 5 MG PO TABS: 2.5000 mg | ORAL_TABLET | Freq: Four times a day (QID) | ORAL | 0 refills | Status: DC | PRN

## 2020-08-15 MED ORDER — FENTANYL CITRATE (PF) 100 MCG/2ML IJ SOLN: 25.0000 ug | INTRAMUSCULAR | Status: DC | PRN

## 2020-08-15 MED ORDER — BUPIVACAINE HCL (PF) 0.25 % IJ SOLN
INTRAMUSCULAR | Status: AC
  Filled 2020-08-15: qty 60

## 2020-08-15 MED ORDER — HEPARIN SOD (PORK) LOCK FLUSH 100 UNIT/ML IV SOLN
INTRAVENOUS | Status: DC | PRN
  Administered 2020-08-15: 500 [IU] via INTRAVENOUS

## 2020-08-15 MED ORDER — LIDOCAINE HCL (PF) 2 % IJ SOLN
INTRAMUSCULAR | Status: AC
  Filled 2020-08-15: qty 5

## 2020-08-15 MED ORDER — BUPIVACAINE HCL (PF) 0.25 % IJ SOLN
INTRAMUSCULAR | Status: DC | PRN
  Administered 2020-08-15: 10 mL

## 2020-08-15 MED ORDER — PROPOFOL 10 MG/ML IV BOLUS
INTRAVENOUS | Status: AC
  Filled 2020-08-15: qty 20

## 2020-08-15 MED ORDER — SODIUM CHLORIDE 0.9 % IV SOLN
INTRAVENOUS | Status: AC
  Filled 2020-08-15: qty 1.2

## 2020-08-15 MED ORDER — MIDAZOLAM HCL 2 MG/2ML IJ SOLN
INTRAMUSCULAR | Status: AC
  Administered 2020-08-15: 1 mg via INTRAVENOUS
  Filled 2020-08-15: qty 2

## 2020-08-15 SURGICAL SUPPLY — 73 items
BAG DECANTER FOR FLEXI CONT (MISCELLANEOUS) ×3 IMPLANT
BINDER BREAST LRG (GAUZE/BANDAGES/DRESSINGS) IMPLANT
BINDER BREAST XLRG (GAUZE/BANDAGES/DRESSINGS) ×3 IMPLANT
BLADE SURG 11 STRL SS (BLADE) ×3 IMPLANT
BLADE SURG 15 STRL LF DISP TIS (BLADE) ×2 IMPLANT
BLADE SURG 15 STRL SS (BLADE) ×1
BNDG COHESIVE 4X5 TAN STRL (GAUZE/BANDAGES/DRESSINGS) ×3 IMPLANT
CANISTER SUCT 3000ML PPV (MISCELLANEOUS) ×3 IMPLANT
CHLORAPREP W/TINT 26 (MISCELLANEOUS) ×6 IMPLANT
CLIP VESOCCLUDE LG 6/CT (CLIP) ×3 IMPLANT
CLIP VESOCCLUDE MED 6/CT (CLIP) ×3 IMPLANT
CLIP VESOCCLUDE SM WIDE 6/CT (CLIP) ×3 IMPLANT
CNTNR URN SCR LID CUP LEK RST (MISCELLANEOUS) IMPLANT
CONT SPEC 4OZ STRL OR WHT (MISCELLANEOUS)
COVER BACK TABLE 60X90IN (DRAPES) ×3 IMPLANT
COVER PROBE W GEL 5X96 (DRAPES) ×3 IMPLANT
COVER SURGICAL LIGHT HANDLE (MISCELLANEOUS) ×3 IMPLANT
COVER TRANSDUCER ULTRASND GEL (DISPOSABLE) IMPLANT
COVER WAND RF STERILE (DRAPES) IMPLANT
DECANTER SPIKE VIAL GLASS SM (MISCELLANEOUS) ×6 IMPLANT
DERMABOND ADVANCED (GAUZE/BANDAGES/DRESSINGS) ×2
DERMABOND ADVANCED .7 DNX12 (GAUZE/BANDAGES/DRESSINGS) ×4 IMPLANT
DEVICE DUBIN SPECIMEN MAMMOGRA (MISCELLANEOUS) ×3 IMPLANT
DRAPE C-ARM 42X120 X-RAY (DRAPES) ×3 IMPLANT
DRAPE CHEST BREAST 15X10 FENES (DRAPES) ×3 IMPLANT
DRAPE SURG 17X23 STRL (DRAPES) ×3 IMPLANT
DRAPE WARM FLUID 44X44 (DRAPES) IMPLANT
DRSG PAD ABDOMINAL 8X10 ST (GAUZE/BANDAGES/DRESSINGS) ×6 IMPLANT
ELECT COATED BLADE 2.86 ST (ELECTRODE) ×3 IMPLANT
ELECT NEEDLE BLADE 2-5/6 (NEEDLE) ×3 IMPLANT
ELECT REM PT RETURN 9FT ADLT (ELECTROSURGICAL) ×3
ELECTRODE REM PT RTRN 9FT ADLT (ELECTROSURGICAL) ×2 IMPLANT
GAUZE 4X4 16PLY RFD (DISPOSABLE) ×3 IMPLANT
GAUZE SPONGE 4X4 12PLY STRL (GAUZE/BANDAGES/DRESSINGS) ×3 IMPLANT
GEL ULTRASOUND 20GR AQUASONIC (MISCELLANEOUS) ×3 IMPLANT
GLOVE BIO SURGEON STRL SZ 6 (GLOVE) ×6 IMPLANT
GLOVE INDICATOR 6.5 STRL GRN (GLOVE) ×6 IMPLANT
GOWN STRL REUS W/ TWL LRG LVL3 (GOWN DISPOSABLE) ×2 IMPLANT
GOWN STRL REUS W/TWL 2XL LVL3 (GOWN DISPOSABLE) ×6 IMPLANT
GOWN STRL REUS W/TWL LRG LVL3 (GOWN DISPOSABLE) ×1
KIT BASIN OR (CUSTOM PROCEDURE TRAY) ×3 IMPLANT
KIT MARKER MARGIN INK (KITS) ×3 IMPLANT
KIT PORT POWER 8FR ISP CVUE (Port) ×3 IMPLANT
KIT TURNOVER KIT B (KITS) ×3 IMPLANT
LIGHT WAVEGUIDE WIDE FLAT (MISCELLANEOUS) ×3 IMPLANT
NEEDLE 18GX1X1/2 (RX/OR ONLY) (NEEDLE) IMPLANT
NEEDLE 22X1 1/2 (OR ONLY) (NEEDLE) ×3 IMPLANT
NEEDLE FILTER BLUNT 18X 1/2SAF (NEEDLE)
NEEDLE FILTER BLUNT 18X1 1/2 (NEEDLE) IMPLANT
NEEDLE HYPO 25GX1X1/2 BEV (NEEDLE) ×3 IMPLANT
NS IRRIG 1000ML POUR BTL (IV SOLUTION) ×3 IMPLANT
PACK GENERAL/GYN (CUSTOM PROCEDURE TRAY) IMPLANT
PACK UNIVERSAL I (CUSTOM PROCEDURE TRAY) ×3 IMPLANT
PAD ARMBOARD 7.5X6 YLW CONV (MISCELLANEOUS) ×3 IMPLANT
PENCIL BUTTON HOLSTER BLD 10FT (ELECTRODE) IMPLANT
PENCIL SMOKE EVACUATOR (MISCELLANEOUS) ×3 IMPLANT
POSITIONER HEAD DONUT 9IN (MISCELLANEOUS) ×3 IMPLANT
SHEATH COOK PEEL AWAY SET 9F (SHEATH) ×3 IMPLANT
STOCKINETTE IMPERVIOUS 9X36 MD (GAUZE/BANDAGES/DRESSINGS) ×3 IMPLANT
SUT MNCRL AB 4-0 PS2 18 (SUTURE) ×3 IMPLANT
SUT MON AB 4-0 PC3 18 (SUTURE) ×3 IMPLANT
SUT PROLENE 2 0 SH DA (SUTURE) ×6 IMPLANT
SUT VIC AB 3-0 SH 27 (SUTURE) ×1
SUT VIC AB 3-0 SH 27X BRD (SUTURE) ×2 IMPLANT
SUT VIC AB 3-0 SH 8-18 (SUTURE) ×3 IMPLANT
SYR 5ML LUER SLIP (SYRINGE) ×3 IMPLANT
SYR BULB IRRIG 60ML STRL (SYRINGE) ×3 IMPLANT
SYR CONTROL 10ML LL (SYRINGE) ×3 IMPLANT
TOWEL GREEN STERILE (TOWEL DISPOSABLE) ×3 IMPLANT
TOWEL GREEN STERILE FF (TOWEL DISPOSABLE) ×3 IMPLANT
TRAY LAPAROSCOPIC MC (CUSTOM PROCEDURE TRAY) IMPLANT
TUBE CONNECTING 12X1/4 (SUCTIONS) ×3 IMPLANT
YANKAUER SUCT BULB TIP NO VENT (SUCTIONS) ×3 IMPLANT

## 2020-08-15 NOTE — Anesthesia Procedure Notes (Signed)
Anesthesia Regional Block: Pectoralis block   Pre-Anesthetic Checklist: ,, timeout performed, Correct Patient, Correct Site, Correct Laterality, Correct Procedure, Correct Position, site marked, Risks and benefits discussed,  Surgical consent,  Pre-op evaluation,  At surgeon's request and post-op pain management  Laterality: Left  Prep: chloraprep       Needles:  Injection technique: Single-shot  Needle Type: Echogenic Needle     Needle Length: 10cm  Needle Gauge: 21     Additional Needles:   Narrative:  Start time: 08/15/2020 8:38 AM End time: 08/15/2020 8:42 AM Injection made incrementally with aspirations every 5 mL.  Performed by: Personally  Anesthesiologist: Audry Pili, MD  Additional Notes: No pain on injection. No increased resistance to injection. Injection made in 5cc increments. Good needle visualization. Patient tolerated the procedure well.

## 2020-08-15 NOTE — Interval H&P Note (Signed)
History and Physical Interval Note:  08/15/2020 7:54 AM  Cindy [name]  has presented today for surgery, with the diagnosis of LEFT BREAST CANCER.  The various methods of treatment have been discussed with the patient and family. After consideration of risks, benefits and other options for treatment, the patient has consented to  Procedure(s) with comments: LEFT BREAST LUMPECTOMY WITH RADIOACTIVE SEED AND SENTINEL LYMPH NODE BIOPSY (Left) - RNFA INSERTION PORT-A-CATH WITH ULTRASOUND GUIDANCE (N/A) as a surgical intervention.  The patient's history has been reviewed, patient examined, no change in status, stable for surgery.  I have reviewed the patient's chart and labs.  Questions were answered to the patient's satisfaction.     Stark Klein

## 2020-08-15 NOTE — Transfer of Care (Signed)
Immediate Anesthesia Transfer of Care Note  Patient: Cindy [name]  Procedure(s) Performed: LEFT BREAST LUMPECTOMY WITH RADIOACTIVE SEED AND SENTINEL LYMPH NODE BIOPSY (Left Breast) INSERTION PORT-A-CATH (Right Chest)  Patient Location: PACU  Anesthesia Type:General  Level of Consciousness: awake, alert  and oriented  Airway & Oxygen Therapy: Patient Spontanous Breathing and Patient connected to face mask  Post-op Assessment: Report given to RN and Post -op Vital signs reviewed and stable  Post vital signs: Reviewed and stable  Last Vitals:  Vitals Value Taken Time  BP    Temp    Pulse    Resp    SpO2      Last Pain:  Vitals:   08/15/20 0743  TempSrc:   PainSc: 2       Patients Stated Pain Goal: 3 (76/16/07 3710)  Complications: No complications documented.

## 2020-08-15 NOTE — Op Note (Signed)
Left Breast Radioactive seed localized lumpectomy and sentinel lymph node biopsy, port placement  Indications: This patient presents with history of left breast cancer, cT1bN0, triple negative, UOQ  Pre-operative Diagnosis: left breast cancer  Post-operative Diagnosis: Same  Surgeon: Stark Klein   Assistant:  Arrie Aran, RNFA  Anesthesia: General endotracheal anesthesia  ASA Class: 3  Procedure Details  The patient was seen in the Holding Room. The risks, benefits, complications, treatment options, and expected outcomes were discussed with the patient. The possibilities of bleeding, infection, the need for additional procedures, failure to diagnose a condition, and creating a complication requiring transfusion or operation were discussed with the patient. The patient concurred with the proposed plan, giving informed consent.  The site of surgery properly noted/marked. The patient was taken to Operating Room # 2, identified, and the procedure verified as  Left Breast Seed Localized Lumpectomy with sentinel node and port placement. A Time Out was held and the above information confirmed.  The bilateral breast, chest, and left arm were prepped and draped in standard fashion.  Local anesthetic was administered over this area at the angle of the clavicle.  The vein was accessed with 2 pass(es) of the needle. There was good venous return and the wire passed easily with no ectopy.   Fluoroscopy was used to confirm that the wire was in the vena cava.      The patient was placed back level and the area for the pocket was anethetized   with local anesthetic.  A 3-cm transverse incision was made with a #15   blade.  Cautery was used to divide the subcutaneous tissues down to the   pectoralis muscle.  An [employer] retractor was used to elevate the skin   while a pocket was created on top of the pectoralis fascia.  The port   was placed into the pocket to confirm that it was of adequate size.  The    catheter was preattached to the port.  The port was then secured to the   pectoralis fascia with four 2-0 Prolene sutures.  These were clamped and   not tied down yet.    The catheter was tunneled through to the wire exit site.  The catheter was placed along the wire to determine what length it should be to be in the SVC.  The catheter was cut at 13 cm.  The tunneler sheath and dilator were passed over the wire and the dilator and wire were removed.  The catheter was advanced through the tunneler sheath and the tunneler sheath was pulled away.  Care was taken to keep the catheter in the tunneler sheath as this occurred. This was advanced and the tunneler sheath was removed.  There was good venous return and easy flush of the catheter, unfortunately, it appeared to be too short.  The wire was passed back through the catheter, and the leftover catheter was used.  A new sheath was obtained, and around 17 cm of additional catheter was used.  This was connected back to the port with the cap.    The Prolene sutures were tied down to the pectoral fascia.  The skin was reapproximated using 3-0 Vicryl interrupted deep dermal sutures.    Fluoroscopy was used to re-confirm good position of the catheter.  The skin   was then closed using 4-0 Monocryl in a subcuticular fashion.  The port was flushed with concentrated heparin flush as well.  The wounds were then cleaned, dried, and dressed with Dermabond.  The lumpectomy was performed by creating an axillary incision near the previously placed radioactive seed.  Dissection was carried down to around the point of maximum signal intensity. The cautery was used to perform the dissection.  Hemostasis was achieved with cautery. The edges of the cavity were marked with large clips, with one each medial, lateral, inferior and superior, and two clips posteriorly.   The specimen was inked with the margin marker paint kit.    Specimen radiography confirmed inclusion of the  mammographic lesion, the clip, and the seed.  The background signal in the breast was zero.   Using a hand-held gamma probe, left axillary sentinel nodes were identified. Sentinel nodes were obtained through the same incision.  Dissection was carried through the clavipectoral fascia.  Two deep level 2 axillary sentinel nodes were removed.  Counts per second were 493 and 221.    The background count was 30 cps.  The wound was irrigated.  Hemostasis was achieved with cautery.  The axillary incision was closed with a 3-0 vicryl deep dermal interrupted sutures and a 4-0 monocryl subcuticular closure.    Sterile dressings were applied. At the end of the operation, all sponge, instrument, and needle counts were correct.  Findings: grossly clear surgical margins and no adenopathy, anterior margin is skin and posterior margin is pectoralis.  Lateral margin is axilla.  Estimated Blood Loss:  min         Specimens: left breast lumpectomy with seed and 2 clips and two deep left axillary sentinel lymph nodes.             Complications:  None; patient tolerated the procedure well.         Disposition: PACU - hemodynamically stable.         Condition: stable

## 2020-08-15 NOTE — Anesthesia Postprocedure Evaluation (Signed)
Anesthesia Post Note  Patient: Colinda Barth  Procedure(s) Performed: LEFT BREAST LUMPECTOMY WITH RADIOACTIVE SEED AND SENTINEL LYMPH NODE BIOPSY (Left Breast) INSERTION PORT-A-CATH (Right Chest)     Patient location during evaluation: PACU Anesthesia Type: General Level of consciousness: awake and alert Pain management: pain level controlled Vital Signs Assessment: post-procedure vital signs reviewed and stable Respiratory status: spontaneous breathing, nonlabored ventilation and respiratory function stable Cardiovascular status: blood pressure returned to baseline and stable Postop Assessment: no apparent nausea or vomiting Anesthetic complications: no   No complications documented.  Last Vitals:  Vitals:   08/15/20 1220 08/15/20 1235  BP: 124/76 119/83  Pulse: 87 86  Resp: 13 14  Temp:  36.5 C  SpO2: 94% 96%    Last Pain:  Vitals:   08/15/20 1235  TempSrc:   PainSc: 0-No pain                 Audry Pili

## 2020-08-15 NOTE — Anesthesia Procedure Notes (Signed)
Procedure Name: LMA Insertion Performed by: Rosaland Lao, CRNA Pre-anesthesia Checklist: Patient identified, Emergency Drugs available, Suction available and Patient being monitored Patient Re-evaluated:Patient Re-evaluated prior to induction Oxygen Delivery Method: Circle system utilized Preoxygenation: Pre-oxygenation with 100% oxygen Induction Type: IV induction LMA: LMA inserted LMA Size: 4.0 Number of attempts: 1 Tube secured with: Tape Dental Injury: Teeth and Oropharynx as per pre-operative assessment

## 2020-08-16 ENCOUNTER — Encounter (HOSPITAL_COMMUNITY): Payer: Self-pay | Admitting: General Surgery

## 2020-08-17 LAB — SURGICAL PATHOLOGY

## 2020-08-21 ENCOUNTER — Telehealth: Payer: Self-pay | Admitting: General Surgery

## 2020-08-21 ENCOUNTER — Encounter: Payer: Self-pay | Admitting: *Deleted

## 2020-08-21 NOTE — Telephone Encounter (Signed)
Discussed pathology with patient.  Location of positive margin is skin and does not need resection.

## 2020-08-22 NOTE — Progress Notes (Signed)
Patient Care Team: Emeterio Reeve, DO as PCP - General (Osteopathic Medicine) Mauro Kaufmann, RN as Oncology Nurse Navigator Rockwell Germany, RN as Oncology Nurse Navigator Stark Klein, MD as Consulting Physician (General Surgery) Nicholas Lose, MD as Consulting Physician (Hematology and Oncology) Eppie Gibson, MD as Attending Physician (Radiation Oncology)  DIAGNOSIS:    ICD-10-CM   1. Malignant neoplasm of upper-outer quadrant of left breast in female, estrogen receptor negative ([place])  C50.412    Z17.1     SUMMARY OF ONCOLOGIC HISTORY: Oncology History  Malignant neoplasm of upper-outer quadrant of left breast in female, estrogen receptor negative ([place])  06/30/2020 Initial Diagnosis   Screening mammogram showed a possible left breast mass. Diagnostic mammogram showed two adjacent masses at the 1 o'clock position, 0.7cm and 0.8cm, about 0.5cm apart, and spanning in total 1.8cm, no left axillary adenopathy. Biopsy showed IDC with squamous differentiation and DCIS, grade 3, HER-2 negative (1+), ER/PR negative, Ki67 80%.    07/05/2020 Cancer Staging   Staging form: Breast, AJCC 8th Edition - Clinical stage from 07/05/2020: Stage IB (cT1c, cN0, cM0, G3, ER-, PR-, HER2-) - Signed by Nicholas Lose, MD on 07/05/2020   07/11/2020 Genetic Testing   Negative genetic testing: no mutations detected in [facility] Common Hereditary Cancers Panel.  The report date is July 11, 2020.   The Common Hereditary Cancers Panel offered by [facility] includes sequencing and/or deletion duplication testing of the following 48 genes: APC, ATM, AXIN2, BARD1, BMPR1A, BRCA1, BRCA2, BRIP1, CDH1, CDK4, CDKN2A (p14ARF), CDKN2A (p16INK4a), CHEK2, CTNNA1, DICER1, EPCAM (Deletion/duplication testing only), GREM1 (promoter region deletion/duplication testing only), KIT, MEN1, MLH1, MSH2, MSH3, MSH6, MUTYH, NBN, NF1, NHTL1, PALB2, PDGFRA, PMS2, POLD1, POLE, PTEN, RAD50, RAD51C, RAD51D, RNF43, SDHB, SDHC, SDHD, SMAD4,  SMARCA4. STK11, TP53, TSC1, TSC2, and VHL.  The following genes were evaluated for sequence changes only: SDHA and HOXB13 c.251G>A variant only.   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     CHIEF COMPLIANT: Follow-up s/p left lumpectomy   INTERVAL HISTORY: Cindy [name] is a 74 y.o. with above-mentioned history of invasive ductal carcinoma of the left breast. She underwent a left lumpectomy on 08/15/20 with Dr. 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 She presents to the clinic today to discuss the pathology report and further treatment.   ALLERGIES:  is allergic to sulfamethoxazole-trimethoprim and tetracyclines & related.  MEDICATIONS:  Current Outpatient Medications  Medication Sig Dispense Refill  . acetaminophen (TYLENOL) 500 MG tablet Take 1,000 mg by mouth every 6 (six) hours as needed.    . celecoxib (CELEBREX) 200 MG capsule One to 2 tablets by mouth daily as needed for pain. (Patient taking differently: Take 200-400 mg by mouth daily as needed for moderate pain. ) 180 capsule 3  . cetirizine (ZYRTEC) 10 MG tablet Take 10 mg by mouth daily as needed for allergies.    . cholecalciferol (VITAMIN D3) 25 MCG (1000 UNIT) tablet Take 2,000 Units by mouth daily.    . cycloSPORINE (RESTASIS) 0.05 % ophthalmic emulsion Place 1 drop into both eyes 2 (two) times daily as needed (dry eyes).    Marland Kitchen denosumab (PROLIA) 60 MG/ML SOSY injection Inject into the skin every 6 (six) months.    . diclofenac sodium (VOLTAREN) 1 % GEL  Apply 4 g topically 4 (four) times daily. (Patient taking differently: Apply 2 g topically 4 (four) times daily as needed (pain). ) 100 g 11  . escitalopram (LEXAPRO) 10 MG tablet Take 5 mg by  mouth daily.    Marland Kitchen esomeprazole (NEXIUM) 40 MG capsule Take 1 capsule by mouth once daily 90 capsule 0  . hydrochlorothiazide (HYDRODIURIL) 25 MG tablet Take 1 tablet (25 mg total) by mouth daily. As need for swelling (Patient taking differently: Take 25 mg by mouth daily as needed (swelling). ) 90 tablet 0  . levothyroxine (SYNTHROID) 75 MCG tablet Take 1 tablet (75 mcg total) by mouth daily before breakfast. Take on Days 1-6 and skip Day 7- NEED ANNUAL EXAM AND LABS (Patient taking differently: Take 75 mcg by mouth See admin instructions. Take on Days 1-6 (Mon - Sat) and skip Day 7 (Sundays) - NEED ANNUAL EXAM AND LABS) 90 tablet 2  . montelukast (SINGULAIR) 10 MG tablet Take 1 tablet (10 mg total) by mouth at bedtime. (Patient taking differently: Take 10 mg by mouth at bedtime as needed (allergies). ) 90 tablet 1  . Multiple Vitamins-Minerals (THRIVE FOR LIFE WOMENS PO) Take 1 tablet by mouth every morning. Also Thrive patch and Thrive milkshake daily    . NIFEdipine (PROCARDIA-XL/NIFEDICAL-XL) 30 MG 24 hr tablet Take 1 tablet by mouth once daily (Patient taking differently: Take 30 mg by mouth daily. ) 90 tablet 2  . nitroGLYCERIN (NITROSTAT) 0.4 MG SL tablet Place 1 tablet (0.4 mg total) under the tongue every 5 (five) minutes as needed for chest pain. 20 tablet 1  . oxyCODONE (OXY IR/ROXICODONE) 5 MG immediate release tablet Take 0.5-1 tablets (2.5-5 mg total) by mouth every 6 (six) hours as needed for severe pain. 10 tablet 0  . Probiotic Product (PROBIOTIC PO) Take 1 capsule by mouth daily.    . rosuvastatin (CRESTOR) 20 MG tablet Take 1 tablet (20 mg total) by mouth daily. 90 tablet 3  . SUMAtriptan (IMITREX) 100 MG tablet Take 0.5-1 tablets (50-100 mg total) by mouth every 2 (two) hours as needed for migraine (Max 2 pills per 24 hours). 10 tablet 3  . vitamin B-12 (CYANOCOBALAMIN) 500 MCG tablet Take 500 mcg by mouth daily.      No current facility-administered medications for this visit.     PHYSICAL EXAMINATION: ECOG PERFORMANCE STATUS: 1 - Symptomatic but completely ambulatory  There were no vitals filed for this visit. There were no vitals filed for this visit.   LABORATORY DATA:  I have reviewed the data as listed CMP Latest Ref Rng & Units 08/08/2020 07/05/2020 05/18/2020  Glucose 70 - 99 mg/dL 89 98 85  BUN 8 - 23 mg/dL 16 24(H) 23  Creatinine 0.44 - 1.00 mg/dL 0.85 1.12(H) 0.90  Sodium 135 - 145 mmol/L 138 141 142  Potassium 3.5 - 5.1 mmol/L 3.8 3.9 4.7  Chloride 98 - 111 mmol/L 106 103 105  CO2 22 - 32 mmol/L 23 31 32  Calcium 8.9 - 10.3 mg/dL 8.6(L) 9.2 8.9  Total Protein 6.5 - 8.1 g/dL - 7.0 -  Total Bilirubin 0.3 - 1.2 mg/dL - 0.7 -  Alkaline Phos 38 - 126 U/L - 64 -  AST 15 - 41 U/L - 22 -  ALT 0 - 44 U/L - 19 -    Lab Results  Component Value Date   WBC 6.9 08/08/2020   HGB 14.2 08/08/2020   HCT 44.6 08/08/2020   MCV 97.4 08/08/2020  PLT 200 08/08/2020   NEUTROABS 3.7 07/05/2020    ASSESSMENT & PLAN:  Malignant neoplasm of upper-outer quadrant of left breast in female, estrogen receptor negative ([place]) 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  ER/PR negative, Ki-67 80%, HER-2 negative  Pathology counseling: I discussed the final pathology report of the patient provided  a copy of this report. I discussed the margins as well as lymph node surgeries. We also discussed the final staging along with previously performed ER/PR and HER-2/neu testing.  Treatment plan: 1. adjuvant chemotherapy with CMF x6 cycles plan to start treatment 09/05/2020 2.  Follow-up adjuvant radiation  Return to clinic in 1 week after chemo for toxicity evaluation.  No orders of the defined types were placed in this encounter.  The patient has a good understanding of the overall plan. she agrees with it. she will call with any  problems that may develop before the next visit here.  Total time spent: 30 mins including face to face time and time spent for planning, charting and coordination of care  Nicholas Lose, MD 08/23/2020  I, Cloyde Reams Dorshimer, am acting as scribe for Dr. Nicholas Lose.  I have reviewed the above documentation for accuracy and completeness, and I agree with the above.

## 2020-08-23 ENCOUNTER — Other Ambulatory Visit: Payer: Self-pay

## 2020-08-23 ENCOUNTER — Inpatient Hospital Stay: Payer: Medicare Other | Attending: Hematology and Oncology | Admitting: Hematology and Oncology

## 2020-08-23 VITALS — BP 124/80 | HR 97 | Temp 97.3°F | Resp 18 | Ht 64.0 in | Wt 150.3 lb

## 2020-08-23 DIAGNOSIS — Z171 Estrogen receptor negative status [ER-]: Secondary | ICD-10-CM | POA: Insufficient documentation

## 2020-08-23 DIAGNOSIS — I208 Other forms of angina pectoris: Secondary | ICD-10-CM

## 2020-08-23 DIAGNOSIS — C50412 Malignant neoplasm of upper-outer quadrant of left female breast: Secondary | ICD-10-CM | POA: Diagnosis not present

## 2020-08-23 DIAGNOSIS — Z5111 Encounter for antineoplastic chemotherapy: Secondary | ICD-10-CM | POA: Insufficient documentation

## 2020-08-23 MED ORDER — PROCHLORPERAZINE MALEATE 10 MG PO TABS: 10.0000 mg | ORAL_TABLET | Freq: Four times a day (QID) | ORAL | 1 refills | Status: DC | PRN

## 2020-08-23 MED ORDER — LIDOCAINE-PRILOCAINE 2.5-2.5 % EX CREA: TOPICAL_CREAM | CUTANEOUS | 3 refills | Status: DC

## 2020-08-23 MED ORDER — ONDANSETRON HCL 8 MG PO TABS: 8.0000 mg | ORAL_TABLET | Freq: Two times a day (BID) | ORAL | 1 refills | Status: DC | PRN

## 2020-08-23 NOTE — Assessment & Plan Note (Signed)
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  ER/PR negative, Ki-67 80%, HER-2 negative  Pathology counseling: I discussed the final pathology report of the patient provided  a copy of this report. I discussed the margins as well as lymph node surgeries. We also discussed the final staging along with previously performed ER/PR and HER-2/neu testing.  Treatment plan: 1. adjuvant chemotherapy with CMF x6 cycles 2.  Follow-up adjuvant radiation  Return to clinic in 2-3 weeks to start chemo

## 2020-08-23 NOTE — Progress Notes (Signed)
START OFF PATHWAY REGIMEN - Breast   [id number]:CMF (IV cyclophosphamide) q21 days:   A cycle is every 21 days:     Cyclophosphamide      Methotrexate      Fluorouracil   **Always confirm dose/schedule in your pharmacy ordering system**  Patient Characteristics: Postoperative without Neoadjuvant Therapy (Pathologic Staging), Invasive Disease, Adjuvant Therapy, HER2 Negative/Unknown/Equivocal, ER Negative/Unknown, Node Negative, pT1a-c, N66m or pT1c or Higher, pN0 Therapeutic Status: Postoperative without Neoadjuvant Therapy (Pathologic Staging) AJCC Grade: G3 AJCC N Category: pN0 AJCC M Category: cM0 ER Status: Negative (-) AJCC 8 Stage Grouping: IB HER2 Status: Negative (-) Oncotype Dx Recurrence Score: Not Appropriate AJCC T Category: pT1c PR Status: Negative (-) Adjuvant Therapy Status: No Adjuvant Therapy Received Yet Intent of Therapy: Curative Intent, Discussed with Patient

## 2020-08-24 ENCOUNTER — Encounter: Payer: Self-pay | Admitting: *Deleted

## 2020-08-28 ENCOUNTER — Inpatient Hospital Stay: Payer: Medicare Other

## 2020-08-28 ENCOUNTER — Other Ambulatory Visit: Payer: Self-pay

## 2020-08-29 NOTE — Progress Notes (Signed)
Pharmacist Chemotherapy Monitoring - Initial Assessment    Anticipated start date: 09/05/20  Regimen:   Are orders appropriate based on the patients diagnosis, regimen, and cycle? Yes  Does the plan date match the patients scheduled date? Yes  Is the sequencing of drugs appropriate? Yes  Are the premedications appropriate for the patients regimen? Yes  Prior Authorization for treatment is: Approved o If applicable, is the correct biosimilar selected based on the patient's insurance? not applicable  Organ Function and Labs:  Are dose adjustments needed based on the patient's renal function, hepatic function, or hematologic function? No  Are appropriate labs ordered prior to the start of patient's treatment? Yes  Other organ system assessment, if indicated: N/A  The following baseline labs, if indicated, have been ordered: N/A  Dose Assessment:  Are the drug doses appropriate? Yes  Are the following correct: o Drug concentrations Yes o IV fluid compatible with drug Yes o Administration routes Yes o Timing of therapy Yes  If applicable, does the patient have documented access for treatment and/or plans for port-a-cath placement? yes  If applicable, have lifetime cumulative doses been properly documented and assessed? not applicable Lifetime Dose Tracking  No doses have been documented on this patient for the following tracked chemicals: Doxorubicin, Epirubicin, Idarubicin, Daunorubicin, Mitoxantrone, Bleomycin, Oxaliplatin, Carboplatin, Liposomal Doxorubicin  o   Toxicity Monitoring/Prevention:  The patient has the following take home antiemetics prescribed: Ondansetron and Prochlorperazine  The patient has the following take home medications prescribed: N/A  Medication allergies and previous infusion related reactions, if applicable, have been reviewed and addressed. Yes  The patient's current medication list has been assessed for drug-drug interactions with their  chemotherapy regimen. no significant drug-drug interactions were identified on review.  Order Review:  Are the treatment plan orders signed? Yes  Is the patient scheduled to see a provider prior to their treatment? No  I verify that I have reviewed each item in the above checklist and answered each question accordingly.  Kennith Center, Pharm.D., CPP 08/29/2020@4 :11 PM

## 2020-08-30 ENCOUNTER — Encounter: Payer: Self-pay | Admitting: Radiation Oncology

## 2020-08-30 NOTE — Progress Notes (Signed)
Per correspondence with Dr. Barry Dienes, the positive anterior margin is skin only and tumor wasn't invading skin, so no more surgery is planned. -----------------------------------  Eppie Gibson, MD

## 2020-08-31 ENCOUNTER — Encounter: Payer: Self-pay | Admitting: Hematology and Oncology

## 2020-09-02 IMAGING — DX DG KNEE COMPLETE 4+V*L*
5 series · 5 of 5 positions shown · non-contrast
Comparison: None.

CLINICAL DATA: Bilateral knee pain

EXAM:
LEFT KNEE - COMPLETE 4+ VIEW

[knee ap]
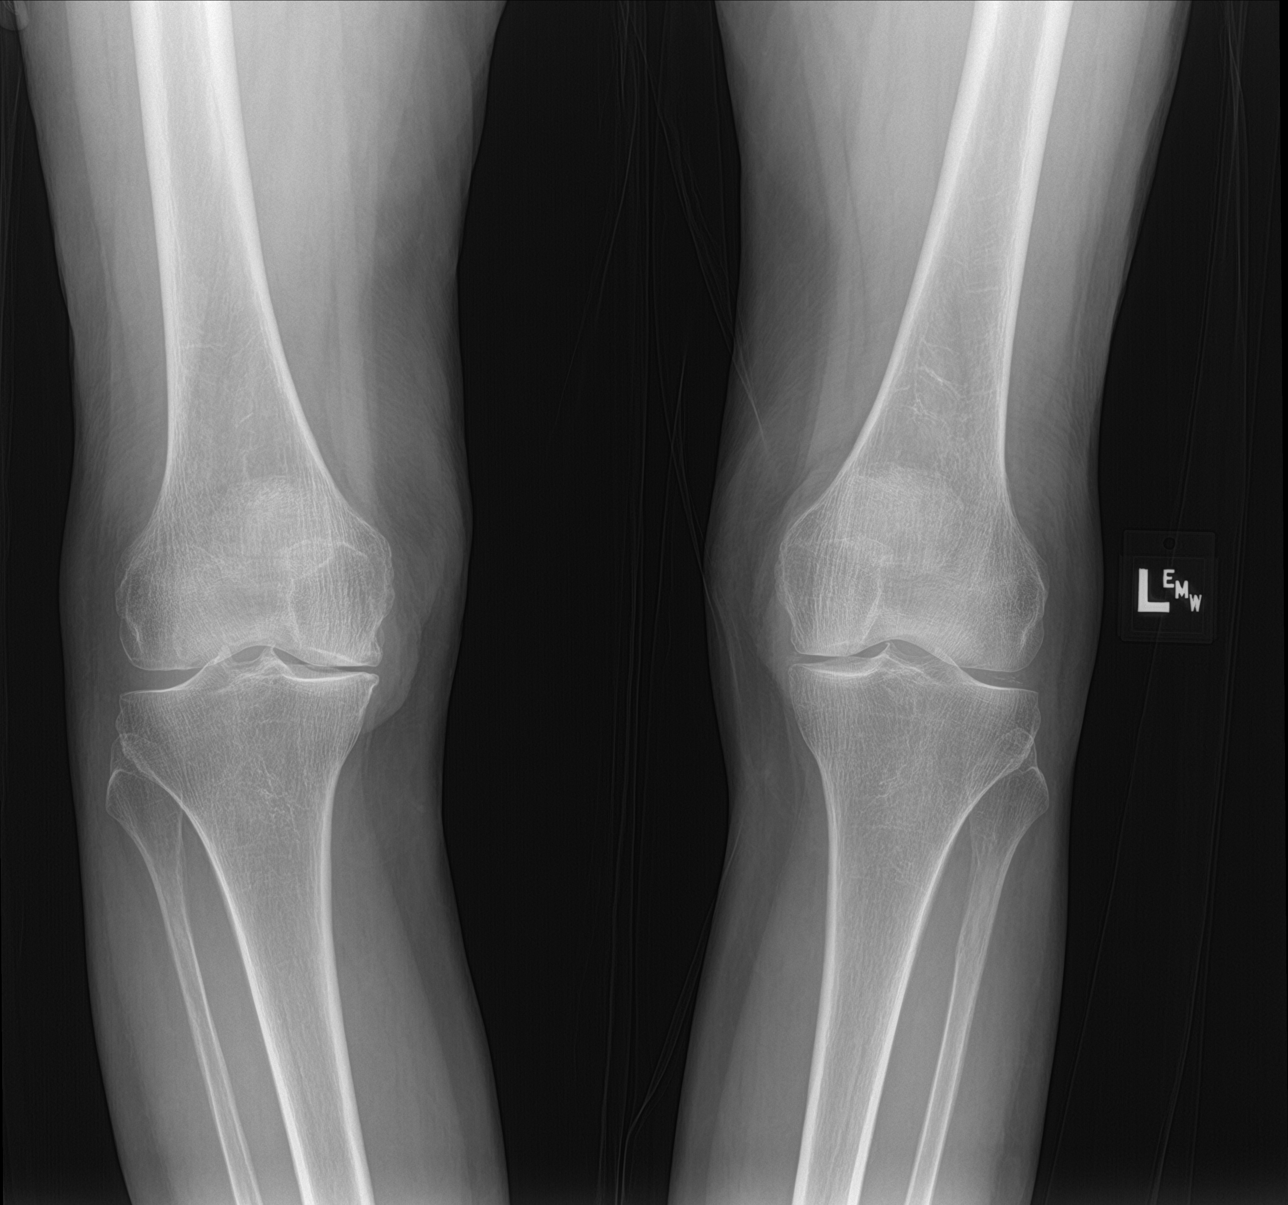

[tunnel]
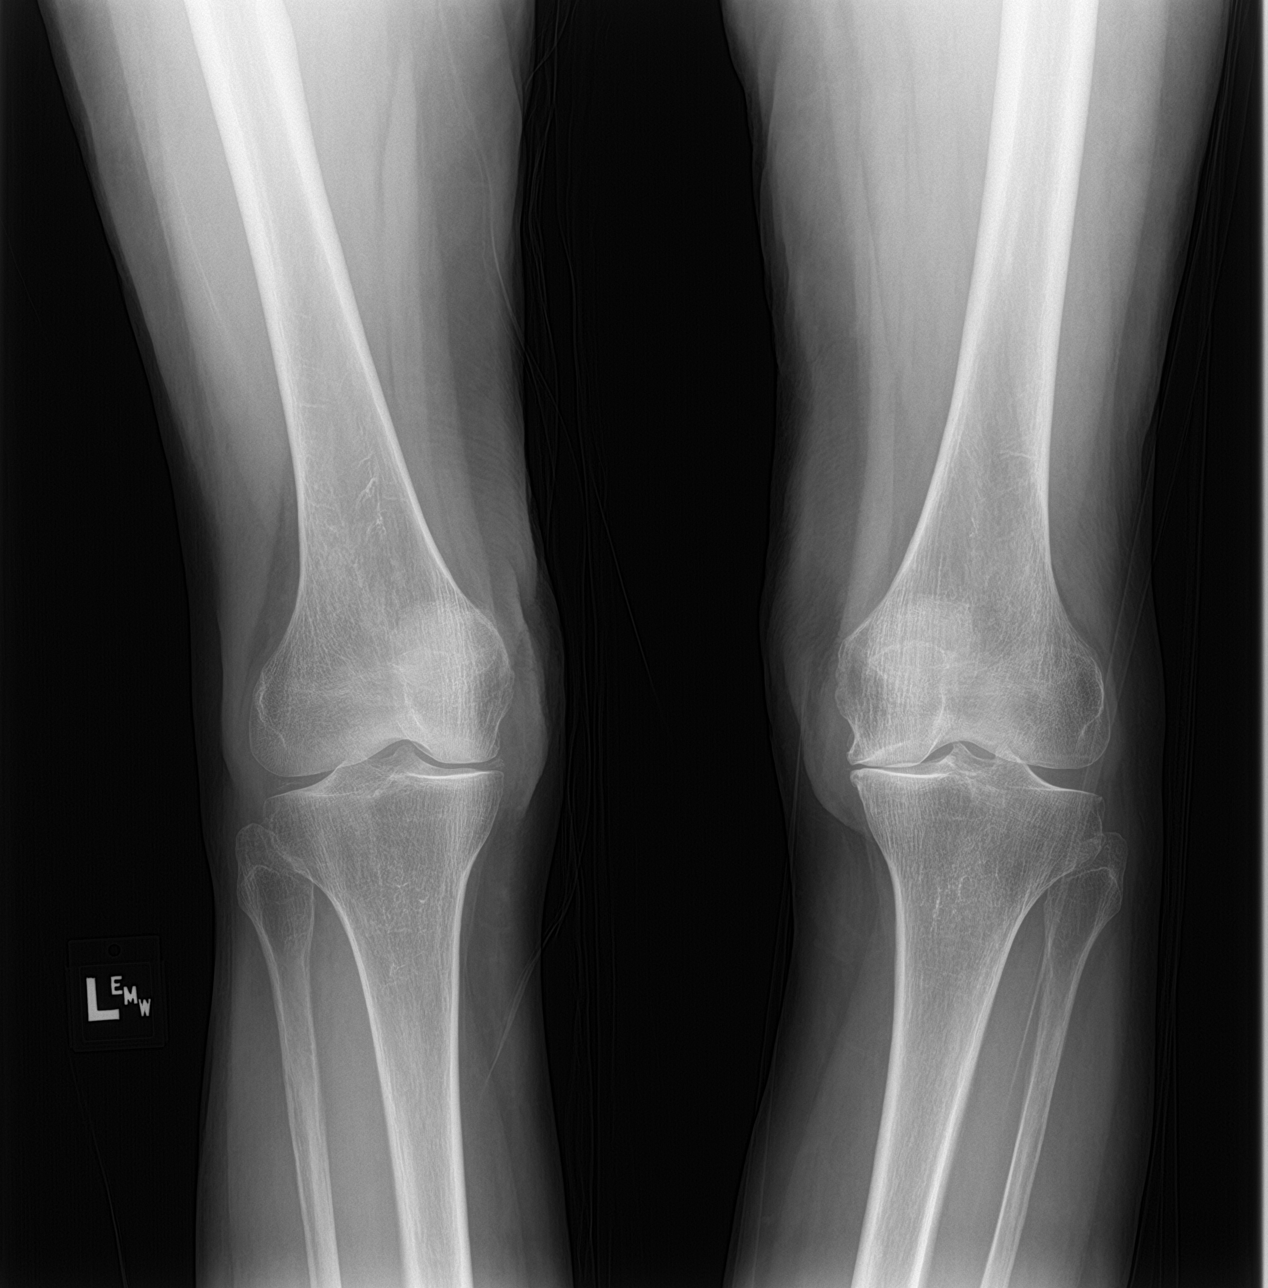

[knee lat (1 of 2)]
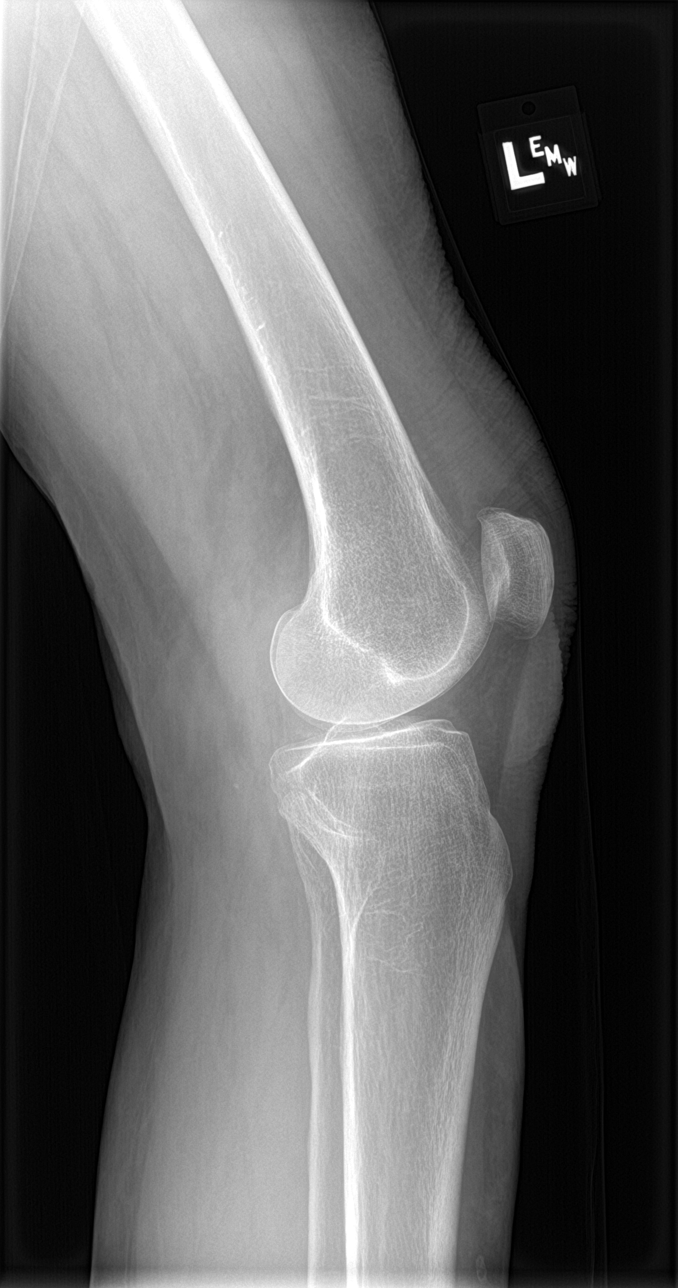

[knee sunrise]
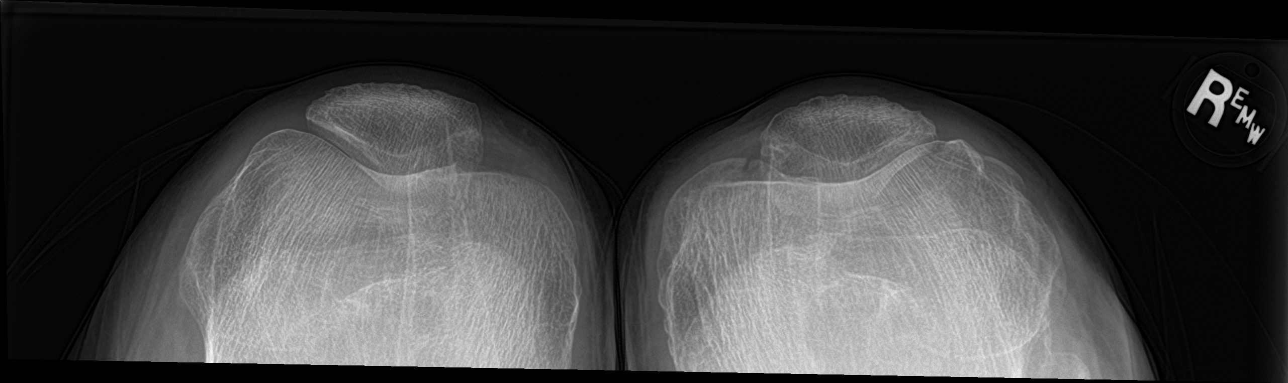

[knee lat (2 of 2)]
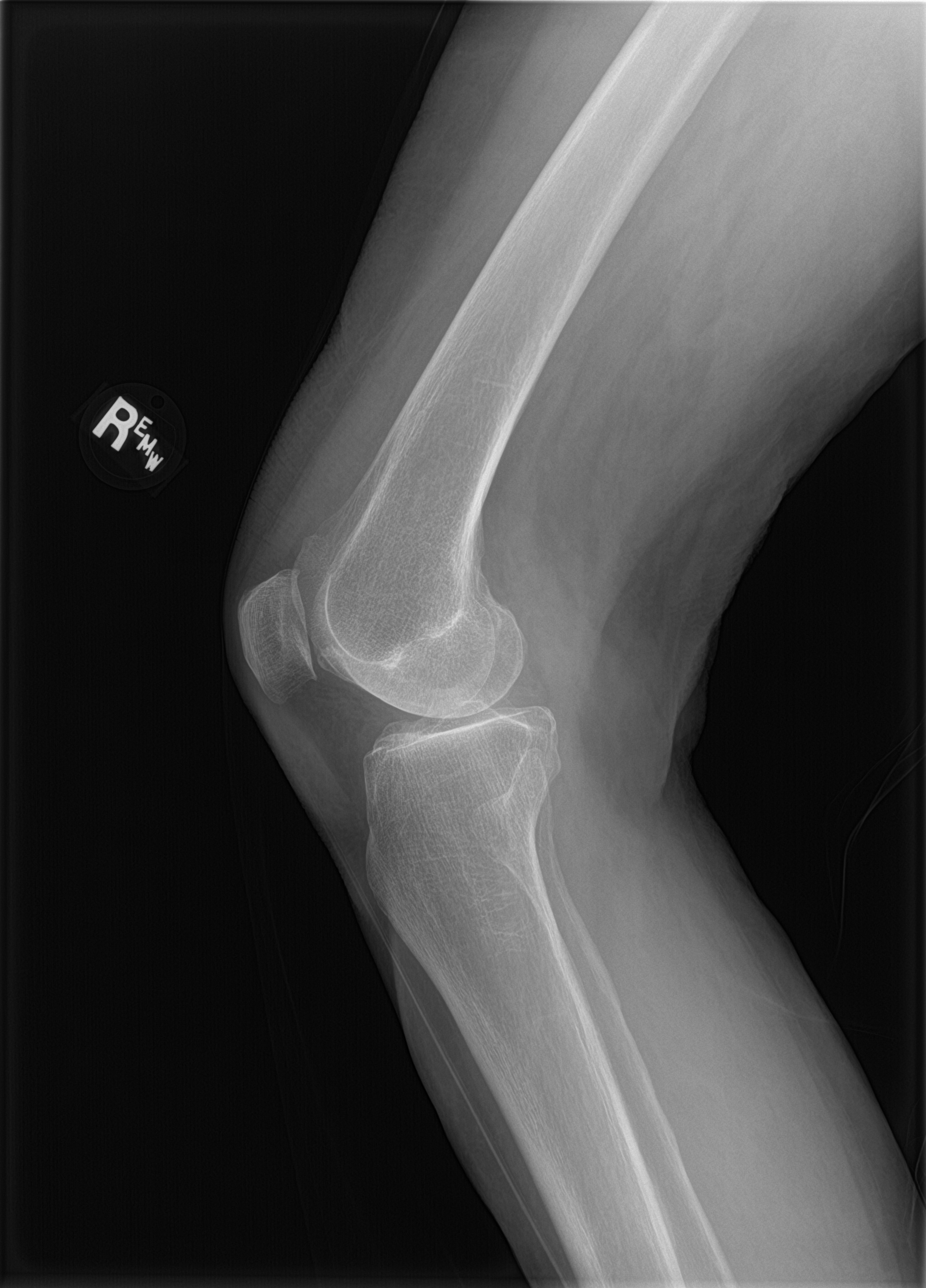

[5 of 5 positions shown; findings below may reference images not displayed]

FINDINGS: There is tricompartment joint space narrowing, most notable in the
medial compartment. Early spurring. No acute bony abnormality.
Specifically, no fracture, subluxation, or dislocation. No joint
effusion.
IMPRESSION: Mild degenerative changes, most pronounced in the medial
compartment. No acute bony abnormality.

## 2020-09-02 IMAGING — DX DG KNEE COMPLETE 4+V*R*
5 series · 5 of 5 positions shown · non-contrast
Comparison: None.

CLINICAL DATA: Bilateral knee pain

EXAM:
RIGHT KNEE - COMPLETE 4+ VIEW

[knee ap]
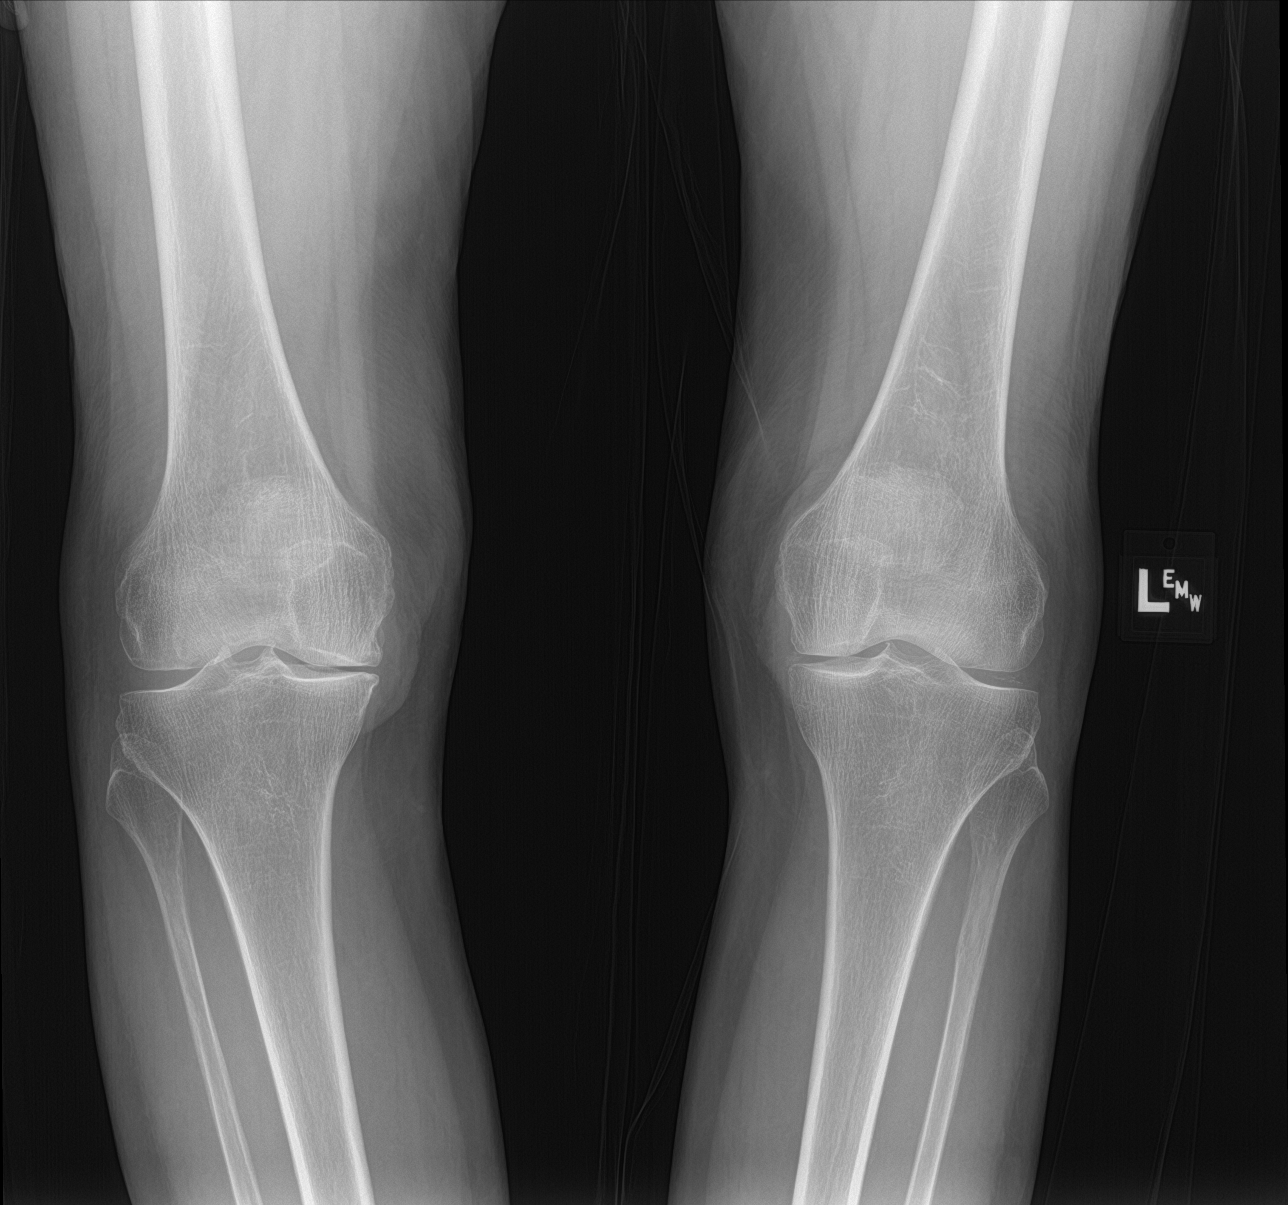

[tunnel]
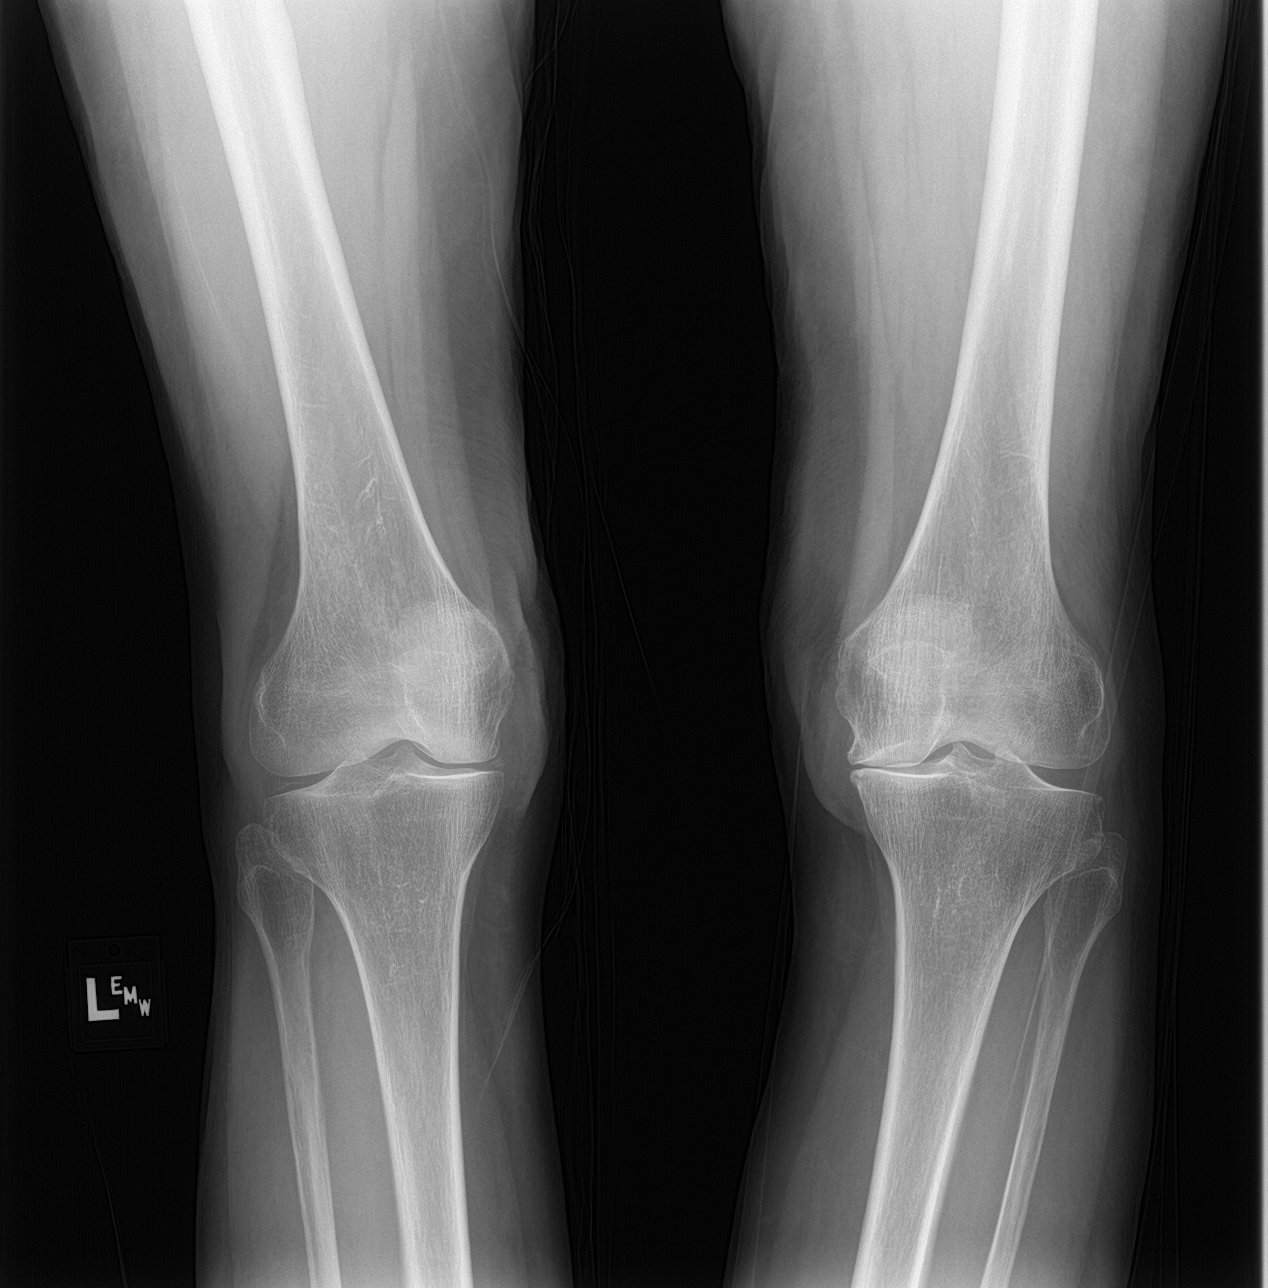

[knee lat (1 of 2)]
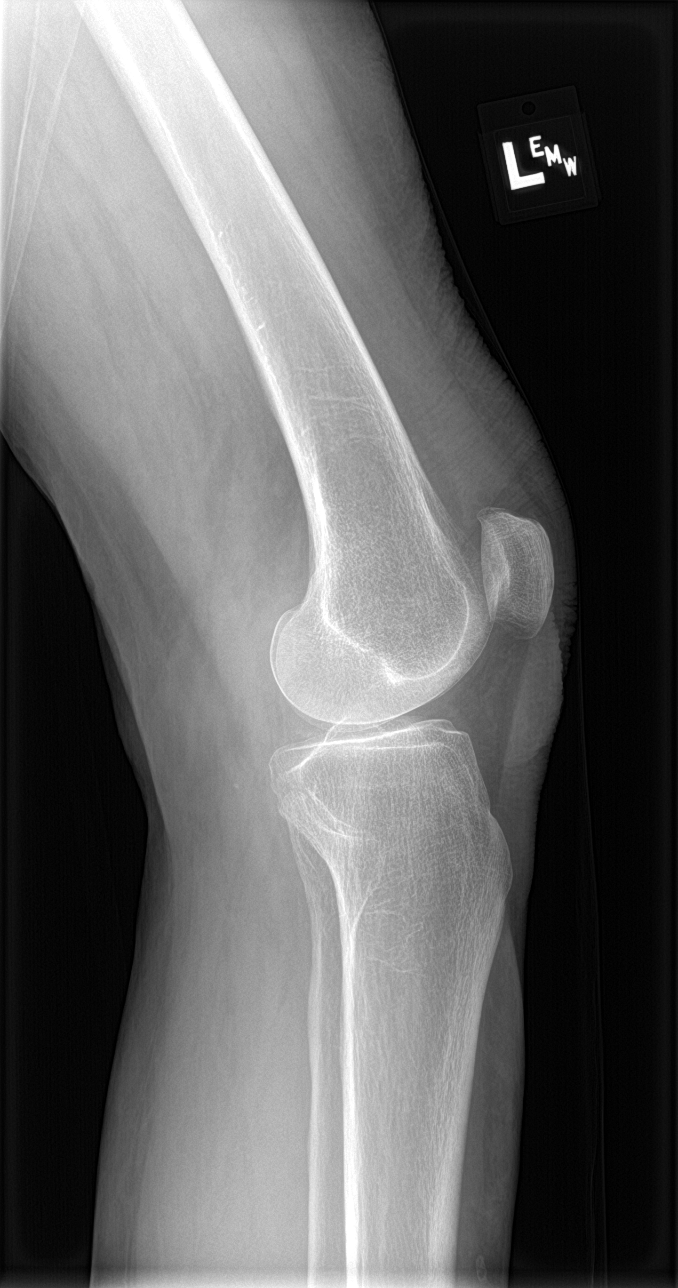

[knee sunrise]
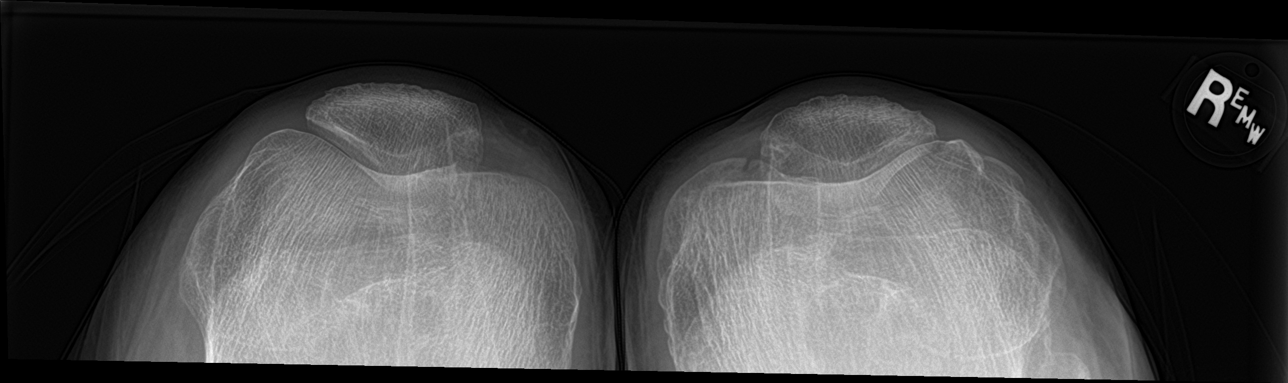

[knee lat (2 of 2)]
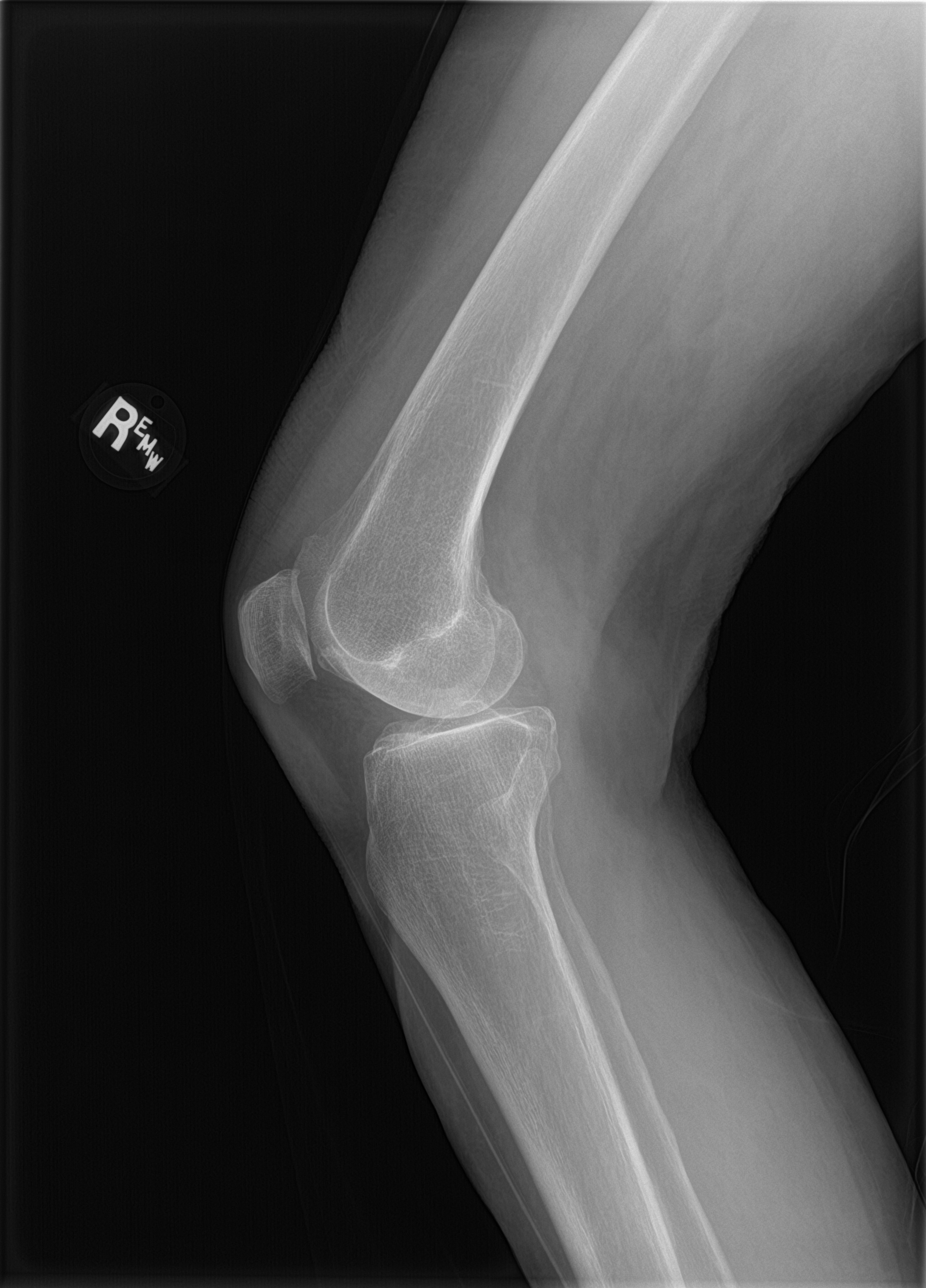

[5 of 5 positions shown; findings below may reference images not displayed]

FINDINGS: Mild to moderate tricompartment joint space narrowing, most
pronounced in the medial compartment. No acute bony abnormality.
Specifically, no fracture, subluxation, or dislocation. No joint
effusion.
IMPRESSION: Mild to moderate tricompartment degenerative changes as above. No
acute bony abnormality.

## 2020-09-04 ENCOUNTER — Encounter: Payer: Self-pay | Admitting: Physical Therapy

## 2020-09-04 ENCOUNTER — Other Ambulatory Visit: Payer: Self-pay

## 2020-09-04 ENCOUNTER — Ambulatory Visit: Payer: Medicare Other | Attending: General Surgery | Admitting: Physical Therapy

## 2020-09-04 ENCOUNTER — Encounter: Payer: Self-pay | Admitting: Hematology and Oncology

## 2020-09-04 DIAGNOSIS — R600 Localized edema: Secondary | ICD-10-CM | POA: Diagnosis not present

## 2020-09-04 DIAGNOSIS — C50412 Malignant neoplasm of upper-outer quadrant of left female breast: Secondary | ICD-10-CM | POA: Diagnosis not present

## 2020-09-04 DIAGNOSIS — Z170 Estrogen receptor positive status [ER+]: Secondary | ICD-10-CM | POA: Insufficient documentation

## 2020-09-04 DIAGNOSIS — Z483 Aftercare following surgery for neoplasm: Secondary | ICD-10-CM

## 2020-09-04 DIAGNOSIS — R293 Abnormal posture: Secondary | ICD-10-CM | POA: Diagnosis not present

## 2020-09-04 NOTE — Therapy (Signed)
[place], [place], [address] Phone: [phone]   Fax:  [phone]  Physical Therapy Treatment  Patient Details  Name: Cindy [name] MRN: [id number] Date of Birth: 1945/10/15 Referring Provider (PT): Dr. Stark Klein   Encounter Date: 09/04/2020   PT End of Session - 09/04/20 1149    Visit Number 2    Number of Visits 2    PT Start Time 1056    PT Stop Time 1145    PT Time Calculation (min) 49 min    Activity Tolerance Patient tolerated treatment well    Behavior During Therapy [facility] for tasks assessed/performed           Past Medical History:  Diagnosis Date  . Abnormal mammogram   . Allergic rhinitis 10/30/2018  . Anxiety in acute stress reaction 10/30/2018  . Arthritis   . Atypical chest pain 10/30/2018  . Cancer [facility])    breast   . Chest pain    "not cardiac related"  . Colon polyps   . Family history of breast cancer 07/05/2020  . Family history of pancreatic cancer 07/05/2020  . Family history of prostate cancer 07/05/2020  . GERD (gastroesophageal reflux disease) 10/30/2018  . History of kidney stones    passed  . Hypertension 10/30/2018  . Hyperthyroidism   . Hypothyroidism 10/30/2018  . Migraine 10/30/2018  . Osteoporosis 10/30/2018  . Pneumonia   . 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  . Vitamin D deficiency 10/30/2018    Past Surgical History:  Procedure Laterality Date  . BREAST LUMPECTOMY WITH RADIOACTIVE SEED AND SENTINEL LYMPH NODE BIOPSY Left 08/15/2020   Procedure: LEFT BREAST LUMPECTOMY WITH RADIOACTIVE SEED AND SENTINEL LYMPH NODE BIOPSY;  Surgeon: Stark Klein, MD;  Location: [place];  Service: General;  Laterality: Left;  RNFA  . COLONOSCOPY W/ POLYPECTOMY    . ESOPHAGEAL DILATION    . EYE SURGERY Bilateral    catheter with lens  . HYSTERECTOMY ABDOMINAL WITH SALPINGECTOMY    . PORTACATH PLACEMENT Right 08/15/2020   Procedure: INSERTION PORT-A-CATH;   Surgeon: Stark Klein, MD;  Location: [place];  Service: General;  Laterality: Right;  . TONSILLECTOMY    . TUBAL LIGATION      There were no vitals filed for this visit.   Subjective Assessment - 09/04/20 1056    Subjective Patient underwent a left lumpectomy on 08/15/2020 wth 2 negative nodes removed. She had a port placed at the time of surgery and begins chemotherapy 09/05/2020 followed by radiation. She c/o significant swelling in left breast and pain in her left chest and axilla.    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 She had left lumpectomy on 08/15/2020 wth 2 negative nodes removed. Ki67 is 80%.    Patient Stated Goals Reduce pain and swelling    Currently in Pain? Yes    Pain Score 2    Varies; worst at night   Pain Location Axilla    Pain Orientation Left    Pain Descriptors / Indicators Sore    Pain Type Surgical pain    Pain Onset 1 to 4 weeks ago    Pain Frequency Intermittent    Aggravating Factors  Night time    Pain Relieving Factors Unknown - meds?              [facility] PT Assessment - 09/04/20 0001      Assessment   Medical  Diagnosis s/p left lump SLN    Referring Provider (PT) Dr. Stark Klein    Onset Date/Surgical Date 08/15/20    Hand Dominance Right    Prior Therapy Baselines      Precautions   Precautions Other (comment)    Precaution Comments recent surgery; left arm lymphedema risk      Restrictions   Weight Bearing Restrictions No      Balance Screen   Has the patient fallen in the past 6 months No    Has the patient had a decrease in activity level because of a fear of falling?  No    Is the patient reluctant to leave their home because of a fear of falling?  No      Home Ecologist residence    Living Arrangements Spouse/significant other    Available Help at Discharge Family      Prior Function   Level of [place] Retired    Leisure Not exercising      Cognition   Overall Cognitive Status Within Functional Limits for tasks assessed      Observation/Other Assessments   Observations Large what appears to be a seroma in her left breast upper outer quadrant. It is painful and large. Photo taken (see media tab). Incisions appears to be healing well. No redness or signs of infection.      Posture/Postural Control   Posture/Postural Control Postural limitations    Postural Limitations Rounded Shoulders;Forward head;Increased thoracic kyphosis   Significant kyphosis limiting shoulder ROM     ROM / Strength   AROM / PROM / Strength AROM      AROM   AROM Assessment Site Shoulder    Right/Left Shoulder Left    Left Shoulder Extension 57 Degrees    Left Shoulder Flexion 127 Degrees    Left Shoulder ABduction 134 Degrees    Left Shoulder Internal Rotation 75 Degrees    Left Shoulder External Rotation 81 Degrees             LYMPHEDEMA/ONCOLOGY QUESTIONNAIRE - 09/04/20 0001      Type   Cancer Type Left breast cancer      Surgeries   Lumpectomy Date 08/15/20    Sentinel Lymph Node Biopsy Date 08/15/20    Number Lymph Nodes Removed 2      Treatment   Active Chemotherapy Treatment Yes    Date 09/05/20    Past Chemotherapy Treatment No    Active Radiation Treatment No    Past Radiation Treatment No    Current Hormone Treatment No    Past Hormone Therapy No      What other symptoms do you have   Are you Having Heaviness or Tightness Yes    Are you having Pain Yes    Are you having pitting edema No    Is it Hard or Difficult finding clothes that fit No    Do you have infections No    Is there Decreased scar mobility Yes    [place] Sign No      Lymphedema Assessments   Lymphedema Assessments Upper extremities      Right Upper Extremity Lymphedema   10 cm Proximal to Olecranon Process 25.5 cm    Olecranon Process 23.6 cm    10 cm Proximal to Ulnar Styloid  Process 18.5 cm    Just Proximal to Ulnar Styloid Process 14.1 cm    Across Hand at [employer]  Space 17.7 cm    At [place] of 2nd Digit 5.8 cm      Left Upper Extremity Lymphedema   10 cm Proximal to Olecranon Process 26.1 cm    Olecranon Process 23.5 cm    10 cm Proximal to Ulnar Styloid Process 18.4 cm    Just Proximal to Ulnar Styloid Process 14.6 cm    Across Hand at [employer] 17.3 cm    At [place] of 2nd Digit 5.8 cm              Quick Dash - 09/04/20 0001    Open a tight or new jar Mild difficulty    Do heavy household chores (wash walls, wash floors) Mild difficulty    Carry a shopping bag or briefcase Mild difficulty    Wash your back No difficulty    Use a knife to cut food No difficulty    Recreational activities in which you take some force or impact through your arm, shoulder, or hand (golf, hammering, tennis) Mild difficulty    During the past week, to what extent has your arm, shoulder or hand problem interfered with your normal social activities with family, friends, neighbors, or groups? Not at all    During the past week, to what extent has your arm, shoulder or hand problem limited your work or other regular daily activities Not at all    Arm, shoulder, or hand pain. Moderate    Tingling (pins and needles) in your arm, shoulder, or hand Mild    Difficulty Sleeping Mild difficulty    DASH Score 18.18 %                          PT Education - 09/04/20 1148    Education Details Getting fitted for a compression bra; scar massage; closed chain flexion stretch    Person(s) Educated Patient    Methods Explanation;Demonstration;Handout    Comprehension Returned demonstration;Verbalized understanding               PT Long Term Goals - 09/04/20 1152      PT LONG TERM GOAL #1   Title Patient will demonstrate she has regained full shoulder ROM and function post operatively compared to baseline.    Time 8    Period Weeks    Status Partially  Met                 Plan - 09/04/20 1149    Clinical Impression Statement Patient has nearly regained left shoulder ROM since her left lumpectomy and sentinel node biopsy on 08/15/2020. She has not seen her surgeon yet since surgery but is reporting significant pain in her left upper outer breast area. It appears she has a large seroma in that area so surgeon was contacted and pt will be seen this afternoon. She plans to attend the ABC class on 09/25/2020 for lymphedema risk reduction education, but other wise has no PT needs at this time. She will purchase a compression bra and see her surgeon for seroma management. If swelling does not improve in 2-3 weeks, she was encouraged to contact me so we can begin manual lymph drainage.    PT Treatment/Interventions ADLs/Self Care Home Management;Therapeutic exercise;Patient/family education    PT Next Visit Plan L-Dex screens every 3 months    PT Home Exercise Plan Post op shoulder ROM HEP and closed chain shoulder flexion    Consulted and Agree with Plan of  Care Patient           Patient will benefit from skilled therapeutic intervention in order to improve the following deficits and impairments:  Postural dysfunction,Decreased range of motion,Impaired UE functional use,Pain,Decreased knowledge of precautions,Decreased scar mobility,Increased edema  Visit Diagnosis: Malignant neoplasm of upper-outer quadrant of left breast in female, estrogen receptor positive ([place])  Abnormal posture  Aftercare following surgery for neoplasm  Localized edema     Problem List Patient Active Problem List   Diagnosis Date Noted  . Genetic testing 07/11/2020  . Family history of breast cancer 07/05/2020  . Family history of prostate cancer 07/05/2020  . Family history of pancreatic cancer 07/05/2020  . Malignant neoplasm of upper-outer quadrant of left breast in female, estrogen receptor negative ([place]) 06/30/2020  . 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 Primary osteoarthritis of both hands 06/23/2019  . Trigger thumb of left hand 06/23/2019  . Polyarthralgia 06/23/2019  . Hypertension 10/30/2018  . Atypical chest pain 10/30/2018  . Migraine 10/30/2018  . Hypothyroidism 10/30/2018  . Osteoporosis 10/30/2018  . GERD (gastroesophageal reflux disease) 10/30/2018  . Anxiety in acute stress reaction 10/30/2018  . Allergic rhinitis 10/30/2018  . Vitamin D deficiency 10/30/2018  . 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   PHYSICAL THERAPY DISCHARGE SUMMARY  Visits from Start of Care: 2  Current functional level related to goals / functional outcomes: Goals met except slight left shoulder ROM deficit.   Remaining deficits: Significant edema present left breast   Education / Equipment: Scar massage; getting a compression bra; HEP Plan: Patient agrees to discharge.  Patient goals were partially met. Patient is being discharged due to meeting the stated rehab goals.  ?????         Annia Friendly, [place] 09/04/20 11:58 AM   [place], [place], [address] Phone: [phone]   Fax:  [phone]  Name: Arriona Prest MRN: [id number] Date of Birth: 1946-08-07

## 2020-09-04 NOTE — Progress Notes (Signed)
Called pt to introduce myself as her Arboriculturist and to discuss the [employer].  Pt has 2 insurances so copay assistance shouldn't be needed.  I offered the [employer], went over what it covers and gave her the income requirement.  Pt stated she exceeds that amount so she doesn't qualify for the grant at this time.  I requested for the registration staff give her my card in case her situation changes and for any questions or concerns she may have in the future.

## 2020-09-04 NOTE — Patient Instructions (Signed)
            [facility]         [address]         [place], [place] [address]         [phone]         Bethann Punches, PT, CLT   After Breast Cancer Class It is recommended you attend the ABC class to be educated on lymphedema risk reduction. This class is free of charge and lasts for 1 hour. It is a 1-time class.  You are scheduled for 09/25/2020 at 11:00. We will send you a link.  Scar massage You can begin gentle scar massage to your incision in your left armpit - a few minutes each day with coconut oil.   Compression garment You need a compression bra that zips up the front. We recommend the [place] bra if you can get it from [place] [employer] [place] at [employer] [phone]. You can also find a similar one at [facility] or [employer].   Home exercise Program Continue doing the exercises given until you can do them without feeling any tightness.   Follow up PT: It is recommended you return every 3 months for the first 3 years following surgery to be assessed on the SOZO machine for an L-Dex score. This helps prevent clinically significant lymphedema in 95% of patients. These follow up screens are 15 minute appointments that you are not billed for. You are scheduled for March 14th at 11:45.  Closed Chain: Shoulder Flexion / Extension - on Wall    Hands on wall, step backward. Return. Stepping causes shoulder flexion and extension Do _5__ times, holding 5-10 seconds, _2__ times per day.  http://ss.exer.us/265   Copyright  VHI. All rights reserved.

## 2020-09-05 ENCOUNTER — Other Ambulatory Visit: Payer: Self-pay

## 2020-09-05 ENCOUNTER — Inpatient Hospital Stay: Payer: Medicare Other

## 2020-09-05 VITALS — BP 150/87 | HR 60 | Temp 97.9°F | Resp 16

## 2020-09-05 DIAGNOSIS — Z171 Estrogen receptor negative status [ER-]: Secondary | ICD-10-CM

## 2020-09-05 DIAGNOSIS — Z5111 Encounter for antineoplastic chemotherapy: Secondary | ICD-10-CM | POA: Diagnosis not present

## 2020-09-05 DIAGNOSIS — C50412 Malignant neoplasm of upper-outer quadrant of left female breast: Secondary | ICD-10-CM

## 2020-09-05 DIAGNOSIS — Z95828 Presence of other vascular implants and grafts: Secondary | ICD-10-CM

## 2020-09-05 LAB — CBC WITH DIFFERENTIAL (CANCER CENTER ONLY)
Abs Immature Granulocytes: 0.01 10*3/uL (ref 0.00–0.07)
Basophils Absolute: 0 10*3/uL (ref 0.0–0.1)
Basophils Relative: 1 %
Eosinophils Absolute: 0.2 10*3/uL (ref 0.0–0.5)
Eosinophils Relative: 3 %
HCT: 37.6 % (ref 36.0–46.0)
Hemoglobin: 12.4 g/dL (ref 12.0–15.0)
Immature Granulocytes: 0 %
Lymphocytes Relative: 39 %
Lymphs Abs: 2 10*3/uL (ref 0.7–4.0)
MCH: 31.6 pg (ref 26.0–34.0)
MCHC: 33 g/dL (ref 30.0–36.0)
MCV: 95.7 fL (ref 80.0–100.0)
Monocytes Absolute: 0.6 10*3/uL (ref 0.1–1.0)
Monocytes Relative: 11 %
Neutro Abs: 2.4 10*3/uL (ref 1.7–7.7)
Neutrophils Relative %: 46 %
Platelet Count: 168 10*3/uL (ref 150–400)
RBC: 3.93 MIL/uL (ref 3.87–5.11)
RDW: 13 % (ref 11.5–15.5)
WBC Count: 5.2 10*3/uL (ref 4.0–10.5)
nRBC: 0 % (ref 0.0–0.2)

## 2020-09-05 LAB — CMP (CANCER CENTER ONLY)
ALT: 18 U/L (ref 0–44)
AST: 21 U/L (ref 15–41)
Albumin: 3.6 g/dL (ref 3.5–5.0)
Alkaline Phosphatase: 63 U/L (ref 38–126)
Anion gap: 3 — ABNORMAL LOW (ref 5–15)
BUN: 17 mg/dL (ref 8–23)
CO2: 28 mmol/L (ref 22–32)
Calcium: 8.8 mg/dL — ABNORMAL LOW (ref 8.9–10.3)
Chloride: 110 mmol/L (ref 98–111)
Creatinine: 0.75 mg/dL (ref 0.44–1.00)
GFR, Estimated: >60 mL/min (ref >60)
Glucose, Bld: 88 mg/dL (ref 70–99)
Potassium: 3.9 mmol/L (ref 3.5–5.1)
Sodium: 141 mmol/L (ref 135–145)
Total Bilirubin: 0.5 mg/dL (ref 0.3–1.2)
Total Protein: 6.3 g/dL — ABNORMAL LOW (ref 6.5–8.1)

## 2020-09-05 MED ORDER — FLUOROURACIL CHEMO INJECTION 2.5 GM/50ML
600.0000 mg/m2 | Freq: Once | INTRAVENOUS | Status: AC
  Administered 2020-09-05: 1050 mg via INTRAVENOUS
  Filled 2020-09-05: qty 21

## 2020-09-05 MED ORDER — SODIUM CHLORIDE 0.9 % IV SOLN
600.0000 mg/m2 | Freq: Once | INTRAVENOUS | Status: AC
  Administered 2020-09-05: 1060 mg via INTRAVENOUS
  Filled 2020-09-05: qty 53

## 2020-09-05 MED ORDER — SODIUM CHLORIDE 0.9% FLUSH
10.0000 mL | INTRAVENOUS | Status: DC | PRN
  Administered 2020-09-05: 10 mL
  Filled 2020-09-05: qty 10

## 2020-09-05 MED ORDER — HEPARIN SOD (PORK) LOCK FLUSH 100 UNIT/ML IV SOLN
500.0000 [IU] | Freq: Once | INTRAVENOUS | Status: AC | PRN
  Administered 2020-09-05: 500 [IU]
  Filled 2020-09-05: qty 5

## 2020-09-05 MED ORDER — METHOTREXATE SODIUM (PF) CHEMO INJECTION 250 MG/10ML
40.0000 mg/m2 | Freq: Once | INTRAMUSCULAR | Status: AC
  Administered 2020-09-05: 70 mg via INTRAVENOUS
  Filled 2020-09-05: qty 2.8

## 2020-09-05 MED ORDER — SODIUM CHLORIDE 0.9 % IV SOLN
10.0000 mg | Freq: Once | INTRAVENOUS | Status: AC
  Administered 2020-09-05: 10 mg via INTRAVENOUS
  Filled 2020-09-05: qty 10

## 2020-09-05 MED ORDER — PALONOSETRON HCL INJECTION 0.25 MG/5ML
INTRAVENOUS | Status: AC
  Filled 2020-09-05: qty 5

## 2020-09-05 MED ORDER — SODIUM CHLORIDE 0.9 % IV SOLN
Freq: Once | INTRAVENOUS | Status: AC
  Filled 2020-09-05: qty 250

## 2020-09-05 MED ORDER — PALONOSETRON HCL INJECTION 0.25 MG/5ML
0.2500 mg | Freq: Once | INTRAVENOUS | Status: AC
  Administered 2020-09-05: 0.25 mg via INTRAVENOUS

## 2020-09-05 MED ORDER — SODIUM CHLORIDE 0.9% FLUSH
10.0000 mL | Freq: Once | INTRAVENOUS | Status: AC
  Administered 2020-09-05: 10 mL via INTRAVENOUS
  Filled 2020-09-05: qty 10

## 2020-09-05 NOTE — Patient Instructions (Signed)
[facility] Discharge Instructions for Patients Receiving Chemotherapy  Today you received the following chemotherapy agents: Cyclophosphamide, Methotrexate, Fluorouracil.    To help prevent nausea and vomiting after your treatment, we encourage you to take your nausea medication as directed.   If you develop nausea and vomiting that is not controlled by your nausea medication, call the clinic.   BELOW ARE SYMPTOMS THAT SHOULD BE REPORTED IMMEDIATELY:  *FEVER GREATER THAN 100.5 F  *CHILLS WITH OR WITHOUT FEVER  NAUSEA AND VOMITING THAT IS NOT CONTROLLED WITH YOUR NAUSEA MEDICATION  *UNUSUAL SHORTNESS OF BREATH  *UNUSUAL BRUISING OR BLEEDING  TENDERNESS IN MOUTH AND THROAT WITH OR WITHOUT PRESENCE OF ULCERS  *URINARY PROBLEMS  *BOWEL PROBLEMS  UNUSUAL RASH Items with * indicate a potential emergency and should be followed up as soon as possible.  Feel free to call the clinic should you have any questions or concerns. The clinic phone number is [phone].  Please show the CHEMO ALERT CARD at check-in to the Emergency Department and triage nurse.  Cyclophosphamide Injection What is this medicine? CYCLOPHOSPHAMIDE (sye kloe FOSS fa mide) is a chemotherapy drug. It slows the growth of cancer cells. This medicine is used to treat many types of cancer like lymphoma, myeloma, leukemia, breast cancer, and ovarian cancer, to name a few. This medicine may be used for other purposes; ask your health care provider or pharmacist if you have questions. COMMON BRAND NAME(S): Cytoxan, Neosar What should I tell my health care provider before I take this medicine? They need to know if you have any of these conditions:  heart disease  history of irregular heartbeat  infection  kidney disease  liver disease  low blood counts, like white cells, platelets, or red blood cells  on hemodialysis  recent or ongoing radiation therapy  scarring or thickening of the  lungs  trouble passing urine  an unusual or allergic reaction to cyclophosphamide, other medicines, foods, dyes, or preservatives  pregnant or trying to get pregnant  breast-feeding How should I use this medicine? This drug is usually given as an injection into a vein or muscle or by infusion into a vein. It is administered in a hospital or clinic by a specially trained health care professional. Talk to your pediatrician regarding the use of this medicine in children. Special care may be needed. Overdosage: If you think you have taken too much of this medicine contact a poison control center or emergency room at once. NOTE: This medicine is only for you. Do not share this medicine with others. What if I miss a dose? It is important not to miss your dose. Call your doctor or health care professional if you are unable to keep an appointment. What may interact with this medicine?  amphotericin B  azathioprine  certain antivirals for HIV or hepatitis  certain medicines for blood pressure, heart disease, irregular heart beat  certain medicines that treat or prevent blood clots like warfarin  certain other medicines for cancer  cyclosporine  etanercept  indomethacin  medicines that relax muscles for surgery  medicines to increase blood counts  metronidazole This list may not describe all possible interactions. Give your health care provider a list of all the medicines, herbs, non-prescription drugs, or dietary supplements you use. Also tell them if you smoke, drink alcohol, or use illegal drugs. Some items may interact with your medicine. What should I watch for while using this medicine? Your condition will be monitored carefully while you are  receiving this medicine. You may need blood work done while you are taking this medicine. Drink water or other fluids as directed. Urinate often, even at night. Some products may contain alcohol. Ask your health care professional if  this medicine contains alcohol. Be sure to tell all health care professionals you are taking this medicine. Certain medicines, like metronidazole and disulfiram, can cause an unpleasant reaction when taken with alcohol. The reaction includes flushing, headache, nausea, vomiting, sweating, and increased thirst. The reaction can last from 30 minutes to several hours. Do not become pregnant while taking this medicine or for 1 year after stopping it. Women should inform their health care professional if they wish to become pregnant or think they might be pregnant. Men should not father a child while taking this medicine and for 4 months after stopping it. There is potential for serious side effects to an unborn child. Talk to your health care professional for more information. Do not breast-feed an infant while taking this medicine or for 1 week after stopping it. This medicine has caused ovarian failure in some women. This medicine may make it more difficult to get pregnant. Talk to your health care professional if you are concerned about your fertility. This medicine has caused decreased sperm counts in some men. This may make it more difficult to father a child. Talk to your health care professional if you are concerned about your fertility. Call your health care professional for advice if you get a fever, chills, or sore throat, or other symptoms of a cold or flu. Do not treat yourself. This medicine decreases your body's ability to fight infections. Try to avoid being around people who are sick. Avoid taking medicines that contain aspirin, acetaminophen, ibuprofen, naproxen, or ketoprofen unless instructed by your health care professional. These medicines may hide a fever. Talk to your health care professional about your risk of cancer. You may be more at risk for certain types of cancer if you take this medicine. If you are going to need surgery or other procedure, tell your health care professional that  you are using this medicine. Be careful brushing or flossing your teeth or using a toothpick because you may get an infection or bleed more easily. If you have any dental work done, tell your dentist you are receiving this medicine. What side effects may I notice from receiving this medicine? Side effects that you should report to your doctor or health care professional as soon as possible:  allergic reactions like skin rash, itching or hives, swelling of the face, lips, or tongue  breathing problems  nausea, vomiting  signs and symptoms of bleeding such as bloody or black, tarry stools; red or dark brown urine; spitting up blood or brown material that looks like coffee grounds; red spots on the skin; unusual bruising or bleeding from the eyes, gums, or nose  signs and symptoms of heart failure like fast, irregular heartbeat, sudden weight gain; swelling of the ankles, feet, hands  signs and symptoms of infection like fever; chills; cough; sore throat; pain or trouble passing urine  signs and symptoms of kidney injury like trouble passing urine or change in the amount of urine  signs and symptoms of liver injury like dark yellow or brown urine; general ill feeling or flu-like symptoms; light-colored stools; loss of appetite; nausea; right upper belly pain; unusually weak or tired; yellowing of the eyes or skin Side effects that usually do not require medical attention (report to your doctor or  health care professional if they continue or are bothersome):  confusion  decreased hearing  diarrhea  facial flushing  hair loss  headache  loss of appetite  missed menstrual periods  signs and symptoms of low red blood cells or anemia such as unusually weak or tired; feeling faint or lightheaded; falls  skin discoloration This list may not describe all possible side effects. Call your doctor for medical advice about side effects. You may report side effects to FDA at  1-800-FDA-1088. Where should I keep my medicine? This drug is given in a hospital or clinic and will not be stored at home. NOTE: This sheet is a summary. It may not cover all possible information. If you have questions about this medicine, talk to your doctor, pharmacist, or health care provider.  2020 [employer]vier/Gold Standard (2019-05-31 09:53:29)  Methotrexate injection What is this medicine? METHOTREXATE (METH oh TREX ate) is a chemotherapy drug used to treat cancer including breast cancer, leukemia, and lymphoma. This medicine can also be used to treat psoriasis and certain kinds of arthritis. This medicine may be used for other purposes; ask your health care provider or pharmacist if you have questions. What should I tell my health care provider before I take this medicine? They need to know if you have any of these conditions:  fluid in the stomach area or lungs  if you often drink alcohol  infection or immune system problems  kidney disease  liver disease  low blood counts, like low white cell, platelet, or red cell counts  lung disease  radiation therapy  stomach ulcers  ulcerative colitis  an unusual or allergic reaction to methotrexate, other medicines, foods, dyes, or preservatives  pregnant or trying to get pregnant  breast-feeding How should I use this medicine? This medicine is for infusion into a vein or for injection into muscle or into the spinal fluid (whichever applies). It is usually given by a health care professional in a hospital or clinic setting. In rare cases, you might get this medicine at home. You will be taught how to give this medicine. Use exactly as directed. Take your medicine at regular intervals. Do not take your medicine more often than directed. If this medicine is used for arthritis or psoriasis, it should be taken weekly, NOT daily. It is important that you put your used needles and syringes in a special sharps container. Do not put  them in a trash can. If you do not have a sharps container, call your pharmacist or healthcare provider to get one. Talk to your pediatrician regarding the use of this medicine in children. While this drug may be prescribed for children as young as 2 years for selected conditions, precautions do apply. Overdosage: If you think you have taken too much of this medicine contact a poison control center or emergency room at once. NOTE: This medicine is only for you. Do not share this medicine with others. What if I miss a dose? It is important not to miss your dose. Call your doctor or health care professional if you are unable to keep an appointment. If you give yourself the medicine and you miss a dose, talk with your doctor or health care professional. Do not take double or extra doses. What may interact with this medicine? This medicine may interact with the following medications:  acitretin  aspirin or aspirin-like medicines including salicylates  azathioprine  certain antibiotics like chloramphenicol, penicillin, tetracycline  certain medicines for stomach problems like esomeprazole, omeprazole, pantoprazole  cyclosporine  gold  hydroxychloroquine  live virus vaccines  mercaptopurine  NSAIDs, medicines for pain and inflammation, like ibuprofen or naproxen  other cytotoxic agents  penicillamine  phenylbutazone  phenytoin  probenacid  retinoids such as isotretinoin and tretinoin  steroid medicines like prednisone or cortisone  sulfonamides like sulfasalazine and trimethoprim/sulfamethoxazole  theophylline This list may not describe all possible interactions. Give your health care provider a list of all the medicines, herbs, non-prescription drugs, or dietary supplements you use. Also tell them if you smoke, drink alcohol, or use illegal drugs. Some items may interact with your medicine. What should I watch for while using this medicine? Avoid alcoholic drinks. In  some cases, you may be given additional medicines to help with side effects. Follow all directions for their use. This medicine can make you more sensitive to the sun. Keep out of the sun. If you cannot avoid being in the sun, wear protective clothing and use sunscreen. Do not use sun lamps or tanning beds/booths. You may get drowsy or dizzy. Do not drive, use machinery, or do anything that needs mental alertness until you know how this medicine affects you. Do not stand or sit up quickly, especially if you are an older patient. This reduces the risk of dizzy or fainting spells. You may need blood work done while you are taking this medicine. Call your doctor or health care professional for advice if you get a fever, chills or sore throat, or other symptoms of a cold or flu. Do not treat yourself. This drug decreases your body's ability to fight infections. Try to avoid being around people who are sick. This medicine may increase your risk to bruise or bleed. Call your doctor or health care professional if you notice any unusual bleeding. Check with your doctor or health care professional if you get an attack of severe diarrhea, nausea and vomiting, or if you sweat a lot. The loss of too much body fluid can make it dangerous for you to take this medicine. Talk to your doctor about your risk of cancer. You may be more at risk for certain types of cancers if you take this medicine. Both men and women must use effective birth control with this medicine. Do not become pregnant while taking this medicine or until at least 1 normal menstrual cycle has occurred after stopping it. Women should inform their doctor if they wish to become pregnant or think they might be pregnant. Men should not father a child while taking this medicine and for 3 months after stopping it. There is a potential for serious side effects to an unborn child. Talk to your health care professional or pharmacist for more information. Do not  breast-feed an infant while taking this medicine. What side effects may I notice from receiving this medicine? Side effects that you should report to your doctor or health care professional as soon as possible:  allergic reactions like skin rash, itching or hives, swelling of the face, lips, or tongue  back pain  breathing problems or shortness of breath  confusion  diarrhea  dry, nonproductive cough  low blood counts - this medicine may decrease the number of white blood cells, red blood cells and platelets. You may be at increased risk of infections and bleeding  mouth sores  redness, blistering, peeling or loosening of the skin, including inside the mouth  seizures  severe headaches  signs of infection - fever or chills, cough, sore throat, pain or difficulty passing urine  signs and symptoms of bleeding such as bloody or black, tarry stools; red or dark-brown urine; spitting up blood or brown material that looks like coffee grounds; red spots on the skin; unusual bruising or bleeding from the eye, gums, or nose  signs and symptoms of kidney injury like trouble passing urine or change in the amount of urine  signs and symptoms of liver injury like dark yellow or brown urine; general ill feeling or flu-like symptoms; light-colored stools; loss of appetite; nausea; right upper belly pain; unusually weak or tired; yellowing of the eyes or skin  stiff neck  vomiting Side effects that usually do not require medical attention (report to your doctor or health care professional if they continue or are bothersome):  dizziness  hair loss  headache  stomach pain  upset stomach This list may not describe all possible side effects. Call your doctor for medical advice about side effects. You may report side effects to FDA at 1-800-FDA-1088. Where should I keep my medicine? If you are using this medicine at home, you will be instructed on how to store this medicine. Throw away  any unused medicine after the expiration date on the label. NOTE: This sheet is a summary. It may not cover all possible information. If you have questions about this medicine, talk to your doctor, pharmacist, or health care provider.  2020 Elsevier/Gold Standard (2017-04-17 13:31:42)  Fluorouracil, 5-FU injection What is this medicine? FLUOROURACIL, 5-FU (flure oh YOOR a sil) is a chemotherapy drug. It slows the growth of cancer cells. This medicine is used to treat many types of cancer like breast cancer, colon or rectal cancer, pancreatic cancer, and stomach cancer. This medicine may be used for other purposes; ask your health care provider or pharmacist if you have questions. COMMON BRAND NAME(S): Adrucil What should I tell my health care provider before I take this medicine? They need to know if you have any of these conditions:  blood disorders  dihydropyrimidine dehydrogenase (DPD) deficiency  infection (especially a virus infection such as chickenpox, cold sores, or herpes)  kidney disease  liver disease  malnourished, poor nutrition  recent or ongoing radiation therapy  an unusual or allergic reaction to fluorouracil, other chemotherapy, other medicines, foods, dyes, or preservatives  pregnant or trying to get pregnant  breast-feeding How should I use this medicine? This drug is given as an infusion or injection into a vein. It is administered in a hospital or clinic by a specially trained health care professional. Talk to your pediatrician regarding the use of this medicine in children. Special care may be needed. Overdosage: If you think you have taken too much of this medicine contact a poison control center or emergency room at once. NOTE: This medicine is only for you. Do not share this medicine with others. What if I miss a dose? It is important not to miss your dose. Call your doctor or health care professional if you are unable to keep an appointment. What may  interact with this medicine?  allopurinol  cimetidine  dapsone  digoxin  hydroxyurea  leucovorin  levamisole  medicines for seizures like ethotoin, fosphenytoin, phenytoin  medicines to increase blood counts like filgrastim, pegfilgrastim, sargramostim  medicines that treat or prevent blood clots like warfarin, enoxaparin, and dalteparin  methotrexate  metronidazole  pyrimethamine  some other chemotherapy drugs like busulfan, cisplatin, estramustine, vinblastine  trimethoprim  trimetrexate  vaccines Talk to your doctor or health care professional before taking any of these medicines:  acetaminophen  aspirin  ibuprofen  ketoprofen  naproxen This list may not describe all possible interactions. Give your health care provider a list of all the medicines, herbs, non-prescription drugs, or dietary supplements you use. Also tell them if you smoke, drink alcohol, or use illegal drugs. Some items may interact with your medicine. What should I watch for while using this medicine? Visit your doctor for checks on your progress. This drug may make you feel generally unwell. This is not uncommon, as chemotherapy can affect healthy cells as well as cancer cells. Report any side effects. Continue your course of treatment even though you feel ill unless your doctor tells you to stop. In some cases, you may be given additional medicines to help with side effects. Follow all directions for their use. Call your doctor or health care professional for advice if you get a fever, chills or sore throat, or other symptoms of a cold or flu. Do not treat yourself. This drug decreases your body's ability to fight infections. Try to avoid being around people who are sick. This medicine may increase your risk to bruise or bleed. Call your doctor or health care professional if you notice any unusual bleeding. Be careful brushing and flossing your teeth or using a toothpick because you may get an  infection or bleed more easily. If you have any dental work done, tell your dentist you are receiving this medicine. Avoid taking products that contain aspirin, acetaminophen, ibuprofen, naproxen, or ketoprofen unless instructed by your doctor. These medicines may hide a fever. Do not become pregnant while taking this medicine. Women should inform their doctor if they wish to become pregnant or think they might be pregnant. There is a potential for serious side effects to an unborn child. Talk to your health care professional or pharmacist for more information. Do not breast-feed an infant while taking this medicine. Men should inform their doctor if they wish to father a child. This medicine may lower sperm counts. Do not treat diarrhea with over the counter products. Contact your doctor if you have diarrhea that lasts more than 2 days or if it is severe and watery. This medicine can make you more sensitive to the sun. Keep out of the sun. If you cannot avoid being in the sun, wear protective clothing and use sunscreen. Do not use sun lamps or tanning beds/booths. What side effects may I notice from receiving this medicine? Side effects that you should report to your doctor or health care professional as soon as possible:  allergic reactions like skin rash, itching or hives, swelling of the face, lips, or tongue  low blood counts - this medicine may decrease the number of white blood cells, red blood cells and platelets. You may be at increased risk for infections and bleeding.  signs of infection - fever or chills, cough, sore throat, pain or difficulty passing urine  signs of decreased platelets or bleeding - bruising, pinpoint red spots on the skin, black, tarry stools, blood in the urine  signs of decreased red blood cells - unusually weak or tired, fainting spells, lightheadedness  breathing problems  changes in vision  chest pain  mouth sores  nausea and vomiting  pain, swelling,  redness at site where injected  pain, tingling, numbness in the hands or feet  redness, swelling, or sores on hands or feet  stomach pain  unusual bleeding Side effects that usually do not require medical attention (report to your doctor or health care professional  if they continue or are bothersome):  changes in finger or toe nails  diarrhea  dry or itchy skin  hair loss  headache  loss of appetite  sensitivity of eyes to the light  stomach upset  unusually teary eyes This list may not describe all possible side effects. Call your doctor for medical advice about side effects. You may report side effects to FDA at 1-800-FDA-1088. Where should I keep my medicine? This drug is given in a hospital or clinic and will not be stored at home. NOTE: This sheet is a summary. It may not cover all possible information. If you have questions about this medicine, talk to your doctor, pharmacist, or health care provider.  2020 Elsevier/Gold Standard (2007-12-30 13:53:16)

## 2020-09-06 ENCOUNTER — Encounter: Payer: Self-pay | Admitting: Hematology and Oncology

## 2020-09-06 ENCOUNTER — Telehealth: Payer: Self-pay | Admitting: *Deleted

## 2020-09-11 ENCOUNTER — Telehealth: Payer: Self-pay

## 2020-09-11 ENCOUNTER — Other Ambulatory Visit: Payer: Self-pay | Admitting: Hematology and Oncology

## 2020-09-11 NOTE — Progress Notes (Signed)
Patient Care Team: Emeterio Reeve, DO as PCP - General (Osteopathic Medicine) Mauro Kaufmann, RN as Oncology Nurse Navigator Rockwell Germany, RN as Oncology Nurse Navigator Stark Klein, MD as Consulting Physician (General Surgery) Nicholas Lose, MD as Consulting Physician (Hematology and Oncology) Eppie Gibson, MD as Attending Physician (Radiation Oncology)  DIAGNOSIS:    ICD-10-CM   1. Malignant neoplasm of upper-outer quadrant of left breast in female, estrogen receptor negative ([place])  C50.412    Z17.1     SUMMARY OF ONCOLOGIC HISTORY: Oncology History  Malignant neoplasm of upper-outer quadrant of left breast in female, estrogen receptor negative ([place])  06/30/2020 Initial Diagnosis   Screening mammogram showed a possible left breast mass. Diagnostic mammogram showed two adjacent masses at the 1 o'clock position, 0.7cm and 0.8cm, about 0.5cm apart, and spanning in total 1.8cm, no left axillary adenopathy. Biopsy showed IDC with squamous differentiation and DCIS, grade 3, HER-2 negative (1+), ER/PR negative, Ki67 80%.    07/05/2020 Cancer Staging   Staging form: Breast, AJCC 8th Edition - Clinical stage from 07/05/2020: Stage IB (cT1c, cN0, cM0, G3, ER-, PR-, HER2-) - Signed by Nicholas Lose, MD on 07/05/2020   07/11/2020 Genetic Testing   Negative genetic testing: no mutations detected in [facility] Common Hereditary Cancers Panel.  The report date is July 11, 2020.   The Common Hereditary Cancers Panel offered by [facility] includes sequencing and/or deletion duplication testing of the following 48 genes: APC, ATM, AXIN2, BARD1, BMPR1A, BRCA1, BRCA2, BRIP1, CDH1, CDK4, CDKN2A (p14ARF), CDKN2A (p16INK4a), CHEK2, CTNNA1, DICER1, EPCAM (Deletion/duplication testing only), GREM1 (promoter region deletion/duplication testing only), KIT, MEN1, MLH1, MSH2, MSH3, MSH6, MUTYH, NBN, NF1, NHTL1, PALB2, PDGFRA, PMS2, POLD1, POLE, PTEN, RAD50, RAD51C, RAD51D, RNF43, SDHB, SDHC, SDHD, SMAD4,  SMARCA4. STK11, TP53, TSC1, TSC2, and VHL.  The following genes were evaluated for sequence changes only: SDHA and HOXB13 c.251G>A variant only.   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   09/05/2020 -  Chemotherapy   The patient had palonosetron (ALOXI) injection 0.25 mg, 0.25 mg, Intravenous,  Once, 1 of 6 cycles Administration: 0.25 mg (09/05/2020) methotrexate (PF) chemo injection 70 mg, 40 mg/m2 = 70 mg, Intravenous,  Once, 1 of 6 cycles Administration: 70 mg (09/05/2020) cyclophosphamide (CYTOXAN) 1,060 mg in sodium chloride 0.9 % 250 mL chemo infusion, 600 mg/m2 = 1,060 mg, Intravenous,  Once, 1 of 6 cycles Administration: 1,060 mg (09/05/2020) fluorouracil (ADRUCIL) chemo injection 1,050 mg, 600 mg/m2 = 1,050 mg, Intravenous,  Once, 1 of 6 cycles Administration: 1,050 mg (09/05/2020)  for chemotherapy treatment.      CHIEF COMPLIANT: Cycle 1 Day 8 CMF  INTERVAL HISTORY: Cindy [name] is a 75 y.o. with above-mentioned history of invasive ductal carcinoma of the left breast who underwent a left lumpectomy and is currently on adjuvant chemotherapy with CMF. She presents to the clinic today for a toxicity check following cycle 1.  She tolerated chemotherapy extremely well.  She does have mild nausea for which she took Compazine for couple of days.  She does have fatigue and shortness of breath with exertion.  Denies any chest pain.  ALLERGIES:  is allergic to sulfamethoxazole-trimethoprim and tetracyclines & related.  MEDICATIONS:  Current Outpatient Medications  Medication Sig Dispense Refill  . acetaminophen (TYLENOL) 500 MG tablet Take 1,000 mg by mouth every 6 (six) hours as needed.    . celecoxib (  CELEBREX) 200 MG capsule One to 2 tablets by mouth daily as needed for pain. (Patient taking differently: Take 200-400 mg by mouth  daily as needed for moderate pain. ) 180 capsule 3  . cetirizine (ZYRTEC) 10 MG tablet Take 10 mg by mouth daily as needed for allergies.    . cholecalciferol (VITAMIN D3) 25 MCG (1000 UNIT) tablet Take 2,000 Units by mouth daily.    . cycloSPORINE (RESTASIS) 0.05 % ophthalmic emulsion Place 1 drop into both eyes 2 (two) times daily as needed (dry eyes).    Marland Kitchen denosumab (PROLIA) 60 MG/ML SOSY injection Inject into the skin every 6 (six) months.    . diclofenac sodium (VOLTAREN) 1 % GEL Apply 4 g topically 4 (four) times daily. (Patient taking differently: Apply 2 g topically 4 (four) times daily as needed (pain). ) 100 g 11  . escitalopram (LEXAPRO) 10 MG tablet Take 5 mg by mouth daily.    Marland Kitchen esomeprazole (NEXIUM) 40 MG capsule Take 1 capsule by mouth once daily 90 capsule 0  . hydrochlorothiazide (HYDRODIURIL) 25 MG tablet Take 1 tablet (25 mg total) by mouth daily. As need for swelling (Patient taking differently: Take 25 mg by mouth daily as needed (swelling). ) 90 tablet 0  . levothyroxine (SYNTHROID) 75 MCG tablet Take 1 tablet (75 mcg total) by mouth daily before breakfast. Take on Days 1-6 and skip Day 7- NEED ANNUAL EXAM AND LABS (Patient taking differently: Take 75 mcg by mouth See admin instructions. Take on Days 1-6 (Mon - Sat) and skip Day 7 (Sundays) - NEED ANNUAL EXAM AND LABS) 90 tablet 2  . lidocaine-prilocaine (EMLA) cream Apply to affected area once 30 g 3  . montelukast (SINGULAIR) 10 MG tablet Take 1 tablet (10 mg total) by mouth at bedtime. (Patient taking differently: Take 10 mg by mouth at bedtime as needed (allergies). ) 90 tablet 1  . Multiple Vitamins-Minerals (THRIVE FOR LIFE WOMENS PO) Take 1 tablet by mouth every morning. Also Thrive patch and Thrive milkshake daily    . NIFEdipine (PROCARDIA-XL/NIFEDICAL-XL) 30 MG 24 hr tablet Take 1 tablet by mouth once daily (Patient taking differently: Take 30 mg by mouth daily. ) 90 tablet 2  . nitroGLYCERIN (NITROSTAT) 0.4 MG SL tablet  Place 1 tablet (0.4 mg total) under the tongue every 5 (five) minutes as needed for chest pain. 20 tablet 1  . ondansetron (ZOFRAN) 8 MG tablet Take 1 tablet (8 mg total) by mouth 2 (two) times daily as needed for refractory nausea / vomiting. Start on day 3 after chemotherapy. 30 tablet 1  . Probiotic Product (PROBIOTIC PO) Take 1 capsule by mouth daily.    . prochlorperazine (COMPAZINE) 10 MG tablet Take 1 tablet (10 mg total) by mouth every 6 (six) hours as needed (Nausea or vomiting). 30 tablet 1  . rosuvastatin (CRESTOR) 20 MG tablet Take 1 tablet (20 mg total) by mouth daily. 90 tablet 3  . SUMAtriptan (IMITREX) 100 MG tablet Take 0.5-1 tablets (50-100 mg total) by mouth every 2 (two) hours as needed for migraine (Max 2 pills per 24 hours). 10 tablet 3  . vitamin B-12 (CYANOCOBALAMIN) 500 MCG tablet Take 500 mcg by mouth daily.      No current facility-administered medications for this visit.    PHYSICAL EXAMINATION: ECOG PERFORMANCE STATUS: 1 - Symptomatic but completely ambulatory  There were no vitals filed for this visit. There were no vitals filed for this visit.     LABORATORY DATA:  I have reviewed the data as listed CMP Latest Ref Rng & Units 09/05/2020 08/08/2020 07/05/2020  Glucose 70 - 99 mg/dL 88 89 98  BUN 8 - 23 mg/dL 17 16 24(H)  Creatinine 0.44 - 1.00 mg/dL 0.75 0.85 1.12(H)  Sodium 135 - 145 mmol/L 141 138 141  Potassium 3.5 - 5.1 mmol/L 3.9 3.8 3.9  Chloride 98 - 111 mmol/L 110 106 103  CO2 22 - 32 mmol/L 28 23 31   Calcium 8.9 - 10.3 mg/dL 8.8(L) 8.6(L) 9.2  Total Protein 6.5 - 8.1 g/dL 6.3(L) - 7.0  Total Bilirubin 0.3 - 1.2 mg/dL 0.5 - 0.7  Alkaline Phos 38 - 126 U/L 63 - 64  AST 15 - 41 U/L 21 - 22  ALT 0 - 44 U/L 18 - 19    Lab Results  Component Value Date   WBC 4.1 09/12/2020   HGB 12.6 09/12/2020   HCT 37.9 09/12/2020   MCV 94.0 09/12/2020   PLT 129 (L) 09/12/2020   NEUTROABS 2.4 09/12/2020    ASSESSMENT & PLAN:  Malignant neoplasm of  upper-outer quadrant of left breast in female, estrogen receptor negative ([place]) 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  ER/PR negative, Ki-67 80%, HER-2 negative  Treatment plan: 1. adjuvant chemotherapywith CMF x6 cycles plan to start treatment 09/05/2020 2.Follow-up adjuvant radiation ------------------------------------------------------------------------------------------------------------------------------------------------------ Current treatment: Cycle 1 day 8 CMF Labs have been reviewed  Chemo toxicities: 1.  Mild nausea: Improved with Compazine 2. shortness of breath to exertion: Unclear etiology.  If it persists or gets worse, we might have to do a CT chest to rule out PE.  At this time the probability of PE is low and therefore we decided to watch and monitor.  I suspect that the shortness of breath could be related to fatigue from chemo.  Return to clinic in 2 weeks for cycle 2.    No orders of the defined types were placed in this encounter.  The patient has a good understanding of the overall plan. she agrees with it. she will call with any problems that may develop before the next visit here.  Total time spent: 30 mins including face to face time and time spent for planning, charting and coordination of care  Nicholas Lose, MD 09/12/2020  I, Cloyde Reams Dorshimer, am acting as scribe for Dr. Nicholas Lose.  I have reviewed the above documentation for accuracy and completeness, and I agree with the above.

## 2020-09-11 NOTE — Telephone Encounter (Signed)
[employer] called and left a message stating the Dexa scan was not covered. I looked at the coding and it should have been the osteoporosis diagnosis only. It also has a Medicare Wellness diagnosis attached. I am not sure who can correct this. Sending to [place].

## 2020-09-12 ENCOUNTER — Inpatient Hospital Stay: Payer: Medicare Other

## 2020-09-12 ENCOUNTER — Encounter: Payer: Self-pay | Admitting: *Deleted

## 2020-09-12 ENCOUNTER — Other Ambulatory Visit: Payer: Self-pay

## 2020-09-12 ENCOUNTER — Inpatient Hospital Stay: Payer: Medicare Other | Attending: Hematology and Oncology | Admitting: Hematology and Oncology

## 2020-09-12 DIAGNOSIS — C50412 Malignant neoplasm of upper-outer quadrant of left female breast: Secondary | ICD-10-CM

## 2020-09-12 DIAGNOSIS — M810 Age-related osteoporosis without current pathological fracture: Secondary | ICD-10-CM

## 2020-09-12 DIAGNOSIS — Z171 Estrogen receptor negative status [ER-]: Secondary | ICD-10-CM | POA: Diagnosis not present

## 2020-09-12 DIAGNOSIS — Z5111 Encounter for antineoplastic chemotherapy: Secondary | ICD-10-CM | POA: Insufficient documentation

## 2020-09-12 DIAGNOSIS — Z95828 Presence of other vascular implants and grafts: Secondary | ICD-10-CM

## 2020-09-12 LAB — CBC WITH DIFFERENTIAL (CANCER CENTER ONLY)
Abs Immature Granulocytes: 0 10*3/uL (ref 0.00–0.07)
Basophils Absolute: 0 10*3/uL (ref 0.0–0.1)
Basophils Relative: 1 %
Eosinophils Absolute: 0.1 10*3/uL (ref 0.0–0.5)
Eosinophils Relative: 3 %
HCT: 37.9 % (ref 36.0–46.0)
Hemoglobin: 12.6 g/dL (ref 12.0–15.0)
Immature Granulocytes: 0 %
Lymphocytes Relative: 32 %
Lymphs Abs: 1.3 10*3/uL (ref 0.7–4.0)
MCH: 31.3 pg (ref 26.0–34.0)
MCHC: 33.2 g/dL (ref 30.0–36.0)
MCV: 94 fL (ref 80.0–100.0)
Monocytes Absolute: 0.2 10*3/uL (ref 0.1–1.0)
Monocytes Relative: 5 %
Neutro Abs: 2.4 10*3/uL (ref 1.7–7.7)
Neutrophils Relative %: 59 %
Platelet Count: 129 10*3/uL — ABNORMAL LOW (ref 150–400)
RBC: 4.03 MIL/uL (ref 3.87–5.11)
RDW: 12.2 % (ref 11.5–15.5)
WBC Count: 4.1 10*3/uL (ref 4.0–10.5)
nRBC: 0 % (ref 0.0–0.2)

## 2020-09-12 LAB — CMP (CANCER CENTER ONLY)
ALT: 13 U/L (ref 0–44)
AST: 15 U/L (ref 15–41)
Albumin: 3.7 g/dL (ref 3.5–5.0)
Alkaline Phosphatase: 66 U/L (ref 38–126)
Anion gap: 6 (ref 5–15)
BUN: 15 mg/dL (ref 8–23)
CO2: 26 mmol/L (ref 22–32)
Calcium: 9 mg/dL (ref 8.9–10.3)
Chloride: 105 mmol/L (ref 98–111)
Creatinine: 0.87 mg/dL (ref 0.44–1.00)
GFR, Estimated: >60 mL/min (ref >60)
Glucose, Bld: 101 mg/dL — ABNORMAL HIGH (ref 70–99)
Potassium: 4.6 mmol/L (ref 3.5–5.1)
Sodium: 137 mmol/L (ref 135–145)
Total Bilirubin: 0.8 mg/dL (ref 0.3–1.2)
Total Protein: 6.4 g/dL — ABNORMAL LOW (ref 6.5–8.1)

## 2020-09-12 MED ORDER — HEPARIN SOD (PORK) LOCK FLUSH 100 UNIT/ML IV SOLN
500.0000 [IU] | Freq: Once | INTRAVENOUS | Status: AC
  Administered 2020-09-12: 500 [IU]
  Filled 2020-09-12: qty 5

## 2020-09-12 MED ORDER — SODIUM CHLORIDE 0.9% FLUSH
10.0000 mL | Freq: Once | INTRAVENOUS | Status: AC
Start: 2020-09-12 — End: 2020-09-12
  Administered 2020-09-12: 10 mL
  Filled 2020-09-12: qty 10

## 2020-09-12 MED ORDER — DENOSUMAB 60 MG/ML ~~LOC~~ SOSY
60.0000 mg | PREFILLED_SYRINGE | Freq: Once | SUBCUTANEOUS | Status: AC
  Administered 2020-09-12: 60 mg via SUBCUTANEOUS

## 2020-09-12 MED ORDER — DENOSUMAB 60 MG/ML ~~LOC~~ SOSY
PREFILLED_SYRINGE | SUBCUTANEOUS | Status: AC
  Filled 2020-09-12: qty 1

## 2020-09-12 NOTE — Assessment & Plan Note (Signed)
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  ER/PR negative, Ki-67 80%, HER-2 negative  Treatment plan: 1. adjuvant chemotherapywith CMF x6 cycles plan to start treatment 09/05/2020 2.Follow-up adjuvant radiation ------------------------------------------------------------------------------------------------------------------------------------------------------ Current treatment: Cycle 1 day 1 CMF Labs have been reviewed Chemo consent obtained, chemo education completed Antiemetics were reviewed.  Return to clinic in 1 week for toxicity evaluation

## 2020-09-15 ENCOUNTER — Encounter: Payer: Self-pay | Admitting: Osteopathic Medicine

## 2020-09-15 NOTE — Telephone Encounter (Signed)
Can this be resubmitted with different codes?

## 2020-09-19 ENCOUNTER — Other Ambulatory Visit: Payer: Self-pay | Admitting: Hematology and Oncology

## 2020-09-19 ENCOUNTER — Telehealth: Payer: Self-pay | Admitting: *Deleted

## 2020-09-19 ENCOUNTER — Encounter: Payer: Self-pay | Admitting: Hematology and Oncology

## 2020-09-19 MED ORDER — DEXAMETHASONE 0.1 % OP SUSP: 1.0000 [drp] | Freq: Two times a day (BID) | OPHTHALMIC | 0 refills | Status: AC

## 2020-09-19 NOTE — Progress Notes (Signed)
Erythema of the eye: Sent a prescription for dexamethasone eyedrops

## 2020-09-19 NOTE — Telephone Encounter (Signed)
Received call from pt with complaint of left eye irration.  Pt states she noticed her left eye watering last night and when she woke up this morning it was red and irritated.  Pt denies itching or any purulent drainage.  MD notified and states prescription for steroid eyedrop will be called into pharmacy on file. Pt verbalized understanding.

## 2020-09-25 NOTE — Progress Notes (Signed)
Patient Care Team: Emeterio Reeve, DO as PCP - General (Osteopathic Medicine) Mauro Kaufmann, RN as Oncology Nurse Navigator Rockwell Germany, RN as Oncology Nurse Navigator Stark Klein, MD as Consulting Physician (General Surgery) Nicholas Lose, MD as Consulting Physician (Hematology and Oncology) Eppie Gibson, MD as Attending Physician (Radiation Oncology)  DIAGNOSIS:    ICD-10-CM   1. Malignant neoplasm of upper-outer quadrant of left breast in female, estrogen receptor negative ([place])  C50.412    Z17.1     SUMMARY OF ONCOLOGIC HISTORY: Oncology History  Malignant neoplasm of upper-outer quadrant of left breast in female, estrogen receptor negative ([place])  06/30/2020 Initial Diagnosis   Screening mammogram showed a possible left breast mass. Diagnostic mammogram showed two adjacent masses at the 1 o'clock position, 0.7cm and 0.8cm, about 0.5cm apart, and spanning in total 1.8cm, no left axillary adenopathy. Biopsy showed IDC with squamous differentiation and DCIS, grade 3, HER-2 negative (1+), ER/PR negative, Ki67 80%.    07/05/2020 Cancer Staging   Staging form: Breast, AJCC 8th Edition - Clinical stage from 07/05/2020: Stage IB (cT1c, cN0, cM0, G3, ER-, PR-, HER2-) - Signed by Nicholas Lose, MD on 07/05/2020   07/11/2020 Genetic Testing   Negative genetic testing: no mutations detected in [facility] Common Hereditary Cancers Panel.  The report date is July 11, 2020.   The Common Hereditary Cancers Panel offered by [facility] includes sequencing and/or deletion duplication testing of the following 48 genes: APC, ATM, AXIN2, BARD1, BMPR1A, BRCA1, BRCA2, BRIP1, CDH1, CDK4, CDKN2A (p14ARF), CDKN2A (p16INK4a), CHEK2, CTNNA1, DICER1, EPCAM (Deletion/duplication testing only), GREM1 (promoter region deletion/duplication testing only), KIT, MEN1, MLH1, MSH2, MSH3, MSH6, MUTYH, NBN, NF1, NHTL1, PALB2, PDGFRA, PMS2, POLD1, POLE, PTEN, RAD50, RAD51C, RAD51D, RNF43, SDHB, SDHC, SDHD, SMAD4,  SMARCA4. STK11, TP53, TSC1, TSC2, and VHL.  The following genes were evaluated for sequence changes only: SDHA and HOXB13 c.251G>A variant only.   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   09/05/2020 -  Chemotherapy    Patient is on Treatment Plan: BREAST ADJUVANT CMF IV Q21D        CHIEF COMPLIANT: Cycle 2 Day 1 CMF  INTERVAL HISTORY: Cindy [name] is a 75 y.o. with above-mentioned history of invasive ductal carcinoma of the left breast who underwent a left lumpectomy and is currently on adjuvant chemotherapy with CMF. She presents to the clinic todayfor a toxicity check and cycle 2. she reports to me that the shortness of breath has resolved.  She has been eating well and does not have any new problems or concerns.  Denies any nausea or vomiting.  ALLERGIES:  is allergic to sulfamethoxazole-trimethoprim and tetracyclines & related.  MEDICATIONS:  Current Outpatient Medications  Medication Sig Dispense Refill  . acetaminophen (TYLENOL) 500 MG tablet Take 1,000 mg by mouth every 6 (six) hours as needed.    . celecoxib (CELEBREX) 200 MG capsule One to 2 tablets by mouth daily as needed for pain. (Patient taking differently: Take 200-400 mg by mouth daily as needed for moderate pain. ) 180 capsule 3  . cetirizine (ZYRTEC) 10 MG tablet Take 10 mg by mouth daily as needed for allergies.    . cholecalciferol (VITAMIN D3) 25 MCG (1000 UNIT) tablet Take 2,000 Units by mouth daily.    . cycloSPORINE (RESTASIS) 0.05 % ophthalmic emulsion Place 1 drop into both eyes 2 (two) times daily as needed (dry  eyes).    . denosumab (PROLIA) 60 MG/ML SOSY injection Inject into the skin every 6 (six) months.    . dexamethasone (DECADRON) 0.1 % ophthalmic suspension Place 1 drop into both eyes 2 (two) times daily for 7 days. 15 mL 0  . diclofenac sodium  (VOLTAREN) 1 % GEL Apply 4 g topically 4 (four) times daily. (Patient taking differently: Apply 2 g topically 4 (four) times daily as needed (pain). ) 100 g 11  . escitalopram (LEXAPRO) 10 MG tablet Take 5 mg by mouth daily.    Marland Kitchen esomeprazole (NEXIUM) 40 MG capsule Take 1 capsule by mouth once daily 90 capsule 0  . hydrochlorothiazide (HYDRODIURIL) 25 MG tablet Take 1 tablet (25 mg total) by mouth daily. As need for swelling (Patient taking differently: Take 25 mg by mouth daily as needed (swelling). ) 90 tablet 0  . levothyroxine (SYNTHROID) 75 MCG tablet Take 1 tablet (75 mcg total) by mouth daily before breakfast. Take on Days 1-6 and skip Day 7- NEED ANNUAL EXAM AND LABS (Patient taking differently: Take 75 mcg by mouth See admin instructions. Take on Days 1-6 (Mon - Sat) and skip Day 7 (Sundays) - NEED ANNUAL EXAM AND LABS) 90 tablet 2  . lidocaine-prilocaine (EMLA) cream Apply to affected area once 30 g 3  . montelukast (SINGULAIR) 10 MG tablet Take 1 tablet (10 mg total) by mouth at bedtime. (Patient taking differently: Take 10 mg by mouth at bedtime as needed (allergies). ) 90 tablet 1  . Multiple Vitamins-Minerals (THRIVE FOR LIFE WOMENS PO) Take 1 tablet by mouth every morning. Also Thrive patch and Thrive milkshake daily    . NIFEdipine (PROCARDIA-XL/NIFEDICAL-XL) 30 MG 24 hr tablet Take 1 tablet by mouth once daily (Patient taking differently: Take 30 mg by mouth daily. ) 90 tablet 2  . nitroGLYCERIN (NITROSTAT) 0.4 MG SL tablet Place 1 tablet (0.4 mg total) under the tongue every 5 (five) minutes as needed for chest pain. 20 tablet 1  . ondansetron (ZOFRAN) 8 MG tablet Take 1 tablet (8 mg total) by mouth 2 (two) times daily as needed for refractory nausea / vomiting. Start on day 3 after chemotherapy. 30 tablet 1  . Probiotic Product (PROBIOTIC PO) Take 1 capsule by mouth daily.    . prochlorperazine (COMPAZINE) 10 MG tablet Take 1 tablet (10 mg total) by mouth every 6 (six) hours as needed  (Nausea or vomiting). 30 tablet 1  . rosuvastatin (CRESTOR) 20 MG tablet Take 1 tablet (20 mg total) by mouth daily. 90 tablet 3  . SUMAtriptan (IMITREX) 100 MG tablet Take 0.5-1 tablets (50-100 mg total) by mouth every 2 (two) hours as needed for migraine (Max 2 pills per 24 hours). 10 tablet 3  . vitamin B-12 (CYANOCOBALAMIN) 500 MCG tablet Take 500 mcg by mouth daily.      No current facility-administered medications for this visit.   Facility-Administered Medications Ordered in Other Visits  Medication Dose Route Frequency Provider Last Rate Last Admin  . cyclophosphamide (CYTOXAN) 1,060 mg in sodium chloride 0.9 % 250 mL chemo infusion  600 mg/m2 (Treatment Plan Recorded) Intravenous Once Nicholas Lose, MD      . dexamethasone (DECADRON) 10 mg in sodium chloride 0.9 % 50 mL IVPB  10 mg Intravenous Once Nicholas Lose, MD      . fluorouracil (ADRUCIL) chemo injection 1,050 mg  600 mg/m2 (Treatment Plan Recorded) Intravenous Once Nicholas Lose, MD      . heparin lock flush 100 unit/mL  500 Units Intracatheter Once PRN Nicholas Lose, MD      . methotrexate (PF) chemo injection 70 mg  40 mg/m2 (Treatment Plan Recorded) Intravenous Once Nicholas Lose, MD      . palonosetron (ALOXI) injection 0.25 mg  0.25 mg Intravenous Once Nicholas Lose, MD      . sodium chloride flush (NS) 0.9 % injection 10 mL  10 mL Intracatheter PRN Nicholas Lose, MD        PHYSICAL EXAMINATION: ECOG PERFORMANCE STATUS: 1 - Symptomatic but completely ambulatory  Vitals:   09/26/20 1238  BP: 117/74  Pulse: 85  Resp: 15  Temp: (!) 97.3 F (36.3 C)  SpO2: 96%   Filed Weights   09/26/20 1238  Weight: 150 lb 12.8 oz (68.4 kg)    LABORATORY DATA:  I have reviewed the data as listed CMP Latest Ref Rng & Units 09/26/2020 09/12/2020 09/05/2020  Glucose 70 - 99 mg/dL 119(H) 101(H) 88  BUN 8 - 23 mg/dL 18 15 17   Creatinine 0.44 - 1.00 mg/dL 1.01(H) 0.87 0.75  Sodium 135 - 145 mmol/L 141 137 141  Potassium 3.5 - 5.1  mmol/L 4.2 4.6 3.9  Chloride 98 - 111 mmol/L 108 105 110  CO2 22 - 32 mmol/L 25 26 28   Calcium 8.9 - 10.3 mg/dL 8.8(L) 9.0 8.8(L)  Total Protein 6.5 - 8.1 g/dL 6.5 6.4(L) 6.3(L)  Total Bilirubin 0.3 - 1.2 mg/dL 0.4 0.8 0.5  Alkaline Phos 38 - 126 U/L 89 66 63  AST 15 - 41 U/L 22 15 21   ALT 0 - 44 U/L 18 13 18     Lab Results  Component Value Date   WBC 4.0 09/26/2020   HGB 13.3 09/26/2020   HCT 39.7 09/26/2020   MCV 95.0 09/26/2020   PLT 198 09/26/2020   NEUTROABS 1.5 (L) 09/26/2020    ASSESSMENT & PLAN:  Malignant neoplasm of upper-outer quadrant of left breast in female, estrogen receptor negative ([place]) 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  Mild nausea: Improved with Compazine 2. shortness of breath to exertion: Unclear etiology.  Resolved.  Most likely it is from fatigue related to chemotherapy.  Return to clinic in 3 weeks for cycle 3.    No orders of the defined types were placed in this encounter.  The patient has a good understanding of the overall plan. she agrees with it. she will call with any problems that may develop before the next visit here.  Total time spent: 30 mins including face to face time and time spent for planning, charting and coordination of care  Nicholas Lose, MD 09/26/2020  I, Cloyde Reams Dorshimer, am acting as scribe for Dr. Nicholas Lose.  I have reviewed the above documentation for accuracy and completeness,  and I agree with the above.

## 2020-09-26 ENCOUNTER — Inpatient Hospital Stay: Payer: Medicare Other

## 2020-09-26 ENCOUNTER — Other Ambulatory Visit: Payer: Self-pay

## 2020-09-26 ENCOUNTER — Inpatient Hospital Stay (HOSPITAL_BASED_OUTPATIENT_CLINIC_OR_DEPARTMENT_OTHER): Payer: Medicare Other | Admitting: Hematology and Oncology

## 2020-09-26 DIAGNOSIS — C50412 Malignant neoplasm of upper-outer quadrant of left female breast: Secondary | ICD-10-CM | POA: Diagnosis not present

## 2020-09-26 DIAGNOSIS — M810 Age-related osteoporosis without current pathological fracture: Secondary | ICD-10-CM | POA: Diagnosis not present

## 2020-09-26 DIAGNOSIS — Z171 Estrogen receptor negative status [ER-]: Secondary | ICD-10-CM | POA: Diagnosis not present

## 2020-09-26 DIAGNOSIS — Z95828 Presence of other vascular implants and grafts: Secondary | ICD-10-CM

## 2020-09-26 DIAGNOSIS — Z5111 Encounter for antineoplastic chemotherapy: Secondary | ICD-10-CM | POA: Diagnosis not present

## 2020-09-26 LAB — CBC WITH DIFFERENTIAL (CANCER CENTER ONLY)
Abs Immature Granulocytes: 0.02 10*3/uL (ref 0.00–0.07)
Basophils Absolute: 0.1 10*3/uL (ref 0.0–0.1)
Basophils Relative: 2 %
Eosinophils Absolute: 0.1 10*3/uL (ref 0.0–0.5)
Eosinophils Relative: 2 %
HCT: 39.7 % (ref 36.0–46.0)
Hemoglobin: 13.3 g/dL (ref 12.0–15.0)
Immature Granulocytes: 1 %
Lymphocytes Relative: 35 %
Lymphs Abs: 1.4 10*3/uL (ref 0.7–4.0)
MCH: 31.8 pg (ref 26.0–34.0)
MCHC: 33.5 g/dL (ref 30.0–36.0)
MCV: 95 fL (ref 80.0–100.0)
Monocytes Absolute: 0.9 10*3/uL (ref 0.1–1.0)
Monocytes Relative: 23 %
Neutro Abs: 1.5 10*3/uL — ABNORMAL LOW (ref 1.7–7.7)
Neutrophils Relative %: 37 %
Platelet Count: 198 10*3/uL (ref 150–400)
RBC: 4.18 MIL/uL (ref 3.87–5.11)
RDW: 13.2 % (ref 11.5–15.5)
WBC Count: 4 10*3/uL (ref 4.0–10.5)
nRBC: 0 % (ref 0.0–0.2)

## 2020-09-26 LAB — CMP (CANCER CENTER ONLY)
ALT: 18 U/L (ref 0–44)
AST: 22 U/L (ref 15–41)
Albumin: 3.7 g/dL (ref 3.5–5.0)
Alkaline Phosphatase: 89 U/L (ref 38–126)
Anion gap: 8 (ref 5–15)
BUN: 18 mg/dL (ref 8–23)
CO2: 25 mmol/L (ref 22–32)
Calcium: 8.8 mg/dL — ABNORMAL LOW (ref 8.9–10.3)
Chloride: 108 mmol/L (ref 98–111)
Creatinine: 1.01 mg/dL — ABNORMAL HIGH (ref 0.44–1.00)
GFR, Estimated: 58 mL/min — ABNORMAL LOW (ref >60)
Glucose, Bld: 119 mg/dL — ABNORMAL HIGH (ref 70–99)
Potassium: 4.2 mmol/L (ref 3.5–5.1)
Sodium: 141 mmol/L (ref 135–145)
Total Bilirubin: 0.4 mg/dL (ref 0.3–1.2)
Total Protein: 6.5 g/dL (ref 6.5–8.1)

## 2020-09-26 MED ORDER — PALONOSETRON HCL INJECTION 0.25 MG/5ML
INTRAVENOUS | Status: AC
  Filled 2020-09-26: qty 5

## 2020-09-26 MED ORDER — SODIUM CHLORIDE 0.9 % IV SOLN
10.0000 mg | Freq: Once | INTRAVENOUS | Status: AC
  Administered 2020-09-26: 10 mg via INTRAVENOUS
  Filled 2020-09-26: qty 10

## 2020-09-26 MED ORDER — HEPARIN SOD (PORK) LOCK FLUSH 100 UNIT/ML IV SOLN
500.0000 [IU] | Freq: Once | INTRAVENOUS | Status: AC | PRN
  Administered 2020-09-26: 500 [IU]
  Filled 2020-09-26: qty 5

## 2020-09-26 MED ORDER — PALONOSETRON HCL INJECTION 0.25 MG/5ML
0.2500 mg | Freq: Once | INTRAVENOUS | Status: AC
  Administered 2020-09-26: 0.25 mg via INTRAVENOUS

## 2020-09-26 MED ORDER — FLUOROURACIL CHEMO INJECTION 2.5 GM/50ML
600.0000 mg/m2 | Freq: Once | INTRAVENOUS | Status: AC
  Administered 2020-09-26: 1050 mg via INTRAVENOUS
  Filled 2020-09-26: qty 21

## 2020-09-26 MED ORDER — SODIUM CHLORIDE 0.9 % IV SOLN
Freq: Once | INTRAVENOUS | Status: AC
  Filled 2020-09-26: qty 250

## 2020-09-26 MED ORDER — SODIUM CHLORIDE 0.9% FLUSH
10.0000 mL | INTRAVENOUS | Status: DC | PRN
  Administered 2020-09-26: 10 mL
  Filled 2020-09-26: qty 10

## 2020-09-26 MED ORDER — SODIUM CHLORIDE 0.9 % IV SOLN
600.0000 mg/m2 | Freq: Once | INTRAVENOUS | Status: AC
  Administered 2020-09-26: 1060 mg via INTRAVENOUS
  Filled 2020-09-26: qty 53

## 2020-09-26 MED ORDER — METHOTREXATE SODIUM (PF) CHEMO INJECTION 250 MG/10ML
40.0000 mg/m2 | Freq: Once | INTRAMUSCULAR | Status: AC
  Administered 2020-09-26: 70 mg via INTRAVENOUS
  Filled 2020-09-26: qty 2.8

## 2020-09-26 MED ORDER — SODIUM CHLORIDE 0.9% FLUSH
10.0000 mL | Freq: Once | INTRAVENOUS | Status: AC
  Administered 2020-09-26: 10 mL
  Filled 2020-09-26: qty 10

## 2020-09-26 NOTE — Assessment & Plan Note (Signed)
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  Mild nausea: Improved with Compazine 2. shortness of breath to exertion: Unclear etiology.  If it persists or gets worse, we might have to do a CT chest to rule out PE.  At this time the probability of PE is low and therefore we decided to watch and monitor.  I suspect that the shortness of breath could be related to fatigue from chemo.  Return to clinic in 3 weeks for cycle 3.

## 2020-09-26 NOTE — Patient Instructions (Signed)
[facility] Discharge Instructions for Patients Receiving Chemotherapy  Today you received the following chemotherapy agents Cyclophosphamide, Methotrexate, Fluorouricil   To help prevent nausea and vomiting after your treatment, we encourage you to take your nausea medication as directed  If you develop nausea and vomiting that is not controlled by your nausea medication, call the clinic.   BELOW ARE SYMPTOMS THAT SHOULD BE REPORTED IMMEDIATELY:  *FEVER GREATER THAN 100.5 F  *CHILLS WITH OR WITHOUT FEVER  NAUSEA AND VOMITING THAT IS NOT CONTROLLED WITH YOUR NAUSEA MEDICATION  *UNUSUAL SHORTNESS OF BREATH  *UNUSUAL BRUISING OR BLEEDING  TENDERNESS IN MOUTH AND THROAT WITH OR WITHOUT PRESENCE OF ULCERS  *URINARY PROBLEMS  *BOWEL PROBLEMS  UNUSUAL RASH Items with * indicate a potential emergency and should be followed up as soon as possible.  Feel free to call the clinic should you have any questions or concerns. The clinic phone number is [phone].  Please show the CHEMO ALERT CARD at check-in to the Emergency Department and triage nurse.   

## 2020-09-28 ENCOUNTER — Telehealth: Payer: Self-pay | Admitting: Hematology and Oncology

## 2020-09-28 NOTE — Telephone Encounter (Signed)
Scheduled per 1/18 los. Pt will receive an updated appt calendar at next visit per appt notes

## 2020-10-16 NOTE — Progress Notes (Incomplete)
Patient Care Team: Emeterio Reeve, DO as PCP - General (Osteopathic Medicine) Mauro Kaufmann, RN as Oncology Nurse Navigator Rockwell Germany, RN as Oncology Nurse Navigator Stark Klein, MD as Consulting Physician (General Surgery) Nicholas Lose, MD as Consulting Physician (Hematology and Oncology) Eppie Gibson, MD as Attending Physician (Radiation Oncology)  DIAGNOSIS: No diagnosis found.  SUMMARY OF ONCOLOGIC HISTORY: Oncology History  Malignant neoplasm of upper-outer quadrant of left breast in female, estrogen receptor negative ([place])  06/30/2020 Initial Diagnosis   Screening mammogram showed a possible left breast mass. Diagnostic mammogram showed two adjacent masses at the 1 o'clock position, 0.7cm and 0.8cm, about 0.5cm apart, and spanning in total 1.8cm, no left axillary adenopathy. Biopsy showed IDC with squamous differentiation and DCIS, grade 3, HER-2 negative (1+), ER/PR negative, Ki67 80%.    07/05/2020 Cancer Staging   Staging form: Breast, AJCC 8th Edition - Clinical stage from 07/05/2020: Stage IB (cT1c, cN0, cM0, G3, ER-, PR-, HER2-) - Signed by Nicholas Lose, MD on 07/05/2020   07/11/2020 Genetic Testing   Negative genetic testing: no mutations detected in [facility] Common Hereditary Cancers Panel.  The report date is July 11, 2020.   The Common Hereditary Cancers Panel offered by [facility] includes sequencing and/or deletion duplication testing of the following 48 genes: APC, ATM, AXIN2, BARD1, BMPR1A, BRCA1, BRCA2, BRIP1, CDH1, CDK4, CDKN2A (p14ARF), CDKN2A (p16INK4a), CHEK2, CTNNA1, DICER1, EPCAM (Deletion/duplication testing only), GREM1 (promoter region deletion/duplication testing only), KIT, MEN1, MLH1, MSH2, MSH3, MSH6, MUTYH, NBN, NF1, NHTL1, PALB2, PDGFRA, PMS2, POLD1, POLE, PTEN, RAD50, RAD51C, RAD51D, RNF43, SDHB, SDHC, SDHD, SMAD4, SMARCA4. STK11, TP53, TSC1, TSC2, and VHL.  The following genes were evaluated for sequence changes only: SDHA and HOXB13  c.251G>A variant only.   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   09/05/2020 -  Chemotherapy    Patient is on Treatment Plan: BREAST ADJUVANT CMF IV Q21D        CHIEF COMPLIANT: Cycle 3 Day 1 CMF  INTERVAL HISTORY: Cindy [name] is a 75 y.o. with above-mentioned history of invasive ductal carcinoma of the left breastwhounderwent a left lumpectomyand is currently on adjuvant chemotherapy with CMF.She presents to the clinic todayfor a toxicity check and cycle 3.   ALLERGIES:  is allergic to sulfamethoxazole-trimethoprim and tetracyclines & related.  MEDICATIONS:  Current Outpatient Medications  Medication Sig Dispense Refill  . acetaminophen (TYLENOL) 500 MG tablet Take 1,000 mg by mouth every 6 (six) hours as needed.    . celecoxib (CELEBREX) 200 MG capsule One to 2 tablets by mouth daily as needed for pain. (Patient taking differently: Take 200-400 mg by mouth daily as needed for moderate pain. ) 180 capsule 3  . cetirizine (ZYRTEC) 10 MG tablet Take 10 mg by mouth daily as needed for allergies.    . cholecalciferol (VITAMIN D3) 25 MCG (1000 UNIT) tablet Take 2,000 Units by mouth daily.    . cycloSPORINE (RESTASIS) 0.05 % ophthalmic emulsion Place 1 drop into both eyes 2 (two) times daily as needed (dry eyes).    Marland Kitchen denosumab (PROLIA) 60 MG/ML SOSY injection Inject into the skin every 6 (six) months.    . diclofenac sodium (VOLTAREN) 1 % GEL Apply 4 g topically 4 (four) times daily. (Patient taking differently: Apply 2 g topically 4 (four) times daily as needed (pain). ) 100 g 11  . escitalopram (LEXAPRO) 10 MG tablet  Take 5 mg by mouth daily.    Marland Kitchen esomeprazole (NEXIUM) 40 MG capsule Take 1 capsule by mouth once daily 90 capsule 0  . hydrochlorothiazide (HYDRODIURIL) 25 MG tablet Take 1 tablet (25 mg total) by mouth daily. As need  for swelling (Patient taking differently: Take 25 mg by mouth daily as needed (swelling). ) 90 tablet 0  . levothyroxine (SYNTHROID) 75 MCG tablet Take 1 tablet (75 mcg total) by mouth daily before breakfast. Take on Days 1-6 and skip Day 7- NEED ANNUAL EXAM AND LABS (Patient taking differently: Take 75 mcg by mouth See admin instructions. Take on Days 1-6 (Mon - Sat) and skip Day 7 (Sundays) - NEED ANNUAL EXAM AND LABS) 90 tablet 2  . lidocaine-prilocaine (EMLA) cream Apply to affected area once 30 g 3  . montelukast (SINGULAIR) 10 MG tablet Take 1 tablet (10 mg total) by mouth at bedtime. (Patient taking differently: Take 10 mg by mouth at bedtime as needed (allergies). ) 90 tablet 1  . Multiple Vitamins-Minerals (THRIVE FOR LIFE WOMENS PO) Take 1 tablet by mouth every morning. Also Thrive patch and Thrive milkshake daily    . NIFEdipine (PROCARDIA-XL/NIFEDICAL-XL) 30 MG 24 hr tablet Take 1 tablet by mouth once daily (Patient taking differently: Take 30 mg by mouth daily. ) 90 tablet 2  . nitroGLYCERIN (NITROSTAT) 0.4 MG SL tablet Place 1 tablet (0.4 mg total) under the tongue every 5 (five) minutes as needed for chest pain. 20 tablet 1  . ondansetron (ZOFRAN) 8 MG tablet Take 1 tablet (8 mg total) by mouth 2 (two) times daily as needed for refractory nausea / vomiting. Start on day 3 after chemotherapy. 30 tablet 1  . Probiotic Product (PROBIOTIC PO) Take 1 capsule by mouth daily.    . prochlorperazine (COMPAZINE) 10 MG tablet Take 1 tablet (10 mg total) by mouth every 6 (six) hours as needed (Nausea or vomiting). 30 tablet 1  . rosuvastatin (CRESTOR) 20 MG tablet Take 1 tablet (20 mg total) by mouth daily. 90 tablet 3  . SUMAtriptan (IMITREX) 100 MG tablet Take 0.5-1 tablets (50-100 mg total) by mouth every 2 (two) hours as needed for migraine (Max 2 pills per 24 hours). 10 tablet 3  . vitamin B-12 (CYANOCOBALAMIN) 500 MCG tablet Take 500 mcg by mouth daily.      No current facility-administered  medications for this visit.    PHYSICAL EXAMINATION: ECOG PERFORMANCE STATUS: {CHL ONC ECOG PS:[id number]}  There were no vitals filed for this visit. There were no vitals filed for this visit.  LABORATORY DATA:  I have reviewed the data as listed CMP Latest Ref Rng & Units 09/26/2020 09/12/2020 09/05/2020  Glucose 70 - 99 mg/dL 119(H) 101(H) 88  BUN 8 - 23 mg/dL 18 15 17   Creatinine 0.44 - 1.00 mg/dL 1.01(H) 0.87 0.75  Sodium 135 - 145 mmol/L 141 137 141  Potassium 3.5 - 5.1 mmol/L 4.2 4.6 3.9  Chloride 98 - 111 mmol/L 108 105 110  CO2 22 - 32 mmol/L 25 26 28   Calcium 8.9 - 10.3 mg/dL 8.8(L) 9.0 8.8(L)  Total Protein 6.5 - 8.1 g/dL 6.5 6.4(L) 6.3(L)  Total Bilirubin 0.3 - 1.2 mg/dL 0.4 0.8 0.5  Alkaline Phos 38 - 126 U/L 89 66 63  AST 15 - 41 U/L 22 15 21   ALT 0 - 44 U/L 18 13 18     Lab Results  Component Value Date   WBC 4.0 09/26/2020   HGB 13.3 09/26/2020   HCT  39.7 09/26/2020   MCV 95.0 09/26/2020   PLT 198 09/26/2020   NEUTROABS 1.5 (L) 09/26/2020    ASSESSMENT & PLAN:  No problem-specific Assessment & Plan notes found for this encounter.    No orders of the defined types were placed in this encounter.  The patient has a good understanding of the overall plan. she agrees with it. she will call with any problems that may develop before the next visit here.  Total time spent: *** mins including face to face time and time spent for planning, charting and coordination of care  Rulon Eisenmenger, MD, MPH 10/16/2020  I, Cloyde Reams Dorshimer, am acting as scribe for Dr. Nicholas Lose.  {insert scribe attestation}

## 2020-10-17 ENCOUNTER — Inpatient Hospital Stay: Payer: Medicare Other

## 2020-10-17 ENCOUNTER — Telehealth: Payer: Medicare Other | Admitting: Hematology and Oncology

## 2020-10-17 ENCOUNTER — Other Ambulatory Visit: Payer: Medicare Other

## 2020-10-17 ENCOUNTER — Encounter: Payer: Self-pay | Admitting: *Deleted

## 2020-10-17 ENCOUNTER — Inpatient Hospital Stay (HOSPITAL_BASED_OUTPATIENT_CLINIC_OR_DEPARTMENT_OTHER): Payer: Medicare Other | Admitting: Oncology

## 2020-10-17 ENCOUNTER — Other Ambulatory Visit: Payer: Self-pay

## 2020-10-17 ENCOUNTER — Inpatient Hospital Stay: Payer: Medicare Other | Attending: Hematology and Oncology

## 2020-10-17 VITALS — BP 117/80 | HR 78 | Temp 98.3°F | Resp 18

## 2020-10-17 VITALS — Wt 147.0 lb

## 2020-10-17 DIAGNOSIS — Z5111 Encounter for antineoplastic chemotherapy: Secondary | ICD-10-CM | POA: Diagnosis not present

## 2020-10-17 DIAGNOSIS — Z95828 Presence of other vascular implants and grafts: Secondary | ICD-10-CM

## 2020-10-17 DIAGNOSIS — Z171 Estrogen receptor negative status [ER-]: Secondary | ICD-10-CM | POA: Insufficient documentation

## 2020-10-17 DIAGNOSIS — M810 Age-related osteoporosis without current pathological fracture: Secondary | ICD-10-CM

## 2020-10-17 DIAGNOSIS — C50412 Malignant neoplasm of upper-outer quadrant of left female breast: Secondary | ICD-10-CM | POA: Insufficient documentation

## 2020-10-17 DIAGNOSIS — E038 Other specified hypothyroidism: Secondary | ICD-10-CM | POA: Diagnosis not present

## 2020-10-17 LAB — CMP (CANCER CENTER ONLY)
ALT: 18 U/L (ref 0–44)
AST: 23 U/L (ref 15–41)
Albumin: 3.6 g/dL (ref 3.5–5.0)
Alkaline Phosphatase: 95 U/L (ref 38–126)
Anion gap: 7 (ref 5–15)
BUN: 14 mg/dL (ref 8–23)
CO2: 25 mmol/L (ref 22–32)
Calcium: 8.9 mg/dL (ref 8.9–10.3)
Chloride: 107 mmol/L (ref 98–111)
Creatinine: 0.88 mg/dL (ref 0.44–1.00)
GFR, Estimated: >60 mL/min (ref >60)
Glucose, Bld: 96 mg/dL (ref 70–99)
Potassium: 4.7 mmol/L (ref 3.5–5.1)
Sodium: 139 mmol/L (ref 135–145)
Total Bilirubin: 0.4 mg/dL (ref 0.3–1.2)
Total Protein: 6.6 g/dL (ref 6.5–8.1)

## 2020-10-17 LAB — CBC WITH DIFFERENTIAL (CANCER CENTER ONLY)
Abs Immature Granulocytes: 0.03 10*3/uL (ref 0.00–0.07)
Basophils Absolute: 0.1 10*3/uL (ref 0.0–0.1)
Basophils Relative: 1 %
Eosinophils Absolute: 0.2 10*3/uL (ref 0.0–0.5)
Eosinophils Relative: 3 %
HCT: 40 % (ref 36.0–46.0)
Hemoglobin: 13.1 g/dL (ref 12.0–15.0)
Immature Granulocytes: 1 %
Lymphocytes Relative: 30 %
Lymphs Abs: 1.4 10*3/uL (ref 0.7–4.0)
MCH: 31 pg (ref 26.0–34.0)
MCHC: 32.8 g/dL (ref 30.0–36.0)
MCV: 94.6 fL (ref 80.0–100.0)
Monocytes Absolute: 0.9 10*3/uL (ref 0.1–1.0)
Monocytes Relative: 19 %
Neutro Abs: 2.1 10*3/uL (ref 1.7–7.7)
Neutrophils Relative %: 46 %
Platelet Count: 219 10*3/uL (ref 150–400)
RBC: 4.23 MIL/uL (ref 3.87–5.11)
RDW: 13.2 % (ref 11.5–15.5)
WBC Count: 4.7 10*3/uL (ref 4.0–10.5)
nRBC: 0 % (ref 0.0–0.2)

## 2020-10-17 MED ORDER — SODIUM CHLORIDE 0.9 % IV SOLN
Freq: Once | INTRAVENOUS | Status: AC
Start: 2020-10-17 — End: 2020-10-17
  Filled 2020-10-17: qty 250

## 2020-10-17 MED ORDER — SODIUM CHLORIDE 0.9 % IV SOLN
10.0000 mg | Freq: Once | INTRAVENOUS | Status: AC
  Administered 2020-10-17: 10 mg via INTRAVENOUS
  Filled 2020-10-17: qty 10

## 2020-10-17 MED ORDER — PALONOSETRON HCL INJECTION 0.25 MG/5ML
0.2500 mg | Freq: Once | INTRAVENOUS | Status: AC
  Administered 2020-10-17: 0.25 mg via INTRAVENOUS

## 2020-10-17 MED ORDER — METHOTREXATE SODIUM (PF) CHEMO INJECTION 250 MG/10ML
40.0000 mg/m2 | Freq: Once | INTRAMUSCULAR | Status: AC
  Administered 2020-10-17: 70 mg via INTRAVENOUS
  Filled 2020-10-17: qty 2.8

## 2020-10-17 MED ORDER — SODIUM CHLORIDE 0.9 % IV SOLN
600.0000 mg/m2 | Freq: Once | INTRAVENOUS | Status: AC
  Administered 2020-10-17: 1060 mg via INTRAVENOUS
  Filled 2020-10-17: qty 53

## 2020-10-17 MED ORDER — PALONOSETRON HCL INJECTION 0.25 MG/5ML
INTRAVENOUS | Status: AC
  Filled 2020-10-17: qty 5

## 2020-10-17 MED ORDER — HEPARIN SOD (PORK) LOCK FLUSH 100 UNIT/ML IV SOLN
500.0000 [IU] | Freq: Once | INTRAVENOUS | Status: AC | PRN
  Administered 2020-10-17: 500 [IU]
  Filled 2020-10-17: qty 5

## 2020-10-17 MED ORDER — FLUOROURACIL CHEMO INJECTION 2.5 GM/50ML
600.0000 mg/m2 | Freq: Once | INTRAVENOUS | Status: AC
  Administered 2020-10-17: 1050 mg via INTRAVENOUS
  Filled 2020-10-17: qty 21

## 2020-10-17 MED ORDER — SODIUM CHLORIDE 0.9% FLUSH
10.0000 mL | Freq: Once | INTRAVENOUS | Status: AC
  Administered 2020-10-17: 10 mL
  Filled 2020-10-17: qty 10

## 2020-10-17 MED ORDER — SODIUM CHLORIDE 0.9% FLUSH
10.0000 mL | INTRAVENOUS | Status: DC | PRN
  Administered 2020-10-17: 10 mL
  Filled 2020-10-17: qty 10

## 2020-10-17 NOTE — Progress Notes (Signed)
Patient Care Team: Emeterio Reeve, DO as PCP - General (Osteopathic Medicine) Mauro Kaufmann, RN as Oncology Nurse Navigator Rockwell Germany, RN as Oncology Nurse Navigator Stark Klein, MD as Consulting Physician (General Surgery) Nicholas Lose, MD as Consulting Physician (Hematology and Oncology) Eppie Gibson, MD as Attending Physician (Radiation Oncology)  DIAGNOSIS:  75 year old woman with breast cancer diagnosed in October 2021.  Final staging was T1c of the left breast, ER/PR negative, HER-2 negative with Ki-67 at 80%.   SUMMARY OF ONCOLOGIC HISTORY: Oncology History  Malignant neoplasm of upper-outer quadrant of left breast in female, estrogen receptor negative ([place])  06/30/2020 Initial Diagnosis   Screening mammogram showed a possible left breast mass. Diagnostic mammogram showed two adjacent masses at the 1 o'clock position, 0.7cm and 0.8cm, about 0.5cm apart, and spanning in total 1.8cm, no left axillary adenopathy. Biopsy showed IDC with squamous differentiation and DCIS, grade 3, HER-2 negative (1+), ER/PR negative, Ki67 80%.    07/05/2020 Cancer Staging   Staging form: Breast, AJCC 8th Edition - Clinical stage from 07/05/2020: Stage IB (cT1c, cN0, cM0, G3, ER-, PR-, HER2-) - Signed by Nicholas Lose, MD on 07/05/2020   07/11/2020 Genetic Testing   Negative genetic testing: no mutations detected in [facility] Common Hereditary Cancers Panel.  The report date is July 11, 2020.   The Common Hereditary Cancers Panel offered by [facility] includes sequencing and/or deletion duplication testing of the following 48 genes: APC, ATM, AXIN2, BARD1, BMPR1A, BRCA1, BRCA2, BRIP1, CDH1, CDK4, CDKN2A (p14ARF), CDKN2A (p16INK4a), CHEK2, CTNNA1, DICER1, EPCAM (Deletion/duplication testing only), GREM1 (promoter region deletion/duplication testing only), KIT, MEN1, MLH1, MSH2, MSH3, MSH6, MUTYH, NBN, NF1, NHTL1, PALB2, PDGFRA, PMS2, POLD1, POLE, PTEN, RAD50, RAD51C, RAD51D, RNF43, SDHB,  SDHC, SDHD, SMAD4, SMARCA4. STK11, TP53, TSC1, TSC2, and VHL.  The following genes were evaluated for sequence changes only: SDHA and HOXB13 c.251G>A variant only.   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   09/05/2020 -  Chemotherapy    Patient is on Treatment Plan: BREAST ADJUVANT CMF IV Q21D           Current therapy: She is currently receiving CMF adjuvant chemotherapy starting on September 05, 2020.  She is here for cycle 3.  INTERVAL HISTORY: Evolette Pendell returns today for a follow-up evaluation.  Since her last visit, she reports no major changes in her health.  She has tolerated chemotherapy without any major complaints.  She does report some mild fatigue tiredness.  She denies any diarrhea or nausea.  She denies any worsening neuropathy.  She did report some occasional hair loss and periodic dyspnea but overall performance status quality of life remain excellent.   MEDICATIONS: Reviewed today. Current Outpatient Medications  Medication Sig Dispense Refill  . acetaminophen (TYLENOL) 500 MG tablet Take 1,000 mg by mouth every 6 (six) hours as needed.    . celecoxib (CELEBREX) 200 MG capsule One to 2 tablets by mouth daily as needed for pain. (Patient taking differently: Take 200-400 mg by mouth daily as needed for moderate pain. ) 180 capsule 3  . cetirizine (ZYRTEC) 10 MG tablet Take 10 mg by mouth daily as needed for allergies.    . cholecalciferol (VITAMIN D3) 25 MCG (1000 UNIT) tablet Take 2,000 Units by mouth daily.    . cycloSPORINE (RESTASIS) 0.05 % ophthalmic emulsion Place 1 drop into both eyes 2 (two) times daily  as needed (dry eyes).    Marland Kitchen denosumab (PROLIA) 60 MG/ML SOSY injection Inject into the skin every 6 (six) months.    . diclofenac sodium (VOLTAREN) 1 % GEL Apply 4 g topically 4 (four) times daily. (Patient taking  differently: Apply 2 g topically 4 (four) times daily as needed (pain). ) 100 g 11  . escitalopram (LEXAPRO) 10 MG tablet Take 5 mg by mouth daily.    Marland Kitchen esomeprazole (NEXIUM) 40 MG capsule Take 1 capsule by mouth once daily 90 capsule 0  . hydrochlorothiazide (HYDRODIURIL) 25 MG tablet Take 1 tablet (25 mg total) by mouth daily. As need for swelling (Patient taking differently: Take 25 mg by mouth daily as needed (swelling). ) 90 tablet 0  . levothyroxine (SYNTHROID) 75 MCG tablet Take 1 tablet (75 mcg total) by mouth daily before breakfast. Take on Days 1-6 and skip Day 7- NEED ANNUAL EXAM AND LABS (Patient taking differently: Take 75 mcg by mouth See admin instructions. Take on Days 1-6 (Mon - Sat) and skip Day 7 (Sundays) - NEED ANNUAL EXAM AND LABS) 90 tablet 2  . lidocaine-prilocaine (EMLA) cream Apply to affected area once 30 g 3  . montelukast (SINGULAIR) 10 MG tablet Take 1 tablet (10 mg total) by mouth at bedtime. (Patient taking differently: Take 10 mg by mouth at bedtime as needed (allergies). ) 90 tablet 1  . Multiple Vitamins-Minerals (THRIVE FOR LIFE WOMENS PO) Take 1 tablet by mouth every morning. Also Thrive patch and Thrive milkshake daily    . NIFEdipine (PROCARDIA-XL/NIFEDICAL-XL) 30 MG 24 hr tablet Take 1 tablet by mouth once daily (Patient taking differently: Take 30 mg by mouth daily. ) 90 tablet 2  . nitroGLYCERIN (NITROSTAT) 0.4 MG SL tablet Place 1 tablet (0.4 mg total) under the tongue every 5 (five) minutes as needed for chest pain. 20 tablet 1  . ondansetron (ZOFRAN) 8 MG tablet Take 1 tablet (8 mg total) by mouth 2 (two) times daily as needed for refractory nausea / vomiting. Start on day 3 after chemotherapy. 30 tablet 1  . Probiotic Product (PROBIOTIC PO) Take 1 capsule by mouth daily.    . prochlorperazine (COMPAZINE) 10 MG tablet Take 1 tablet (10 mg total) by mouth every 6 (six) hours as needed (Nausea or vomiting). 30 tablet 1  . rosuvastatin (CRESTOR) 20 MG tablet  Take 1 tablet (20 mg total) by mouth daily. 90 tablet 3  . SUMAtriptan (IMITREX) 100 MG tablet Take 0.5-1 tablets (50-100 mg total) by mouth every 2 (two) hours as needed for migraine (Max 2 pills per 24 hours). 10 tablet 3  . vitamin B-12 (CYANOCOBALAMIN) 500 MCG tablet Take 500 mcg by mouth daily.      No current facility-administered medications for this visit.   Facility-Administered Medications Ordered in Other Visits  Medication Dose Route Frequency Provider Last Rate Last Admin  . sodium chloride flush (NS) 0.9 % injection 10 mL  10 mL Intracatheter Once Nicholas Lose, MD        PHYSICAL EXAMINATION: Blood pressure 117/80, pulse 78, temperature 98.3 F (36.8 C), temperature source Oral, resp. rate 18, SpO2 97 %.   ECOG PERFORMANCE STATUS: 1 - Symptomatic but completely ambulatory   General appearance: Comfortable appearing without any discomfort Head: Normocephalic without any trauma Oropharynx: Mucous membranes are moist and pink without any thrush or ulcers. Eyes: Pupils are equal and round reactive to light. Lymph nodes: No cervical, supraclavicular, inguinal or axillary lymphadenopathy.   Heart:regular rate and rhythm.  S1 and S2 without leg edema. Lung: Clear without any rhonchi or wheezes.  No dullness to percussion. Abdomin: Soft, nontender, nondistended with good bowel sounds.  No hepatosplenomegaly. Musculoskeletal: No joint deformity or effusion.  Full range of motion noted. Neurological: No deficits noted on motor, sensory and deep tendon reflex exam. Skin: No petechial rash or dryness.  Appeared moist.      LABORATORY DATA:  I have reviewed the data as listed CMP Latest Ref Rng & Units 09/26/2020 09/12/2020 09/05/2020  Glucose 70 - 99 mg/dL 119(H) 101(H) 88  BUN 8 - 23 mg/dL 18 15 17   Creatinine 0.44 - 1.00 mg/dL 1.01(H) 0.87 0.75  Sodium 135 - 145 mmol/L 141 137 141  Potassium 3.5 - 5.1 mmol/L 4.2 4.6 3.9  Chloride 98 - 111 mmol/L 108 105 110  CO2 22 - 32  mmol/L 25 26 28   Calcium 8.9 - 10.3 mg/dL 8.8(L) 9.0 8.8(L)  Total Protein 6.5 - 8.1 g/dL 6.5 6.4(L) 6.3(L)  Total Bilirubin 0.3 - 1.2 mg/dL 0.4 0.8 0.5  Alkaline Phos 38 - 126 U/L 89 66 63  AST 15 - 41 U/L 22 15 21   ALT 0 - 44 U/L 18 13 18     Lab Results  Component Value Date   WBC 4.0 09/26/2020   HGB 13.3 09/26/2020   HCT 39.7 09/26/2020   MCV 95.0 09/26/2020   PLT 198 09/26/2020   NEUTROABS 1.5 (L) 09/26/2020    ASSESSMENT & PLAN:   75 year old woman with:  1.  Breast cancer diagnosed in October 2021.  She was found to have 1.2 cm tumor, ER/PR negative, Ki-67 80%, HER-2 negative.   She is currently receiving adjuvant chemotherapy utilizing CMF regimen and here for cycle 3.  Risks and benefits of proceeding with that today's treatment were discussed at this time.  Complications that include nausea, vomiting, myelosuppression, neutropenia possible sepsis were reiterated.  Laboratory data were reviewed from today and appears adequate to proceed.  She is agreeable at this time.  The plan is to complete 6 cycles of therapy followed by adjuvant radiation.  2.  IV access: Port-A-Cath is currently in use without any issues.  3.  Antiemetics: Prescription for Compazine is available to her without any nausea or vomiting.  4.  Hypothyroidism: TSH checked in June 2021 was within normal range.  We will update her thyroid function with TSH before the next visit.   5.  Follow-up: In 3 weeks for the next cycle of therapy.   30  minutes were dedicated to this visit. The time was spent on reviewing laboratory data, addressing complications related to her cancer and cancer therapy and future plan of care review.  Zola Button MD   10/17/2020.

## 2020-10-17 NOTE — Patient Instructions (Signed)

## 2020-10-17 NOTE — Patient Instructions (Signed)
[facility] Discharge Instructions for Patients Receiving Chemotherapy  Today you received the following chemotherapy agents Cyclophosphamide, Methotrexate, Fluorouricil   To help prevent nausea and vomiting after your treatment, we encourage you to take your nausea medication as directed  If you develop nausea and vomiting that is not controlled by your nausea medication, call the clinic.   BELOW ARE SYMPTOMS THAT SHOULD BE REPORTED IMMEDIATELY:  *FEVER GREATER THAN 100.5 F  *CHILLS WITH OR WITHOUT FEVER  NAUSEA AND VOMITING THAT IS NOT CONTROLLED WITH YOUR NAUSEA MEDICATION  *UNUSUAL SHORTNESS OF BREATH  *UNUSUAL BRUISING OR BLEEDING  TENDERNESS IN MOUTH AND THROAT WITH OR WITHOUT PRESENCE OF ULCERS  *URINARY PROBLEMS  *BOWEL PROBLEMS  UNUSUAL RASH Items with * indicate a potential emergency and should be followed up as soon as possible.  Feel free to call the clinic should you have any questions or concerns. The clinic phone number is [phone].  Please show the [facility] at check-in to the Emergency Department and triage nurse.

## 2020-11-06 NOTE — Progress Notes (Signed)
Patient Care Team: Emeterio Reeve, DO as PCP - General (Osteopathic Medicine) Mauro Kaufmann, RN as Oncology Nurse Navigator Rockwell Germany, RN as Oncology Nurse Navigator Stark Klein, MD as Consulting Physician (General Surgery) Nicholas Lose, MD as Consulting Physician (Hematology and Oncology) Eppie Gibson, MD as Attending Physician (Radiation Oncology)  DIAGNOSIS:    ICD-10-CM   1. Malignant neoplasm of upper-outer quadrant of left breast in female, estrogen receptor negative ([place])  C50.412    Z17.1     SUMMARY OF ONCOLOGIC HISTORY: Oncology History  Malignant neoplasm of upper-outer quadrant of left breast in female, estrogen receptor negative ([place])  06/30/2020 Initial Diagnosis   Screening mammogram showed a possible left breast mass. Diagnostic mammogram showed two adjacent masses at the 1 o'clock position, 0.7cm and 0.8cm, about 0.5cm apart, and spanning in total 1.8cm, no left axillary adenopathy. Biopsy showed IDC with squamous differentiation and DCIS, grade 3, HER-2 negative (1+), ER/PR negative, Ki67 80%.    07/05/2020 Cancer Staging   Staging form: Breast, AJCC 8th Edition - Clinical stage from 07/05/2020: Stage IB (cT1c, cN0, cM0, G3, ER-, PR-, HER2-) - Signed by Nicholas Lose, MD on 07/05/2020   07/11/2020 Genetic Testing   Negative genetic testing: no mutations detected in [employer] Common Hereditary Cancers Panel.  The report date is July 11, 2020.   The Common Hereditary Cancers Panel offered by [facility] includes sequencing and/or deletion duplication testing of the following 48 genes: APC, ATM, AXIN2, BARD1, BMPR1A, BRCA1, BRCA2, BRIP1, CDH1, CDK4, CDKN2A (p14ARF), CDKN2A (p16INK4a), CHEK2, CTNNA1, DICER1, EPCAM (Deletion/duplication testing only), GREM1 (promoter region deletion/duplication testing only), KIT, MEN1, MLH1, MSH2, MSH3, MSH6, MUTYH, NBN, NF1, NHTL1, PALB2, PDGFRA, PMS2, POLD1, POLE, PTEN, RAD50, RAD51C, RAD51D, RNF43, SDHB, SDHC, SDHD, SMAD4,  SMARCA4. STK11, TP53, TSC1, TSC2, and VHL.  The following genes were evaluated for sequence changes only: SDHA and HOXB13 c.251G>A variant only.   08/15/2020 Surgery   Left lumpectomy 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   09/05/2020 -  Chemotherapy    Patient is on Treatment Plan: BREAST ADJUVANT CMF IV Q21D        CHIEF COMPLIANT: Cycle 4 Day 1 CMF  INTERVAL HISTORY: Cindy [name] is a 75 y.o. with above-mentioned history of invasive ductal carcinoma of the left breastwhounderwent a left lumpectomyand is currently on adjuvant chemotherapy with CMF.She presents to the clinic todayfor a toxicity check and cycle 4.  The first week after chemotherapy she felt extremely tired.  She has mild nausea for 1 to 2 days after chemo.  Hair is thinning.  ALLERGIES:  is allergic to sulfamethoxazole-trimethoprim and tetracyclines & related.  MEDICATIONS:  Current Outpatient Medications  Medication Sig Dispense Refill  . acetaminophen (TYLENOL) 500 MG tablet Take 1,000 mg by mouth every 6 (six) hours as needed.    . celecoxib (CELEBREX) 200 MG capsule One to 2 tablets by mouth daily as needed for pain. (Patient taking differently: Take 200-400 mg by mouth daily as needed for moderate pain. ) 180 capsule 3  . cetirizine (ZYRTEC) 10 MG tablet Take 10 mg by mouth daily as needed for allergies.    . cholecalciferol (VITAMIN D3) 25 MCG (1000 UNIT) tablet Take 2,000 Units by mouth daily.    . cycloSPORINE (RESTASIS) 0.05 % ophthalmic emulsion Place 1 drop into both eyes 2 (two) times daily as needed (dry eyes).    Marland Kitchen denosumab (PROLIA) 60 MG/ML SOSY  injection Inject into the skin every 6 (six) months.    . diclofenac sodium (VOLTAREN) 1 % GEL Apply 4 g topically 4 (four) times daily. (Patient taking differently: Apply 2 g topically 4 (four) times daily as needed (pain). ) 100 g 11  .  escitalopram (LEXAPRO) 10 MG tablet Take 5 mg by mouth daily.    Marland Kitchen esomeprazole (NEXIUM) 40 MG capsule Take 1 capsule by mouth once daily 90 capsule 0  . hydrochlorothiazide (HYDRODIURIL) 25 MG tablet Take 1 tablet (25 mg total) by mouth daily. As need for swelling (Patient taking differently: Take 25 mg by mouth daily as needed (swelling). ) 90 tablet 0  . levothyroxine (SYNTHROID) 75 MCG tablet Take 1 tablet (75 mcg total) by mouth daily before breakfast. Take on Days 1-6 and skip Day 7- NEED ANNUAL EXAM AND LABS (Patient taking differently: Take 75 mcg by mouth See admin instructions. Take on Days 1-6 (Mon - Sat) and skip Day 7 (Sundays) - NEED ANNUAL EXAM AND LABS) 90 tablet 2  . lidocaine-prilocaine (EMLA) cream Apply to affected area once 30 g 3  . montelukast (SINGULAIR) 10 MG tablet Take 1 tablet (10 mg total) by mouth at bedtime. (Patient taking differently: Take 10 mg by mouth at bedtime as needed (allergies). ) 90 tablet 1  . Multiple Vitamins-Minerals (THRIVE FOR LIFE WOMENS PO) Take 1 tablet by mouth every morning. Also Thrive patch and Thrive milkshake daily    . NIFEdipine (PROCARDIA-XL/NIFEDICAL-XL) 30 MG 24 hr tablet Take 1 tablet by mouth once daily (Patient taking differently: Take 30 mg by mouth daily. ) 90 tablet 2  . nitroGLYCERIN (NITROSTAT) 0.4 MG SL tablet Place 1 tablet (0.4 mg total) under the tongue every 5 (five) minutes as needed for chest pain. 20 tablet 1  . ondansetron (ZOFRAN) 8 MG tablet Take 1 tablet (8 mg total) by mouth 2 (two) times daily as needed for refractory nausea / vomiting. Start on day 3 after chemotherapy. 30 tablet 1  . Probiotic Product (PROBIOTIC PO) Take 1 capsule by mouth daily.    . prochlorperazine (COMPAZINE) 10 MG tablet Take 1 tablet (10 mg total) by mouth every 6 (six) hours as needed (Nausea or vomiting). 30 tablet 1  . rosuvastatin (CRESTOR) 20 MG tablet Take 1 tablet (20 mg total) by mouth daily. 90 tablet 3  . SUMAtriptan (IMITREX) 100 MG  tablet Take 0.5-1 tablets (50-100 mg total) by mouth every 2 (two) hours as needed for migraine (Max 2 pills per 24 hours). 10 tablet 3  . vitamin B-12 (CYANOCOBALAMIN) 500 MCG tablet Take 500 mcg by mouth daily.      No current facility-administered medications for this visit.    PHYSICAL EXAMINATION: ECOG PERFORMANCE STATUS: 1 - Symptomatic but completely ambulatory  Vitals:   11/07/20 1212  BP: 137/75  Pulse: 80  Resp: 17  Temp: (!) 97.3 F (36.3 C)  SpO2: 97%   Filed Weights   11/07/20 1212  Weight: 152 lb 12.8 oz (69.3 kg)     LABORATORY DATA:  I have reviewed the data as listed CMP Latest Ref Rng & Units 10/17/2020 09/26/2020 09/12/2020  Glucose 70 - 99 mg/dL 96 119(H) 101(H)  BUN 8 - 23 mg/dL 14 18 15   Creatinine 0.44 - 1.00 mg/dL 0.88 1.01(H) 0.87  Sodium 135 - 145 mmol/L 139 141 137  Potassium 3.5 - 5.1 mmol/L 4.7 4.2 4.6  Chloride 98 - 111 mmol/L 107 108 105  CO2 22 - 32 mmol/L 25  25 26  Calcium 8.9 - 10.3 mg/dL 8.9 8.8(L) 9.0  Total Protein 6.5 - 8.1 g/dL 6.6 6.5 6.4(L)  Total Bilirubin 0.3 - 1.2 mg/dL 0.4 0.4 0.8  Alkaline Phos 38 - 126 U/L 95 89 66  AST 15 - 41 U/L 23 22 15   ALT 0 - 44 U/L 18 18 13     Lab Results  Component Value Date   WBC 3.6 (L) 11/07/2020   HGB 12.6 11/07/2020   HCT 38.3 11/07/2020   MCV 95.0 11/07/2020   PLT 193 11/07/2020   NEUTROABS 1.6 (L) 11/07/2020    ASSESSMENT & PLAN:  Malignant neoplasm of upper-outer quadrant of left breast in female, estrogen receptor negative ([place]) 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 Most likely it is from fatigue related to chemotherapy. 3.  Thinning of hair 4.  ANC today 1.6: Okay to treat.  TSH has been checked.  We will await the results.  Return to clinic in3 weeks for cycle 4.    No orders of the defined types were placed in this encounter.  The patient has a good understanding of the overall plan. she agrees with it. she will call with any problems that may develop before the next visit here.  Total time spent: 30 mins including face to face time and time spent for planning, charting and coordination of care  Rulon Eisenmenger, MD, MPH 11/07/2020  I, Cloyde Reams Dorshimer, am acting as scribe for Dr. Nicholas Lose.  I have reviewed the above documentation for accuracy and completeness, and I agree with the above.

## 2020-11-06 NOTE — Assessment & Plan Note (Signed)
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  Resolved.  Most likely it is from fatigue related to chemotherapy.  Return to clinic in3 weeks for cycle 4.

## 2020-11-07 ENCOUNTER — Inpatient Hospital Stay: Payer: Medicare Other | Attending: Hematology and Oncology

## 2020-11-07 ENCOUNTER — Inpatient Hospital Stay (HOSPITAL_BASED_OUTPATIENT_CLINIC_OR_DEPARTMENT_OTHER): Payer: Medicare Other | Admitting: Hematology and Oncology

## 2020-11-07 ENCOUNTER — Inpatient Hospital Stay: Payer: Medicare Other

## 2020-11-07 ENCOUNTER — Other Ambulatory Visit: Payer: Self-pay

## 2020-11-07 DIAGNOSIS — C50412 Malignant neoplasm of upper-outer quadrant of left female breast: Secondary | ICD-10-CM | POA: Insufficient documentation

## 2020-11-07 DIAGNOSIS — Z171 Estrogen receptor negative status [ER-]: Secondary | ICD-10-CM

## 2020-11-07 DIAGNOSIS — Z5111 Encounter for antineoplastic chemotherapy: Secondary | ICD-10-CM | POA: Insufficient documentation

## 2020-11-07 DIAGNOSIS — Z79899 Other long term (current) drug therapy: Secondary | ICD-10-CM | POA: Diagnosis not present

## 2020-11-07 DIAGNOSIS — E038 Other specified hypothyroidism: Secondary | ICD-10-CM

## 2020-11-07 DIAGNOSIS — Z5189 Encounter for other specified aftercare: Secondary | ICD-10-CM | POA: Insufficient documentation

## 2020-11-07 DIAGNOSIS — M810 Age-related osteoporosis without current pathological fracture: Secondary | ICD-10-CM

## 2020-11-07 DIAGNOSIS — Z95828 Presence of other vascular implants and grafts: Secondary | ICD-10-CM

## 2020-11-07 LAB — CMP (CANCER CENTER ONLY)
ALT: 17 U/L (ref 0–44)
AST: 23 U/L (ref 15–41)
Albumin: 3.7 g/dL (ref 3.5–5.0)
Alkaline Phosphatase: 70 U/L (ref 38–126)
Anion gap: 9 (ref 5–15)
BUN: 14 mg/dL (ref 8–23)
CO2: 24 mmol/L (ref 22–32)
Calcium: 8.3 mg/dL — ABNORMAL LOW (ref 8.9–10.3)
Chloride: 109 mmol/L (ref 98–111)
Creatinine: 0.93 mg/dL (ref 0.44–1.00)
GFR, Estimated: >60 mL/min (ref >60)
Glucose, Bld: 94 mg/dL (ref 70–99)
Potassium: 4.7 mmol/L (ref 3.5–5.1)
Sodium: 142 mmol/L (ref 135–145)
Total Bilirubin: 0.5 mg/dL (ref 0.3–1.2)
Total Protein: 6.2 g/dL — ABNORMAL LOW (ref 6.5–8.1)

## 2020-11-07 LAB — CBC WITH DIFFERENTIAL (CANCER CENTER ONLY)
Abs Immature Granulocytes: 0.03 10*3/uL (ref 0.00–0.07)
Basophils Absolute: 0.1 10*3/uL (ref 0.0–0.1)
Basophils Relative: 2 %
Eosinophils Absolute: 0.1 10*3/uL (ref 0.0–0.5)
Eosinophils Relative: 3 %
HCT: 38.3 % (ref 36.0–46.0)
Hemoglobin: 12.6 g/dL (ref 12.0–15.0)
Immature Granulocytes: 1 %
Lymphocytes Relative: 29 %
Lymphs Abs: 1 10*3/uL (ref 0.7–4.0)
MCH: 31.3 pg (ref 26.0–34.0)
MCHC: 32.9 g/dL (ref 30.0–36.0)
MCV: 95 fL (ref 80.0–100.0)
Monocytes Absolute: 0.8 10*3/uL (ref 0.1–1.0)
Monocytes Relative: 22 %
Neutro Abs: 1.6 10*3/uL — ABNORMAL LOW (ref 1.7–7.7)
Neutrophils Relative %: 43 %
Platelet Count: 193 10*3/uL (ref 150–400)
RBC: 4.03 MIL/uL (ref 3.87–5.11)
RDW: 13.7 % (ref 11.5–15.5)
WBC Count: 3.6 10*3/uL — ABNORMAL LOW (ref 4.0–10.5)
nRBC: 0 % (ref 0.0–0.2)

## 2020-11-07 LAB — TSH: TSH: 2.356 u[IU]/mL (ref 0.308–3.960)

## 2020-11-07 MED ORDER — PALONOSETRON HCL INJECTION 0.25 MG/5ML
INTRAVENOUS | Status: AC
  Filled 2020-11-07: qty 5

## 2020-11-07 MED ORDER — SODIUM CHLORIDE 0.9% FLUSH
10.0000 mL | INTRAVENOUS | Status: DC | PRN
  Administered 2020-11-07: 10 mL
  Filled 2020-11-07: qty 10

## 2020-11-07 MED ORDER — FLUOROURACIL CHEMO INJECTION 2.5 GM/50ML
600.0000 mg/m2 | Freq: Once | INTRAVENOUS | Status: AC
  Administered 2020-11-07: 1050 mg via INTRAVENOUS
  Filled 2020-11-07: qty 21

## 2020-11-07 MED ORDER — HEPARIN SOD (PORK) LOCK FLUSH 100 UNIT/ML IV SOLN
500.0000 [IU] | Freq: Once | INTRAVENOUS | Status: AC | PRN
  Administered 2020-11-07: 500 [IU]
  Filled 2020-11-07: qty 5

## 2020-11-07 MED ORDER — PALONOSETRON HCL INJECTION 0.25 MG/5ML
0.2500 mg | Freq: Once | INTRAVENOUS | Status: AC
  Administered 2020-11-07: 0.25 mg via INTRAVENOUS

## 2020-11-07 MED ORDER — SODIUM CHLORIDE 0.9 % IV SOLN
10.0000 mg | Freq: Once | INTRAVENOUS | Status: AC
  Administered 2020-11-07: 10 mg via INTRAVENOUS
  Filled 2020-11-07: qty 10

## 2020-11-07 MED ORDER — SODIUM CHLORIDE 0.9 % IV SOLN
Freq: Once | INTRAVENOUS | Status: AC
  Filled 2020-11-07: qty 250

## 2020-11-07 MED ORDER — METHOTREXATE SODIUM (PF) CHEMO INJECTION 250 MG/10ML
40.0000 mg/m2 | Freq: Once | INTRAMUSCULAR | Status: AC
  Administered 2020-11-07: 70 mg via INTRAVENOUS
  Filled 2020-11-07: qty 2.8

## 2020-11-07 MED ORDER — SODIUM CHLORIDE 0.9 % IV SOLN
600.0000 mg/m2 | Freq: Once | INTRAVENOUS | Status: AC
  Administered 2020-11-07: 1060 mg via INTRAVENOUS
  Filled 2020-11-07: qty 53

## 2020-11-07 MED ORDER — SODIUM CHLORIDE 0.9% FLUSH
10.0000 mL | Freq: Once | INTRAVENOUS | Status: AC
  Administered 2020-11-07: 10 mL
  Filled 2020-11-07: qty 10

## 2020-11-07 NOTE — Patient Instructions (Signed)
Cindy [name] Discharge Instructions for Patients Receiving Chemotherapy  Today you received the following chemotherapy agents: Cyclophosphamide (Cytoxan), Methotrexate, and Fluorouracil.  To help prevent nausea and vomiting after your treatment, we encourage you to take your nausea medication as directed by your MD.   If you develop nausea and vomiting that is not controlled by your nausea medication, call the clinic.   BELOW ARE SYMPTOMS THAT SHOULD BE REPORTED IMMEDIATELY:  *FEVER GREATER THAN 100.5 F  *CHILLS WITH OR WITHOUT FEVER  NAUSEA AND VOMITING THAT IS NOT CONTROLLED WITH YOUR NAUSEA MEDICATION  *UNUSUAL SHORTNESS OF BREATH  *UNUSUAL BRUISING OR BLEEDING  TENDERNESS IN MOUTH AND THROAT WITH OR WITHOUT PRESENCE OF ULCERS  *URINARY PROBLEMS  *BOWEL PROBLEMS  UNUSUAL RASH Items with * indicate a potential emergency and should be followed up as soon as possible.  Feel free to call the clinic should you have any questions or concerns. The clinic phone number is [phone].  Please show the Sec[facility] at check-in to the Emergency Department and triage nurse.

## 2020-11-20 ENCOUNTER — Other Ambulatory Visit: Payer: Self-pay

## 2020-11-20 ENCOUNTER — Ambulatory Visit: Payer: Medicare Other | Attending: General Surgery

## 2020-11-20 DIAGNOSIS — Z483 Aftercare following surgery for neoplasm: Secondary | ICD-10-CM | POA: Insufficient documentation

## 2020-11-20 NOTE — Therapy (Signed)
[place], [place], [address] Phone: [phone]   Fax:  [phone]  Physical Therapy Treatment  Patient Details  Name: Cindy [name] MRN: [id number] Date of Birth: Jul 28, 1946 Referring Provider (PT): Dr. Stark Klein   Encounter Date: 11/20/2020   PT End of Session - 11/20/20 1204    Visit Number 2   # unchanged due to screen only   Number of Visits 2    Date for PT Re-Evaluation 08/30/20    PT Start Time 3295    PT Stop Time 1159    PT Time Calculation (min) 11 min    Activity Tolerance Patient tolerated treatment well    Behavior During Therapy [facility] for tasks assessed/performed           Past Medical History:  Diagnosis Date  . Abnormal mammogram   . Allergic rhinitis 10/30/2018  . Anxiety in acute stress reaction 10/30/2018  . Arthritis   . Atypical chest pain 10/30/2018  . Cancer [facility])    breast   . Chest pain    "not cardiac related"  . Colon polyps   . Family history of breast cancer 07/05/2020  . Family history of pancreatic cancer 07/05/2020  . Family history of prostate cancer 07/05/2020  . GERD (gastroesophageal reflux disease) 10/30/2018  . History of kidney stones    passed  . Hypertension 10/30/2018  . Hyperthyroidism   . Hypothyroidism 10/30/2018  . Migraine 10/30/2018  . Osteoporosis 10/30/2018  . Pneumonia   . 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  . Vitamin D deficiency 10/30/2018    Past Surgical History:  Procedure Laterality Date  . BREAST LUMPECTOMY WITH RADIOACTIVE SEED AND SENTINEL LYMPH NODE BIOPSY Left 08/15/2020   Procedure: LEFT BREAST LUMPECTOMY WITH RADIOACTIVE SEED AND SENTINEL LYMPH NODE BIOPSY;  Surgeon: Stark Klein, MD;  Location: [place];  Service: General;  Laterality: Left;  RNFA  . COLONOSCOPY W/ POLYPECTOMY    . ESOPHAGEAL DILATION    . EYE SURGERY Bilateral    catheter with lens  . HYSTERECTOMY ABDOMINAL WITH SALPINGECTOMY    .  PORTACATH PLACEMENT Right 08/15/2020   Procedure: INSERTION PORT-A-CATH;  Surgeon: Stark Klein, MD;  Location: [place];  Service: General;  Laterality: Right;  . TONSILLECTOMY    . TUBAL LIGATION      There were no vitals filed for this visit.   Subjective Assessment - 11/20/20 1152    Subjective Pt returns for her 3 month L-Dex screen.    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 She had left lumpectomy on 08/15/2020 wth 2 negative nodes removed. Ki67 is 80%.                  L-DEX FLOWSHEETS - 11/20/20 1100      L-DEX LYMPHEDEMA SCREENING   Measurement Type Unilateral    L-DEX MEASUREMENT EXTREMITY Upper Extremity    POSITION  Standing    DOMINANT SIDE Right    At Risk Side Left    BASELINE SCORE (UNILATERAL) 3.9    L-DEX SCORE (UNILATERAL) 4.1    VALUE CHANGE (UNILAT) 0.2                                  PT Long Term Goals - 09/04/20 1152      PT LONG TERM GOAL #1   Title Patient will  demonstrate she has regained full shoulder ROM and function post operatively compared to baseline.    Time 8    Period Weeks    Status Partially Met                 Plan - 11/20/20 1204    Clinical Impression Statement Pt returns for her 3 month L-Dex screen. Her change from baseline of 0.2 is WNLs so no further treatment is required at this time except to cont every 3 month L-Dex screens which pt is agreeable to.    PT Next Visit Plan L-Dex screens every 3 months    Consulted and Agree with Plan of Care Patient           Patient will benefit from skilled therapeutic intervention in order to improve the following deficits and impairments:     Visit Diagnosis: Aftercare following surgery for neoplasm     Problem List Patient Active Problem List   Diagnosis Date Noted  . Port-A-Cath in place 09/12/2020  . Genetic testing 07/11/2020  . Family history of  breast cancer 07/05/2020  . Family history of prostate cancer 07/05/2020  . Family history of pancreatic cancer 07/05/2020  . Malignant neoplasm of upper-outer quadrant of left breast in female, estrogen receptor negative ([place]) 06/30/2020  . 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/23/2019  . Primary osteoarthritis of both hands 06/23/2019  . Trigger thumb of left hand 06/23/2019  . Polyarthralgia 06/23/2019  . Hypertension 10/30/2018  . Atypical chest pain 10/30/2018  . Migraine 10/30/2018  . Hypothyroidism 10/30/2018  . Osteoporosis 10/30/2018  . GERD (gastroesophageal reflux disease) 10/30/2018  . Anxiety in acute stress reaction 10/30/2018  . Allergic rhinitis 10/30/2018  . Vitamin D deficiency 10/30/2018  . 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    [name], PTA 11/20/2020, 12:05 PM  [place], [place], [address] Phone: [phone]   Fax:  [phone]  Name: Avalynn Bowe MRN: [id number] Date of Birth: 01-25-1946

## 2020-11-27 ENCOUNTER — Other Ambulatory Visit: Payer: Self-pay | Admitting: *Deleted

## 2020-11-27 DIAGNOSIS — Z95828 Presence of other vascular implants and grafts: Secondary | ICD-10-CM

## 2020-11-27 NOTE — Progress Notes (Signed)
Patient Care Team: Emeterio Reeve, DO as PCP - General (Osteopathic Medicine) Mauro Kaufmann, RN as Oncology Nurse Navigator Rockwell Germany, RN as Oncology Nurse Navigator Stark Klein, MD as Consulting Physician (General Surgery) Nicholas Lose, MD as Consulting Physician (Hematology and Oncology) Eppie Gibson, MD as Attending Physician (Radiation Oncology)  DIAGNOSIS:    ICD-10-CM   1. Malignant neoplasm of upper-outer quadrant of left breast in female, estrogen receptor negative ([place])  C50.412    Z17.1     SUMMARY OF ONCOLOGIC HISTORY: Oncology History  Malignant neoplasm of upper-outer quadrant of left breast in female, estrogen receptor negative ([place])  06/30/2020 Initial Diagnosis   Screening mammogram showed a possible left breast mass. Diagnostic mammogram showed two adjacent masses at the 1 o'clock position, 0.7cm and 0.8cm, about 0.5cm apart, and spanning in total 1.8cm, no left axillary adenopathy. Biopsy showed IDC with squamous differentiation and DCIS, grade 3, HER-2 negative (1+), ER/PR negative, Ki67 80%.    07/05/2020 Cancer Staging   Staging form: Breast, AJCC 8th Edition - Clinical stage from 07/05/2020: Stage IB (cT1c, cN0, cM0, G3, ER-, PR-, HER2-) - Signed by Nicholas Lose, MD on 07/05/2020   07/11/2020 Genetic Testing   Negative genetic testing: no mutations detected in [facility] Common Hereditary Cancers Panel.  The report date is July 11, 2020.   The Common Hereditary Cancers Panel offered by [facility] includes sequencing and/or deletion duplication testing of the following 48 genes: APC, ATM, AXIN2, BARD1, BMPR1A, BRCA1, BRCA2, BRIP1, CDH1, CDK4, CDKN2A (p14ARF), CDKN2A (p16INK4a), CHEK2, CTNNA1, DICER1, EPCAM (Deletion/duplication testing only), GREM1 (promoter region deletion/duplication testing only), KIT, MEN1, MLH1, MSH2, MSH3, MSH6, MUTYH, NBN, NF1, NHTL1, PALB2, PDGFRA, PMS2, POLD1, POLE, PTEN, RAD50, RAD51C, RAD51D, RNF43, SDHB, SDHC, SDHD, SMAD4,  SMARCA4. STK11, TP53, TSC1, TSC2, and VHL.  The following genes were evaluated for sequence changes only: SDHA and HOXB13 c.251G>A variant only.   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   09/05/2020 -  Chemotherapy    Patient is on Treatment Plan: BREAST ADJUVANT CMF IV Q21D        CHIEF COMPLIANT: Cycle5Day 1CMF  INTERVAL HISTORY: Cindy [name] is a 75 y.o. with above-mentioned history of invasive ductal carcinoma of the left breastwhounderwent a left lumpectomyand is currently on adjuvant chemotherapy with CMF.She presents to the clinic todayfor a toxicity checkandcycle5.   She complains of feeling more tired than her usual.  She gets short of breath with exertion.  She is also noticed some blurred vision lately.  Denies any nausea or vomiting.  ALLERGIES:  is allergic to sulfamethoxazole-trimethoprim and tetracyclines & related.  MEDICATIONS:  Current Outpatient Medications  Medication Sig Dispense Refill   acetaminophen (TYLENOL) 500 MG tablet Take 1,000 mg by mouth every 6 (six) hours as needed.     celecoxib (CELEBREX) 200 MG capsule One to 2 tablets by mouth daily as needed for pain. (Patient taking differently: Take 200-400 mg by mouth daily as needed for moderate pain. ) 180 capsule 3   cetirizine (ZYRTEC) 10 MG tablet Take 10 mg by mouth daily as needed for allergies.     cholecalciferol (VITAMIN D3) 25 MCG (1000 UNIT) tablet Take 2,000 Units by mouth daily.     cycloSPORINE (RESTASIS) 0.05 % ophthalmic emulsion Place 1 drop into both eyes 2 (two) times daily as needed (dry eyes).     denosumab (PROLIA) 60  MG/ML SOSY injection Inject into the skin every 6 (six) months.     diclofenac sodium (VOLTAREN) 1 % GEL Apply 4 g topically 4 (four) times daily. (Patient taking differently: Apply 2 g topically 4 (four)  times daily as needed (pain). ) 100 g 11   escitalopram (LEXAPRO) 10 MG tablet Take 5 mg by mouth daily.     esomeprazole (NEXIUM) 40 MG capsule Take 1 capsule by mouth once daily 90 capsule 0   hydrochlorothiazide (HYDRODIURIL) 25 MG tablet Take 1 tablet (25 mg total) by mouth daily. As need for swelling (Patient taking differently: Take 25 mg by mouth daily as needed (swelling). ) 90 tablet 0   levothyroxine (SYNTHROID) 75 MCG tablet Take 1 tablet (75 mcg total) by mouth daily before breakfast. Take on Days 1-6 and skip Day 7- NEED ANNUAL EXAM AND LABS (Patient taking differently: Take 75 mcg by mouth See admin instructions. Take on Days 1-6 (Mon - Sat) and skip Day 7 (Sundays) - NEED ANNUAL EXAM AND LABS) 90 tablet 2   lidocaine-prilocaine (EMLA) cream Apply to affected area once 30 g 3   montelukast (SINGULAIR) 10 MG tablet Take 1 tablet (10 mg total) by mouth at bedtime. (Patient taking differently: Take 10 mg by mouth at bedtime as needed (allergies). ) 90 tablet 1   Multiple Vitamins-Minerals (THRIVE FOR LIFE WOMENS PO) Take 1 tablet by mouth every morning. Also Thrive patch and Thrive milkshake daily     NIFEdipine (PROCARDIA-XL/NIFEDICAL-XL) 30 MG 24 hr tablet Take 1 tablet by mouth once daily (Patient taking differently: Take 30 mg by mouth daily. ) 90 tablet 2   nitroGLYCERIN (NITROSTAT) 0.4 MG SL tablet Place 1 tablet (0.4 mg total) under the tongue every 5 (five) minutes as needed for chest pain. 20 tablet 1   ondansetron (ZOFRAN) 8 MG tablet Take 1 tablet (8 mg total) by mouth 2 (two) times daily as needed for refractory nausea / vomiting. Start on day 3 after chemotherapy. 30 tablet 1   Probiotic Product (PROBIOTIC PO) Take 1 capsule by mouth daily.     prochlorperazine (COMPAZINE) 10 MG tablet Take 1 tablet (10 mg total) by mouth every 6 (six) hours as needed (Nausea or vomiting). 30 tablet 1   rosuvastatin (CRESTOR) 20 MG tablet Take 1 tablet (20 mg total) by mouth daily.  90 tablet 3   SUMAtriptan (IMITREX) 100 MG tablet Take 0.5-1 tablets (50-100 mg total) by mouth every 2 (two) hours as needed for migraine (Max 2 pills per 24 hours). 10 tablet 3   vitamin B-12 (CYANOCOBALAMIN) 500 MCG tablet Take 500 mcg by mouth daily.      No current facility-administered medications for this visit.    PHYSICAL EXAMINATION: ECOG PERFORMANCE STATUS: 1 - Symptomatic but completely ambulatory  Vitals:   11/28/20 1121  BP: 129/79  Pulse: (!) 101  Resp: 20  Temp: (!) 97.5 F (36.4 C)  SpO2: 99%   Filed Weights   11/28/20 1121  Weight: 152 lb 6.4 oz (69.1 kg)     LABORATORY DATA:  I have reviewed the data as listed CMP Latest Ref Rng & Units 11/07/2020 10/17/2020 09/26/2020  Glucose 70 - 99 mg/dL 94 96 119(H)  BUN 8 - 23 mg/dL _0 Creatinine 0.44 - 1.00 mg/dL 0.93 0.88 1.01(H)  Sodium 135 - 145 mmol/L 142 139 141  Potassium 3.5 - 5.1 mmol/L 4.7 4.7 4.2  Chloride 98 - 111 mmol/L 109 107 108  CO2 22 -  32 mmol/L _0 Calcium 8.9 - 10.3 mg/dL 8.3(L) 8.9 8.8(L)  Total Protein 6.5 - 8.1 g/dL 6.2(L) 6.6 6.5  Total Bilirubin 0.3 - 1.2 mg/dL 0.5 0.4 0.4  Alkaline Phos 38 - 126 U/L 70 95 89  AST 15 - 41 U/L _1 ALT 0 - 44 U/L _2 Lab Results  Component Value Date   WBC 2.7 (L) 11/28/2020   HGB 12.6 11/28/2020   HCT 37.8 11/28/2020   MCV 94.3 11/28/2020   PLT 215 11/28/2020   NEUTROABS 1.2 (L) 11/28/2020    ASSESSMENT & PLAN:  Malignant neoplasm of upper-outer quadrant of left breast in female, estrogen receptor negative ([place]) 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ER/PR negative, Ki-67 80%, HER-2 negative  Treatment plan: 1.adjuvant chemotherapywith CMF x6 cyclesplan to start treatment 09/05/2020 2.Follow-up adjuvant  radiation ------------------------------------------------------------------------------------------------------------------------------------------------------ Current treatment: Cycle5CMF Labs have been reviewed  Chemotoxicities: 1.Mild nausea: Improved with Compazine 2.shortness of breath to exertion:  Most likely it is from fatigue related to chemotherapy. 3.  Thinning of hair 4.  ANC today 1.2: Okay to treat.  I reduced the dosage of her chemotherapy.  I also requested for a Udenyca injection.  We will try to see if we can get her authorized for it.  If it cannot be authorized in the next 48 hours she could get the shot either on Friday or even Saturday.    Return to clinic in3weeks for cycle 5.    No orders of the defined types were placed in this encounter.  The patient has a good understanding of the overall plan. she agrees with it. she will call with any problems that may develop before the next visit here.  Total time spent: 30 mins including face to face time and time spent for planning, charting and coordination of care  Rulon Eisenmenger, MD, MPH 11/28/2020  I, Cloyde Reams Dorshimer, am acting as scribe for Dr. Nicholas Lose.  I have reviewed the above documentation for accuracy and completeness, and I agree with the above.

## 2020-11-27 NOTE — Progress Notes (Signed)
Needing port a cath tip placement.  Verbal orders received to order chest xray to be preformed tomorrow to verify tip placement. Pt notified and verbalized understanding.

## 2020-11-28 ENCOUNTER — Ambulatory Visit (HOSPITAL_COMMUNITY)
Admission: RE | Admit: 2020-11-28 | Discharge: 2020-11-28 | Disposition: A | Discharge status: Home / Self Care | Payer: Medicare Other | Source: Ambulatory Visit | Attending: Hematology and Oncology | Admitting: Hematology and Oncology

## 2020-11-28 ENCOUNTER — Inpatient Hospital Stay: Payer: Medicare Other

## 2020-11-28 ENCOUNTER — Inpatient Hospital Stay (HOSPITAL_BASED_OUTPATIENT_CLINIC_OR_DEPARTMENT_OTHER): Payer: Medicare Other | Admitting: Hematology and Oncology

## 2020-11-28 ENCOUNTER — Other Ambulatory Visit: Payer: Self-pay

## 2020-11-28 ENCOUNTER — Telehealth: Payer: Self-pay | Admitting: Hematology and Oncology

## 2020-11-28 ENCOUNTER — Encounter: Payer: Self-pay | Admitting: Hematology and Oncology

## 2020-11-28 VITALS — HR 82

## 2020-11-28 DIAGNOSIS — Z79899 Other long term (current) drug therapy: Secondary | ICD-10-CM | POA: Diagnosis not present

## 2020-11-28 DIAGNOSIS — Z95828 Presence of other vascular implants and grafts: Secondary | ICD-10-CM

## 2020-11-28 DIAGNOSIS — C50412 Malignant neoplasm of upper-outer quadrant of left female breast: Secondary | ICD-10-CM

## 2020-11-28 DIAGNOSIS — Z452 Encounter for adjustment and management of vascular access device: Secondary | ICD-10-CM | POA: Diagnosis not present

## 2020-11-28 DIAGNOSIS — M810 Age-related osteoporosis without current pathological fracture: Secondary | ICD-10-CM

## 2020-11-28 DIAGNOSIS — Z5189 Encounter for other specified aftercare: Secondary | ICD-10-CM | POA: Diagnosis not present

## 2020-11-28 DIAGNOSIS — Z171 Estrogen receptor negative status [ER-]: Secondary | ICD-10-CM

## 2020-11-28 DIAGNOSIS — Z5111 Encounter for antineoplastic chemotherapy: Secondary | ICD-10-CM | POA: Diagnosis not present

## 2020-11-28 LAB — CBC WITH DIFFERENTIAL (CANCER CENTER ONLY)
Abs Immature Granulocytes: 0.02 10*3/uL (ref 0.00–0.07)
Basophils Absolute: 0 10*3/uL (ref 0.0–0.1)
Basophils Relative: 1 %
Eosinophils Absolute: 0.1 10*3/uL (ref 0.0–0.5)
Eosinophils Relative: 4 %
HCT: 37.8 % (ref 36.0–46.0)
Hemoglobin: 12.6 g/dL (ref 12.0–15.0)
Immature Granulocytes: 1 %
Lymphocytes Relative: 26 %
Lymphs Abs: 0.7 10*3/uL (ref 0.7–4.0)
MCH: 31.4 pg (ref 26.0–34.0)
MCHC: 33.3 g/dL (ref 30.0–36.0)
MCV: 94.3 fL (ref 80.0–100.0)
Monocytes Absolute: 0.6 10*3/uL (ref 0.1–1.0)
Monocytes Relative: 24 %
Neutro Abs: 1.2 10*3/uL — ABNORMAL LOW (ref 1.7–7.7)
Neutrophils Relative %: 44 %
Platelet Count: 215 10*3/uL (ref 150–400)
RBC: 4.01 MIL/uL (ref 3.87–5.11)
RDW: 14.1 % (ref 11.5–15.5)
WBC Count: 2.7 10*3/uL — ABNORMAL LOW (ref 4.0–10.5)
nRBC: 0 % (ref 0.0–0.2)

## 2020-11-28 LAB — CMP (CANCER CENTER ONLY)
ALT: 16 U/L (ref 0–44)
AST: 24 U/L (ref 15–41)
Albumin: 3.7 g/dL (ref 3.5–5.0)
Alkaline Phosphatase: 75 U/L (ref 38–126)
Anion gap: 9 (ref 5–15)
BUN: 12 mg/dL (ref 8–23)
CO2: 23 mmol/L (ref 22–32)
Calcium: 8.6 mg/dL — ABNORMAL LOW (ref 8.9–10.3)
Chloride: 109 mmol/L (ref 98–111)
Creatinine: 0.94 mg/dL (ref 0.44–1.00)
GFR, Estimated: >60 mL/min (ref >60)
Glucose, Bld: 140 mg/dL — ABNORMAL HIGH (ref 70–99)
Potassium: 4.1 mmol/L (ref 3.5–5.1)
Sodium: 141 mmol/L (ref 135–145)
Total Bilirubin: 0.5 mg/dL (ref 0.3–1.2)
Total Protein: 6.4 g/dL — ABNORMAL LOW (ref 6.5–8.1)

## 2020-11-28 MED ORDER — PALONOSETRON HCL INJECTION 0.25 MG/5ML
0.2500 mg | Freq: Once | INTRAVENOUS | Status: AC
  Administered 2020-11-28: 0.25 mg via INTRAVENOUS

## 2020-11-28 MED ORDER — SODIUM CHLORIDE 0.9 % IV SOLN
10.0000 mg | Freq: Once | INTRAVENOUS | Status: AC
  Administered 2020-11-28: 10 mg via INTRAVENOUS
  Filled 2020-11-28: qty 10

## 2020-11-28 MED ORDER — HEPARIN SOD (PORK) LOCK FLUSH 100 UNIT/ML IV SOLN
500.0000 [IU] | Freq: Once | INTRAVENOUS | Status: AC | PRN
  Administered 2020-11-28: 500 [IU]
  Filled 2020-11-28: qty 5

## 2020-11-28 MED ORDER — SODIUM CHLORIDE 0.9 % IV SOLN
500.0000 mg/m2 | Freq: Once | INTRAVENOUS | Status: AC
  Administered 2020-11-28: 880 mg via INTRAVENOUS
  Filled 2020-11-28: qty 44

## 2020-11-28 MED ORDER — FLUOROURACIL CHEMO INJECTION 2.5 GM/50ML
400.0000 mg/m2 | Freq: Once | INTRAVENOUS | Status: AC
  Administered 2020-11-28: 700 mg via INTRAVENOUS
  Filled 2020-11-28: qty 14

## 2020-11-28 MED ORDER — METHOTREXATE SODIUM (PF) CHEMO INJECTION 250 MG/10ML
30.2500 mg/m2 | Freq: Once | INTRAMUSCULAR | Status: AC
  Administered 2020-11-28: 53 mg via INTRAVENOUS
  Filled 2020-11-28: qty 2.12

## 2020-11-28 MED ORDER — SODIUM CHLORIDE 0.9 % IV SOLN
Freq: Once | INTRAVENOUS | Status: AC
  Filled 2020-11-28: qty 250

## 2020-11-28 MED ORDER — SODIUM CHLORIDE 0.9% FLUSH
10.0000 mL | INTRAVENOUS | Status: DC | PRN
  Administered 2020-11-28: 10 mL
  Filled 2020-11-28: qty 10

## 2020-11-28 MED ORDER — SODIUM CHLORIDE 0.9% FLUSH
10.0000 mL | Freq: Once | INTRAVENOUS | Status: AC
  Administered 2020-11-28: 10 mL
  Filled 2020-11-28: qty 10

## 2020-11-28 MED ORDER — PALONOSETRON HCL INJECTION 0.25 MG/5ML
INTRAVENOUS | Status: AC
  Filled 2020-11-28: qty 5

## 2020-11-28 NOTE — Assessment & Plan Note (Signed)
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 Most likely it is from fatigue related to chemotherapy. 3.  Thinning of hair 4.  ANC today 1.6: Okay to treat.  TSH has been checked.  We will await the results.  Return to clinic in3weeks for cycle 5.

## 2020-11-28 NOTE — Patient Instructions (Signed)
[facility] Discharge Instructions for Patients Receiving Chemotherapy  Today you received the following chemotherapy agents: Cyclophosphamide (Cytoxan), Methotrexate, and Fluorouracil.  To help prevent nausea and vomiting after your treatment, we encourage you to take your nausea medication as directed by your MD.   If you develop nausea and vomiting that is not controlled by your nausea medication, call the clinic.   BELOW ARE SYMPTOMS THAT SHOULD BE REPORTED IMMEDIATELY:  *FEVER GREATER THAN 100.5 F  *CHILLS WITH OR WITHOUT FEVER  NAUSEA AND VOMITING THAT IS NOT CONTROLLED WITH YOUR NAUSEA MEDICATION  *UNUSUAL SHORTNESS OF BREATH  *UNUSUAL BRUISING OR BLEEDING  TENDERNESS IN MOUTH AND THROAT WITH OR WITHOUT PRESENCE OF ULCERS  *URINARY PROBLEMS  *BOWEL PROBLEMS  UNUSUAL RASH Items with * indicate a potential emergency and should be followed up as soon as possible.  Feel free to call the clinic should you have any questions or concerns. The clinic phone number is [phone].  Please show the [place] at check-in to the Emergency Department and triage nurse.

## 2020-11-28 NOTE — Patient Instructions (Signed)
Implanted Port Insertion, Care After This sheet gives you information about how to care for yourself after your procedure. Your health care provider may also give you more specific instructions. If you have problems or questions, contact your health care provider. What can I expect after the procedure? After the procedure, it is common to have:  Discomfort at the port insertion site.  Bruising on the skin over the port. This should improve over 3-4 days. Follow these instructions at home: Port care  After your port is placed, you will get a manufacturer's information card. The card has information about your port. Keep this card with you at all times.  Take care of the port as told by your health care provider. Ask your health care provider if you or a family member can get training for taking care of the port at home. A home health care nurse may also take care of the port.  Make sure to remember what type of port you have. Incision care  Follow instructions from your health care provider about how to take care of your port insertion site. Make sure you: ? Wash your hands with soap and water before and after you change your bandage (dressing). If soap and water are not available, use hand sanitizer. ? Change your dressing as told by your health care provider. ? Leave stitches (sutures), skin glue, or adhesive strips in place. These skin closures may need to stay in place for 2 weeks or longer. If adhesive strip edges start to loosen and curl up, you may trim the loose edges. Do not remove adhesive strips completely unless your health care provider tells you to do that.  Check your port insertion site every day for signs of infection. Check for: ? Redness, swelling, or pain. ? Fluid or blood. ? Warmth. ? Pus or a bad smell.      Activity  Return to your normal activities as told by your health care provider. Ask your health care provider what activities are safe for you.  Do not  lift anything that is heavier than 10 lb (4.5 kg), or the limit that you are told, until your health care provider says that it is safe. General instructions  Take over-the-counter and prescription medicines only as told by your health care provider.  Do not take baths, swim, or use a hot tub until your health care provider approves. Ask your health care provider if you may take showers. You may only be allowed to take sponge baths.  Do not drive for 24 hours if you were given a sedative during your procedure.  Wear a medical alert bracelet in case of an emergency. This will tell any health care providers that you have a port.  Keep all follow-up visits as told by your health care provider. This is important. Contact a health care provider if:  You cannot flush your port with saline as directed, or you cannot draw blood from the port.  You have a fever or chills.  You have redness, swelling, or pain around your port insertion site.  You have fluid or blood coming from your port insertion site.  Your port insertion site feels warm to the touch.  You have pus or a bad smell coming from the port insertion site. Get help right away if:  You have chest pain or shortness of breath.  You have bleeding from your port that you cannot control. Summary  Take care of the port as told by your   health care provider. Keep the manufacturer's information card with you at all times.  Change your dressing as told by your health care provider.  Contact a health care provider if you have a fever or chills or if you have redness, swelling, or pain around your port insertion site.  Keep all follow-up visits as told by your health care provider. This information is not intended to replace advice given to you by your health care provider. Make sure you discuss any questions you have with your health care provider. Document Revised: 03/24/2018 Document Reviewed: 03/24/2018 Elsevier Patient Education   2021 [employer]  

## 2020-11-28 NOTE — Telephone Encounter (Signed)
Scheduled per sch msg. Called and spoke with patient. Confirmed date and time ° °

## 2020-11-29 ENCOUNTER — Telehealth: Payer: Self-pay | Admitting: Hematology and Oncology

## 2020-11-29 NOTE — Telephone Encounter (Signed)
Scheduled per 3/22 los. Pt will receive an updated appt calendar per next visit appt notes

## 2020-11-30 ENCOUNTER — Other Ambulatory Visit: Payer: Self-pay

## 2020-11-30 ENCOUNTER — Inpatient Hospital Stay: Payer: Medicare Other

## 2020-11-30 VITALS — BP 151/81 | HR 79 | Temp 98.4°F

## 2020-11-30 DIAGNOSIS — Z171 Estrogen receptor negative status [ER-]: Secondary | ICD-10-CM

## 2020-11-30 DIAGNOSIS — C50412 Malignant neoplasm of upper-outer quadrant of left female breast: Secondary | ICD-10-CM | POA: Diagnosis not present

## 2020-11-30 DIAGNOSIS — Z79899 Other long term (current) drug therapy: Secondary | ICD-10-CM | POA: Diagnosis not present

## 2020-11-30 DIAGNOSIS — Z5111 Encounter for antineoplastic chemotherapy: Secondary | ICD-10-CM | POA: Diagnosis not present

## 2020-11-30 DIAGNOSIS — Z5189 Encounter for other specified aftercare: Secondary | ICD-10-CM | POA: Diagnosis not present

## 2020-11-30 MED ORDER — PEGFILGRASTIM-CBQV 6 MG/0.6ML ~~LOC~~ SOSY
PREFILLED_SYRINGE | SUBCUTANEOUS | Status: AC
  Filled 2020-11-30: qty 0.6

## 2020-11-30 MED ORDER — PEGFILGRASTIM-CBQV 6 MG/0.6ML ~~LOC~~ SOSY
6.0000 mg | PREFILLED_SYRINGE | Freq: Once | SUBCUTANEOUS | Status: AC
  Administered 2020-11-30: 6 mg via SUBCUTANEOUS

## 2020-12-18 NOTE — Progress Notes (Signed)
Patient Care Team: Emeterio Reeve, DO as PCP - General (Osteopathic Medicine) Mauro Kaufmann, RN as Oncology Nurse Navigator Rockwell Germany, RN as Oncology Nurse Navigator Stark Klein, MD as Consulting Physician (General Surgery) Nicholas Lose, MD as Consulting Physician (Hematology and Oncology) Eppie Gibson, MD as Attending Physician (Radiation Oncology)  DIAGNOSIS:    ICD-10-CM   1. Malignant neoplasm of upper-outer quadrant of left breast in female, estrogen receptor negative ([place])  C50.412    Z17.1     SUMMARY OF ONCOLOGIC HISTORY: Oncology History  Malignant neoplasm of upper-outer quadrant of left breast in female, estrogen receptor negative ([place])  06/30/2020 Initial Diagnosis   Screening mammogram showed a possible left breast mass. Diagnostic mammogram showed two adjacent masses at the 1 o'clock position, 0.7cm and 0.8cm, about 0.5cm apart, and spanning in total 1.8cm, no left axillary adenopathy. Biopsy showed IDC with squamous differentiation and DCIS, grade 3, HER-2 negative (1+), ER/PR negative, Ki67 80%.    07/05/2020 Cancer Staging   Staging form: Breast, AJCC 8th Edition - Clinical stage from 07/05/2020: Stage IB (cT1c, cN0, cM0, G3, ER-, PR-, HER2-) - Signed by Nicholas Lose, MD on 07/05/2020   07/11/2020 Genetic Testing   Negative genetic testing: no mutations detected in [facility] Common Hereditary Cancers Panel.  The report date is July 11, 2020.   The Common Hereditary Cancers Panel offered by [facility] includes sequencing and/or deletion duplication testing of the following 48 genes: APC, ATM, AXIN2, BARD1, BMPR1A, BRCA1, BRCA2, BRIP1, CDH1, CDK4, CDKN2A (p14ARF), CDKN2A (p16INK4a), CHEK2, CTNNA1, DICER1, EPCAM (Deletion/duplication testing only), GREM1 (promoter region deletion/duplication testing only), KIT, MEN1, MLH1, MSH2, MSH3, MSH6, MUTYH, NBN, NF1, NHTL1, PALB2, PDGFRA, PMS2, POLD1, POLE, PTEN, RAD50, RAD51C, RAD51D, RNF43, SDHB, SDHC, SDHD, SMAD4,  SMARCA4. STK11, TP53, TSC1, TSC2, and VHL.  The following genes were evaluated for sequence changes only: SDHA and HOXB13 c.251G>A variant only.   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   09/05/2020 -  Chemotherapy    Patient is on Treatment Plan: BREAST ADJUVANT CMF IV Q21D        CHIEF COMPLIANT: Cycle6Day 1CMF  INTERVAL HISTORY: Cindy [name] is a 75 y.o. with above-mentioned history of invasive ductal carcinoma of the left breastwhounderwent a left lumpectomyand is currently on adjuvant chemotherapy with CMF.She presents to the clinic todayfor a toxicity checkandcycle6. Her major complaint todayHer major complaint today is worsening fatigue. No nausea or vomiting. Alternating diarrhea and constipation  ALLERGIES:  is allergic to sulfamethoxazole-trimethoprim and tetracyclines & related.  MEDICATIONS:  Current Outpatient Medications  Medication Sig Dispense Refill  . acetaminophen (TYLENOL) 500 MG tablet Take 1,000 mg by mouth every 6 (six) hours as needed.    . celecoxib (CELEBREX) 200 MG capsule One to 2 tablets by mouth daily as needed for pain. (Patient taking differently: Take 200-400 mg by mouth daily as needed for moderate pain. ) 180 capsule 3  . cetirizine (ZYRTEC) 10 MG tablet Take 10 mg by mouth daily as needed for allergies.    . cholecalciferol (VITAMIN D3) 25 MCG (1000 UNIT) tablet Take 2,000 Units by mouth daily.    . cycloSPORINE (RESTASIS) 0.05 % ophthalmic emulsion Place 1 drop into both eyes 2 (two) times daily as needed (dry eyes).    Marland Kitchen denosumab (PROLIA) 60 MG/ML SOSY injection Inject into the skin every 6 (six) months.    . diclofenac  sodium (VOLTAREN) 1 % GEL Apply 4 g topically 4 (four) times daily. (Patient taking differently: Apply 2 g topically 4 (four) times daily as needed (pain). ) 100 g 11  .  escitalopram (LEXAPRO) 10 MG tablet Take 5 mg by mouth daily.    Marland Kitchen esomeprazole (NEXIUM) 40 MG capsule Take 1 capsule by mouth once daily 90 capsule 0  . hydrochlorothiazide (HYDRODIURIL) 25 MG tablet Take 1 tablet (25 mg total) by mouth daily. As need for swelling (Patient taking differently: Take 25 mg by mouth daily as needed (swelling). ) 90 tablet 0  . levothyroxine (SYNTHROID) 75 MCG tablet Take 1 tablet (75 mcg total) by mouth daily before breakfast. Take on Days 1-6 and skip Day 7- NEED ANNUAL EXAM AND LABS (Patient taking differently: Take 75 mcg by mouth See admin instructions. Take on Days 1-6 (Mon - Sat) and skip Day 7 (Sundays) - NEED ANNUAL EXAM AND LABS) 90 tablet 2  . lidocaine-prilocaine (EMLA) cream Apply to affected area once 30 g 3  . montelukast (SINGULAIR) 10 MG tablet Take 1 tablet (10 mg total) by mouth at bedtime. (Patient taking differently: Take 10 mg by mouth at bedtime as needed (allergies). ) 90 tablet 1  . Multiple Vitamins-Minerals (THRIVE FOR LIFE WOMENS PO) Take 1 tablet by mouth every morning. Also Thrive patch and Thrive milkshake daily    . NIFEdipine (PROCARDIA-XL/NIFEDICAL-XL) 30 MG 24 hr tablet Take 1 tablet by mouth once daily (Patient taking differently: Take 30 mg by mouth daily. ) 90 tablet 2  . nitroGLYCERIN (NITROSTAT) 0.4 MG SL tablet Place 1 tablet (0.4 mg total) under the tongue every 5 (five) minutes as needed for chest pain. 20 tablet 1  . ondansetron (ZOFRAN) 8 MG tablet Take 1 tablet (8 mg total) by mouth 2 (two) times daily as needed for refractory nausea / vomiting. Start on day 3 after chemotherapy. 30 tablet 1  . Probiotic Product (PROBIOTIC PO) Take 1 capsule by mouth daily.    . prochlorperazine (COMPAZINE) 10 MG tablet Take 1 tablet (10 mg total) by mouth every 6 (six) hours as needed (Nausea or vomiting). 30 tablet 1  . rosuvastatin (CRESTOR) 20 MG tablet Take 1 tablet (20 mg total) by mouth daily. 90 tablet 3  . SUMAtriptan (IMITREX) 100 MG  tablet Take 0.5-1 tablets (50-100 mg total) by mouth every 2 (two) hours as needed for migraine (Max 2 pills per 24 hours). 10 tablet 3  . vitamin B-12 (CYANOCOBALAMIN) 500 MCG tablet Take 500 mcg by mouth daily.      No current facility-administered medications for this visit.    PHYSICAL EXAMINATION: ECOG PERFORMANCE STATUS: 2 - Symptomatic, <50% confined to bed  Vitals:   12/19/20 1118  BP: 109/73  Pulse: 93  Resp: 18  Temp: (!) 97.5 F (36.4 C)  SpO2: 94%   Filed Weights   12/19/20 1118  Weight: 149 lb 4.8 oz (67.7 kg)     LABORATORY DATA:  I have reviewed the data as listed CMP Latest Ref Rng & Units 12/19/2020 11/28/2020 11/07/2020  Glucose 70 - 99 mg/dL 103(H) 140(H) 94  BUN 8 - 23 mg/dL 23 12 14   Creatinine 0.44 - 1.00 mg/dL 0.93 0.94 0.93  Sodium 135 - 145 mmol/L 142 141 142  Potassium 3.5 - 5.1 mmol/L 4.3 4.1 4.7  Chloride 98 - 111 mmol/L 107 109 109  CO2 22 - 32 mmol/L 25 23 24   Calcium 8.9 - 10.3 mg/dL 8.9 8.6(L) 8.3(L)  Total  Protein 6.5 - 8.1 g/dL 6.2(L) 6.4(L) 6.2(L)  Total Bilirubin 0.3 - 1.2 mg/dL 0.6 0.5 0.5  Alkaline Phos 38 - 126 U/L 76 75 70  AST 15 - 41 U/L 20 24 23   ALT 0 - 44 U/L 11 16 17     Lab Results  Component Value Date   WBC 4.4 12/19/2020   HGB 12.1 12/19/2020   HCT 36.2 12/19/2020   MCV 95.8 12/19/2020   PLT 195 12/19/2020   NEUTROABS 2.7 12/19/2020    ASSESSMENT & PLAN:  Malignant neoplasm of upper-outer quadrant of left breast in female, estrogen receptor negative ([place]) 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ER/PR negative, Ki-67 80%, HER-2 negative  Treatment plan: 1.adjuvant chemotherapywith CMF x6 cyclesplan to start treatment 09/05/2020 2.Follow-up adjuvant  radiation ------------------------------------------------------------------------------------------------------------------------------------------------------ Current treatment: Cycle6CMF Labs have been reviewed  Chemotoxicities: 1.Mild nausea: Improved with Compazine 2.shortness of breath to exertion: Most likely it is from fatigue related to chemotherapy. 3.Thinning of hair 4.ANC today 2.7: Okay to treat.   Neulasta ink 5. Alternating constipation and diarrhea   Today's chemo will conclude her treatment.  She will then move onto radiation.   RTC after XRT is complete  No orders of the defined types were placed in this encounter.  The patient has a good understanding of the overall plan. she agrees with it. she will call with any problems that may develop before the next visit here.  Total time spent: 30 mins including face to face time and time spent for planning, charting and coordination of care  Rulon Eisenmenger, MD, MPH 12/19/2020  I, Molly Dorshimer, am acting as scribe for Dr. Nicholas Lose.  I have reviewed the above documentation for accuracy and completeness, and I agree with the above.

## 2020-12-19 ENCOUNTER — Inpatient Hospital Stay: Payer: Medicare Other

## 2020-12-19 ENCOUNTER — Inpatient Hospital Stay (HOSPITAL_BASED_OUTPATIENT_CLINIC_OR_DEPARTMENT_OTHER): Payer: Medicare Other | Admitting: Hematology and Oncology

## 2020-12-19 ENCOUNTER — Other Ambulatory Visit: Payer: Self-pay

## 2020-12-19 ENCOUNTER — Inpatient Hospital Stay: Payer: Medicare Other | Attending: Hematology and Oncology

## 2020-12-19 ENCOUNTER — Encounter: Payer: Self-pay | Admitting: *Deleted

## 2020-12-19 DIAGNOSIS — Z5189 Encounter for other specified aftercare: Secondary | ICD-10-CM | POA: Insufficient documentation

## 2020-12-19 DIAGNOSIS — Z171 Estrogen receptor negative status [ER-]: Secondary | ICD-10-CM | POA: Diagnosis not present

## 2020-12-19 DIAGNOSIS — C50412 Malignant neoplasm of upper-outer quadrant of left female breast: Secondary | ICD-10-CM | POA: Diagnosis not present

## 2020-12-19 DIAGNOSIS — Z5111 Encounter for antineoplastic chemotherapy: Secondary | ICD-10-CM | POA: Diagnosis not present

## 2020-12-19 DIAGNOSIS — Z170 Estrogen receptor positive status [ER+]: Secondary | ICD-10-CM | POA: Diagnosis not present

## 2020-12-19 LAB — CBC WITH DIFFERENTIAL (CANCER CENTER ONLY)
Abs Immature Granulocytes: 0.01 10*3/uL (ref 0.00–0.07)
Basophils Absolute: 0.1 10*3/uL (ref 0.0–0.1)
Basophils Relative: 2 %
Eosinophils Absolute: 0.1 10*3/uL (ref 0.0–0.5)
Eosinophils Relative: 2 %
HCT: 36.2 % (ref 36.0–46.0)
Hemoglobin: 12.1 g/dL (ref 12.0–15.0)
Immature Granulocytes: 0 %
Lymphocytes Relative: 18 %
Lymphs Abs: 0.8 10*3/uL (ref 0.7–4.0)
MCH: 32 pg (ref 26.0–34.0)
MCHC: 33.4 g/dL (ref 30.0–36.0)
MCV: 95.8 fL (ref 80.0–100.0)
Monocytes Absolute: 0.7 10*3/uL (ref 0.1–1.0)
Monocytes Relative: 17 %
Neutro Abs: 2.7 10*3/uL (ref 1.7–7.7)
Neutrophils Relative %: 61 %
Platelet Count: 195 10*3/uL (ref 150–400)
RBC: 3.78 MIL/uL — ABNORMAL LOW (ref 3.87–5.11)
RDW: 14.9 % (ref 11.5–15.5)
WBC Count: 4.4 10*3/uL (ref 4.0–10.5)
nRBC: 0 % (ref 0.0–0.2)

## 2020-12-19 LAB — CMP (CANCER CENTER ONLY)
ALT: 11 U/L (ref 0–44)
AST: 20 U/L (ref 15–41)
Albumin: 3.7 g/dL (ref 3.5–5.0)
Alkaline Phosphatase: 76 U/L (ref 38–126)
Anion gap: 10 (ref 5–15)
BUN: 23 mg/dL (ref 8–23)
CO2: 25 mmol/L (ref 22–32)
Calcium: 8.9 mg/dL (ref 8.9–10.3)
Chloride: 107 mmol/L (ref 98–111)
Creatinine: 0.93 mg/dL (ref 0.44–1.00)
GFR, Estimated: >60 mL/min (ref >60)
Glucose, Bld: 103 mg/dL — ABNORMAL HIGH (ref 70–99)
Potassium: 4.3 mmol/L (ref 3.5–5.1)
Sodium: 142 mmol/L (ref 135–145)
Total Bilirubin: 0.6 mg/dL (ref 0.3–1.2)
Total Protein: 6.2 g/dL — ABNORMAL LOW (ref 6.5–8.1)

## 2020-12-19 MED ORDER — PALONOSETRON HCL INJECTION 0.25 MG/5ML
0.2500 mg | Freq: Once | INTRAVENOUS | Status: AC
  Administered 2020-12-19: 0.25 mg via INTRAVENOUS

## 2020-12-19 MED ORDER — FLUOROURACIL CHEMO INJECTION 2.5 GM/50ML
400.0000 mg/m2 | Freq: Once | INTRAVENOUS | Status: AC
  Administered 2020-12-19: 700 mg via INTRAVENOUS
  Filled 2020-12-19: qty 14

## 2020-12-19 MED ORDER — HEPARIN SOD (PORK) LOCK FLUSH 100 UNIT/ML IV SOLN
500.0000 [IU] | Freq: Once | INTRAVENOUS | Status: AC | PRN
  Administered 2020-12-19: 500 [IU]
  Filled 2020-12-19: qty 5

## 2020-12-19 MED ORDER — DEXAMETHASONE SODIUM PHOSPHATE 100 MG/10ML IJ SOLN
10.0000 mg | Freq: Once | INTRAMUSCULAR | Status: AC
  Administered 2020-12-19: 10 mg via INTRAVENOUS
  Filled 2020-12-19: qty 10

## 2020-12-19 MED ORDER — SODIUM CHLORIDE 0.9% FLUSH
10.0000 mL | INTRAVENOUS | Status: DC | PRN
Start: 2020-12-19 — End: 2020-12-19
  Administered 2020-12-19: 10 mL
  Filled 2020-12-19: qty 10

## 2020-12-19 MED ORDER — SODIUM CHLORIDE 0.9 % IV SOLN
Freq: Once | INTRAVENOUS | Status: AC
Start: 2020-12-19 — End: 2020-12-19
  Filled 2020-12-19: qty 250

## 2020-12-19 MED ORDER — SODIUM CHLORIDE 0.9 % IV SOLN
500.0000 mg/m2 | Freq: Once | INTRAVENOUS | Status: AC
  Administered 2020-12-19: 880 mg via INTRAVENOUS
  Filled 2020-12-19: qty 44

## 2020-12-19 MED ORDER — METHOTREXATE SODIUM (PF) CHEMO INJECTION 250 MG/10ML
30.2500 mg/m2 | Freq: Once | INTRAMUSCULAR | Status: AC
  Administered 2020-12-19: 53 mg via INTRAVENOUS
  Filled 2020-12-19: qty 2.12

## 2020-12-19 NOTE — Patient Instructions (Signed)
[facility] Discharge Instructions for Patients Receiving Chemotherapy  Today you received the following chemotherapy agents: cyclophosphamide, methotrexate, and fluorouracil.  To help prevent nausea and vomiting after your treatment, we encourage you to take your nausea medication as directed.   If you develop nausea and vomiting that is not controlled by your nausea medication, call the clinic.   BELOW ARE SYMPTOMS THAT SHOULD BE REPORTED IMMEDIATELY:  *FEVER GREATER THAN 100.5 F  *CHILLS WITH OR WITHOUT FEVER  NAUSEA AND VOMITING THAT IS NOT CONTROLLED WITH YOUR NAUSEA MEDICATION  *UNUSUAL SHORTNESS OF BREATH  *UNUSUAL BRUISING OR BLEEDING  TENDERNESS IN MOUTH AND THROAT WITH OR WITHOUT PRESENCE OF ULCERS  *URINARY PROBLEMS  *BOWEL PROBLEMS  UNUSUAL RASH Items with * indicate a potential emergency and should be followed up as soon as possible.  Feel free to call the clinic should you have any questions or concerns. The clinic phone number is [phone].  Please show the [facility] at check-in to the Emergency Department and triage nurse.

## 2020-12-19 NOTE — Assessment & Plan Note (Signed)
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ER/PR negative, Ki-67 80%, HER-2 negative  Treatment plan: 1.adjuvant chemotherapywith CMF x6 cyclesplan to start treatment 09/05/2020 2.Follow-up adjuvant radiation ------------------------------------------------------------------------------------------------------------------------------------------------------ Current treatment: Cycle6CMF Labs have been reviewed  Chemotoxicities: 1.Mild nausea: Improved with Compazine 2.shortness of breath to exertion: Most likely it is from fatigue related to chemotherapy. 3.Thinning of hair 4.ANC today 1.2: Okay to treat.  I reduced the dosage of her chemotherapy.  I also requested for a Udenyca injection.  We will try to see if we can get her authorized for it.  If it cannot be authorized in the next 48 hours she could get the shot either on Friday or even Saturday.    Today's chemo will conclude her treatment.  She will then move onto radiation.  

## 2020-12-19 NOTE — Patient Instructions (Signed)

## 2020-12-21 ENCOUNTER — Other Ambulatory Visit: Payer: Self-pay

## 2020-12-21 ENCOUNTER — Inpatient Hospital Stay: Payer: Medicare Other

## 2020-12-21 VITALS — BP 117/76 | HR 98 | Temp 97.8°F | Resp 18

## 2020-12-21 DIAGNOSIS — C50412 Malignant neoplasm of upper-outer quadrant of left female breast: Secondary | ICD-10-CM | POA: Diagnosis not present

## 2020-12-21 DIAGNOSIS — Z170 Estrogen receptor positive status [ER+]: Secondary | ICD-10-CM | POA: Diagnosis not present

## 2020-12-21 DIAGNOSIS — Z5111 Encounter for antineoplastic chemotherapy: Secondary | ICD-10-CM | POA: Diagnosis not present

## 2020-12-21 DIAGNOSIS — Z5189 Encounter for other specified aftercare: Secondary | ICD-10-CM | POA: Diagnosis not present

## 2020-12-21 MED ORDER — PEGFILGRASTIM-CBQV 6 MG/0.6ML ~~LOC~~ SOSY
6.0000 mg | PREFILLED_SYRINGE | Freq: Once | SUBCUTANEOUS | Status: AC
  Administered 2020-12-21: 6 mg via SUBCUTANEOUS

## 2020-12-21 MED ORDER — PEGFILGRASTIM-CBQV 6 MG/0.6ML ~~LOC~~ SOSY
PREFILLED_SYRINGE | SUBCUTANEOUS | Status: AC
  Filled 2020-12-21: qty 0.6

## 2020-12-21 NOTE — Patient Instructions (Signed)
Pegfilgrastim injection What is this medicine? PEGFILGRASTIM (PEG fil gra stim) is a long-acting granulocyte colony-stimulating factor that stimulates the growth of neutrophils, a type of white blood cell important in the body's fight against infection. It is used to reduce the incidence of fever and infection in patients with certain types of cancer who are receiving chemotherapy that affects the bone marrow, and to increase survival after being exposed to high doses of radiation. This medicine may be used for other purposes; ask your health care provider or pharmacist if you have questions. COMMON BRAND NAME(S): Fulphila, Neulasta, Nyvepria, UDENYCA, Ziextenzo What should I tell my health care provider before I take this medicine? They need to know if you have any of these conditions:  kidney disease  latex allergy  ongoing radiation therapy  sickle cell disease  skin reactions to acrylic adhesives (On-Body Injector only)  an unusual or allergic reaction to pegfilgrastim, filgrastim, other medicines, foods, dyes, or preservatives  pregnant or trying to get pregnant  breast-feeding How should I use this medicine? This medicine is for injection under the skin. If you get this medicine at home, you will be taught how to prepare and give the pre-filled syringe or how to use the On-body Injector. Refer to the patient Instructions for Use for detailed instructions. Use exactly as directed. Tell your healthcare provider immediately if you suspect that the On-body Injector may not have performed as intended or if you suspect the use of the On-body Injector resulted in a missed or partial dose. It is important that you put your used needles and syringes in a special sharps container. Do not put them in a trash can. If you do not have a sharps container, call your pharmacist or healthcare provider to get one. Talk to your pediatrician regarding the use of this medicine in children. While this drug  may be prescribed for selected conditions, precautions do apply. Overdosage: If you think you have taken too much of this medicine contact a poison control center or emergency room at once. NOTE: This medicine is only for you. Do not share this medicine with others. What if I miss a dose? It is important not to miss your dose. Call your doctor or health care professional if you miss your dose. If you miss a dose due to an On-body Injector failure or leakage, a new dose should be administered as soon as possible using a single prefilled syringe for manual use. What may interact with this medicine? Interactions have not been studied. This list may not describe all possible interactions. Give your health care provider a list of all the medicines, herbs, non-prescription drugs, or dietary supplements you use. Also tell them if you smoke, drink alcohol, or use illegal drugs. Some items may interact with your medicine. What should I watch for while using this medicine? Your condition will be monitored carefully while you are receiving this medicine. You may need blood work done while you are taking this medicine. Talk to your health care provider about your risk of cancer. You may be more at risk for certain types of cancer if you take this medicine. If you are going to need a MRI, CT scan, or other procedure, tell your doctor that you are using this medicine (On-Body Injector only). What side effects may I notice from receiving this medicine? Side effects that you should report to your doctor or health care professional as soon as possible:  allergic reactions (skin rash, itching or hives, swelling of   the face, lips, or tongue)  back pain  dizziness  fever  pain, redness, or irritation at site where injected  pinpoint red spots on the skin  red or dark-brown urine  shortness of breath or breathing problems  stomach or side pain, or pain at the shoulder  swelling  tiredness  trouble  passing urine or change in the amount of urine  unusual bruising or bleeding Side effects that usually do not require medical attention (report to your doctor or health care professional if they continue or are bothersome):  bone pain  muscle pain This list may not describe all possible side effects. Call your doctor for medical advice about side effects. You may report side effects to FDA at 1-800-FDA-1088. Where should I keep my medicine? Keep out of the reach of children. If you are using this medicine at home, you will be instructed on how to store it. Throw away any unused medicine after the expiration date on the label. NOTE: This sheet is a summary. It may not cover all possible information. If you have questions about this medicine, talk to your doctor, pharmacist, or health care provider.  2021 Elsevier/Gold Standard (2019-09-17 13:20:51)  

## 2020-12-25 ENCOUNTER — Other Ambulatory Visit: Payer: Self-pay | Admitting: General Surgery

## 2020-12-25 ENCOUNTER — Encounter: Payer: Self-pay | Admitting: *Deleted

## 2020-12-26 NOTE — Progress Notes (Addendum)
COVID Vaccine Completed: x2 Date COVID Vaccine completed: 07-05-20 & 07-26-20 Has received booster: COVID vaccine manufacturer: Pfizer      Date of COVID positive in last 90 days:  N/A  PCP - Emeterio Reeve, DO Cardiologist - Kirk Ruths, MD.  Previously saw Dr. Donia Ast in [place]  Chest x-ray - 11-28-20 Epic EKG - 05-10-20 Epic Stress Test - 5+ years ago in [place] ECHO - Pt unsure if echo done Cardiac Cath - N/A Pacemaker/ICD device last checked: Spinal Cord Stimulator: Coronary CT - 05-24-20 Epic  Sleep Study - N/A CPAP -   Fasting Blood Sugar - N/A Checks Blood Sugar _____ times a day  Blood Thinner Instructions:  N/A Aspirin Instructions: Last Dose:  Activity level:  Can go up a flight of stairs and perform activities of daily living without stopping and without symptoms of chest pain.  Patient does have some dyspnea with exertion but can climb a flight of stairs.      Anesthesia review:  Eval for chest pain 2021 in [place].  Evaluated in [place], has experienced chest pain for 20 years.  HTN.   Patient denies shortness of breath, fever, cough and chest pain at PAT appointment.   Patient verbalized understanding of instructions that were given to them at the PAT appointment. Patient was also instructed that they will need to review over the PAT instructions again at home before surgery.

## 2020-12-26 NOTE — Patient Instructions (Addendum)
DUE TO COVID-19 ONLY ONE VISITOR IS ALLOWED TO COME WITH YOU AND STAY IN THE WAITING ROOM ONLY DURING PRE OP AND PROCEDURE.   **NO VISITORS ARE ALLOWED IN THE SHORT STAY AREA OR RECOVERY ROOM!!**    Your procedure is scheduled on: Thursday, 01-04-21   Report to [facility] Main  Entrance   Report to admitting at 10:30 AM   Call this number if you have problems the morning of surgery [phone]   Do not eat food :After Midnight.   May have liquids until 9:30 AM day of surgery  CLEAR LIQUID DIET  Foods Allowed                                                                     Foods Excluded  Water, Black Coffee and tea, regular and decaf             liquids that you cannot  Plain Jell-O in any flavor  (No red)                                    see through such as: Fruit ices (not with fruit pulp)                                      milk, soups, orange juice              Iced Popsicles (No red)                                      All solid food                                   Apple juices Sports drinks like Gatorade (No red) Lightly seasoned clear broth or consume(fat free) Sugar, honey syrup  Oral Hygiene is also important to reduce your risk of infection.                                    Remember - BRUSH YOUR TEETH THE MORNING OF SURGERY WITH YOUR REGULAR TOOTHPASTE   Do NOT smoke after Midnight   Take these medicines the morning of surgery with A SIP OF WATER:  Cetirizine, Lexapro, Nexium, Levothyroxine, Nifedipine, Rosuvastatin                               You may not have any metal on your body including hair pins, jewelry, and body piercings             Do not wear make-up, lotions, powders, perfumes/cologne, or deodorant             Do not wear nail polish.  Do not shave  48 hours prior to surgery.    Do not bring valuables to the hospital. [facility]  NOT RESPONSIBLE   FOR VALUABLES.   Contacts, dentures or bridgework may not be worn into  surgery.   Patients discharged the day of surgery will not be allowed to drive home.               Please read over the following fact sheets you were given: IF YOU HAVE QUESTIONS ABOUT YOUR PRE OP INSTRUCTIONS PLEASE CALL  [facility] - Preparing for Surgery Before surgery, you can play an important role.  Because skin is not sterile, your skin needs to be as free of germs as possible.  You can reduce the number of germs on your skin by washing with CHG (chlorahexidine gluconate) soap before surgery.  CHG is an antiseptic cleaner which kills germs and bonds with the skin to continue killing germs even after washing. Please DO NOT use if you have an allergy to CHG or antibacterial soaps.  If your skin becomes reddened/irritated stop using the CHG and inform your nurse when you arrive at Short Stay. Do not shave (including legs and underarms) for at least 48 hours prior to the first CHG shower.  You may shave your face/neck.  Please follow these instructions carefully:  1.  Shower with CHG Soap the night before surgery and the  morning of surgery.  2.  If you choose to wash your hair, wash your hair first as usual with your normal  shampoo.  3.  After you shampoo, rinse your hair and body thoroughly to remove the shampoo.                             4.  Use CHG as you would any other liquid soap.  You can apply chg directly to the skin and wash.  Gently with a scrungie or clean washcloth.  5.  Apply the CHG Soap to your body ONLY FROM THE NECK DOWN.   Do   not use on face/ open                           Wound or open sores. Avoid contact with eyes, ears mouth and   genitals (private parts).                       Wash face,  Genitals (private parts) with your normal soap.             6.  Wash thoroughly, paying special attention to the area where your    surgery  will be performed.  7.  Thoroughly rinse your body with warm water from the neck down.  8.  DO NOT shower/wash  with your normal soap after using and rinsing off the CHG Soap.                9.  Pat yourself dry with a clean towel.            10.  Wear clean pajamas.            11.  Place clean sheets on your bed the night of your first shower and do not  sleep with pets. Day of Surgery : Do not apply any lotions/deodorants the morning of surgery.  Please wear clean clothes to the hospital/surgery center.  FAILURE TO FOLLOW THESE INSTRUCTIONS MAY RESULT IN THE CANCELLATION OF YOUR SURGERY  PATIENT SIGNATURE_________________________________  NURSE  SIGNATURE__________________________________  ________________________________________________________________________

## 2020-12-30 ENCOUNTER — Other Ambulatory Visit: Payer: Self-pay | Admitting: Osteopathic Medicine

## 2021-01-01 ENCOUNTER — Other Ambulatory Visit (HOSPITAL_COMMUNITY)
Admission: RE | Admit: 2021-01-01 | Discharge: 2021-01-01 | Disposition: A | Discharge status: Home / Self Care | Payer: Medicare Other | Source: Ambulatory Visit | Attending: General Surgery | Admitting: General Surgery

## 2021-01-01 DIAGNOSIS — Z20822 Contact with and (suspected) exposure to covid-19: Secondary | ICD-10-CM | POA: Diagnosis not present

## 2021-01-01 DIAGNOSIS — Z01812 Encounter for preprocedural laboratory examination: Secondary | ICD-10-CM | POA: Diagnosis not present

## 2021-01-02 ENCOUNTER — Encounter (HOSPITAL_COMMUNITY)
Admission: RE | Admit: 2021-01-02 | Discharge: 2021-01-02 | Disposition: A | Discharge status: Home / Self Care | Payer: Medicare Other | Source: Ambulatory Visit | Attending: General Surgery | Admitting: General Surgery

## 2021-01-02 ENCOUNTER — Other Ambulatory Visit: Payer: Self-pay

## 2021-01-02 ENCOUNTER — Encounter (HOSPITAL_COMMUNITY): Payer: Self-pay

## 2021-01-02 LAB — SARS CORONAVIRUS 2 (TAT 6-24 HRS): SARS Coronavirus 2: NEGATIVE

## 2021-01-02 NOTE — Progress Notes (Signed)
Spoke with patient by phone, patient aware surgery located moved to [place], arrive 1030 am [facility] 01-04-2021, follow all other pre op instructions given at 01-02-2021 pre op visit.

## 2021-01-04 ENCOUNTER — Ambulatory Visit (HOSPITAL_BASED_OUTPATIENT_CLINIC_OR_DEPARTMENT_OTHER): Payer: Medicare Other | Admitting: Physician Assistant

## 2021-01-04 ENCOUNTER — Encounter (HOSPITAL_BASED_OUTPATIENT_CLINIC_OR_DEPARTMENT_OTHER): Payer: Self-pay | Admitting: General Surgery

## 2021-01-04 ENCOUNTER — Ambulatory Visit (HOSPITAL_BASED_OUTPATIENT_CLINIC_OR_DEPARTMENT_OTHER): Payer: Medicare Other | Admitting: Anesthesiology

## 2021-01-04 ENCOUNTER — Encounter (HOSPITAL_BASED_OUTPATIENT_CLINIC_OR_DEPARTMENT_OTHER)
Admission: RE | Disposition: A | Discharge status: Home / Self Care | Payer: Self-pay | Source: Home / Self Care | Attending: General Surgery

## 2021-01-04 ENCOUNTER — Other Ambulatory Visit: Payer: Self-pay

## 2021-01-04 ENCOUNTER — Ambulatory Visit (HOSPITAL_BASED_OUTPATIENT_CLINIC_OR_DEPARTMENT_OTHER)
Admission: RE | Admit: 2021-01-04 | Discharge: 2021-01-04 | Disposition: A | Discharge status: Home / Self Care | Payer: Medicare Other | Attending: General Surgery | Admitting: General Surgery

## 2021-01-04 DIAGNOSIS — Z452 Encounter for adjustment and management of vascular access device: Secondary | ICD-10-CM | POA: Diagnosis not present

## 2021-01-04 DIAGNOSIS — Z800 Family history of malignant neoplasm of digestive organs: Secondary | ICD-10-CM | POA: Insufficient documentation

## 2021-01-04 DIAGNOSIS — Z9221 Personal history of antineoplastic chemotherapy: Secondary | ICD-10-CM | POA: Insufficient documentation

## 2021-01-04 DIAGNOSIS — Z86010 Personal history of colonic polyps: Secondary | ICD-10-CM | POA: Insufficient documentation

## 2021-01-04 DIAGNOSIS — Z803 Family history of malignant neoplasm of breast: Secondary | ICD-10-CM | POA: Insufficient documentation

## 2021-01-04 DIAGNOSIS — Z79899 Other long term (current) drug therapy: Secondary | ICD-10-CM | POA: Insufficient documentation

## 2021-01-04 DIAGNOSIS — Z881 Allergy status to other antibiotic agents status: Secondary | ICD-10-CM | POA: Insufficient documentation

## 2021-01-04 DIAGNOSIS — Z882 Allergy status to sulfonamides status: Secondary | ICD-10-CM | POA: Insufficient documentation

## 2021-01-04 DIAGNOSIS — Z79890 Hormone replacement therapy (postmenopausal): Secondary | ICD-10-CM | POA: Diagnosis not present

## 2021-01-04 DIAGNOSIS — Z90710 Acquired absence of both cervix and uterus: Secondary | ICD-10-CM | POA: Diagnosis not present

## 2021-01-04 DIAGNOSIS — C50412 Malignant neoplasm of upper-outer quadrant of left female breast: Secondary | ICD-10-CM | POA: Diagnosis not present

## 2021-01-04 DIAGNOSIS — Z90721 Acquired absence of ovaries, unilateral: Secondary | ICD-10-CM | POA: Diagnosis not present

## 2021-01-04 DIAGNOSIS — Z171 Estrogen receptor negative status [ER-]: Secondary | ICD-10-CM | POA: Diagnosis not present

## 2021-01-04 DIAGNOSIS — I10 Essential (primary) hypertension: Secondary | ICD-10-CM | POA: Diagnosis not present

## 2021-01-04 DIAGNOSIS — Z853 Personal history of malignant neoplasm of breast: Secondary | ICD-10-CM | POA: Diagnosis not present

## 2021-01-04 DIAGNOSIS — Z8042 Family history of malignant neoplasm of prostate: Secondary | ICD-10-CM | POA: Insufficient documentation

## 2021-01-04 HISTORY — DX: Dyspnea, unspecified: R06.00

## 2021-01-04 HISTORY — PX: PORT-A-CATH REMOVAL: SHX5289

## 2021-01-04 SURGERY — REMOVAL PORT-A-CATH
Anesthesia: Monitor Anesthesia Care | Site: Chest

## 2021-01-04 MED ORDER — PROPOFOL 500 MG/50ML IV EMUL
INTRAVENOUS | Status: DC | PRN
  Administered 2021-01-04: 100 ug/kg/min via INTRAVENOUS

## 2021-01-04 MED ORDER — CHLORHEXIDINE GLUCONATE 0.12 % MT SOLN: 15.0000 mL | Freq: Once | OROMUCOSAL | Status: DC

## 2021-01-04 MED ORDER — BUPIVACAINE-EPINEPHRINE 0.25% -1:200000 IJ SOLN
INTRAMUSCULAR | Status: DC | PRN
  Administered 2021-01-04: 5 mL

## 2021-01-04 MED ORDER — ORAL CARE MOUTH RINSE: 15.0000 mL | Freq: Once | OROMUCOSAL | Status: DC

## 2021-01-04 MED ORDER — CEFAZOLIN SODIUM-DEXTROSE 2-4 GM/100ML-% IV SOLN
INTRAVENOUS | Status: AC
  Filled 2021-01-04: qty 100

## 2021-01-04 MED ORDER — ONDANSETRON HCL 4 MG/2ML IJ SOLN: 4.0000 mg | Freq: Once | INTRAMUSCULAR | Status: DC | PRN

## 2021-01-04 MED ORDER — PROPOFOL 10 MG/ML IV BOLUS
INTRAVENOUS | Status: DC | PRN
  Administered 2021-01-04: 30 mg via INTRAVENOUS

## 2021-01-04 MED ORDER — LIDOCAINE-EPINEPHRINE (PF) 1 %-1:200000 IJ SOLN
INTRAMUSCULAR | Status: DC | PRN
  Administered 2021-01-04: 5 mL

## 2021-01-04 MED ORDER — KETAMINE HCL 50 MG/5ML IJ SOSY
PREFILLED_SYRINGE | INTRAMUSCULAR | Status: AC
  Filled 2021-01-04: qty 5

## 2021-01-04 MED ORDER — LACTATED RINGERS IV SOLN: INTRAVENOUS | Status: DC

## 2021-01-04 MED ORDER — CEFAZOLIN SODIUM-DEXTROSE 2-4 GM/100ML-% IV SOLN
2.0000 g | INTRAVENOUS | Status: AC
  Administered 2021-01-04: 2 g via INTRAVENOUS

## 2021-01-04 MED ORDER — CHLORHEXIDINE GLUCONATE CLOTH 2 % EX PADS: 6.0000 | MEDICATED_PAD | Freq: Once | CUTANEOUS | Status: DC

## 2021-01-04 MED ORDER — OXYCODONE HCL 5 MG PO TABS: 5.0000 mg | ORAL_TABLET | Freq: Once | ORAL | Status: DC | PRN

## 2021-01-04 MED ORDER — FENTANYL CITRATE (PF) 100 MCG/2ML IJ SOLN: 25.0000 ug | INTRAMUSCULAR | Status: DC | PRN

## 2021-01-04 MED ORDER — ACETAMINOPHEN 500 MG PO TABS
ORAL_TABLET | ORAL | Status: AC
  Filled 2021-01-04: qty 2

## 2021-01-04 MED ORDER — KETAMINE HCL 10 MG/ML IJ SOLN
INTRAMUSCULAR | Status: DC | PRN
  Administered 2021-01-04: 40 mg via INTRAVENOUS

## 2021-01-04 MED ORDER — OXYCODONE HCL 5 MG/5ML PO SOLN
5.0000 mg | Freq: Once | ORAL | Status: DC | PRN
Start: 2021-01-04 — End: 2021-01-04

## 2021-01-04 MED ORDER — PROPOFOL 500 MG/50ML IV EMUL
INTRAVENOUS | Status: AC
  Filled 2021-01-04: qty 50

## 2021-01-04 MED ORDER — ACETAMINOPHEN 500 MG PO TABS
1000.0000 mg | ORAL_TABLET | ORAL | Status: AC
  Administered 2021-01-04: 1000 mg via ORAL

## 2021-01-04 SURGICAL SUPPLY — 33 items
ADH SKN CLS APL DERMABOND .7 (GAUZE/BANDAGES/DRESSINGS) ×1
APL PRP STRL LF DISP 70% ISPRP (MISCELLANEOUS) ×1
BLADE HEX COATED 2.75 (ELECTRODE) ×2 IMPLANT
BLADE SURG 15 STRL LF DISP TIS (BLADE) ×1 IMPLANT
BLADE SURG 15 STRL SS (BLADE) ×2
CHLORAPREP W/TINT 26 (MISCELLANEOUS) ×2 IMPLANT
COVER BACK TABLE 60X90IN (DRAPES) ×2 IMPLANT
COVER MAYO STAND STRL (DRAPES) ×2 IMPLANT
COVER WAND RF STERILE (DRAPES) ×2 IMPLANT
DERMABOND ADVANCED (GAUZE/BANDAGES/DRESSINGS) ×1
DERMABOND ADVANCED .7 DNX12 (GAUZE/BANDAGES/DRESSINGS) ×1 IMPLANT
DRAPE LAPAROTOMY TRNSV 102X78 (DRAPES) ×2 IMPLANT
DRAPE UTILITY XL STRL (DRAPES) ×2 IMPLANT
ELECT REM PT RETURN 9FT ADLT (ELECTROSURGICAL) ×2
ELECTRODE REM PT RTRN 9FT ADLT (ELECTROSURGICAL) ×1 IMPLANT
GLOVE SURG ENC MOIS LTX SZ6 (GLOVE) ×2 IMPLANT
GLOVE SURG UNDER LTX SZ6.5 (GLOVE) ×2 IMPLANT
GLOVE SURG UNDER POLY LF SZ7 (GLOVE) ×4 IMPLANT
GOWN STRL REUS W/TWL 2XL LVL3 (GOWN DISPOSABLE) ×2 IMPLANT
GOWN STRL REUS W/TWL LRG LVL3 (GOWN DISPOSABLE) ×2 IMPLANT
KIT TURNOVER CYSTO (KITS) ×2 IMPLANT
NEEDLE HYPO 25X1 1.5 SAFETY (NEEDLE) ×2 IMPLANT
NS IRRIG 500ML POUR BTL (IV SOLUTION) ×2 IMPLANT
PACK BASIN DAY SURGERY FS (CUSTOM PROCEDURE TRAY) ×2 IMPLANT
PENCIL SMOKE EVACUATOR (MISCELLANEOUS) ×2 IMPLANT
SUT MNCRL AB 4-0 PS2 18 (SUTURE) ×2 IMPLANT
SUT VIC AB 3-0 SH 27 (SUTURE) ×2
SUT VIC AB 3-0 SH 27X BRD (SUTURE) ×1 IMPLANT
SUT VIC AB 4-0 PS2 18 (SUTURE) ×2 IMPLANT
SYR CONTROL 10ML LL (SYRINGE) ×2 IMPLANT
TOWEL OR 17X26 10 PK STRL BLUE (TOWEL DISPOSABLE) ×2 IMPLANT
TRAY DSU PREP LF (CUSTOM PROCEDURE TRAY) ×2 IMPLANT
WATER STERILE IRR 500ML POUR (IV SOLUTION) ×2 IMPLANT

## 2021-01-04 NOTE — Anesthesia Preprocedure Evaluation (Addendum)
Anesthesia Evaluation  Patient identified by MRN, date of birth, ID band Patient awake    Reviewed: Allergy & Precautions, NPO status , Patient's Chart, lab work & pertinent test results  History of Anesthesia Complications Negative for: history of anesthetic complications  Airway Mallampati: III  TM Distance: >3 FB Neck ROM: Full    Dental  (+) Dental Advisory Given, Teeth Intact   Pulmonary neg pulmonary ROS,    Pulmonary exam normal        Cardiovascular hypertension, Pt. on medications Normal cardiovascular exam     Neuro/Psych  Headaches, PSYCHIATRIC DISORDERS Anxiety    GI/Hepatic Neg liver ROS, GERD  Controlled and Medicated,  Endo/Other  Hypothyroidism   Renal/GU negative Renal ROS     Musculoskeletal  (+) Arthritis ,   Abdominal   Peds  Hematology negative hematology ROS (+)   Anesthesia Other Findings Covid test negative   Reproductive/Obstetrics  Breast cancer                             Anesthesia Physical Anesthesia Plan  ASA: II  Anesthesia Plan: MAC   Post-op Pain Management:    Induction: Intravenous  PONV Risk Score and Plan: 2 and Propofol infusion and Treatment may vary due to age or medical condition  Airway Management Planned: Natural Airway and Simple Face Mask  Additional Equipment: None  Intra-op Plan:   Post-operative Plan:   Informed Consent: I have reviewed the patients History and Physical, chart, labs and discussed the procedure including the risks, benefits and alternatives for the proposed anesthesia with the patient or authorized representative who has indicated his/her understanding and acceptance.       Plan Discussed with: CRNA and Anesthesiologist  Anesthesia Plan Comments:        Anesthesia Quick Evaluation

## 2021-01-04 NOTE — Anesthesia Postprocedure Evaluation (Signed)
Anesthesia Post Note  Patient: Cindy [name]  Procedure(s) Performed: REMOVAL PORT-A-CATH (Chest)     Patient location during evaluation: PACU Anesthesia Type: MAC Level of consciousness: awake and alert Pain management: pain level controlled Vital Signs Assessment: post-procedure vital signs reviewed and stable Respiratory status: spontaneous breathing, nonlabored ventilation and respiratory function stable Cardiovascular status: stable and blood pressure returned to baseline Anesthetic complications: no   No complications documented.  Last Vitals:  Vitals:   01/04/21 1445 01/04/21 1517  BP:  (!) 99/56  Pulse: 80 79  Resp: 15 16  Temp: (!) 36.3 C (!) 36.3 C  SpO2: 98% 97%    Last Pain:  Vitals:   01/04/21 1517  TempSrc:   PainSc: 0-No pain                 Audry Pili

## 2021-01-04 NOTE — Op Note (Signed)
  PRE-OPERATIVE DIAGNOSIS:  un-needed Port-A-Cath for left breast cancer  POST-OPERATIVE DIAGNOSIS:  Same   PROCEDURE:  Procedure(s):  REMOVAL PORT-A-CATH  SURGEON:  Surgeon(s):  Stark Klein, MD  ANESTHESIA:   MAC + local  EBL:   Minimal  SPECIMEN:  None  Complications : none known  Procedure:   Pt was  identified in the holding area and taken to the operating room where she was placed supine on the operating room table.  MAC anesthesia was induced.  The right upper chest was prepped and draped.  The prior incision was anesthetized with local anesthetic.  The incision was opened with a #15 blade.  The subcutaneous tissue was divided with the cautery.  The port was identified and the capsule opened.  The four 2-0 prolene sutures were removed.  The port was then removed and pressure held on the tract.  The catheter appeared intact without evidence of breakage, length was 13 cm.  The wound was inspected for hemostasis, which was achieved with cautery.  The wound was closed with 3-0 vicryl deep dermal interrupted sutures and 4-0 Monocryl running subcuticular suture.  The wound was cleaned, dried, and dressed with dermabond.  The patient was awakened from anesthesia and taken to the PACU in stable condition.  Needle, sponge, and instrument counts are correct.

## 2021-01-04 NOTE — H&P (Signed)
Cindy [name] is an 75 y.o. female.   Chief Complaint: port in place HPI: Pt is a 75 yo F s/p breast conservation for left sided breast cancer.  She required chemotherapy and that is now complete.  She has no evidence of disease.    Past Medical History:  Diagnosis Date  . Abnormal mammogram   . Allergic rhinitis 10/30/2018  . Anxiety in acute stress reaction 10/30/2018  . Arthritis   . Atypical chest pain 10/30/2018  . Cancer [facility])    breast   . Chest pain    "not cardiac related"  . Colon polyps   . Dyspnea    DOE  . Family history of breast cancer 07/05/2020  . Family history of pancreatic cancer 07/05/2020  . Family history of prostate cancer 07/05/2020  . GERD (gastroesophageal reflux disease) 10/30/2018  . History of kidney stones    passed  . Hypertension 10/30/2018  . Hyperthyroidism   . Hypothyroidism 10/30/2018  . Migraine 10/30/2018  . Osteoporosis 10/30/2018  . Pneumonia   . 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  . Vitamin D deficiency 10/30/2018    Past Surgical History:  Procedure Laterality Date  . BREAST LUMPECTOMY WITH RADIOACTIVE SEED AND SENTINEL LYMPH NODE BIOPSY Left 08/15/2020   Procedure: LEFT BREAST LUMPECTOMY WITH RADIOACTIVE SEED AND SENTINEL LYMPH NODE BIOPSY;  Surgeon: Stark Klein, MD;  Location: [facility];  Service: General;  Laterality: Left;  RNFA  . COLONOSCOPY W/ POLYPECTOMY    . ESOPHAGEAL DILATION    . EYE SURGERY Bilateral    catheter with lens  . HYSTERECTOMY ABDOMINAL WITH SALPINGECTOMY    . PORTACATH PLACEMENT Right 08/15/2020   Procedure: INSERTION PORT-A-CATH;  Surgeon: Stark Klein, MD;  Location: [place];  Service: General;  Laterality: Right;  . TONSILLECTOMY    . TUBAL LIGATION      Family History  Problem Relation Age of Onset  . High blood pressure Mother   . Breast cancer Mother        dx before 56  . Alzheimer's disease Mother   . Heart attack Father   . Prostate cancer Brother 15  . Breast cancer Maternal Aunt         dx 45s  . Breast cancer Maternal Aunt        dx 25s  . Breast cancer Maternal Grandmother        dx after 50  . Leukemia Paternal Grandfather        dx 71s-80s  . Pancreatic cancer Cousin        maternal; dx 61s  . Cancer Maternal Aunt 50       unknown type  . Breast cancer Cousin        paternal; dx 15s   Social History:  reports that she has never smoked. She has never used smokeless tobacco. She reports that she does not drink alcohol and does not use drugs.  Allergies:  Allergies  Allergen Reactions  . Sulfamethoxazole-Trimethoprim Rash  . Tetracyclines & Related Rash    Medications Prior to Admission  Medication Sig Dispense Refill  . acetaminophen (TYLENOL) 500 MG tablet Take 1,000 mg by mouth every 6 (six) hours as needed for moderate pain.    . Calcium-Vitamin D-Vitamin K 500-100-40 MG-UNT-MCG CHEW Chew 2 tablets by mouth daily.    . celecoxib (CELEBREX) 200 MG capsule One to 2 tablets by mouth daily as needed for pain. (Patient taking differently: Take 400 mg by mouth daily.) 180 capsule 3  .  cetirizine (ZYRTEC) 10 MG tablet Take 10 mg by mouth daily as needed for allergies.    Marland Kitchen escitalopram (LEXAPRO) 5 MG tablet Take 5 mg by mouth daily.    Marland Kitchen esomeprazole (NEXIUM) 40 MG capsule Take 1 capsule by mouth once daily 90 capsule 0  . hydrochlorothiazide (HYDRODIURIL) 25 MG tablet TAKE 1 TABLET BY MOUTH ONCE DAILY AS NEEDED SWELLING 90 tablet 0  . levothyroxine (SYNTHROID) 75 MCG tablet Take 1 tablet (75 mcg total) by mouth daily before breakfast. Take on Days 1-6 and skip Day 7- NEED ANNUAL EXAM AND LABS (Patient taking differently: Take 75 mcg by mouth daily before breakfast.) 90 tablet 2  . Multiple Vitamins-Minerals (THRIVE FOR LIFE WOMENS PO) Take 1 tablet by mouth every morning. Also Thrive patch and Thrive milkshake daily    . NIFEdipine (PROCARDIA-XL/NIFEDICAL-XL) 30 MG 24 hr tablet Take 1 tablet by mouth once daily (Patient taking differently: Take 30 mg by mouth  daily.) 90 tablet 2  . Probiotic Product (PROBIOTIC PO) Take 1 capsule by mouth daily.    . rosuvastatin (CRESTOR) 20 MG tablet Take 1 tablet (20 mg total) by mouth daily. 90 tablet 3  . vitamin B-12 (CYANOCOBALAMIN) 500 MCG tablet Take 500 mcg by mouth daily.     . cycloSPORINE (RESTASIS) 0.05 % ophthalmic emulsion Place 1 drop into both eyes 2 (two) times daily as needed (dry eyes).    Marland Kitchen denosumab (PROLIA) 60 MG/ML SOSY injection Inject 60 mg into the skin every 6 (six) months.    . diclofenac sodium (VOLTAREN) 1 % GEL Apply 4 g topically 4 (four) times daily. (Patient taking differently: Apply 2 g topically 4 (four) times daily as needed (pain).) 100 g 11  . lidocaine-prilocaine (EMLA) cream Apply to affected area once (Patient not taking: Reported on 12/27/2020) 30 g 3  . montelukast (SINGULAIR) 10 MG tablet Take 1 tablet (10 mg total) by mouth at bedtime. (Patient taking differently: Take 10 mg by mouth at bedtime as needed (allergies).) 90 tablet 1  . nitroGLYCERIN (NITROSTAT) 0.4 MG SL tablet Place 1 tablet (0.4 mg total) under the tongue every 5 (five) minutes as needed for chest pain. 20 tablet 1  . ondansetron (ZOFRAN) 8 MG tablet Take 1 tablet (8 mg total) by mouth 2 (two) times daily as needed for refractory nausea / vomiting. Start on day 3 after chemotherapy. 30 tablet 1  . prochlorperazine (COMPAZINE) 10 MG tablet Take 1 tablet (10 mg total) by mouth every 6 (six) hours as needed (Nausea or vomiting). 30 tablet 1  . SUMAtriptan (IMITREX) 100 MG tablet Take 0.5-1 tablets (50-100 mg total) by mouth every 2 (two) hours as needed for migraine (Max 2 pills per 24 hours). 10 tablet 3    No results found for this or any previous visit (from the past 48 hour(s)). No results found.  Review of Systems  All other systems reviewed and are negative.   Blood pressure 110/74, pulse (!) 101, temperature (!) 97 F (36.1 C), temperature source Oral, resp. rate 17, height 5\' 4"  (1.626 m), weight  66.4 kg, SpO2 96 %. Physical Exam Constitutional:      General: She is not in acute distress.    Appearance: Normal appearance.  HENT:     Head: Normocephalic.  Eyes:     Pupils: Pupils are equal, round, and reactive to light.  Cardiovascular:     Rate and Rhythm: Normal rate and regular rhythm.  Pulmonary:     Effort: Pulmonary  effort is normal.     Comments: Port in place on the right upper chest Abdominal:     General: Abdomen is flat.  Musculoskeletal:     Cervical back: Neck supple.  Skin:    General: Skin is warm and dry.     Capillary Refill: Capillary refill takes 2 to 3 seconds.  Neurological:     General: No focal deficit present.     Mental Status: She is alert.  Psychiatric:        Mood and Affect: Mood normal.        Behavior: Behavior normal.        Thought Content: Thought content normal.        Judgment: Judgment normal.      Assessment/Plan Port in place H/o left breast cancer, s/p chemotherapy.  Plan port removal. Discussed procedure and risks. Pt agrees to proceed.  Stark Klein, MD 01/04/2021, 1:21 PM

## 2021-01-04 NOTE — Discharge Instructions (Addendum)
[facility] Office Phone Number [phone]   POST OP INSTRUCTIONS  Always review your discharge instruction sheet given to you by the facility where your surgery was performed.  IF YOU HAVE DISABILITY OR FAMILY LEAVE FORMS, YOU MUST BRING THEM TO THE OFFICE FOR PROCESSING.  DO NOT GIVE THEM TO YOUR DOCTOR.  1. A prescription for pain medication may be given to you upon discharge.  Take your pain medication as prescribed, if needed.  If narcotic pain medicine is not needed, then you may take acetaminophen (Tylenol) or ibuprofen (Advil) as needed. 2. Take your usually prescribed medications unless otherwise directed 3. If you need a refill on your pain medication, please contact your pharmacy.  They will contact our office to request authorization.  Prescriptions will not be filled after 5pm or on week-ends. 4. You should eat very light the first 24 hours after surgery, such as soup, crackers, pudding, etc.  Resume your normal diet the day after surgery 5. It is common to experience some constipation if taking pain medication after surgery.  Increasing fluid intake and taking a stool softener will usually help or prevent this problem from occurring.  A mild laxative (Milk of Magnesia or Miralax) should be taken according to package directions if there are no bowel movements after 48 hours. 6. You may shower in 48 hours.  The surgical glue will flake off in 2-3 weeks.   7. ACTIVITIES:  No strenuous activity or heavy lifting for 1 week.   a. You may drive when you no longer are taking prescription pain medication, you can comfortably wear a seatbelt, and you can safely maneuver your car and apply brakes. b. RETURN TO WORK:  __________n/a_______________ You should see your doctor in the office for a follow-up appointment approximately three-four weeks after your surgery.    WHEN TO CALL YOUR DOCTOR: 1. Fever over 101.0 2. Nausea and/or vomiting. 3. Extreme swelling or  bruising. 4. Continued bleeding from incision. 5. Increased pain, redness, or drainage from the incision.  The clinic staff is available to answer your questions during regular business hours.  Please don't hesitate to call and ask to speak to one of the nurses for clinical concerns.  If you have a medical emergency, go to the nearest emergency room or call 911.  A surgeon from [facility] is always on call at the hospital.  For further questions, please visit [email or web address]     Post Anesthesia Home Care Instructions  Activity: Get plenty of rest for the remainder of the day. A responsible individual must stay with you for 24 hours following the procedure.  For the next 24 hours, DO NOT: -Drive a car -Operate machinery -Drink alcoholic beverages -Take any medication unless instructed by your physician -Make any legal decisions or sign important papers.  Meals: Start with liquid foods such as gelatin or soup. Progress to regular foods as tolerated. Avoid greasy, spicy, heavy foods. If nausea and/or vomiting occur, drink only clear liquids until the nausea and/or vomiting subsides. Call your physician if vomiting continues.  Special Instructions/Symptoms: Your throat may feel dry or sore from the anesthesia or the breathing tube placed in your throat during surgery. If this causes discomfort, gargle with warm salt water. The discomfort should disappear within 24 hours.  If you had a scopolamine patch placed behind your ear for the management of post- operative nausea and/or vomiting:  1. The medication in the patch is effective for 72 hours, after which it should   be removed.  Wrap patch in a tissue and discard in the trash. Wash hands thoroughly with soap and water. 2. You may remove the patch earlier than 72 hours if you experience unpleasant side effects which may include dry mouth, dizziness or visual disturbances. 3. Avoid touching the patch. Wash your hands  with soap and water after contact with the patch.      

## 2021-01-04 NOTE — Transfer of Care (Signed)
Immediate Anesthesia Transfer of Care Note  Patient: Cindy [name]  Procedure(s) Performed: Procedure(s): REMOVAL PORT-A-CATH  Patient Location: PACU  Anesthesia Type: MAC  Level of Consciousness: awake, alert , oriented and patient cooperative  Airway & Oxygen Therapy: Patient Spontanous Breathing on room air  Post-op Assessment: Report given to PACU RN and Post -op Vital signs reviewed and stable  Post vital signs: Reviewed and stable  Complications: No apparent anesthesia complications Last Vitals:  Vitals Value Taken Time  BP 107/74 01/04/21 1408  Temp    Pulse 91 01/04/21 1413  Resp 14 01/04/21 1413  SpO2 96 % 01/04/21 1413  Vitals shown include unvalidated device data.  Last Pain:  Vitals:   01/04/21 1047  TempSrc: Oral  PainSc: 0-No pain      Patients Stated Pain Goal: 3 (55/83/16 7425)  Complications: No complications documented.

## 2021-01-05 ENCOUNTER — Encounter (HOSPITAL_BASED_OUTPATIENT_CLINIC_OR_DEPARTMENT_OTHER): Payer: Self-pay | Admitting: General Surgery

## 2021-01-09 NOTE — Progress Notes (Signed)
Location of Breast Cancer: Malignant neoplasm of upper-outer quadrant of LEFT breast, estrogen receptor negative  Histology per Pathology Report:  08/15/2020 FINAL MICROSCOPIC DIAGNOSIS:  A. 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  - High grade ductal carcinoma in situ with necrosis.  - Invasive carcinoma is present at the anterior margin focally.  - In situ carcinoma is <1 mm from the anterior margin focally.  - Biopsy site.  - See oncology table.  B. SENTINEL LYMPH NODE, LEFT AXILLARY #1, BIOPSY:  - One of one lymph nodes negative for carcinoma (0/1).  C. SENTINEL LYMPH NODE, LEFT AXILLARY #2, BIOPSY:  - One of one lymph nodes negative for carcinoma (0/1).   Receptor Status: ER(Negative), PR (Negative), Her2-neu (Negative), Ki-67(30%)  Did patient present with symptoms (if so, please note symptoms) or was this found on screening mammography?:  Screening mammogram on 05/10/20 showed a possible left breast mass. Diagnostic mammogram on 06/05/20 showed two adjacent masses at the 1 o'clock position in the left breast, 0.7cm and 0.8cm, about 0.5cm apart, and spanning in total 1.8cm, no left axillary adenopathy  Past/Anticipated interventions by surgeon, if any: 08/15/2020 Dr. Stark Klein --Left Breast Radioactive seed localized lumpectomy and sentinel lymph node biopsy --Port placement  Past/Anticipated interventions by medical oncology, if any:  Under care of Dr. Nicholas Lose 12/19/2020 --Treatment plan: 1.adjuvant chemotherapywith CMF x6 cyclesplan to start treatment 09/05/2020 2.Follow-up adjuvant radiation --Current treatment: Cycle6CMF  Labs have been reviewed Chemotoxicities: 1.Mild nausea: Improved with Compazine 2.shortness of breath to exertion:Most likely it is from fatigue related to chemotherapy. 3.Thinning of hair 4.ANC today 2.7: Okay to treat. Neulasta ink 5. Alternating constipation and  diarrhea --Today's chemo will conclude her treatment.  She will then move onto radiation.  --RTC after XRT is complete  Lymphedema issues, if any:  Reports she goes to PT for an assessment about every 3 months. States she was told at her last session that she had minimal swelling to her left arm  Pain issues, if any:  Patient denies   SAFETY ISSUES:  Prior radiation? No  Pacemaker/ICD? No  Possible current pregnancy? No--hysterectomy  Is the patient on methotrexate? No--final cycle of CMF was 12/19/2020  Current Complaints / other details:  Patient has received the first 2 doses of the Pfizer vaccine

## 2021-01-10 ENCOUNTER — Ambulatory Visit
Admission: RE | Admit: 2021-01-10 | Discharge: 2021-01-10 | Disposition: A | Discharge status: Home / Self Care | Payer: Medicare Other | Source: Ambulatory Visit | Attending: Radiation Oncology | Admitting: Radiation Oncology

## 2021-01-10 ENCOUNTER — Other Ambulatory Visit: Payer: Self-pay

## 2021-01-10 ENCOUNTER — Encounter: Payer: Self-pay | Admitting: Radiation Oncology

## 2021-01-10 VITALS — BP 110/75 | HR 99 | Temp 97.8°F | Resp 20 | Ht 64.7 in | Wt 146.6 lb

## 2021-01-10 DIAGNOSIS — C50412 Malignant neoplasm of upper-outer quadrant of left female breast: Secondary | ICD-10-CM | POA: Diagnosis not present

## 2021-01-10 DIAGNOSIS — Z171 Estrogen receptor negative status [ER-]: Secondary | ICD-10-CM | POA: Diagnosis not present

## 2021-01-10 DIAGNOSIS — Z79899 Other long term (current) drug therapy: Secondary | ICD-10-CM | POA: Diagnosis not present

## 2021-01-10 NOTE — Progress Notes (Signed)
Radiation Oncology         [phone] ________________________________  Name: Cindy [name] MRN: [id number]  Date: 01/10/2021  DOB: 30-Dec-1945  Follow-Up Visit Note  Outpatient  CC: Emeterio Reeve, DO  Nicholas Lose, MD  Diagnosis:      ICD-10-CM   1. Malignant neoplasm of upper-outer quadrant of left breast in female, estrogen receptor negative ([place])  C50.412    Z17.1     Cancer Staging Malignant neoplasm of upper-outer quadrant of left breast in female, estrogen receptor negative ([place]) Staging form: Breast, AJCC 8th Edition - Clinical stage from 07/05/2020: Stage IB (cT1c, cN0, cM0, G3, ER-, PR-, HER2-) - Signed by Nicholas Lose, MD on 07/05/2020 Stage prefix: Initial diagnosis Histologic grading system: 3 grade system  pT1c, pN0  CHIEF COMPLAINT: Here to discuss management of left breast cancer  Narrative:  The patient returns today for follow-up. She was seen in consultation on 07/05/2020.   Since consultation date, she has not undergone any significant imaging studies.  Breast/nodal surgery on the date of 08/15/2020 revealed: tumor size of 1.2 cm; histology of metaplastic carcinoma with high-grade DCIS and necrosis; margin status to invasive disease of positive for focal involvement at the anterior margin; margin status to in situ disease of positive at < 39m from the anterior margin focally; nodal status of negative (0/2); ER status: negative; PR status: negative; Her2 status: negative; Grade: 3.  Anterior margin was at skin per my discussion with Dr. BBarry Dienes  Systemic therapy, if applicable, involved (dates and therapy as follows): Six cycles of adjuvant chemotherapy with CMF beginning on 09/05/2020.  Symptomatically, the patient reports: mild residual fatigue, hair thinning  Lymphedema issues, if any:  Reports she goes to PT for an assessment about every 3 months. States she was told at her last session that she had minimal swelling to her left arm  Pain  issues, if any:  Patient denies   SAFETY ISSUES:  Prior radiation? No  Pacemaker/ICD? No  Possible current pregnancy? No--hysterectomy  Is the patient on methotrexate? No--final cycle of CMF was 12/19/2020  She is s/p two COVID shots, due for booster shot - she does not intend to get booster.        ALLERGIES:  is allergic to sulfamethoxazole-trimethoprim and tetracyclines & related.  Meds: Current Outpatient Medications  Medication Sig Dispense Refill  . acetaminophen (TYLENOL) 500 MG tablet Take 1,000 mg by mouth every 6 (six) hours as needed for moderate pain.    . Calcium-Vitamin D-Vitamin K 500-100-40 MG-UNT-MCG CHEW Chew 2 tablets by mouth daily.    . celecoxib (CELEBREX) 200 MG capsule One to 2 tablets by mouth daily as needed for pain. (Patient taking differently: Take 400 mg by mouth daily.) 180 capsule 3  . cetirizine (ZYRTEC) 10 MG tablet Take 10 mg by mouth daily as needed for allergies.    . cycloSPORINE (RESTASIS) 0.05 % ophthalmic emulsion Place 1 drop into both eyes 2 (two) times daily as needed (dry eyes).    .Marland Kitchendenosumab (PROLIA) 60 MG/ML SOSY injection Inject 60 mg into the skin every 6 (six) months.    . diclofenac sodium (VOLTAREN) 1 % GEL Apply 4 g topically 4 (four) times daily. (Patient taking differently: Apply 2 g topically 4 (four) times daily as needed (pain).) 100 g 11  . escitalopram (LEXAPRO) 5 MG tablet Take 5 mg by mouth daily.    .Marland Kitchenesomeprazole (NEXIUM) 40 MG capsule Take 1 capsule by mouth once daily 90 capsule 0  .  hydrochlorothiazide (HYDRODIURIL) 25 MG tablet TAKE 1 TABLET BY MOUTH ONCE DAILY AS NEEDED SWELLING 90 tablet 0  . levothyroxine (SYNTHROID) 75 MCG tablet Take 1 tablet (75 mcg total) by mouth daily before breakfast. Take on Days 1-6 and skip Day 7- NEED ANNUAL EXAM AND LABS (Patient taking differently: Take 75 mcg by mouth daily before breakfast.) 90 tablet 2  . montelukast (SINGULAIR) 10 MG tablet Take 1 tablet (10 mg total) by mouth at  bedtime. (Patient taking differently: Take 10 mg by mouth at bedtime as needed (allergies).) 90 tablet 1  . Multiple Vitamins-Minerals (THRIVE FOR LIFE WOMENS PO) Take 1 tablet by mouth every morning. Also Thrive patch and Thrive milkshake daily    . NIFEdipine (PROCARDIA-XL/NIFEDICAL-XL) 30 MG 24 hr tablet Take 1 tablet by mouth once daily (Patient taking differently: Take 30 mg by mouth daily.) 90 tablet 2  . nitroGLYCERIN (NITROSTAT) 0.4 MG SL tablet Place 1 tablet (0.4 mg total) under the tongue every 5 (five) minutes as needed for chest pain. 20 tablet 1  . Probiotic Product (PROBIOTIC PO) Take 1 capsule by mouth daily.    . rosuvastatin (CRESTOR) 20 MG tablet Take 1 tablet (20 mg total) by mouth daily. 90 tablet 3  . SUMAtriptan (IMITREX) 100 MG tablet Take 0.5-1 tablets (50-100 mg total) by mouth every 2 (two) hours as needed for migraine (Max 2 pills per 24 hours). 10 tablet 3  . vitamin B-12 (CYANOCOBALAMIN) 500 MCG tablet Take 500 mcg by mouth daily.      No current facility-administered medications for this encounter.    Physical Findings:  height is 5' 4.7" (1.643 m) and weight is 146 lb 9.6 oz (66.5 kg). Her temperature is 97.8 F (36.6 C). Her blood pressure is 110/75 and her pulse is 99. Her respiration is 20 and oxygen saturation is 98%. .     General: Alert and oriented, in no acute distress   Breast exam deferred today  Lab Findings: Lab Results  Component Value Date   WBC 4.4 12/19/2020   HGB 12.1 12/19/2020   HCT 36.2 12/19/2020   MCV 95.8 12/19/2020   PLT 195 12/19/2020      Radiographic Findings: No results found.  Impression/Plan: Left breast cancer  We discussed adjuvant radiotherapy today.  I recommend radiation therapy to the left breast with a boost to the lumpectomy cavity over 21 treatments total in order to reduce risk of local regional recurrence.  I reviewed the logistics, benefits, risks, and potential side effects of this treatment in detail.  Risks may include but not necessary be limited to acute and late injury tissue in the radiation fields such as skin irritation (change in color/pigmentation, itching, dryness, pain, peeling). She may experience fatigue. We also discussed possible risk of long term cosmetic changes or scar tissue. There is also a smaller risk for lung toxicity, cardiac toxicity, brachial plexopathy, lymphedema, musculoskeletal changes, rib fragility or induction of a second malignancy, late chronic non-healing soft tissue wound.    The patient asked good questions which I answered to her satisfaction. She is enthusiastic about proceeding with treatment. A consent form has been signed and placed in her chart.  We will proceed with CT simulation on Friday, May 6 and start treatment in about a week.  We discussed measures to reduce the risk of infection during the COVID-19 pandemic.  I highly recommended that she receive the booster shot.  She tolerated her first 2 shots without any complications.  However, she  does not intend on getting the booster shot due to personal reservations.  I encouraged her to let us know if she changes her mind as we offer vaccinations here.   On date of service, in total, I spent 30 minutes on this encounter. Patient was seen in person.  _____________________________________   Eppie Gibson, MD  This document serves as a record of services personally performed by Eppie Gibson, MD. It was created on his behalf by Clerance Lav, a trained medical scribe. The creation of this record is based on the scribe's personal observations and the provider's statements to them. This document has been checked and approved by the attending provider.

## 2021-01-12 ENCOUNTER — Ambulatory Visit
Admission: RE | Admit: 2021-01-12 | Discharge: 2021-01-12 | Disposition: A | Discharge status: Home / Self Care | Payer: Medicare Other | Source: Ambulatory Visit | Attending: Radiation Oncology | Admitting: Radiation Oncology

## 2021-01-12 ENCOUNTER — Other Ambulatory Visit: Payer: Self-pay

## 2021-01-12 DIAGNOSIS — C50412 Malignant neoplasm of upper-outer quadrant of left female breast: Secondary | ICD-10-CM | POA: Diagnosis not present

## 2021-01-12 DIAGNOSIS — Z510 Encounter for antineoplastic radiation therapy: Secondary | ICD-10-CM | POA: Diagnosis not present

## 2021-01-12 DIAGNOSIS — Z171 Estrogen receptor negative status [ER-]: Secondary | ICD-10-CM | POA: Insufficient documentation

## 2021-01-16 DIAGNOSIS — Z171 Estrogen receptor negative status [ER-]: Secondary | ICD-10-CM | POA: Diagnosis not present

## 2021-01-16 DIAGNOSIS — Z510 Encounter for antineoplastic radiation therapy: Secondary | ICD-10-CM | POA: Diagnosis not present

## 2021-01-16 DIAGNOSIS — C50412 Malignant neoplasm of upper-outer quadrant of left female breast: Secondary | ICD-10-CM | POA: Diagnosis not present

## 2021-01-17 ENCOUNTER — Encounter: Payer: Self-pay | Admitting: *Deleted

## 2021-01-18 ENCOUNTER — Other Ambulatory Visit: Payer: Self-pay

## 2021-01-18 ENCOUNTER — Ambulatory Visit
Admission: RE | Admit: 2021-01-18 | Discharge: 2021-01-18 | Disposition: A | Discharge status: Home / Self Care | Payer: Medicare Other | Source: Ambulatory Visit | Attending: Radiation Oncology | Admitting: Radiation Oncology

## 2021-01-18 ENCOUNTER — Telehealth: Payer: Self-pay | Admitting: Hematology and Oncology

## 2021-01-18 DIAGNOSIS — C50412 Malignant neoplasm of upper-outer quadrant of left female breast: Secondary | ICD-10-CM | POA: Diagnosis not present

## 2021-01-18 DIAGNOSIS — Z510 Encounter for antineoplastic radiation therapy: Secondary | ICD-10-CM | POA: Diagnosis not present

## 2021-01-18 DIAGNOSIS — Z171 Estrogen receptor negative status [ER-]: Secondary | ICD-10-CM | POA: Diagnosis not present

## 2021-01-18 NOTE — Telephone Encounter (Signed)
Scheduled appt per 5/11 sch msg. Pt aware.  

## 2021-01-19 ENCOUNTER — Ambulatory Visit
Admission: RE | Admit: 2021-01-19 | Discharge: 2021-01-19 | Disposition: A | Discharge status: Home / Self Care | Payer: Medicare Other | Source: Ambulatory Visit | Attending: Radiation Oncology | Admitting: Radiation Oncology

## 2021-01-19 DIAGNOSIS — Z171 Estrogen receptor negative status [ER-]: Secondary | ICD-10-CM | POA: Diagnosis not present

## 2021-01-19 DIAGNOSIS — Z510 Encounter for antineoplastic radiation therapy: Secondary | ICD-10-CM | POA: Diagnosis not present

## 2021-01-19 DIAGNOSIS — C50412 Malignant neoplasm of upper-outer quadrant of left female breast: Secondary | ICD-10-CM | POA: Diagnosis not present

## 2021-01-22 ENCOUNTER — Ambulatory Visit
Admission: RE | Admit: 2021-01-22 | Discharge: 2021-01-22 | Disposition: A | Discharge status: Home / Self Care | Payer: Medicare Other | Source: Ambulatory Visit | Attending: Radiation Oncology | Admitting: Radiation Oncology

## 2021-01-22 DIAGNOSIS — C50412 Malignant neoplasm of upper-outer quadrant of left female breast: Secondary | ICD-10-CM

## 2021-01-22 DIAGNOSIS — Z171 Estrogen receptor negative status [ER-]: Secondary | ICD-10-CM | POA: Diagnosis not present

## 2021-01-22 DIAGNOSIS — Z510 Encounter for antineoplastic radiation therapy: Secondary | ICD-10-CM | POA: Diagnosis not present

## 2021-01-22 MED ORDER — ALRA NON-METALLIC DEODORANT (RAD-ONC)
1.0000 "application " | Freq: Once | TOPICAL | Status: AC
  Administered 2021-01-22: 1 via TOPICAL

## 2021-01-22 MED ORDER — RADIAPLEXRX EX GEL
Freq: Once | CUTANEOUS | Status: AC
Start: 2021-01-22 — End: 2021-01-22

## 2021-01-22 NOTE — Progress Notes (Signed)
Pt here for patient teaching.  Completed by Phineas Real Silva-RN  Pt given Radiation and You booklet, skin care instructions, Alra deodorant and Radiaplex gel.    Reviewed areas of pertinence such as fatigue, hair loss, skin changes, breast tenderness and breast swelling .   Pt able to give teach back of to pat skin, use unscented/gentle soap and drink plenty of water,apply Radiaplex bid, avoid applying anything to skin within 4 hours of treatment, avoid wearing an under wire bra and to use an electric razor if they must shave.   Pt demonstrated understanding and verbalizes understanding of information given and will contact nursing with any questions or concerns.    [email or web address]

## 2021-01-23 ENCOUNTER — Ambulatory Visit
Admission: RE | Admit: 2021-01-23 | Discharge: 2021-01-23 | Disposition: A | Discharge status: Home / Self Care | Payer: Medicare Other | Source: Ambulatory Visit | Attending: Radiation Oncology | Admitting: Radiation Oncology

## 2021-01-23 ENCOUNTER — Other Ambulatory Visit: Payer: Self-pay

## 2021-01-23 DIAGNOSIS — Z171 Estrogen receptor negative status [ER-]: Secondary | ICD-10-CM | POA: Diagnosis not present

## 2021-01-23 DIAGNOSIS — C50412 Malignant neoplasm of upper-outer quadrant of left female breast: Secondary | ICD-10-CM | POA: Diagnosis not present

## 2021-01-23 DIAGNOSIS — Z510 Encounter for antineoplastic radiation therapy: Secondary | ICD-10-CM | POA: Diagnosis not present

## 2021-01-24 ENCOUNTER — Ambulatory Visit
Admission: RE | Admit: 2021-01-24 | Discharge: 2021-01-24 | Disposition: A | Discharge status: Home / Self Care | Payer: Medicare Other | Source: Ambulatory Visit | Attending: Radiation Oncology | Admitting: Radiation Oncology

## 2021-01-24 DIAGNOSIS — Z171 Estrogen receptor negative status [ER-]: Secondary | ICD-10-CM | POA: Diagnosis not present

## 2021-01-24 DIAGNOSIS — C50412 Malignant neoplasm of upper-outer quadrant of left female breast: Secondary | ICD-10-CM | POA: Diagnosis not present

## 2021-01-24 DIAGNOSIS — Z510 Encounter for antineoplastic radiation therapy: Secondary | ICD-10-CM | POA: Diagnosis not present

## 2021-01-25 ENCOUNTER — Other Ambulatory Visit: Payer: Self-pay

## 2021-01-25 ENCOUNTER — Ambulatory Visit
Admission: RE | Admit: 2021-01-25 | Discharge: 2021-01-25 | Disposition: A | Discharge status: Home / Self Care | Payer: Medicare Other | Source: Ambulatory Visit | Attending: Radiation Oncology | Admitting: Radiation Oncology

## 2021-01-25 DIAGNOSIS — Z171 Estrogen receptor negative status [ER-]: Secondary | ICD-10-CM | POA: Diagnosis not present

## 2021-01-25 DIAGNOSIS — Z510 Encounter for antineoplastic radiation therapy: Secondary | ICD-10-CM | POA: Diagnosis not present

## 2021-01-25 DIAGNOSIS — C50412 Malignant neoplasm of upper-outer quadrant of left female breast: Secondary | ICD-10-CM | POA: Diagnosis not present

## 2021-01-26 ENCOUNTER — Ambulatory Visit
Admission: RE | Admit: 2021-01-26 | Discharge: 2021-01-26 | Disposition: A | Discharge status: Home / Self Care | Payer: Medicare Other | Source: Ambulatory Visit | Attending: Radiation Oncology | Admitting: Radiation Oncology

## 2021-01-26 DIAGNOSIS — Z171 Estrogen receptor negative status [ER-]: Secondary | ICD-10-CM | POA: Diagnosis not present

## 2021-01-26 DIAGNOSIS — C50412 Malignant neoplasm of upper-outer quadrant of left female breast: Secondary | ICD-10-CM | POA: Diagnosis not present

## 2021-01-26 DIAGNOSIS — Z510 Encounter for antineoplastic radiation therapy: Secondary | ICD-10-CM | POA: Diagnosis not present

## 2021-01-29 ENCOUNTER — Ambulatory Visit: Payer: Medicare Other | Admitting: Radiation Oncology

## 2021-01-29 ENCOUNTER — Ambulatory Visit
Admission: RE | Admit: 2021-01-29 | Discharge: 2021-01-29 | Disposition: A | Discharge status: Home / Self Care | Payer: Medicare Other | Source: Ambulatory Visit | Attending: Radiation Oncology | Admitting: Radiation Oncology

## 2021-01-29 ENCOUNTER — Other Ambulatory Visit: Payer: Self-pay | Admitting: Radiation Oncology

## 2021-01-29 ENCOUNTER — Other Ambulatory Visit: Payer: Self-pay

## 2021-01-29 DIAGNOSIS — C50412 Malignant neoplasm of upper-outer quadrant of left female breast: Secondary | ICD-10-CM

## 2021-01-29 DIAGNOSIS — Z171 Estrogen receptor negative status [ER-]: Secondary | ICD-10-CM | POA: Diagnosis not present

## 2021-01-29 DIAGNOSIS — Z510 Encounter for antineoplastic radiation therapy: Secondary | ICD-10-CM | POA: Diagnosis not present

## 2021-01-30 ENCOUNTER — Ambulatory Visit
Admission: RE | Admit: 2021-01-30 | Discharge: 2021-01-30 | Disposition: A | Discharge status: Home / Self Care | Payer: Medicare Other | Source: Ambulatory Visit | Attending: Radiation Oncology | Admitting: Radiation Oncology

## 2021-01-30 DIAGNOSIS — C50412 Malignant neoplasm of upper-outer quadrant of left female breast: Secondary | ICD-10-CM | POA: Diagnosis not present

## 2021-01-30 DIAGNOSIS — Z171 Estrogen receptor negative status [ER-]: Secondary | ICD-10-CM | POA: Diagnosis not present

## 2021-01-30 DIAGNOSIS — Z510 Encounter for antineoplastic radiation therapy: Secondary | ICD-10-CM | POA: Diagnosis not present

## 2021-01-31 ENCOUNTER — Other Ambulatory Visit: Payer: Self-pay

## 2021-01-31 ENCOUNTER — Ambulatory Visit
Admission: RE | Admit: 2021-01-31 | Discharge: 2021-01-31 | Disposition: A | Discharge status: Home / Self Care | Payer: Medicare Other | Source: Ambulatory Visit | Attending: Radiation Oncology | Admitting: Radiation Oncology

## 2021-01-31 DIAGNOSIS — Z171 Estrogen receptor negative status [ER-]: Secondary | ICD-10-CM | POA: Diagnosis not present

## 2021-01-31 DIAGNOSIS — C50412 Malignant neoplasm of upper-outer quadrant of left female breast: Secondary | ICD-10-CM | POA: Diagnosis not present

## 2021-01-31 DIAGNOSIS — Z510 Encounter for antineoplastic radiation therapy: Secondary | ICD-10-CM | POA: Diagnosis not present

## 2021-02-01 ENCOUNTER — Ambulatory Visit
Admission: RE | Admit: 2021-02-01 | Discharge: 2021-02-01 | Disposition: A | Discharge status: Home / Self Care | Payer: Medicare Other | Source: Ambulatory Visit | Attending: Radiation Oncology | Admitting: Radiation Oncology

## 2021-02-01 DIAGNOSIS — Z171 Estrogen receptor negative status [ER-]: Secondary | ICD-10-CM | POA: Diagnosis not present

## 2021-02-01 DIAGNOSIS — C50412 Malignant neoplasm of upper-outer quadrant of left female breast: Secondary | ICD-10-CM | POA: Diagnosis not present

## 2021-02-01 DIAGNOSIS — Z510 Encounter for antineoplastic radiation therapy: Secondary | ICD-10-CM | POA: Diagnosis not present

## 2021-02-02 ENCOUNTER — Ambulatory Visit
Admission: RE | Admit: 2021-02-02 | Discharge: 2021-02-02 | Disposition: A | Discharge status: Home / Self Care | Payer: Medicare Other | Source: Ambulatory Visit | Attending: Radiation Oncology | Admitting: Radiation Oncology

## 2021-02-02 DIAGNOSIS — Z171 Estrogen receptor negative status [ER-]: Secondary | ICD-10-CM | POA: Diagnosis not present

## 2021-02-02 DIAGNOSIS — Z510 Encounter for antineoplastic radiation therapy: Secondary | ICD-10-CM | POA: Diagnosis not present

## 2021-02-02 DIAGNOSIS — C50412 Malignant neoplasm of upper-outer quadrant of left female breast: Secondary | ICD-10-CM | POA: Diagnosis not present

## 2021-02-06 ENCOUNTER — Ambulatory Visit
Admission: RE | Admit: 2021-02-06 | Discharge: 2021-02-06 | Disposition: A | Discharge status: Home / Self Care | Payer: Medicare Other | Source: Ambulatory Visit | Attending: Radiation Oncology | Admitting: Radiation Oncology

## 2021-02-06 ENCOUNTER — Ambulatory Visit: Payer: Medicare Other | Admitting: Radiation Oncology

## 2021-02-06 ENCOUNTER — Other Ambulatory Visit: Payer: Self-pay

## 2021-02-06 DIAGNOSIS — Z510 Encounter for antineoplastic radiation therapy: Secondary | ICD-10-CM | POA: Diagnosis not present

## 2021-02-06 DIAGNOSIS — Z171 Estrogen receptor negative status [ER-]: Secondary | ICD-10-CM | POA: Diagnosis not present

## 2021-02-06 DIAGNOSIS — C50412 Malignant neoplasm of upper-outer quadrant of left female breast: Secondary | ICD-10-CM | POA: Diagnosis not present

## 2021-02-07 ENCOUNTER — Ambulatory Visit
Admission: RE | Admit: 2021-02-07 | Discharge: 2021-02-07 | Disposition: A | Discharge status: Home / Self Care | Payer: Medicare Other | Source: Ambulatory Visit | Attending: Radiation Oncology | Admitting: Radiation Oncology

## 2021-02-07 DIAGNOSIS — Z171 Estrogen receptor negative status [ER-]: Secondary | ICD-10-CM | POA: Insufficient documentation

## 2021-02-07 DIAGNOSIS — Z510 Encounter for antineoplastic radiation therapy: Secondary | ICD-10-CM | POA: Insufficient documentation

## 2021-02-07 DIAGNOSIS — C50412 Malignant neoplasm of upper-outer quadrant of left female breast: Secondary | ICD-10-CM | POA: Diagnosis not present

## 2021-02-07 DIAGNOSIS — R293 Abnormal posture: Secondary | ICD-10-CM | POA: Diagnosis not present

## 2021-02-07 DIAGNOSIS — I890 Lymphedema, not elsewhere classified: Secondary | ICD-10-CM | POA: Diagnosis not present

## 2021-02-07 DIAGNOSIS — Z483 Aftercare following surgery for neoplasm: Secondary | ICD-10-CM | POA: Diagnosis not present

## 2021-02-08 ENCOUNTER — Other Ambulatory Visit: Payer: Self-pay

## 2021-02-08 ENCOUNTER — Telehealth: Payer: Self-pay | Admitting: Cardiology

## 2021-02-08 ENCOUNTER — Ambulatory Visit
Admission: RE | Admit: 2021-02-08 | Discharge: 2021-02-08 | Disposition: A | Discharge status: Home / Self Care | Payer: Medicare Other | Source: Ambulatory Visit | Attending: Radiation Oncology | Admitting: Radiation Oncology

## 2021-02-08 ENCOUNTER — Telehealth: Payer: Self-pay

## 2021-02-08 DIAGNOSIS — Z171 Estrogen receptor negative status [ER-]: Secondary | ICD-10-CM | POA: Diagnosis not present

## 2021-02-08 DIAGNOSIS — R293 Abnormal posture: Secondary | ICD-10-CM | POA: Diagnosis not present

## 2021-02-08 DIAGNOSIS — Z510 Encounter for antineoplastic radiation therapy: Secondary | ICD-10-CM | POA: Diagnosis not present

## 2021-02-08 DIAGNOSIS — Z483 Aftercare following surgery for neoplasm: Secondary | ICD-10-CM | POA: Diagnosis not present

## 2021-02-08 DIAGNOSIS — I890 Lymphedema, not elsewhere classified: Secondary | ICD-10-CM | POA: Diagnosis not present

## 2021-02-08 DIAGNOSIS — C50412 Malignant neoplasm of upper-outer quadrant of left female breast: Secondary | ICD-10-CM | POA: Diagnosis not present

## 2021-02-08 NOTE — Telephone Encounter (Signed)
Received direct call into triage regarding patient with complaint of chest pain. Katelyn from [facility] called to report that patient states she has taken Nitroglycerin 2 days in a row for chest pain. Spoke with patient who states that she had sharp pain in chest yesterday in which she took 2 nitro 5 mins apart and pain was relieved. Patient states that pain returned this morning and she took 2 nitro again with relief of symptoms. Patient denies having any dizziness, shortness of breath, or anything with the chest pain. Patient states that she is not currently having any symptoms at present. Offered patient an appointment today to be seen today but patient unable to make that appointment due to scheduling conflicts. Made patient an appointment with Almyra Deforest PA-C for 6/10 to address issues. Advised patient that if pain returned she should report to the ED for evaluation. Patient is agreeable to this plan. Advised patient that I would forward to make Dr. Stanford Breed aware. Patient verbalized understanding.

## 2021-02-08 NOTE — Telephone Encounter (Addendum)
Notified by therapists on L3 that patient informed them she had taken 2 sublingual tablets of nitroglycerin last night 6/1 as well as another 2 tablets this morning 6/2 before coming for her radiation treatment. Contacted patient's cardiologist's office to see if patient could be seen today by any provider in the practice, or if she should be directed to the ED. Patient herself spoke with triage nurse at office using [facility] portable phone. Patient informed nurse that her symptoms were intense pain to her chest and right side of her face, but that she has experienced these before and had a myriad of testing that could not determine a cause. Patient was not interested in being seen by a cardiologist provider today because of other appointments and her grandchildren's graduation, but she did agree to F/U appointment on 02/16/21. Patient knows to present to ED should symptoms return or new ones arise. She denied any other needs at this time and ambulated out of clinic unassisted without incident

## 2021-02-08 NOTE — Telephone Encounter (Signed)
Caitlyn, RN at [facility], called regarding patient. Patient took 2 nitroGLYCERIN (NITROSTAT) 0.4 MG SL tablet during night time and 2 nitroGLYCERIN (NITROSTAT) 0.4 MG SL tablet this morning. Wants to know if patient could be seen today

## 2021-02-09 ENCOUNTER — Ambulatory Visit
Admission: RE | Admit: 2021-02-09 | Discharge: 2021-02-09 | Disposition: A | Discharge status: Home / Self Care | Payer: Medicare Other | Source: Ambulatory Visit | Attending: Radiation Oncology | Admitting: Radiation Oncology

## 2021-02-09 DIAGNOSIS — R293 Abnormal posture: Secondary | ICD-10-CM | POA: Diagnosis not present

## 2021-02-09 DIAGNOSIS — Z483 Aftercare following surgery for neoplasm: Secondary | ICD-10-CM | POA: Diagnosis not present

## 2021-02-09 DIAGNOSIS — Z171 Estrogen receptor negative status [ER-]: Secondary | ICD-10-CM | POA: Diagnosis not present

## 2021-02-09 DIAGNOSIS — Z510 Encounter for antineoplastic radiation therapy: Secondary | ICD-10-CM | POA: Diagnosis not present

## 2021-02-09 DIAGNOSIS — I890 Lymphedema, not elsewhere classified: Secondary | ICD-10-CM | POA: Diagnosis not present

## 2021-02-09 DIAGNOSIS — C50412 Malignant neoplasm of upper-outer quadrant of left female breast: Secondary | ICD-10-CM | POA: Diagnosis not present

## 2021-02-11 NOTE — Assessment & Plan Note (Addendum)
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

## 2021-02-11 NOTE — Progress Notes (Signed)
Patient Care Team: Emeterio Reeve, DO as PCP - General (Osteopathic Medicine) Mauro Kaufmann, RN as Oncology Nurse Navigator Rockwell Germany, RN as Oncology Nurse Navigator Stark Klein, MD as Consulting Physician (General Surgery) Nicholas Lose, MD as Consulting Physician (Hematology and Oncology) Eppie Gibson, MD as Attending Physician (Radiation Oncology)  DIAGNOSIS:    ICD-10-CM   1. Malignant neoplasm of upper-outer quadrant of left breast in female, estrogen receptor negative ([place])  C50.412    Z17.1     SUMMARY OF ONCOLOGIC HISTORY: Oncology History  Malignant neoplasm of upper-outer quadrant of left breast in female, estrogen receptor negative ([place])  06/30/2020 Initial Diagnosis   Screening mammogram showed a possible left breast mass. Diagnostic mammogram showed two adjacent masses at the 1 o'clock position, 0.7cm and 0.8cm, about 0.5cm apart, and spanning in total 1.8cm, no left axillary adenopathy. Biopsy showed IDC with squamous differentiation and DCIS, grade 3, HER-2 negative (1+), ER/PR negative, Ki67 80%.    07/05/2020 Cancer Staging   Staging form: Breast, AJCC 8th Edition - Clinical stage from 07/05/2020: Stage IB (cT1c, cN0, cM0, G3, ER-, PR-, HER2-) - Signed by Nicholas Lose, MD on 07/05/2020   07/11/2020 Genetic Testing   Negative genetic testing: no mutations detected in [facility] Common Hereditary Cancers Panel.  The report date is July 11, 2020.   The Common Hereditary Cancers Panel offered by [facility] includes sequencing and/or deletion duplication testing of the following 48 genes: APC, ATM, AXIN2, BARD1, BMPR1A, BRCA1, BRCA2, BRIP1, CDH1, CDK4, CDKN2A (p14ARF), CDKN2A (p16INK4a), CHEK2, CTNNA1, DICER1, EPCAM (Deletion/duplication testing only), GREM1 (promoter region deletion/duplication testing only), KIT, MEN1, MLH1, MSH2, MSH3, MSH6, MUTYH, NBN, NF1, NHTL1, PALB2, PDGFRA, PMS2, POLD1, POLE, PTEN, RAD50, RAD51C, RAD51D, RNF43, SDHB, SDHC, SDHD, SMAD4,  SMARCA4. STK11, TP53, TSC1, TSC2, and VHL.  The following genes were evaluated for sequence changes only: SDHA and HOXB13 c.251G>A variant only.   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   09/05/2020 - 12/21/2020 Chemotherapy           CHIEF COMPLIANT: Follow-up of left breast cancer   INTERVAL HISTORY: Cindy [name] is a 75 y.o. with above-mentioned history of left breastcancer whounderwent a left lumpectomy, adjuvant chemotherapy, and si currently on radiation.She presents to the clinic today to discuss further treatment.   ALLERGIES:  is allergic to sulfamethoxazole-trimethoprim and tetracyclines & related.  MEDICATIONS:  Current Outpatient Medications  Medication Sig Dispense Refill  . acetaminophen (TYLENOL) 500 MG tablet Take 1,000 mg by mouth every 6 (six) hours as needed for moderate pain.    . Calcium-Vitamin D-Vitamin K 500-100-40 MG-UNT-MCG CHEW Chew 2 tablets by mouth daily.    . celecoxib (CELEBREX) 200 MG capsule One to 2 tablets by mouth daily as needed for pain. (Patient taking differently: Take 400 mg by mouth daily.) 180 capsule 3  . cetirizine (ZYRTEC) 10 MG tablet Take 10 mg by mouth daily as needed for allergies.    . cycloSPORINE (RESTASIS) 0.05 % ophthalmic emulsion Place 1 drop into both eyes 2 (two) times daily as needed (dry eyes).    Marland Kitchen denosumab (PROLIA) 60 MG/ML SOSY injection Inject 60 mg into the skin every 6 (six) months.    . diclofenac sodium (VOLTAREN) 1 % GEL Apply 4 g topically 4 (four) times daily. (Patient taking differently: Apply 2 g topically 4 (four) times daily as needed (pain).) 100 g  11  . escitalopram (LEXAPRO) 5 MG tablet Take 5 mg by mouth daily.    Marland Kitchen esomeprazole (NEXIUM) 40 MG capsule Take 1 capsule by mouth once daily 90 capsule 0  . hydrochlorothiazide (HYDRODIURIL) 25 MG tablet TAKE  1 TABLET BY MOUTH ONCE DAILY AS NEEDED SWELLING 90 tablet 0  . levothyroxine (SYNTHROID) 75 MCG tablet Take 1 tablet (75 mcg total) by mouth daily before breakfast. Take on Days 1-6 and skip Day 7- NEED ANNUAL EXAM AND LABS (Patient taking differently: Take 75 mcg by mouth daily before breakfast.) 90 tablet 2  . montelukast (SINGULAIR) 10 MG tablet Take 1 tablet (10 mg total) by mouth at bedtime. (Patient taking differently: Take 10 mg by mouth at bedtime as needed (allergies).) 90 tablet 1  . Multiple Vitamins-Minerals (THRIVE FOR LIFE WOMENS PO) Take 1 tablet by mouth every morning. Also Thrive patch and Thrive milkshake daily    . NIFEdipine (PROCARDIA-XL/NIFEDICAL-XL) 30 MG 24 hr tablet Take 1 tablet by mouth once daily (Patient taking differently: Take 30 mg by mouth daily.) 90 tablet 2  . nitroGLYCERIN (NITROSTAT) 0.4 MG SL tablet Place 1 tablet (0.4 mg total) under the tongue every 5 (five) minutes as needed for chest pain. 20 tablet 1  . Probiotic Product (PROBIOTIC PO) Take 1 capsule by mouth daily.    . rosuvastatin (CRESTOR) 20 MG tablet Take 1 tablet (20 mg total) by mouth daily. 90 tablet 3  . SUMAtriptan (IMITREX) 100 MG tablet Take 0.5-1 tablets (50-100 mg total) by mouth every 2 (two) hours as needed for migraine (Max 2 pills per 24 hours). 10 tablet 3  . vitamin B-12 (CYANOCOBALAMIN) 500 MCG tablet Take 500 mcg by mouth daily.      No current facility-administered medications for this visit.    PHYSICAL EXAMINATION: ECOG PERFORMANCE STATUS: 1 - Symptomatic but completely ambulatory  Vitals:   02/12/21 1118  BP: 119/75  Pulse: 70  Resp: 16  Temp: 98.2 F (36.8 C)  SpO2: 99%   Filed Weights   02/12/21 1118  Weight: 150 lb 1.6 oz (68.1 kg)    LABORATORY DATA:  I have reviewed the data as listed CMP Latest Ref Rng & Units 12/19/2020 11/28/2020 11/07/2020  Glucose 70 - 99 mg/dL 103(H) 140(H) 94  BUN 8 - 23 mg/dL 23 12 14   Creatinine 0.44 - 1.00 mg/dL 0.93 0.94 0.93   Sodium 135 - 145 mmol/L 142 141 142  Potassium 3.5 - 5.1 mmol/L 4.3 4.1 4.7  Chloride 98 - 111 mmol/L 107 109 109  CO2 22 - 32 mmol/L 25 23 24   Calcium 8.9 - 10.3 mg/dL 8.9 8.6(L) 8.3(L)  Total Protein 6.5 - 8.1 g/dL 6.2(L) 6.4(L) 6.2(L)  Total Bilirubin 0.3 - 1.2 mg/dL 0.6 0.5 0.5  Alkaline Phos 38 - 126 U/L 76 75 70  AST 15 - 41 U/L 20 24 23   ALT 0 - 44 U/L 11 16 17     Lab Results  Component Value Date   WBC 4.4 12/19/2020   HGB 12.1 12/19/2020   HCT 36.2 12/19/2020   MCV 95.8 12/19/2020   PLT 195 12/19/2020   NEUTROABS 2.7 12/19/2020    ASSESSMENT & PLAN:  Malignant neoplasm of upper-outer quadrant of left breast in female, estrogen receptor negative ([place]) 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 We will set her up for every 26-monthProlia injections.  I will see her back in 1 year.  No orders of the defined types were placed in this encounter.  The patient has a good understanding of the overall plan. she agrees with it. she will call with any problems that may develop before the next visit here.  Total time spent: 20 mins including face to face time and time spent for planning, charting and coordination of care  VRulon Eisenmenger MD, MPH 02/12/2021  I, MCloyde ReamsDorshimer, am acting as scribe for Dr. VNicholas Lose  I have reviewed the above documentation for accuracy and completeness, and I agree with the above.

## 2021-02-12 ENCOUNTER — Ambulatory Visit
Admission: RE | Admit: 2021-02-12 | Discharge: 2021-02-12 | Disposition: A | Discharge status: Home / Self Care | Payer: Medicare Other | Source: Ambulatory Visit | Attending: Radiation Oncology | Admitting: Radiation Oncology

## 2021-02-12 ENCOUNTER — Inpatient Hospital Stay: Payer: Medicare Other

## 2021-02-12 ENCOUNTER — Other Ambulatory Visit: Payer: Self-pay

## 2021-02-12 ENCOUNTER — Inpatient Hospital Stay (HOSPITAL_BASED_OUTPATIENT_CLINIC_OR_DEPARTMENT_OTHER): Payer: Medicare Other | Admitting: Hematology and Oncology

## 2021-02-12 VITALS — BP 119/75 | HR 70 | Temp 98.2°F | Resp 16

## 2021-02-12 DIAGNOSIS — Z95828 Presence of other vascular implants and grafts: Secondary | ICD-10-CM

## 2021-02-12 DIAGNOSIS — I890 Lymphedema, not elsewhere classified: Secondary | ICD-10-CM | POA: Diagnosis not present

## 2021-02-12 DIAGNOSIS — M810 Age-related osteoporosis without current pathological fracture: Secondary | ICD-10-CM | POA: Insufficient documentation

## 2021-02-12 DIAGNOSIS — Z483 Aftercare following surgery for neoplasm: Secondary | ICD-10-CM | POA: Diagnosis not present

## 2021-02-12 DIAGNOSIS — Z171 Estrogen receptor negative status [ER-]: Secondary | ICD-10-CM | POA: Diagnosis not present

## 2021-02-12 DIAGNOSIS — R293 Abnormal posture: Secondary | ICD-10-CM | POA: Diagnosis not present

## 2021-02-12 DIAGNOSIS — C50412 Malignant neoplasm of upper-outer quadrant of left female breast: Secondary | ICD-10-CM | POA: Diagnosis not present

## 2021-02-12 DIAGNOSIS — Z510 Encounter for antineoplastic radiation therapy: Secondary | ICD-10-CM | POA: Diagnosis not present

## 2021-02-12 MED ORDER — DENOSUMAB 60 MG/ML ~~LOC~~ SOSY
60.0000 mg | PREFILLED_SYRINGE | Freq: Once | SUBCUTANEOUS | Status: AC
  Administered 2021-02-12: 60 mg via SUBCUTANEOUS

## 2021-02-12 MED ORDER — DENOSUMAB 60 MG/ML ~~LOC~~ SOSY
PREFILLED_SYRINGE | SUBCUTANEOUS | Status: AC
  Filled 2021-02-12: qty 1

## 2021-02-12 NOTE — Patient Instructions (Signed)
Denosumab injection What is this medicine? DENOSUMAB (den oh sue mab) slows bone breakdown. Prolia is used to treat osteoporosis in women after menopause and in men, and in people who are taking corticosteroids for 6 months or more. Xgeva is used to treat a high calcium level due to cancer and to prevent bone fractures and other bone problems caused by multiple myeloma or cancer bone metastases. Xgeva is also used to treat giant cell tumor of the bone. This medicine may be used for other purposes; ask your health care provider or pharmacist if you have questions. COMMON BRAND NAME(S): Prolia, XGEVA What should I tell my health care provider before I take this medicine? They need to know if you have any of these conditions:  dental disease  having surgery or tooth extraction  infection  kidney disease  low levels of calcium or Vitamin D in the blood  malnutrition  on hemodialysis  skin conditions or sensitivity  thyroid or parathyroid disease  an unusual reaction to denosumab, other medicines, foods, dyes, or preservatives  pregnant or trying to get pregnant  breast-feeding How should I use this medicine? This medicine is for injection under the skin. It is given by a health care professional in a hospital or clinic setting. A special MedGuide will be given to you before each treatment. Be sure to read this information carefully each time. For Prolia, talk to your pediatrician regarding the use of this medicine in children. Special care may be needed. For Xgeva, talk to your pediatrician regarding the use of this medicine in children. While this drug may be prescribed for children as young as 13 years for selected conditions, precautions do apply. Overdosage: If you think you have taken too much of this medicine contact a poison control center or emergency room at once. NOTE: This medicine is only for you. Do not share this medicine with others. What if I miss a dose? It is  important not to miss your dose. Call your doctor or health care professional if you are unable to keep an appointment. What may interact with this medicine? Do not take this medicine with any of the following medications:  other medicines containing denosumab This medicine may also interact with the following medications:  medicines that lower your chance of fighting infection  steroid medicines like prednisone or cortisone This list may not describe all possible interactions. Give your health care provider a list of all the medicines, herbs, non-prescription drugs, or dietary supplements you use. Also tell them if you smoke, drink alcohol, or use illegal drugs. Some items may interact with your medicine. What should I watch for while using this medicine? Visit your doctor or health care professional for regular checks on your progress. Your doctor or health care professional may order blood tests and other tests to see how you are doing. Call your doctor or health care professional for advice if you get a fever, chills or sore throat, or other symptoms of a cold or flu. Do not treat yourself. This drug may decrease your body's ability to fight infection. Try to avoid being around people who are sick. You should make sure you get enough calcium and vitamin D while you are taking this medicine, unless your doctor tells you not to. Discuss the foods you eat and the vitamins you take with your health care professional. See your dentist regularly. Brush and floss your teeth as directed. Before you have any dental work done, tell your dentist you are   receiving this medicine. Do not become pregnant while taking this medicine or for 5 months after stopping it. Talk with your doctor or health care professional about your birth control options while taking this medicine. Women should inform their doctor if they wish to become pregnant or think they might be pregnant. There is a potential for serious side  effects to an unborn child. Talk to your health care professional or pharmacist for more information. What side effects may I notice from receiving this medicine? Side effects that you should report to your doctor or health care professional as soon as possible:  allergic reactions like skin rash, itching or hives, swelling of the face, lips, or tongue  bone pain  breathing problems  dizziness  jaw pain, especially after dental work  redness, blistering, peeling of the skin  signs and symptoms of infection like fever or chills; cough; sore throat; pain or trouble passing urine  signs of low calcium like fast heartbeat, muscle cramps or muscle pain; pain, tingling, numbness in the hands or feet; seizures  unusual bleeding or bruising  unusually weak or tired Side effects that usually do not require medical attention (report to your doctor or health care professional if they continue or are bothersome):  constipation  diarrhea  headache  joint pain  loss of appetite  muscle pain  runny nose  tiredness  upset stomach This list may not describe all possible side effects. Call your doctor for medical advice about side effects. You may report side effects to FDA at 1-800-FDA-1088. Where should I keep my medicine? This medicine is only given in a clinic, doctor's office, or other health care setting and will not be stored at home. NOTE: This sheet is a summary. It may not cover all possible information. If you have questions about this medicine, talk to your doctor, pharmacist, or health care provider.  2021 Elsevier/Gold Standard (2018-01-02 16:10:44)

## 2021-02-12 NOTE — Progress Notes (Signed)
Dr. Lindi Adie gave the okay for the patient to receive Prolia today without labs. Consent form was also given to pt to sign about injection.

## 2021-02-12 NOTE — Progress Notes (Signed)
Ok to use labs from 12/2020 for prolia today per Dr Lindi Adie

## 2021-02-13 ENCOUNTER — Ambulatory Visit
Admission: RE | Admit: 2021-02-13 | Discharge: 2021-02-13 | Disposition: A | Discharge status: Home / Self Care | Payer: Medicare Other | Source: Ambulatory Visit | Attending: Radiation Oncology | Admitting: Radiation Oncology

## 2021-02-13 DIAGNOSIS — C50412 Malignant neoplasm of upper-outer quadrant of left female breast: Secondary | ICD-10-CM | POA: Diagnosis not present

## 2021-02-13 DIAGNOSIS — Z483 Aftercare following surgery for neoplasm: Secondary | ICD-10-CM | POA: Diagnosis not present

## 2021-02-13 DIAGNOSIS — Z171 Estrogen receptor negative status [ER-]: Secondary | ICD-10-CM | POA: Diagnosis not present

## 2021-02-13 DIAGNOSIS — R293 Abnormal posture: Secondary | ICD-10-CM | POA: Diagnosis not present

## 2021-02-13 DIAGNOSIS — Z510 Encounter for antineoplastic radiation therapy: Secondary | ICD-10-CM | POA: Diagnosis not present

## 2021-02-13 DIAGNOSIS — I890 Lymphedema, not elsewhere classified: Secondary | ICD-10-CM | POA: Diagnosis not present

## 2021-02-14 ENCOUNTER — Ambulatory Visit: Payer: Medicare Other | Admitting: Physical Therapy

## 2021-02-14 ENCOUNTER — Encounter: Payer: Self-pay | Admitting: Physical Therapy

## 2021-02-14 ENCOUNTER — Other Ambulatory Visit: Payer: Self-pay

## 2021-02-14 ENCOUNTER — Encounter: Payer: Self-pay | Admitting: *Deleted

## 2021-02-14 ENCOUNTER — Ambulatory Visit
Admission: RE | Admit: 2021-02-14 | Discharge: 2021-02-14 | Disposition: A | Discharge status: Home / Self Care | Payer: Medicare Other | Source: Ambulatory Visit | Attending: Radiation Oncology | Admitting: Radiation Oncology

## 2021-02-14 DIAGNOSIS — I890 Lymphedema, not elsewhere classified: Secondary | ICD-10-CM | POA: Diagnosis not present

## 2021-02-14 DIAGNOSIS — R293 Abnormal posture: Secondary | ICD-10-CM | POA: Insufficient documentation

## 2021-02-14 DIAGNOSIS — Z483 Aftercare following surgery for neoplasm: Secondary | ICD-10-CM | POA: Diagnosis not present

## 2021-02-14 DIAGNOSIS — C50412 Malignant neoplasm of upper-outer quadrant of left female breast: Secondary | ICD-10-CM | POA: Insufficient documentation

## 2021-02-14 DIAGNOSIS — Z170 Estrogen receptor positive status [ER+]: Secondary | ICD-10-CM | POA: Insufficient documentation

## 2021-02-14 DIAGNOSIS — Z510 Encounter for antineoplastic radiation therapy: Secondary | ICD-10-CM | POA: Diagnosis not present

## 2021-02-14 DIAGNOSIS — Z171 Estrogen receptor negative status [ER-]: Secondary | ICD-10-CM | POA: Diagnosis not present

## 2021-02-14 NOTE — Therapy (Signed)
[place], [place], [address] Phone: [phone]   Fax:  [phone]  Physical Therapy Evaluation  Patient Details  Name: Cindy [name] MRN: [id number] Date of Birth: Sep 21, 1945 Referring Provider (PT): Reita May Date: 02/14/2021   PT End of Session - 02/14/21 1136    Visit Number 1    Number of Visits 9    Date for PT Re-Evaluation 03/21/21    PT Start Time 1104    PT Stop Time 1127    PT Time Calculation (min) 23 min    Activity Tolerance Patient tolerated treatment well    Behavior During Therapy [facility] for tasks assessed/performed           Past Medical History:  Diagnosis Date  . Abnormal mammogram   . Allergic rhinitis 10/30/2018  . Anxiety in acute stress reaction 10/30/2018  . Arthritis   . Atypical chest pain 10/30/2018  . Cancer [facility])    breast   . Chest pain    "not cardiac related"  . Colon polyps   . Dyspnea    DOE  . Family history of breast cancer 07/05/2020  . Family history of pancreatic cancer 07/05/2020  . Family history of prostate cancer 07/05/2020  . GERD (gastroesophageal reflux disease) 10/30/2018  . History of kidney stones    passed  . Hypertension 10/30/2018  . Hyperthyroidism   . Hypothyroidism 10/30/2018  . Migraine 10/30/2018  . Osteoporosis 10/30/2018  . Pneumonia   . 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  . Vitamin D deficiency 10/30/2018    Past Surgical History:  Procedure Laterality Date  . BREAST LUMPECTOMY WITH RADIOACTIVE SEED AND SENTINEL LYMPH NODE BIOPSY Left 08/15/2020   Procedure: LEFT BREAST LUMPECTOMY WITH RADIOACTIVE SEED AND SENTINEL LYMPH NODE BIOPSY;  Surgeon: Stark Klein, MD;  Location: [place];  Service: General;  Laterality: Left;  RNFA  . COLONOSCOPY W/ POLYPECTOMY    . ESOPHAGEAL DILATION    . EYE SURGERY Bilateral    catheter with lens  . HYSTERECTOMY ABDOMINAL WITH SALPINGECTOMY    . PORT-A-CATH REMOVAL  01/04/2021    Procedure: REMOVAL PORT-A-CATH;  Surgeon: Stark Klein, MD;  Location: [facility];  Service: General;;  . PORTACATH PLACEMENT Right 08/15/2020   Procedure: INSERTION PORT-A-CATH;  Surgeon: Stark Klein, MD;  Location: [place];  Service: General;  Laterality: Right;  . TONSILLECTOMY    . TUBAL LIGATION      There were no vitals filed for this visit.    Subjective Assessment - 02/14/21 1106    Subjective I noticed the swelling in my breast a couple of weeks ago. I will be done with radiation on the 10th. I have a prairie bra and I wear it most of the time.    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 She had left lumpectomy on 08/15/2020 wth 2 negative nodes removed. Ki67 is 80%.    Patient Stated Goals to decrease the swelling    Currently in Pain? Yes    Pain Score 1     Pain Location Breast    Pain Orientation Left    Pain Descriptors / Indicators Discomfort    Pain Type Acute pain    Pain Onset More than a month ago    Pain Frequency Constant    Aggravating Factors  nothing- notices it more when not active    Pain Relieving Factors using the  cream helps the itching    Effect of Pain on Daily Activities no effect              [facility] PT Assessment - 02/14/21 0001      Assessment   Medical Diagnosis L breast cancer    Referring Provider (PT) Isidore Moos    Onset Date/Surgical Date 08/15/20    Hand Dominance Right    Prior Therapy Baselines      Precautions   Precautions Other (comment)    Precaution Comments at risk for lymphedema      Balance Screen   Has the patient fallen in the past 6 months No    Has the patient had a decrease in activity level because of a fear of falling?  No    Is the patient reluctant to leave their home because of a fear of falling?  No      Home Ecologist residence    Living Arrangements Spouse/significant other     Available Help at Discharge Family    Type of [place]      Prior Function   Level of [place] Retired    Leisure Not exercising      Cognition   Overall Cognitive Status Within Functional Limits for tasks assessed      Observation/Other Assessments   Observations L breast is appox 1/3 larger than R breast, L breast is very red from radiation and she has an opening in her skin under her breast that she has covered with a telfa pad      Posture/Postural Control   Posture/Postural Control Postural limitations    Postural Limitations Rounded Shoulders;Forward head;Increased thoracic kyphosis      AROM   Left Shoulder Extension 68 Degrees    Left Shoulder Flexion 150 Degrees    Left Shoulder ABduction 165 Degrees    Left Shoulder Internal Rotation 62 Degrees    Left Shoulder External Rotation 84 Degrees             LYMPHEDEMA/ONCOLOGY QUESTIONNAIRE - 02/14/21 0001      Type   Cancer Type Left breast cancer      Surgeries   Lumpectomy Date 08/15/20    Sentinel Lymph Node Biopsy Date 08/15/20    Number Lymph Nodes Removed 2      Treatment   Active Chemotherapy Treatment Yes    Date 09/05/20    Past Chemotherapy Treatment No    Active Radiation Treatment Yes    Body Site L breast    Past Radiation Treatment No    Current Hormone Treatment No    Past Hormone Therapy No      What other symptoms do you have   Are you Having Heaviness or Tightness Yes    Are you having Pain Yes    Are you having pitting edema Yes    Body Site L breast    Is it Hard or Difficult finding clothes that fit No    Do you have infections No    Is there Decreased scar mobility Yes    [place] Sign No      Lymphedema Assessments   Lymphedema Assessments Upper extremities      Right Upper Extremity Lymphedema   15 cm Proximal to Olecranon Process 26.9 cm    Olecranon Process 23.9 cm    10 cm Proximal to Ulnar Styloid Process 17.6 cm    Just Proximal to  Ulnar Styloid  Process 14.4 cm    Across Hand at [employer] 17.5 cm    At [place] of 2nd Digit 5.9 cm      Left Upper Extremity Lymphedema   15 cm Proximal to Olecranon Process 27.2 cm    Olecranon Process 24 cm    10 cm Proximal to Ulnar Styloid Process 17.8 cm    Just Proximal to Ulnar Styloid Process 14.6 cm    Across Hand at [employer] 16.9 cm    At [place] of 2nd Digit 5.7 cm                   Objective measurements completed on examination: See above findings.                    PT Long Term Goals - 02/14/21 1141      PT LONG TERM GOAL #1   Title Pt will be independent in self MLD for long term management of lymphedema.    Time 5    Period Weeks    Status New    Target Date 03/21/21      PT LONG TERM GOAL #2   Title Pt will report a 50% improvement in L breast swelling to decrease risk of infection.    Time 5    Period Weeks    Status New    Target Date 03/21/21                  Plan - 02/14/21 1137    Clinical Impression Statement Pt reports to PT with L breast swelling that began after she began radiation. She only has two more radiation appointments left before completion. Pt has a history of L breast cancer and underwent a left lumpectomy and SLNB in Dec of 2021. She had a SLNB with 2 nodes removed - each negative. Pt has full ROM of L shoulder - it has improved from baseline. She does have increased thoracic kyphosis with limits ROM at end range. Pt's left breast is a third larger than her right breast and is very red from radiation. She has a [employer] compression bra and has been wearing it most of the time. She reports occasionally she has to wear a different bra due to discomfort of her skin. Unable to begin treatment today due to skin redness but will begin in a week and a half to allow skin to heal. Pt would benefit from skilled PT services to decrease L breast swelling and educate pt on independent management of edema.     Stability/Clinical Decision Making Stable/Uncomplicated    Clinical Decision Making Low    PT Frequency 2x / week    PT Duration --   5 weeks - on hold 1st week due to redness from radiation   PT Treatment/Interventions ADLs/Self Care Home Management;Therapeutic exercise;Patient/family education;Manual lymph drainage;Compression bandaging;Manual techniques;Scar mobilization    PT Next Visit Plan begin MLD to L breast and instruct pt - L-Dex screens every 3 months    Consulted and Agree with Plan of Care Patient           Patient will benefit from skilled therapeutic intervention in order to improve the following deficits and impairments:  Increased edema,Decreased knowledge of precautions,Pain,Postural dysfunction  Visit Diagnosis: Lymphedema, not elsewhere classified  Aftercare following surgery for neoplasm  Abnormal posture  Malignant neoplasm of upper-outer quadrant of left breast in female, estrogen receptor positive ([place])     Problem List  Patient Active Problem List   Diagnosis Date Noted  . Port-A-Cath in place 09/12/2020  . Genetic testing 07/11/2020  . Family history of breast cancer 07/05/2020  . Family history of prostate cancer 07/05/2020  . Family history of pancreatic cancer 07/05/2020  . Malignant neoplasm of upper-outer quadrant of left breast in female, estrogen receptor negative ([place]) 06/30/2020  . 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/23/2019  . Primary osteoarthritis of both hands 06/23/2019  . Trigger thumb of left hand 06/23/2019  . Polyarthralgia 06/23/2019  . Hypertension 10/30/2018  . Atypical chest pain 10/30/2018  . Migraine 10/30/2018  . Hypothyroidism 10/30/2018  . Osteoporosis 10/30/2018  . GERD (gastroesophageal reflux disease) 10/30/2018  . Anxiety in acute stress reaction 10/30/2018  . Allergic rhinitis 10/30/2018  . Vitamin D deficiency 10/30/2018  . 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    Allyson Sabal [facility] 02/14/2021,  11:43 AM  [address] [place], [place], [address] Phone: [phone]   Fax:  [phone]  Name: Cindy [name] MRN: [id number] Date of Birth: 1945-11-17  Manus Gunning, PT 02/14/21 11:43 AM

## 2021-02-15 ENCOUNTER — Ambulatory Visit
Admission: RE | Admit: 2021-02-15 | Discharge: 2021-02-15 | Disposition: A | Discharge status: Home / Self Care | Payer: Medicare Other | Source: Ambulatory Visit | Attending: Radiation Oncology | Admitting: Radiation Oncology

## 2021-02-15 DIAGNOSIS — I890 Lymphedema, not elsewhere classified: Secondary | ICD-10-CM | POA: Diagnosis not present

## 2021-02-15 DIAGNOSIS — Z483 Aftercare following surgery for neoplasm: Secondary | ICD-10-CM | POA: Diagnosis not present

## 2021-02-15 DIAGNOSIS — C50412 Malignant neoplasm of upper-outer quadrant of left female breast: Secondary | ICD-10-CM | POA: Diagnosis not present

## 2021-02-15 DIAGNOSIS — Z171 Estrogen receptor negative status [ER-]: Secondary | ICD-10-CM | POA: Diagnosis not present

## 2021-02-15 DIAGNOSIS — Z510 Encounter for antineoplastic radiation therapy: Secondary | ICD-10-CM | POA: Diagnosis not present

## 2021-02-15 DIAGNOSIS — R293 Abnormal posture: Secondary | ICD-10-CM | POA: Diagnosis not present

## 2021-02-16 ENCOUNTER — Ambulatory Visit: Payer: Medicare Other | Admitting: Physician Assistant

## 2021-02-16 ENCOUNTER — Other Ambulatory Visit: Payer: Self-pay

## 2021-02-16 ENCOUNTER — Ambulatory Visit
Admission: RE | Admit: 2021-02-16 | Discharge: 2021-02-16 | Disposition: A | Discharge status: Home / Self Care | Payer: Medicare Other | Source: Ambulatory Visit | Attending: Radiation Oncology | Admitting: Radiation Oncology

## 2021-02-16 ENCOUNTER — Encounter: Payer: Self-pay | Admitting: Radiation Oncology

## 2021-02-16 DIAGNOSIS — Z171 Estrogen receptor negative status [ER-]: Secondary | ICD-10-CM | POA: Diagnosis not present

## 2021-02-16 DIAGNOSIS — I890 Lymphedema, not elsewhere classified: Secondary | ICD-10-CM | POA: Diagnosis not present

## 2021-02-16 DIAGNOSIS — R293 Abnormal posture: Secondary | ICD-10-CM | POA: Diagnosis not present

## 2021-02-16 DIAGNOSIS — C50412 Malignant neoplasm of upper-outer quadrant of left female breast: Secondary | ICD-10-CM | POA: Diagnosis not present

## 2021-02-16 DIAGNOSIS — Z483 Aftercare following surgery for neoplasm: Secondary | ICD-10-CM | POA: Diagnosis not present

## 2021-02-16 DIAGNOSIS — Z510 Encounter for antineoplastic radiation therapy: Secondary | ICD-10-CM | POA: Diagnosis not present

## 2021-02-28 ENCOUNTER — Other Ambulatory Visit: Payer: Self-pay

## 2021-02-28 ENCOUNTER — Ambulatory Visit: Payer: Medicare Other | Attending: General Surgery | Admitting: Rehabilitation

## 2021-02-28 ENCOUNTER — Telehealth: Payer: Self-pay

## 2021-02-28 DIAGNOSIS — I890 Lymphedema, not elsewhere classified: Secondary | ICD-10-CM | POA: Diagnosis not present

## 2021-02-28 DIAGNOSIS — R293 Abnormal posture: Secondary | ICD-10-CM | POA: Insufficient documentation

## 2021-02-28 DIAGNOSIS — L599 Disorder of the skin and subcutaneous tissue related to radiation, unspecified: Secondary | ICD-10-CM | POA: Insufficient documentation

## 2021-02-28 DIAGNOSIS — Z483 Aftercare following surgery for neoplasm: Secondary | ICD-10-CM | POA: Insufficient documentation

## 2021-02-28 DIAGNOSIS — Z170 Estrogen receptor positive status [ER+]: Secondary | ICD-10-CM | POA: Insufficient documentation

## 2021-02-28 DIAGNOSIS — C50412 Malignant neoplasm of upper-outer quadrant of left female breast: Secondary | ICD-10-CM | POA: Insufficient documentation

## 2021-02-28 MED ORDER — SILVER SULFADIAZINE 1 % EX CREA: 1.0000 "application " | TOPICAL_CREAM | Freq: Two times a day (BID) | CUTANEOUS | 0 refills | Status: DC

## 2021-02-28 NOTE — Therapy (Signed)
[place], [place], [address] Phone: [phone]   Fax:  [phone]  Physical Therapy Treatment  Patient Details  Name: Cindy [name] MRN: [id number] Date of Birth: 1946/06/20 Referring Provider (PT): Reita May Date: 02/28/2021   PT End of Session - 02/28/21 0941     Visit Number 2    Number of Visits 9    Date for PT Re-Evaluation 03/21/21    PT Start Time 0900    PT Stop Time 0940    PT Time Calculation (min) 40 min    Activity Tolerance Patient tolerated treatment well    Behavior During Therapy [facility] for tasks assessed/performed             Past Medical History:  Diagnosis Date   Abnormal mammogram    Allergic rhinitis 10/30/2018   Anxiety in acute stress reaction 10/30/2018   Arthritis    Atypical chest pain 10/30/2018   Cancer [facility])    breast    Chest pain    "not cardiac related"   Colon polyps    Dyspnea    DOE   Family history of breast cancer 07/05/2020   Family history of pancreatic cancer 07/05/2020   Family history of prostate cancer 07/05/2020   GERD (gastroesophageal reflux disease) 10/30/2018   History of kidney stones    passed   Hypertension 10/30/2018   Hyperthyroidism    Hypothyroidism 10/30/2018   Migraine 10/30/2018   Osteoporosis 10/30/2018   Pneumonia    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   Vitamin D deficiency 10/30/2018    Past Surgical History:  Procedure Laterality Date   BREAST LUMPECTOMY WITH RADIOACTIVE SEED AND SENTINEL LYMPH NODE BIOPSY Left 08/15/2020   Procedure: LEFT BREAST LUMPECTOMY WITH RADIOACTIVE SEED AND SENTINEL LYMPH NODE BIOPSY;  Surgeon: Stark Klein, MD;  Location: [place];  Service: General;  Laterality: Left;  RNFA   COLONOSCOPY W/ POLYPECTOMY     ESOPHAGEAL DILATION     EYE SURGERY Bilateral    catheter with lens   HYSTERECTOMY ABDOMINAL WITH SALPINGECTOMY     PORT-A-CATH REMOVAL  01/04/2021   Procedure: REMOVAL  PORT-A-CATH;  Surgeon: Stark Klein, MD;  Location: [place];  Service: General;;   PORTACATH PLACEMENT Right 08/15/2020   Procedure: INSERTION PORT-A-CATH;  Surgeon: Stark Klein, MD;  Location: [place];  Service: General;  Laterality: Right;   TONSILLECTOMY     TUBAL LIGATION      There were no vitals filed for this visit.   Subjective Assessment - 02/28/21 0901     Subjective It is still bad.  Sort of still draining in the armpit.    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 She had left lumpectomy on 08/15/2020 wth 2 negative nodes removed. Ki67 is 80%. Radiation complete    Currently in Pain? Yes    Pain Score 3     Pain Location Axilla    Pain Orientation Left    Pain Descriptors / Indicators Discomfort    Pain Type Acute pain    Pain Onset 1 to 4 weeks ago    Pain Frequency Constant                               [facility] Adult PT Treatment/Exercise - 02/28/21 0001       Manual Therapy   Manual  Therapy Manual Lymphatic Drainage (MLD);Edema management    Edema Management discussed optimal healing care for the axilla; neosporin or silvadene as indicated by Dr. Squire, minimizing friction, and uncovering intermittently.    Manual Lymphatic Drainage (MLD) with pt permission: short neck, superficial abdominals and breathing, Rt axillary and Lt inguinal nodes then interaxillary pathway and left axillo inguinal pathway and then Left breast light pressure with gloves due to open skin in the axilla.  Then in sidelying posterior interaxillary work and lateral breast in area with intact skin only then supine again and repeating all steps                    PT Education - 02/28/21 0940     Education Details MLD, lymphatic anatomy and pathways, skin healing    Person(s) Educated Patient    Methods Explanation    Comprehension Verbalized understanding                  PT Long Term Goals - 02/14/21 1141       PT LONG TERM GOAL #1   Title Pt will be independent in self MLD for long term management of lymphedema.    Time 5    Period Weeks    Status New    Target Date 03/21/21      PT LONG TERM GOAL #2   Title Pt will report a 50% improvement in L breast swelling to decrease risk of infection.    Time 5    Period Weeks    Status New    Target Date 03/21/21                   Plan - 02/28/21 0941     Clinical Impression Statement Pt returns around 1.5 weeks post radiation with red open wet desquamation in the left axilla but tolerance to treatment throughout all other areas so MLD was intiated and tolerated well.  some fibrosis inferior med and lateral breast but not addressed due to skin.    PT Frequency 2x / week    PT Treatment/Interventions ADLs/Self Care Home Management;Therapeutic exercise;Patient/family education;Manual lymph drainage;Compression bandaging;Manual techniques;Scar mobilization    PT Next Visit Plan **DO SOZO week of June 27** cont MLD to L breast and instruct pt - L-Dex screens every 3 months    Consulted and Agree with Plan of Care Patient             Patient will benefit from skilled therapeutic intervention in order to improve the following deficits and impairments:     Visit Diagnosis: Lymphedema, not elsewhere classified  Abnormal posture  Aftercare following surgery for neoplasm  Malignant neoplasm of upper-outer quadrant of left breast in female, estrogen receptor positive ([facility])     Problem List Patient Active Problem List   Diagnosis Date Noted   Port-A-Cath in place 09/12/2020   Genetic testing 07/11/2020   Family history of breast cancer 07/05/2020   Family history of prostate cancer 07/05/2020   Family history of pancreatic cancer 07/05/2020   Malignant neoplasm of upper-outer quadrant of left breast in female, estrogen receptor negative ([facility]) 06/30/2020   Primary  osteoarthritis of both knees 06/23/2019   Primary osteoarthritis of both hands 06/23/2019   Trigger thumb of left hand 06/23/2019   Polyarthralgia 06/23/2019   Hypertension 10/30/2018   Atypical chest pain 10/30/2018   Migraine 10/30/2018   Hypothyroidism 10/30/2018   Osteoporosis 10/30/2018   GERD (gastroesophageal reflux disease) 10/30/2018   Anxiety   in acute stress reaction 10/30/2018   Allergic rhinitis 10/30/2018   Vitamin D deficiency 10/30/2018   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    Stark Bray 02/28/2021, 9:43 AM  [place], [place], [address] Phone: [phone]   Fax:  [phone]  Name: Cindy [name] MRN: [id number] Date of Birth: Jun 24, 1946

## 2021-02-28 NOTE — Telephone Encounter (Signed)
Received call from patient that she was experiencing raw/sore areas under her arm where her skin had peeled deeply. She stated she tried applying Neosporin to the area, but it only made the open areas weep more. She saw her PT today and they mentioned asking Dr. Isidore Moos for Silvadene ointment. Informed her I would ask Dr. Isidore Moos on her behalf, and then update her via [place]. Should Dr. Isidore Moos agree to Silvadene prescription, I advised patient to test a small area on her arm first to see how her skin responded before applying to whole affected area under arm. Also encouraged her to try and leave area open to area as often as possible. Instructed patient to gently remove previous layer of ointment before reapplying second application. Patient verbalized understanding and agreement.   Received verbal orders for Silvadene from Dr. Isidore Moos. Orders placed and MyChart message sent to patient with update. Advised patient to call me back or reply to MyChart message directly should she have any other questions/concerns.

## 2021-03-02 ENCOUNTER — Encounter: Payer: Self-pay | Admitting: Physical Therapy

## 2021-03-02 ENCOUNTER — Ambulatory Visit: Payer: Medicare Other | Admitting: Physical Therapy

## 2021-03-02 ENCOUNTER — Other Ambulatory Visit: Payer: Self-pay

## 2021-03-02 DIAGNOSIS — R293 Abnormal posture: Secondary | ICD-10-CM | POA: Diagnosis not present

## 2021-03-02 DIAGNOSIS — Z483 Aftercare following surgery for neoplasm: Secondary | ICD-10-CM | POA: Diagnosis not present

## 2021-03-02 DIAGNOSIS — C50412 Malignant neoplasm of upper-outer quadrant of left female breast: Secondary | ICD-10-CM | POA: Diagnosis not present

## 2021-03-02 DIAGNOSIS — L599 Disorder of the skin and subcutaneous tissue related to radiation, unspecified: Secondary | ICD-10-CM | POA: Diagnosis not present

## 2021-03-02 DIAGNOSIS — I890 Lymphedema, not elsewhere classified: Secondary | ICD-10-CM

## 2021-03-02 DIAGNOSIS — Z170 Estrogen receptor positive status [ER+]: Secondary | ICD-10-CM | POA: Diagnosis not present

## 2021-03-02 NOTE — Therapy (Signed)
[place], [place], [address] Phone: [phone]   Fax:  [phone]  Physical Therapy Treatment  Patient Details  Name: Cindy [name] MRN: [id number] Date of Birth: 1946/08/06 Referring Provider (PT): Reita May Date: 03/02/2021   PT End of Session - 03/02/21 1155     Visit Number 3    Date for PT Re-Evaluation 03/21/21    PT Start Time 1    PT Stop Time 7858    PT Time Calculation (min) 45 min    Activity Tolerance Patient tolerated treatment well    Behavior During Therapy [facility] for tasks assessed/performed             Past Medical History:  Diagnosis Date   Abnormal mammogram    Allergic rhinitis 10/30/2018   Anxiety in acute stress reaction 10/30/2018   Arthritis    Atypical chest pain 10/30/2018   Cancer [facility])    breast    Chest pain    "not cardiac related"   Colon polyps    Dyspnea    DOE   Family history of breast cancer 07/05/2020   Family history of pancreatic cancer 07/05/2020   Family history of prostate cancer 07/05/2020   GERD (gastroesophageal reflux disease) 10/30/2018   History of kidney stones    passed   Hypertension 10/30/2018   Hyperthyroidism    Hypothyroidism 10/30/2018   Migraine 10/30/2018   Osteoporosis 10/30/2018   Pneumonia    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   Vitamin D deficiency 10/30/2018    Past Surgical History:  Procedure Laterality Date   BREAST LUMPECTOMY WITH RADIOACTIVE SEED AND SENTINEL LYMPH NODE BIOPSY Left 08/15/2020   Procedure: LEFT BREAST LUMPECTOMY WITH RADIOACTIVE SEED AND SENTINEL LYMPH NODE BIOPSY;  Surgeon: Stark Klein, MD;  Location: [place];  Service: General;  Laterality: Left;  RNFA   COLONOSCOPY W/ POLYPECTOMY     ESOPHAGEAL DILATION     EYE SURGERY Bilateral    catheter with lens   HYSTERECTOMY ABDOMINAL WITH SALPINGECTOMY     PORT-A-CATH REMOVAL  01/04/2021   Procedure: REMOVAL PORT-A-CATH;  Surgeon:  Stark Klein, MD;  Location: [place];  Service: General;;   PORTACATH PLACEMENT Right 08/15/2020   Procedure: INSERTION PORT-A-CATH;  Surgeon: Stark Klein, MD;  Location: [place];  Service: General;  Laterality: Right;   TONSILLECTOMY     TUBAL LIGATION      There were no vitals filed for this visit.   Subjective Assessment - 03/02/21 1102     Subjective Pt states she has used the silvadene has really helped.    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 She had left lumpectomy on 08/15/2020 wth 2 negative nodes removed. Ki67 is 80%. Radiation complete    Patient Stated Goals to decrease the swelling    Currently in Pain? No/denies                               [facility] Adult PT Treatment/Exercise - 03/02/21 0001       Manual Therapy   Manual Therapy Manual Lymphatic Drainage (MLD);Edema management    Manual therapy comments began instruction in self MLD : deep breathing, short neck and abdomen, avoing traction on healing skin    Edema Management talked to patient about getting extender for Praire bra so that she would get more  covereage on upper breast into axilla    Manual Lymphatic Drainage (MLD) with pt permission: short neck, superficial abdominals and breathing, Rt axillary and Lt inguinal nodes then interaxillary pathway and left axillo inguinal pathway and then Left breast light pressure  Then in sidelying posterior interaxillary work and lateral breast in area with intact skin only then supine again and repeating all steps                         PT Long Term Goals - 02/14/21 1141       PT LONG TERM GOAL #1   Title Pt will be independent in self MLD for long term management of lymphedema.    Time 5    Period Weeks    Status New    Target Date 03/21/21      PT LONG TERM GOAL #2   Title Pt will report a 50% improvement in L breast  swelling to decrease risk of infection.    Time 5    Period Weeks    Status New    Target Date 03/21/21                   Plan - 03/02/21 1156     Clinical Impression Statement Pt has had good healing of skin, but still has some open areas in axilla. She has adjusted the shoulder hooks of her bra so that she does not get irritation in axilla Talked to pt about considering extender in bra to get better fir.   She does not seem to have much fibrosis in breast today  Began instruction in self MLD    Stability/Clinical Decision Making Stable/Uncomplicated    Rehab Potential Excellent    PT Frequency 2x / week    PT Treatment/Interventions ADLs/Self Care Home Management;Therapeutic exercise;Patient/family education;Manual lymph drainage;Compression bandaging;Manual techniques;Scar mobilization    PT Next Visit Plan **DO SOZO week of June 27** cont MLD to L breast and instruct pt - L-Dex screens every 3 months    PT Home Exercise Plan Post op shoulder ROM HEP and closed chain shoulder flexion             Patient will benefit from skilled therapeutic intervention in order to improve the following deficits and impairments:  Increased edema, Decreased knowledge of precautions, Pain, Postural dysfunction  Visit Diagnosis: Lymphedema, not elsewhere classified  Abnormal posture  Aftercare following surgery for neoplasm  Disorder of the skin and subcutaneous tissue related to radiation, unspecified     Problem List Patient Active Problem List   Diagnosis Date Noted   Port-A-Cath in place 09/12/2020   Genetic testing 07/11/2020   Family history of breast cancer 07/05/2020   Family history of prostate cancer 07/05/2020   Family history of pancreatic cancer 07/05/2020   Malignant neoplasm of upper-outer quadrant of left breast in female, estrogen receptor negative ([place]) 06/30/2020   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/23/2019   Primary osteoarthritis of both hands  06/23/2019   Trigger thumb of left hand 06/23/2019   Polyarthralgia 06/23/2019   Hypertension 10/30/2018   Atypical chest pain 10/30/2018   Migraine 10/30/2018   Hypothyroidism 10/30/2018   Osteoporosis 10/30/2018   GERD (gastroesophageal reflux disease) 10/30/2018   Anxiety in acute stress reaction 10/30/2018   Allergic rhinitis 10/30/2018   Vitamin D deficiency 10/30/2018   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 Owens Shark, PT  [place] 03/02/2021, 11:59 AM    [place], [place], [address] Phone: [phone]   Fax:  [phone]  Name: Cindy [name] MRN: [id number] Date of Birth: February 17, 1946

## 2021-03-02 NOTE — Patient Instructions (Signed)
First of all, check with your insurance company to see if provider is in [place] (for wigs and compression sleeves / gloves/gauntlets )  [place], [place] [address] [phone]  Will file some insurances --- call for appointment   Second to [facility] (for mastectomy prosthetics and garments) [place], [place] [address] [phone] Will file some insurances --- call for appointment  [facility]  [address]  [place], [place] [address] [phone] Lower extremity garments  [facility] [place], [place]  [id number] [facility] (for compression garments, especilly for lower extremities) [address] [place], [place]  [address] [phone] Call for appointment    Cindy [name] ,certified fitter [place]  [phone]  [employer] (for mastectomy supplies and garments) [place] [place], [place] [address] [phone]  Other Resources: National Lymphedema Network:  www.lymphnet.org www.Klosetraining.com for patient articles and self manual lymph drainage information www.lymphedemablog.com has informative articles.  DishTag.es.com www.lymphedemaproducts.com www.brightlifedirect.com

## 2021-03-05 ENCOUNTER — Ambulatory Visit: Payer: Medicare Other

## 2021-03-07 ENCOUNTER — Encounter: Payer: Self-pay | Admitting: Physical Therapy

## 2021-03-07 ENCOUNTER — Ambulatory Visit: Payer: Medicare Other | Admitting: Physical Therapy

## 2021-03-07 ENCOUNTER — Other Ambulatory Visit: Payer: Self-pay

## 2021-03-07 DIAGNOSIS — Z483 Aftercare following surgery for neoplasm: Secondary | ICD-10-CM | POA: Diagnosis not present

## 2021-03-07 DIAGNOSIS — I890 Lymphedema, not elsewhere classified: Secondary | ICD-10-CM

## 2021-03-07 DIAGNOSIS — C50412 Malignant neoplasm of upper-outer quadrant of left female breast: Secondary | ICD-10-CM | POA: Diagnosis not present

## 2021-03-07 DIAGNOSIS — L599 Disorder of the skin and subcutaneous tissue related to radiation, unspecified: Secondary | ICD-10-CM | POA: Diagnosis not present

## 2021-03-07 DIAGNOSIS — Z170 Estrogen receptor positive status [ER+]: Secondary | ICD-10-CM | POA: Diagnosis not present

## 2021-03-07 DIAGNOSIS — R293 Abnormal posture: Secondary | ICD-10-CM | POA: Diagnosis not present

## 2021-03-07 NOTE — Therapy (Signed)
[place], [place], [address] Phone: [phone]   Fax:  [phone]  Physical Therapy Treatment  Patient Details  Name: Cindy [name] MRN: [id number] Date of Birth: Feb 22, 1946 Referring Provider (PT): Reita May Date: 03/07/2021   PT End of Session - 03/07/21 1159     Visit Number 4    Number of Visits 9    Date for PT Re-Evaluation 03/21/21    PT Start Time 1101    PT Stop Time 2119    PT Time Calculation (min) 54 min    Activity Tolerance Patient tolerated treatment well    Behavior During Therapy [facility] for tasks assessed/performed             Past Medical History:  Diagnosis Date   Abnormal mammogram    Allergic rhinitis 10/30/2018   Anxiety in acute stress reaction 10/30/2018   Arthritis    Atypical chest pain 10/30/2018   Cancer [facility])    breast    Chest pain    "not cardiac related"   Colon polyps    Dyspnea    DOE   Family history of breast cancer 07/05/2020   Family history of pancreatic cancer 07/05/2020   Family history of prostate cancer 07/05/2020   GERD (gastroesophageal reflux disease) 10/30/2018   History of kidney stones    passed   Hypertension 10/30/2018   Hyperthyroidism    Hypothyroidism 10/30/2018   Migraine 10/30/2018   Osteoporosis 10/30/2018   Pneumonia    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   Vitamin D deficiency 10/30/2018    Past Surgical History:  Procedure Laterality Date   BREAST LUMPECTOMY WITH RADIOACTIVE SEED AND SENTINEL LYMPH NODE BIOPSY Left 08/15/2020   Procedure: LEFT BREAST LUMPECTOMY WITH RADIOACTIVE SEED AND SENTINEL LYMPH NODE BIOPSY;  Surgeon: Stark Klein, MD;  Location: [place];  Service: General;  Laterality: Left;  RNFA   COLONOSCOPY W/ POLYPECTOMY     ESOPHAGEAL DILATION     EYE SURGERY Bilateral    catheter with lens   HYSTERECTOMY ABDOMINAL WITH SALPINGECTOMY     PORT-A-CATH REMOVAL  01/04/2021   Procedure: REMOVAL  PORT-A-CATH;  Surgeon: Stark Klein, MD;  Location: [place];  Service: General;;   PORTACATH PLACEMENT Right 08/15/2020   Procedure: INSERTION PORT-A-CATH;  Surgeon: Stark Klein, MD;  Location: [place];  Service: General;  Laterality: Right;   TONSILLECTOMY     TUBAL LIGATION      There were no vitals filed for this visit.   Subjective Assessment - 03/07/21 1111     Subjective My breast is doing great. The last time I was here and I had some open wounds. Dr. Isidore Moos sent me silvadene and 3 days later it was almost gone. I am sill having some swelling.    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 She had left lumpectomy on 08/15/2020 wth 2 negative nodes removed. Ki67 is 80%. Radiation complete    Patient Stated Goals to decrease the swelling    Currently in Pain? No/denies    Pain Score 0-No pain                    L-DEX FLOWSHEETS - 03/07/21 1100       L-DEX LYMPHEDEMA SCREENING   Measurement Type Unilateral    L-DEX MEASUREMENT EXTREMITY Upper Extremity    DOMINANT SIDE Right    At Risk  Side Left    BASELINE SCORE (UNILATERAL) 3.9    L-DEX SCORE (UNILATERAL) 2.9    VALUE CHANGE (UNILAT) -1                       [facility] Adult PT Treatment/Exercise - 03/07/21 0001       Manual Therapy   Edema Management pt to pick up bra extender after appt today    Manual Lymphatic Drainage (MLD) with pt permission: short neck, superficial abdominals and breathing, Rt axillary and Lt inguinal nodes then interaxillary pathway and left axillo inguinal pathway and then Left breast then retracing all steps - printed handout and issued to pt and had pt return demonstrate sequence and correct skin stretch technique                         PT Long Term Goals - 02/14/21 1141       PT LONG TERM GOAL #1   Title Pt will be independent in self MLD for long term  management of lymphedema.    Time 5    Period Weeks    Status New    Target Date 03/21/21      PT LONG TERM GOAL #2   Title Pt will report a 50% improvement in L breast swelling to decrease risk of infection.    Time 5    Period Weeks    Status New    Target Date 03/21/21                   Plan - 03/07/21 1200     Clinical Impression Statement Pt plans on picking up extender for her bra after her appointment. She still demonstrates increased L breast swelling with scar tissue noted near area of seroma. Her open wounds have all closed now and her skin is healing. Fibrosis remains improved. Began instructing pt today in self MLD and issued handout. Had pt return demonstrate correct seqeunce and technique.    PT Frequency 2x / week    PT Duration --   5 weeks   PT Treatment/Interventions ADLs/Self Care Home Management;Therapeutic exercise;Patient/family education;Manual lymph drainage;Compression bandaging;Manual techniques;Scar mobilization    PT Next Visit Plan cont MLD to L breast and instruct - assess indep pt - L-Dex screens every 3 months    PT Home Exercise Plan Post op shoulder ROM HEP and closed chain shoulder flexion, self MLD    Consulted and Agree with Plan of Care Patient             Patient will benefit from skilled therapeutic intervention in order to improve the following deficits and impairments:  Increased edema, Decreased knowledge of precautions, Pain, Postural dysfunction  Visit Diagnosis: Lymphedema, not elsewhere classified     Problem List Patient Active Problem List   Diagnosis Date Noted   Port-A-Cath in place 09/12/2020   Genetic testing 07/11/2020   Family history of breast cancer 07/05/2020   Family history of prostate cancer 07/05/2020   Family history of pancreatic cancer 07/05/2020   Malignant neoplasm of upper-outer quadrant of left breast in female, estrogen receptor negative ([place]) 06/30/2020   Primary osteoarthritis of both  knees 06/23/2019   Primary osteoarthritis of both hands 06/23/2019   Trigger thumb of left hand 06/23/2019   Polyarthralgia 06/23/2019   Hypertension 10/30/2018   Atypical chest pain 10/30/2018   Migraine 10/30/2018   Hypothyroidism 10/30/2018   Osteoporosis 10/30/2018   GERD (  gastroesophageal reflux disease) 10/30/2018   Anxiety in acute stress reaction 10/30/2018   Allergic rhinitis 10/30/2018   Vitamin D deficiency 10/30/2018   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

## 2021-03-07 NOTE — Patient Instructions (Signed)
Self manual lymph drainage: Perform this sequence once a day.  Only give enough pressure no your skin to make the skin move.  Diaphragmatic - Supine   Inhale through nose making navel move out toward hands. Exhale through puckered lips, hands follow navel in. Repeat _5__ times. Rest _10__ seconds between repeats.   Copyright  VHI. All rights reserved.  Hug yourself.  Do circles at your neck just above your collarbones.  Repeat this 10 times.  Axilla - One at a Time   Using full weight of flat hand and fingers at center of uninvolved armpit, make _10__ in-place circles.   Copyright  VHI. All rights reserved.  LEG: Inguinal Nodes Stimulation   With small finger side of hand against hip crease on involved side, gently perform circles at the crease. Repeat __10_ times.   Copyright  VHI. All rights reserved.  Axilla to Inguinal Nodes - Sweep   On involved side, sweep _4__ times from armpit along side of trunk to hip crease.  Now gently stretch skin from the involved side to the uninvolved side across the chest at the shoulder line.  Repeat that 4 times.  Draw an imaginary diagonal line from upper outer breast through the nipple area toward lower inner breast.  Direct fluid upward and inward from this line toward the pathway across your upper chest .  Do this in three rows to treat all of the upper inner breast tissue, and do each row 3-4x.      Direct fluid to treat all of lower outer breast tissue downward and outward toward pathway that is aimed at the left groin.  Finish by doing the pathways as described above going from your involved armpit to the same side groin and going across your upper chest from the involved shoulder to the uninvolved shoulder.  Repeat the steps above where you do circles in your left groin and right armpit. Copyright  VHI. All rights reserved.   

## 2021-03-09 ENCOUNTER — Ambulatory Visit: Payer: Medicare Other | Attending: General Surgery | Admitting: Physical Therapy

## 2021-03-09 ENCOUNTER — Other Ambulatory Visit: Payer: Self-pay

## 2021-03-09 DIAGNOSIS — Z483 Aftercare following surgery for neoplasm: Secondary | ICD-10-CM | POA: Diagnosis not present

## 2021-03-09 DIAGNOSIS — I890 Lymphedema, not elsewhere classified: Secondary | ICD-10-CM

## 2021-03-09 DIAGNOSIS — R293 Abnormal posture: Secondary | ICD-10-CM

## 2021-03-09 DIAGNOSIS — Z170 Estrogen receptor positive status [ER+]: Secondary | ICD-10-CM | POA: Diagnosis not present

## 2021-03-09 DIAGNOSIS — L599 Disorder of the skin and subcutaneous tissue related to radiation, unspecified: Secondary | ICD-10-CM | POA: Diagnosis not present

## 2021-03-09 DIAGNOSIS — C50412 Malignant neoplasm of upper-outer quadrant of left female breast: Secondary | ICD-10-CM | POA: Diagnosis not present

## 2021-03-09 NOTE — Therapy (Signed)
[place], [place], [address] Phone: [phone]   Fax:  [phone]  Physical Therapy Treatment  Patient Details  Name: Cindy [name] MRN: [id number] Date of Birth: May 19, 1946 Referring Provider (PT): Reita May Date: 03/09/2021   PT End of Session - 03/09/21 0955     Visit Number 5    Number of Visits 9    Date for PT Re-Evaluation 03/21/21    PT Start Time 2355    PT Stop Time 0945    PT Time Calculation (min) 50 min    Activity Tolerance Patient tolerated treatment well    Behavior During Therapy [facility] for tasks assessed/performed             Past Medical History:  Diagnosis Date   Abnormal mammogram    Allergic rhinitis 10/30/2018   Anxiety in acute stress reaction 10/30/2018   Arthritis    Atypical chest pain 10/30/2018   Cancer [facility])    breast    Chest pain    "not cardiac related"   Colon polyps    Dyspnea    DOE   Family history of breast cancer 07/05/2020   Family history of pancreatic cancer 07/05/2020   Family history of prostate cancer 07/05/2020   GERD (gastroesophageal reflux disease) 10/30/2018   History of kidney stones    passed   Hypertension 10/30/2018   Hyperthyroidism    Hypothyroidism 10/30/2018   Migraine 10/30/2018   Osteoporosis 10/30/2018   Pneumonia    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   Vitamin D deficiency 10/30/2018    Past Surgical History:  Procedure Laterality Date   BREAST LUMPECTOMY WITH RADIOACTIVE SEED AND SENTINEL LYMPH NODE BIOPSY Left 08/15/2020   Procedure: LEFT BREAST LUMPECTOMY WITH RADIOACTIVE SEED AND SENTINEL LYMPH NODE BIOPSY;  Surgeon: Stark Klein, MD;  Location: [place];  Service: General;  Laterality: Left;  RNFA   COLONOSCOPY W/ POLYPECTOMY     ESOPHAGEAL DILATION     EYE SURGERY Bilateral    catheter with lens   HYSTERECTOMY ABDOMINAL WITH SALPINGECTOMY     PORT-A-CATH REMOVAL  01/04/2021   Procedure: REMOVAL  PORT-A-CATH;  Surgeon: Stark Klein, MD;  Location: [place];  Service: General;;   PORTACATH PLACEMENT Right 08/15/2020   Procedure: INSERTION PORT-A-CATH;  Surgeon: Stark Klein, MD;  Location: [place];  Service: General;  Laterality: Right;   TONSILLECTOMY     TUBAL LIGATION      There were no vitals filed for this visit.   Subjective Assessment - 03/09/21 0902     Subjective Pt says she is still having tenderness under her arm though she does not have any more open areas. The silvadene worked well for her.  She got an extender for her bra but it made it too large overall and she is not able to use it  She has the paper for the self MLD and is working on getting the sequence down.    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 She had left lumpectomy on 08/15/2020 wth 2 negative nodes removed. Ki67 is 80%. Radiation complete    Currently in Pain? No/denies                               [facility] Adult PT Treatment/Exercise - 03/09/21 0001       Exercises  Exercises Other Exercises;Shoulder      Shoulder Exercises: Sidelying   ABduction AROM;5 reps;Left    ABduction Limitations deep breath at the top for rib expansion      Manual Therapy   Manual Therapy Manual Lymphatic Drainage (MLD)    Manual therapy comments continued with hand over hand instruction and use of mirror to see effect of skin stretch during self MLD in supine and sidelying    Edema Management added small white foam pad with small dotted foam to wear inside bra at lateral chest careful to avoid healing skin at axilla and under breast    Manual Lymphatic Drainage (MLD) with pt permission: short neck, superficial abdominals and breathing, Rt axillary and Lt inguinal nodes then interaxillary pathway and left axillo inguinal pathway and then Left breast then retracing all steps                          PT Long Term Goals - 02/14/21 1141       PT LONG TERM GOAL #1   Title Pt will be independent in self MLD for long term management of lymphedema.    Time 5    Period Weeks    Status New    Target Date 03/21/21      PT LONG TERM GOAL #2   Title Pt will report a 50% improvement in L breast swelling to decrease risk of infection.    Time 5    Period Weeks    Status New    Target Date 03/21/21                   Plan - 03/09/21 0957     Clinical Impression Statement Pt is continuing to heal but has no more open areas.  She sill has pink areas under breast and at axilla.  Treatment today focused on self MLD techinique with hand over hand and use of mirror. Pt was able to return demonstrate with slower and lighter touch. She also did some remedial range of motion to neck , thorax and shoulder with frequent cues for deep breathing. Pt will practice tecnique this weekend    Stability/Clinical Decision Making Stable/Uncomplicated    Rehab Potential Excellent    PT Frequency 2x / week    PT Treatment/Interventions ADLs/Self Care Home Management;Therapeutic exercise;Patient/family education;Manual lymph drainage;Compression bandaging;Manual techniques;Scar mobilization    PT Next Visit Plan cont MLD to L breast and instruct - assess indep pt - check to see if any effect from foam at lateral chest  L-Dex screens every 3 months    PT Home Exercise Plan Post op shoulder ROM HEP and closed chain shoulder flexion, self MLD    Consulted and Agree with Plan of Care Patient    Family Member Consulted daughter             Patient will benefit from skilled therapeutic intervention in order to improve the following deficits and impairments:  Increased edema, Decreased knowledge of precautions, Pain, Postural dysfunction  Visit Diagnosis: Lymphedema, not elsewhere classified  Aftercare following surgery for neoplasm  Disorder of the skin and subcutaneous  tissue related to radiation, unspecified  Abnormal posture  Malignant neoplasm of upper-outer quadrant of left breast in female, estrogen receptor positive ([place])     Problem List Patient Active Problem List   Diagnosis Date Noted   Port-A-Cath in place 09/12/2020   Genetic testing 07/11/2020   Family history of breast cancer  07/05/2020   Family history of prostate cancer 07/05/2020   Family history of pancreatic cancer 07/05/2020   Malignant neoplasm of upper-outer quadrant of left breast in female, estrogen receptor negative ([place]) 06/30/2020   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/23/2019   Primary osteoarthritis of both hands 06/23/2019   Trigger thumb of left hand 06/23/2019   Polyarthralgia 06/23/2019   Hypertension 10/30/2018   Atypical chest pain 10/30/2018   Migraine 10/30/2018   Hypothyroidism 10/30/2018   Osteoporosis 10/30/2018   GERD (gastroesophageal reflux disease) 10/30/2018   Anxiety in acute stress reaction 10/30/2018   Allergic rhinitis 10/30/2018   Vitamin D deficiency 10/30/2018   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 Owens Shark PT  [place] [facility] 03/09/2021, 10:04 AM  [place], [place], [address] Phone: [phone]   Fax:  [phone]  Name: Earlean Fidalgo MRN: [id number] Date of Birth: [birth year]

## 2021-03-13 ENCOUNTER — Other Ambulatory Visit: Payer: Self-pay

## 2021-03-13 ENCOUNTER — Encounter: Payer: Self-pay | Admitting: Physical Therapy

## 2021-03-13 ENCOUNTER — Ambulatory Visit: Payer: Medicare Other | Admitting: Physical Therapy

## 2021-03-13 DIAGNOSIS — R293 Abnormal posture: Secondary | ICD-10-CM

## 2021-03-13 DIAGNOSIS — C50412 Malignant neoplasm of upper-outer quadrant of left female breast: Secondary | ICD-10-CM

## 2021-03-13 DIAGNOSIS — Z483 Aftercare following surgery for neoplasm: Secondary | ICD-10-CM

## 2021-03-13 DIAGNOSIS — L599 Disorder of the skin and subcutaneous tissue related to radiation, unspecified: Secondary | ICD-10-CM | POA: Diagnosis not present

## 2021-03-13 DIAGNOSIS — I890 Lymphedema, not elsewhere classified: Secondary | ICD-10-CM | POA: Diagnosis not present

## 2021-03-13 DIAGNOSIS — Z170 Estrogen receptor positive status [ER+]: Secondary | ICD-10-CM | POA: Diagnosis not present

## 2021-03-13 NOTE — Therapy (Signed)
[place], [place], [address] Phone: [phone]   Fax:  [phone]  Physical Therapy Treatment  Patient Details  Name: Cindy [name] MRN: [id number] Date of Birth: December 21, 1945 Referring Provider (PT): Reita May Date: 03/13/2021   PT End of Session - 03/13/21 0851     Visit Number 6    Number of Visits 9    Date for PT Re-Evaluation 03/21/21    PT Start Time 0804    PT Stop Time 0626    PT Time Calculation (min) 45 min    Activity Tolerance Patient tolerated treatment well    Behavior During Therapy [facility] for tasks assessed/performed             Past Medical History:  Diagnosis Date   Abnormal mammogram    Allergic rhinitis 10/30/2018   Anxiety in acute stress reaction 10/30/2018   Arthritis    Atypical chest pain 10/30/2018   Cancer [facility])    breast    Chest pain    "not cardiac related"   Colon polyps    Dyspnea    DOE   Family history of breast cancer 07/05/2020   Family history of pancreatic cancer 07/05/2020   Family history of prostate cancer 07/05/2020   GERD (gastroesophageal reflux disease) 10/30/2018   History of kidney stones    passed   Hypertension 10/30/2018   Hyperthyroidism    Hypothyroidism 10/30/2018   Migraine 10/30/2018   Osteoporosis 10/30/2018   Pneumonia    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   Vitamin D deficiency 10/30/2018    Past Surgical History:  Procedure Laterality Date   BREAST LUMPECTOMY WITH RADIOACTIVE SEED AND SENTINEL LYMPH NODE BIOPSY Left 08/15/2020   Procedure: LEFT BREAST LUMPECTOMY WITH RADIOACTIVE SEED AND SENTINEL LYMPH NODE BIOPSY;  Surgeon: Stark Klein, MD;  Location: [place];  Service: General;  Laterality: Left;  RNFA   COLONOSCOPY W/ POLYPECTOMY     ESOPHAGEAL DILATION     EYE SURGERY Bilateral    catheter with lens   HYSTERECTOMY ABDOMINAL WITH SALPINGECTOMY     PORT-A-CATH REMOVAL  01/04/2021   Procedure: REMOVAL  PORT-A-CATH;  Surgeon: Stark Klein, MD;  Location: [place];  Service: General;;   PORTACATH PLACEMENT Right 08/15/2020   Procedure: INSERTION PORT-A-CATH;  Surgeon: Stark Klein, MD;  Location: [place];  Service: General;  Laterality: Right;   TONSILLECTOMY     TUBAL LIGATION      There were no vitals filed for this visit.   Subjective Assessment - 03/13/21 0805     Subjective I have been using the new foam and it is helping. My breast is feeling great. No open spots no pain. I am feeling good with the self massage.    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 She had left lumpectomy on 08/15/2020 wth 2 negative nodes removed. Ki67 is 80%. Radiation complete    Patient Stated Goals to decrease the swelling    Currently in Pain? No/denies    Pain Score 0-No pain                               [facility] Adult PT Treatment/Exercise - 03/13/21 0001       Manual Therapy   Manual Lymphatic Drainage (MLD) with pt permission: short neck, superficial abdominals and breathing, Rt axillary and Lt  inguinal nodes then interaxillary pathway and left axillo inguinal pathway and then Left breast then retracing all steps                         PT Long Term Goals - 03/13/21 0806       PT LONG TERM GOAL #1   Title Pt will be independent in self MLD for long term management of lymphedema.    Baseline 03/13/21- pt feels independent with self MLD.    Time 5    Period Weeks    Status Achieved      PT LONG TERM GOAL #2   Title Pt will report a 50% improvement in L breast swelling to decrease risk of infection.    Baseline 03/13/21- 80% improvement    Time 5    Period Weeks    Status Achieved                   Plan - 03/13/21 2330     Clinical Impression Statement Assessed pt's progress towards goals in therapy. Pt has now met all goals for therapy. She  feels independent with self MLD and does not need further instruction. Pt feels her breast is nearly normal size again. Continued with MLD today and educated pt to wear her compression bra consistently at least through the next few weeks and then she can go a day or so without it and see if her swelling returns and if she notices swelling to go back to wearing her compression bra. Her swelling may be the result of radiation since it began during radiation. Also educated pt that she should do self MLD daily over the next few weeks and then she can try to go a day or so without to see how her body reacts to determine how often she needs to do MLD. Pt will be discharged from skilled PT services at this time.    PT Frequency 2x / week    PT Duration --   5 weeks   PT Treatment/Interventions ADLs/Self Care Home Management;Therapeutic exercise;Patient/family education;Manual lymph drainage;Compression bandaging;Manual techniques;Scar mobilization    PT Next Visit Plan d/c this visit - continue with L-Dex every 3 months    PT Home Exercise Plan Post op shoulder ROM HEP and closed chain shoulder flexion, self MLD    Consulted and Agree with Plan of Care Patient             Patient will benefit from skilled therapeutic intervention in order to improve the following deficits and impairments:  Increased edema, Decreased knowledge of precautions, Pain, Postural dysfunction  Visit Diagnosis: Lymphedema, not elsewhere classified  Aftercare following surgery for neoplasm  Disorder of the skin and subcutaneous tissue related to radiation, unspecified  Abnormal posture  Malignant neoplasm of upper-outer quadrant of left breast in female, estrogen receptor positive ([place])     Problem List Patient Active Problem List   Diagnosis Date Noted   Port-A-Cath in place 09/12/2020   Genetic testing 07/11/2020   Family history of breast cancer 07/05/2020   Family history of prostate cancer 07/05/2020    Family history of pancreatic cancer 07/05/2020   Malignant neoplasm of upper-outer quadrant of left breast in female, estrogen receptor negative ([place]) 06/30/2020   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/23/2019   Primary osteoarthritis of both hands 06/23/2019   Trigger thumb of left hand 06/23/2019   Polyarthralgia 06/23/2019   Hypertension 10/30/2018   Atypical chest pain  10/30/2018   Migraine 10/30/2018   Hypothyroidism 10/30/2018   Osteoporosis 10/30/2018   GERD (gastroesophageal reflux disease) 10/30/2018   Anxiety in acute stress reaction 10/30/2018   Allergic rhinitis 10/30/2018   Vitamin D deficiency 10/30/2018   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    Allyson Sabal [facility] 03/13/2021, 9:00 AM  [place], [place], [address] Phone: [phone]   Fax:  [phone]  Name: Cindy [name] MRN: [id number] Date of Birth: March 09, 1946  PHYSICAL THERAPY DISCHARGE SUMMARY  Visits from Start of Care: 6  Current functional level related to goals / functional outcomes: All goals met   Remaining deficits: None   Education / Equipment:compression bra, self MLD   Patient agrees to discharge. Patient goals were met. Patient is being discharged due to meeting the stated rehab goals.  [facility] [place], [place] 03/13/21 9:01 AM

## 2021-03-14 ENCOUNTER — Encounter: Payer: Medicare Other | Admitting: Physical Therapy

## 2021-03-20 ENCOUNTER — Encounter: Payer: Medicare Other | Admitting: Physical Therapy

## 2021-03-22 ENCOUNTER — Encounter: Payer: Medicare Other | Admitting: Physical Therapy

## 2021-03-30 ENCOUNTER — Ambulatory Visit
Admission: RE | Admit: 2021-03-30 | Discharge: 2021-03-30 | Disposition: A | Discharge status: Home / Self Care | Payer: Medicare Other | Source: Ambulatory Visit | Attending: Radiation Oncology | Admitting: Radiation Oncology

## 2021-03-30 ENCOUNTER — Encounter: Payer: Self-pay | Admitting: Radiation Oncology

## 2021-03-30 ENCOUNTER — Other Ambulatory Visit: Payer: Self-pay

## 2021-03-30 VITALS — BP 122/78 | HR 106 | Temp 96.4°F | Resp 18 | Ht 64.0 in | Wt 153.5 lb

## 2021-03-30 DIAGNOSIS — Z171 Estrogen receptor negative status [ER-]: Secondary | ICD-10-CM | POA: Insufficient documentation

## 2021-03-30 DIAGNOSIS — Z923 Personal history of irradiation: Secondary | ICD-10-CM | POA: Insufficient documentation

## 2021-03-30 DIAGNOSIS — Z79899 Other long term (current) drug therapy: Secondary | ICD-10-CM | POA: Insufficient documentation

## 2021-03-30 DIAGNOSIS — C50412 Malignant neoplasm of upper-outer quadrant of left female breast: Secondary | ICD-10-CM | POA: Diagnosis not present

## 2021-03-30 NOTE — Progress Notes (Signed)
Cindy [name] presents today for follow-up after completing radiation to her left breast on 02/16/2021  Pain: Reports mild soreness to left axilla, but states it's manageable and doesn't limit range of motion Skin: Reports skin is well healed now. Denies any lingering concerns/issues ROM: Patient denies any issues. Reports she has finished PT treatment as well Lymphedema: Patient denies and states PT did not register any readings either when they took measurements last MedOnc F/U: Scheduled for [place] with Annabelle Harman on 05/17/2021 Last saw Dr. Nicholas Lose on 02/12/2021 --RTC in 3 months for SCP visit --She received Prolia injection today for osteoporosis. --We will set her up for every 72-monthProlia injections.  I will see her back in 1 year.  Other issues of note: Overall she reports she feels good and is doing well. She does report right knee and elbow discomfort, but plans to reach out to an orthopedic doctor for assessment  Pt reports Yes No Comments  Tamoxifen '[]'$  '[x]'$    Letrozole '[]'$  '[x]'$    Anastrazole '[]'$  '[x]'$    Mammogram '[x]'$  Date: TBD '[]'$ 

## 2021-03-30 NOTE — Progress Notes (Signed)
Radiation Oncology         [phone] ________________________________  Name: Cindy [name] MRN: [id number]  Date: 03/30/2021  DOB: August 23, 1946  Follow-Up Visit Note  outpatient  CC: Cindy Reeve, DO  Cindy Lose, MD  Diagnosis and Prior Radiotherapy:    ICD-10-CM   1. Malignant neoplasm of upper-outer quadrant of left breast in female, estrogen receptor negative ([place])  C50.412    Z17.1       CHIEF COMPLAINT: Here for follow-up and surveillance of breast cancer  Narrative:  The patient returns today for routine follow-up.  Cindy [name] presents today for follow-up after completing radiation to her left breast on 02/16/2021.  She states her skin has healed well and she is no longer applying products to it  Pain: Reports mild soreness to left axilla, but states it's manageable and doesn't limit range of motion Skin: Reports skin is well healed now. Denies any lingering concerns/issues ROM: Patient denies any issues. Reports she has finished PT treatment as well Lymphedema: Patient denies and states PT did not register any readings either when they took measurements last MedOnc F/U: Scheduled for [place] with Cindy [name] on 05/17/2021 Last [name] Dr. Nicholas [name] on 02/12/2021 --RTC in 3 months for SCP visit --She received Prolia injection today for osteoporosis. --We will set her up for every 59-monthProlia injections.  I will see her back in 1 year.  Other issues of note: Overall she reports she feels good and is doing well. She does report right knee and elbow discomfort, but plans to reach out to an orthopedic doctor for assessment  Pt reports Yes No Comments  Tamoxifen '[]'$  '[x]'$    Letrozole '[]'$  '[x]'$    Anastrazole '[]'$  '[x]'$    Mammogram '[x]'$  Date: TBD '[]'$                                   ALLERGIES:  is allergic to sulfamethoxazole-trimethoprim and tetracyclines & related.  Meds: Current Outpatient Medications  Medication Sig Dispense Refill    acetaminophen (TYLENOL) 500 MG tablet Take 1,000 mg by mouth every 6 (six) hours as needed for moderate pain.     Calcium-Vitamin D-Vitamin K 500-100-40 MG-UNT-MCG CHEW Chew 2 tablets by mouth daily.     celecoxib (CELEBREX) 200 MG capsule One to 2 tablets by mouth daily as needed for pain. (Patient taking differently: Take 400 mg by mouth daily.) 180 capsule 3   cetirizine (ZYRTEC) 10 MG tablet Take 10 mg by mouth daily as needed for allergies.     cycloSPORINE (RESTASIS) 0.05 % ophthalmic emulsion Place 1 drop into both eyes 2 (two) times daily as needed (dry eyes).     denosumab (PROLIA) 60 MG/ML SOSY injection Inject 60 mg into the skin every 6 (six) months.     diclofenac sodium (VOLTAREN) 1 % GEL Apply 4 g topically 4 (four) times daily. (Patient taking differently: Apply 2 g topically 4 (four) times daily as needed (pain).) 100 g 11   escitalopram (LEXAPRO) 5 MG tablet Take 5 mg by mouth daily.     esomeprazole (NEXIUM) 40 MG capsule Take 1 capsule by mouth once daily 90 capsule 0   hydrochlorothiazide (HYDRODIURIL) 25 MG tablet TAKE 1 TABLET BY MOUTH ONCE DAILY AS NEEDED SWELLING 90 tablet 0   levothyroxine (SYNTHROID) 75 MCG tablet Take 1 tablet (75 mcg total) by mouth daily before breakfast. Take on Days 1-6 and skip Day 7-  NEED ANNUAL EXAM AND LABS (Patient taking differently: Take 75 mcg by mouth daily before breakfast.) 90 tablet 2   montelukast (SINGULAIR) 10 MG tablet Take 1 tablet (10 mg total) by mouth at bedtime. (Patient taking differently: Take 10 mg by mouth at bedtime as needed (allergies).) 90 tablet 1   Multiple Vitamins-Minerals (THRIVE FOR LIFE WOMENS PO) Take 1 tablet by mouth every morning. Also Thrive patch and Thrive milkshake daily     NIFEdipine (PROCARDIA-XL/NIFEDICAL-XL) 30 MG 24 hr tablet Take 1 tablet by mouth once daily (Patient taking differently: Take 30 mg by mouth daily.) 90 tablet 2   nitroGLYCERIN (NITROSTAT) 0.4 MG SL tablet Place 1 tablet (0.4 mg total)  under the tongue every 5 (five) minutes as needed for chest pain. 20 tablet 1   Probiotic Product (PROBIOTIC PO) Take 1 capsule by mouth daily.     rosuvastatin (CRESTOR) 20 MG tablet Take 1 tablet (20 mg total) by mouth daily. 90 tablet 3   silver sulfADIAZINE (SILVADENE) 1 % cream Apply 1 application topically 2 (two) times daily. Apply to areas of weeping up to twice a day. Be sure to gently remove previous application before applying new layer. Allow sore/raw area to stay open to air (or even a fan) periodically throughout the day/evening 50 g 0   SUMAtriptan (IMITREX) 100 MG tablet Take 0.5-1 tablets (50-100 mg total) by mouth every 2 (two) hours as needed for migraine (Max 2 pills per 24 hours). 10 tablet 3   vitamin B-12 (CYANOCOBALAMIN) 500 MCG tablet Take 500 mcg by mouth daily.      No current facility-administered medications for this encounter.    Physical Findings: The patient is in no acute distress. Patient is alert and oriented.  height is '5\' 4"'$  (1.626 m) and weight is 153 lb 8 oz (69.6 kg). Her temporal temperature is 96.4 F (35.8 C) (abnormal). Her blood pressure is 122/78 and her pulse is 106 (abnormal). Her respiration is 18 and oxygen saturation is 95%. .    Satisfactory skin healing in radiotherapy fields.  Skin is intact smooth and without any excessive dryness over the left breast.  Excellent resolution of pigmentation changes   Lab Findings: Lab Results  Component Value Date   WBC 4.4 12/19/2020   HGB 12.1 12/19/2020   HCT 36.2 12/19/2020   MCV 95.8 12/19/2020   PLT 195 12/19/2020    Radiographic Findings: No results found.  Impression/Plan: Healing well from radiotherapy to the breast tissue.  Continue skin care with topical Vitamin E Oil and / or lotion for at least 2 more months for further healing.  I encouraged her to continue with yearly mammography as appropriate (for intact breast tissue) and followup with medical oncology. I will see her back on an  as-needed basis. I have encouraged her to call if she has any issues or concerns in the future. I wished her the very best.  On date of service, in total, I spent 15 minutes on this encounter. Patient was seen in person.  _____________________________________   Eppie Gibson, MD

## 2021-04-08 ENCOUNTER — Encounter: Payer: Self-pay | Admitting: Hematology and Oncology

## 2021-04-08 NOTE — Progress Notes (Signed)
                                                                                                                                                             Patient Name: Cindy [name] MRN: [id number] DOB: 1945-12-15 Referring Physician: Nicholas Lose (Profile Not Attached) Date of Service: 02/16/2021 [facility] [place]                                                        End Of Treatment Note  Diagnoses: C50.412-Malignant neoplasm of upper-outer quadrant of left female breast  Cancer Staging: Cancer Staging Malignant neoplasm of upper-outer quadrant of left breast in female, estrogen receptor negative ([place]) Staging form: Breast, AJCC 8th Edition - Clinical stage from 07/05/2020: Stage IB (cT1c, cN0, cM0, G3, ER-, PR-, HER2-) - Signed by Nicholas Lose, MD on 07/05/2020 Stage prefix: Initial diagnosis Histologic grading system: 3 grade system  Intent: Curative  Radiation Treatment Dates: 01/18/2021 through 02/16/2021 Site Technique Total Dose (Gy) Dose per Fx (Gy) Completed Fx Beam Energies  Breast, Left: Breast_Lt 3D 42.56/42.56 2.66 16/16 6X, 10X  Breast, Left: Breast_Lt_Bst 3D 10/10 2 5/5 6X, 10X   Narrative: The patient tolerated radiation therapy relatively well.   Plan: The patient will follow-up with radiation oncology in 93mo. -----------------------------------  Eppie Gibson, MD

## 2021-04-10 ENCOUNTER — Ambulatory Visit (INDEPENDENT_AMBULATORY_CARE_PROVIDER_SITE_OTHER): Payer: Medicare Other

## 2021-04-10 ENCOUNTER — Ambulatory Visit (INDEPENDENT_AMBULATORY_CARE_PROVIDER_SITE_OTHER): Payer: Medicare Other | Admitting: Sports Medicine

## 2021-04-10 ENCOUNTER — Other Ambulatory Visit: Payer: Self-pay

## 2021-04-10 DIAGNOSIS — M1712 Unilateral primary osteoarthritis, left knee: Secondary | ICD-10-CM | POA: Diagnosis not present

## 2021-04-10 DIAGNOSIS — M25561 Pain in right knee: Secondary | ICD-10-CM

## 2021-04-10 DIAGNOSIS — M1711 Unilateral primary osteoarthritis, right knee: Secondary | ICD-10-CM | POA: Diagnosis not present

## 2021-04-10 DIAGNOSIS — M25562 Pain in left knee: Secondary | ICD-10-CM | POA: Diagnosis not present

## 2021-04-10 DIAGNOSIS — M170 Bilateral primary osteoarthritis of knee: Secondary | ICD-10-CM

## 2021-04-10 DIAGNOSIS — G8929 Other chronic pain: Secondary | ICD-10-CM

## 2021-04-10 NOTE — Assessment & Plan Note (Signed)
Last discussed in 2020, was doing well with Celebrex, now having worsening pain and spite of NSAIDs. She has significant lateral subluxation of the tibia consistent with worsening/end-stage osteoarthritis, injected today, getting updated x-rays, home conditioning exercises given. She does want an MRI, I advised her we probably need to wait until the 6-week point and it would likely not change the plan. If insufficient improvement after steroid injection we will proceed with viscosupplementation at the follow-up.

## 2021-04-10 NOTE — Progress Notes (Signed)
    Procedures performed today:    Procedure: Real-time Ultrasound Guided injection of the right knee Device: Samsung [id number]  Verbal informed consent obtained.  Time-out conducted.  Noted no overlying erythema, induration, or other signs of local infection.  Skin prepped in a sterile fashion.  Local anesthesia: Topical Ethyl chloride.  With sterile technique and under real time ultrasound guidance: noted trace effusion, 1 cc Kenalog 40, 2 cc lidocaine, 2 cc bupivacaine injected easily Completed without difficulty  Advised to call if fevers/chills, erythema, induration, drainage, or persistent bleeding.  Images permanently stored and available for review in PACS.  Impression: Technically successful ultrasound guided injection.  Independent interpretation of notes and tests performed by another provider:   None.  Brief History, Exam, Impression, and Recommendations:    Primary osteoarthritis of both knees Last discussed in 2020, was doing well with Celebrex, now having worsening pain and spite of NSAIDs. She has significant lateral subluxation of the tibia consistent with worsening/end-stage osteoarthritis, injected today, getting updated x-rays, home conditioning exercises given. She does want an MRI, I advised her we probably need to wait until the 6-week point and it would likely not change the plan. If insufficient improvement after steroid injection we will proceed with viscosupplementation at the follow-up.    ___________________________________________ Gwen Her. Dianah Field, M.D., ABFM., CAQSM. Primary Care and [place] [employer] [facility] [employer] [facility]

## 2021-05-11 DIAGNOSIS — Z9889 Other specified postprocedural states: Secondary | ICD-10-CM | POA: Diagnosis not present

## 2021-05-11 DIAGNOSIS — C50412 Malignant neoplasm of upper-outer quadrant of left female breast: Secondary | ICD-10-CM | POA: Diagnosis not present

## 2021-05-11 DIAGNOSIS — Z171 Estrogen receptor negative status [ER-]: Secondary | ICD-10-CM | POA: Diagnosis not present

## 2021-05-16 ENCOUNTER — Encounter: Payer: Self-pay | Admitting: Adult Health

## 2021-05-17 ENCOUNTER — Other Ambulatory Visit: Payer: Self-pay

## 2021-05-17 ENCOUNTER — Encounter: Payer: Self-pay | Admitting: Adult Health

## 2021-05-17 ENCOUNTER — Inpatient Hospital Stay: Payer: Medicare Other | Attending: Radiation Oncology | Admitting: Adult Health

## 2021-05-17 VITALS — BP 106/77 | HR 105 | Temp 97.9°F | Resp 18 | Ht 64.0 in | Wt 152.0 lb

## 2021-05-17 DIAGNOSIS — Z923 Personal history of irradiation: Secondary | ICD-10-CM | POA: Diagnosis not present

## 2021-05-17 DIAGNOSIS — E2839 Other primary ovarian failure: Secondary | ICD-10-CM

## 2021-05-17 DIAGNOSIS — C50412 Malignant neoplasm of upper-outer quadrant of left female breast: Secondary | ICD-10-CM | POA: Diagnosis not present

## 2021-05-17 DIAGNOSIS — N6320 Unspecified lump in the left breast, unspecified quadrant: Secondary | ICD-10-CM | POA: Insufficient documentation

## 2021-05-17 DIAGNOSIS — Z171 Estrogen receptor negative status [ER-]: Secondary | ICD-10-CM | POA: Diagnosis not present

## 2021-05-17 DIAGNOSIS — N644 Mastodynia: Secondary | ICD-10-CM | POA: Diagnosis not present

## 2021-05-17 NOTE — Assessment & Plan Note (Signed)
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 

## 2021-05-17 NOTE — Progress Notes (Signed)
SURVIVORSHIP VISIT:    BRIEF ONCOLOGIC HISTORY:  Oncology History  Malignant neoplasm of upper-outer quadrant of left breast in female, estrogen receptor negative ([place])  06/30/2020 Initial Diagnosis   Screening mammogram showed a possible left breast mass. Diagnostic mammogram showed two adjacent masses at the 1 o'clock position, 0.7cm and 0.8cm, about 0.5cm apart, and spanning in total 1.8cm, no left axillary adenopathy. Biopsy showed IDC with squamous differentiation and DCIS, grade 3, HER-2 negative (1+), ER/PR negative, Ki67 80%.    07/05/2020 Cancer Staging   Staging form: Breast, AJCC 8th Edition - Clinical stage from 07/05/2020: Stage IB (cT1c, cN0, cM0, G3, ER-, PR-, HER2-) - Signed by Nicholas Lose, MD on 07/05/2020   07/11/2020 Genetic Testing   Negative genetic testing: no mutations detected in [facility] Common Hereditary Cancers Panel.  The report date is July 11, 2020.   The Common Hereditary Cancers Panel offered by [facility] includes sequencing and/or deletion duplication testing of the following 48 genes: APC, ATM, AXIN2, BARD1, BMPR1A, BRCA1, BRCA2, BRIP1, CDH1, CDK4, CDKN2A (p14ARF), CDKN2A (p16INK4a), CHEK2, CTNNA1, DICER1, EPCAM (Deletion/duplication testing only), GREM1 (promoter region deletion/duplication testing only), KIT, MEN1, MLH1, MSH2, MSH3, MSH6, MUTYH, NBN, NF1, NHTL1, PALB2, PDGFRA, PMS2, POLD1, POLE, PTEN, RAD50, RAD51C, RAD51D, RNF43, SDHB, SDHC, SDHD, SMAD4, SMARCA4. STK11, TP53, TSC1, TSC2, and VHL.  The following genes were evaluated for sequence changes only: SDHA and HOXB13 c.251G>A variant only.   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   08/15/2020 Cancer Staging   Staging form: Breast, AJCC 8th Edition - Pathologic stage from 08/15/2020: Stage IB (pT1c, pN0, cM0, G3, ER-, PR-, HER2-) - Signed by Gardenia Phlegm, NP on 05/17/2021 Stage prefix: Initial diagnosis Histologic grading system: 3 grade system   09/05/2020 - 12/21/2020 Chemotherapy   CMF x 6 cycles        01/18/2021 - 02/16/2021 Radiation Therapy   Adjuvant radiation therapy with Dr. Isidore Moos     INTERVAL HISTORY:  Ms. Kush to review her survivorship care plan detailing her treatment course for breast cancer, as well as monitoring long-term side effects of that treatment, education regarding health maintenance, screening, and overall wellness and health promotion.     Overall, Ms. Orzechowski reports feeling moderately well.  She has been struggling with left breast swelling that isn't improving despite physical therapy.  She says it started with radiation therapy, and is still quite difficult.  She notes an increased amount of pain.  She saw Dr. Marlowe Aschoff PA last week who noted it could be an infection or fluid accumulation.  They were going to order an ultrasound and she hasn't heard anything about this.    REVIEW OF SYSTEMS:  Review of Systems  Constitutional:  Negative for appetite change, chills, fatigue, fever and unexpected weight change.  HENT:   Negative for hearing loss, lump/mass and trouble swallowing.   Eyes:  Negative for eye problems and icterus.  Respiratory:  Negative for chest tightness, cough and shortness of breath.   Cardiovascular:  Negative for chest pain, leg swelling and palpitations.  Gastrointestinal:  Negative for abdominal distention, abdominal pain, constipation, diarrhea, nausea and vomiting.  Endocrine: Negative for hot flashes.  Genitourinary:  Negative for difficulty urinating.   Musculoskeletal:  Negative for arthralgias.  Skin:  Negative for itching and rash.  Neurological:  Negative for dizziness, extremity weakness, headaches and numbness.  Hematological:  Negative  for adenopathy. Does not bruise/bleed easily.  Psychiatric/Behavioral:  Negative for depression. The patient is not  nervous/anxious.   Breast: Denies any new nodularity, masses, tenderness, nipple changes, or nipple discharge.      ONCOLOGY TREATMENT TEAM:  1. Surgeon:  Dr. Barry Dienes at [facility] 2. Medical Oncologist: Dr. Lindi Adie  3. Radiation Oncologist: Dr. Isidore Moos    PAST MEDICAL/SURGICAL HISTORY:  Past Medical History:  Diagnosis Date   Abnormal mammogram    Allergic rhinitis 10/30/2018   Anxiety in acute stress reaction 10/30/2018   Arthritis    Atypical chest pain 10/30/2018   Cancer [facility])    breast    Chest pain    "not cardiac related"   Colon polyps    Dyspnea    DOE   Family history of breast cancer 07/05/2020   Family history of pancreatic cancer 07/05/2020   Family history of prostate cancer 07/05/2020   GERD (gastroesophageal reflux disease) 10/30/2018   History of kidney stones    passed   Hypertension 10/30/2018   Hyperthyroidism    Hypothyroidism 10/30/2018   Migraine 10/30/2018   Osteoporosis 10/30/2018   Pneumonia    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   Vitamin D deficiency 10/30/2018   Past Surgical History:  Procedure Laterality Date   BREAST LUMPECTOMY WITH RADIOACTIVE SEED AND SENTINEL LYMPH NODE BIOPSY Left 08/15/2020   Procedure: LEFT BREAST LUMPECTOMY WITH RADIOACTIVE SEED AND SENTINEL LYMPH NODE BIOPSY;  Surgeon: Stark Klein, MD;  Location: [place];  Service: General;  Laterality: Left;  RNFA   COLONOSCOPY W/ POLYPECTOMY     ESOPHAGEAL DILATION     EYE SURGERY Bilateral    catheter with lens   HYSTERECTOMY ABDOMINAL WITH SALPINGECTOMY     PORT-A-CATH REMOVAL  01/04/2021   Procedure: REMOVAL PORT-A-CATH;  Surgeon: Stark Klein, MD;  Location: [place];  Service: General;;   PORTACATH PLACEMENT Right 08/15/2020   Procedure: INSERTION PORT-A-CATH;  Surgeon: Stark Klein, MD;  Location: [place];  Service: General;  Laterality: Right;   TONSILLECTOMY     TUBAL LIGATION       ALLERGIES:  Allergies  Allergen Reactions    Sulfamethoxazole-Trimethoprim Rash   Tetracyclines & Related Rash     CURRENT MEDICATIONS:  Outpatient Encounter Medications as of 05/17/2021  Medication Sig   Calcium-Vitamin D-Vitamin K 500-100-40 MG-UNT-MCG CHEW Chew 2 tablets by mouth daily.   celecoxib (CELEBREX) 200 MG capsule One to 2 tablets by mouth daily as needed for pain. (Patient taking differently: Take 400 mg by mouth daily.)   cetirizine (ZYRTEC) 10 MG tablet Take 10 mg by mouth daily as needed for allergies.   denosumab (PROLIA) 60 MG/ML SOSY injection Inject 60 mg into the skin every 6 (six) months.   diclofenac sodium (VOLTAREN) 1 % GEL Apply 4 g topically 4 (four) times daily. (Patient taking differently: Apply 2 g topically 4 (four) times daily as needed (pain).)   escitalopram (LEXAPRO) 5 MG tablet Take 5 mg by mouth daily.   esomeprazole (NEXIUM) 40 MG capsule Take 1 capsule by mouth once daily   hydrochlorothiazide (HYDRODIURIL) 25 MG tablet TAKE 1 TABLET BY MOUTH ONCE DAILY AS NEEDED SWELLING   levothyroxine (SYNTHROID) 75 MCG tablet Take 1 tablet (75 mcg total) by mouth daily before breakfast. Take on Days 1-6 and skip Day 7- NEED ANNUAL EXAM AND LABS (Patient taking differently: Take 75 mcg by mouth daily before breakfast.)   montelukast (SINGULAIR) 10 MG tablet Take 1 tablet (10 mg total)  by mouth at bedtime. (Patient taking differently: Take 10 mg by mouth at bedtime as needed (allergies).)   Multiple Vitamins-Minerals (THRIVE FOR LIFE WOMENS PO) Take 1 tablet by mouth every morning. Also Thrive patch and Thrive milkshake daily   NIFEdipine (PROCARDIA-XL/NIFEDICAL-XL) 30 MG 24 hr tablet Take 1 tablet by mouth once daily (Patient taking differently: Take 30 mg by mouth daily.)   Probiotic Product (PROBIOTIC PO) Take 1 capsule by mouth daily.   SUMAtriptan (IMITREX) 100 MG tablet Take 0.5-1 tablets (50-100 mg total) by mouth every 2 (two) hours as needed for migraine (Max 2 pills per 24 hours).   vitamin B-12  (CYANOCOBALAMIN) 500 MCG tablet Take 500 mcg by mouth daily.    acetaminophen (TYLENOL) 500 MG tablet Take 1,000 mg by mouth every 6 (six) hours as needed for moderate pain.   cycloSPORINE (RESTASIS) 0.05 % ophthalmic emulsion Place 1 drop into both eyes 2 (two) times daily as needed (dry eyes).   nitroGLYCERIN (NITROSTAT) 0.4 MG SL tablet Place 1 tablet (0.4 mg total) under the tongue every 5 (five) minutes as needed for chest pain. (Patient not taking: Reported on 05/17/2021)   rosuvastatin (CRESTOR) 20 MG tablet Take 1 tablet (20 mg total) by mouth daily.   No facility-administered encounter medications on file as of 05/17/2021.     ONCOLOGIC FAMILY HISTORY:  Family History  Problem Relation Age of Onset   High blood pressure Mother    Breast cancer Mother        dx before 23   Alzheimer's disease Mother    Heart attack Father    Prostate cancer Brother 34   Breast cancer Maternal Aunt        dx 40s   Breast cancer Maternal Aunt        dx 91s   Breast cancer Maternal Grandmother        dx after 19   Leukemia Paternal Grandfather        dx 45s-80s   Pancreatic cancer Cousin        maternal; dx 36s   Cancer Maternal Aunt 50       unknown type   Breast cancer Cousin        paternal; dx 51s    SOCIAL HISTORY:  Social History   Socioeconomic History   Marital status: Married    Spouse name: Not on file   Number of children: 2   Years of education: Not on file   Highest education level: Not on file  Occupational History   Not on file  Tobacco Use   Smoking status: Never   Smokeless tobacco: Never  Vaping Use   Vaping Use: Never used  Substance and Sexual Activity   Alcohol use: Never   Drug use: Never   Sexual activity: Yes    Partners: Male  Other Topics Concern   Not on file  Social History Narrative   Not on file   Social Determinants of Health   Financial Resource Strain: Not on file  Food Insecurity: No Food Insecurity   Worried About Running Out of Food  in the Last Year: Never true   [place] in the Last Year: Never true  Transportation Needs: No Transportation Needs   Lack of Transportation (Medical): No   Lack of Transportation (Non-Medical): No  Physical Activity: Not on file  Stress: Not on file  Social Connections: Not on file  Intimate Partner Violence: Not on file     OBSERVATIONS/OBJECTIVE:  BP 106/77 (BP Location: Left Arm, Patient Position: Sitting)   Pulse (!) 105   Temp 97.9 F (36.6 C) (Temporal)   Resp 18   Ht 5' 4"  (1.626 m)   Wt 152 lb (68.9 kg)   SpO2 97%   BMI 26.09 kg/m  GENERAL: Patient is a well appearing female in no acute distress HEENT:  Sclerae anicteric.  Oropharynx clear and moist. No ulcerations or evidence of oropharyngeal candidiasis. Neck is supple.  NODES:  No cervical, supraclavicular, or axillary lymphadenopathy palpated.  BREAST EXAM:  left breast s/p lumpectomy and radiation, mild swelling and focal tenderness noted, right breast benign LUNGS:  Clear to auscultation bilaterally.  No wheezes or rhonchi. HEART:  Regular rate and rhythm. No murmur appreciated. ABDOMEN:  Soft, nontender.  Positive, normoactive bowel sounds. No organomegaly palpated. MSK:  No focal spinal tenderness to palpation. Full range of motion bilaterally in the upper extremities. EXTREMITIES:  No peripheral edema.   SKIN:  Clear with no obvious rashes or skin changes. No nail dyscrasia. NEURO:  Nonfocal. Well oriented.  Appropriate affect.   LABORATORY DATA:  None for this visit.  DIAGNOSTIC IMAGING:  None for this visit.      ASSESSMENT AND PLAN:  Ms.. [name] is a pleasant 75 y.o. female with Stage IB left breast invasive ductal carcinoma, ER+/PR+/HER2-, diagnosed in 06/2020, treated with lumpectomy, adjuvant chemotherapy, and adjuvant radiation therapy.  She presents to the Survivorship Clinic for our initial meeting and routine follow-up post-completion of treatment for breast cancer.    1. Stage IB  left breast cancer:  Ms. Najarro is continuing to recover from definitive treatment for breast cancer. She will follow-up with her medical oncologist, Dr. Lindi Adie in 3 months with history and physical exam per surveillance protocol. Her mammogram is due 08/2021; orders placed today. Today, a comprehensive survivorship care plan and treatment summary was reviewed with the patient today detailing her breast cancer diagnosis, treatment course, potential late/long-term effects of treatment, appropriate follow-up care with recommendations for the future, and patient education resources.  A copy of this summary, along with a letter will be sent to the patient's primary care provider via mail/fax/In Basket message after today's visit.    2. Breast pain and swelling: I placed an ultrasound for her to undergo, Dr. Barry Dienes plans on following up with her next week after this is completed.    3. Bone health:  Given Ms. Mcmeekin's age/history of breast cancer, she is at risk for bone demineralization.  Her last DEXA scan was 05/10/2020 which showed osteoporosis with a t score of -3.3 in the spine.  She is receiving prolia with [place] every 6 months.  She was given education on specific activities to promote bone health.  4. Cancer screening:  Due to Ms. Quito's history and her age, she should receive screening for skin cancers, colon cancer, and gynecologic cancers.  The information and recommendations are listed on the patient's comprehensive care plan/treatment summary and were reviewed in detail with the patient.    5. Health maintenance and wellness promotion: Ms. Cauthon was encouraged to consume 5-7 servings of fruits and vegetables per day.  She was also encouraged to engage in moderate to vigorous exercise for 30 minutes per day most days of the week. We discussed the [employer] fitness program, which is designed for cancer survivors to help them become more physically fit after cancer treatments.  She was  instructed to limit her alcohol consumption and continue to abstain from tobacco  use.     6. Support services/counseling: It is not uncommon for this period of the patient's cancer care trajectory to be one of many emotions and stressors.    She was given information regarding our available services and encouraged to contact me with any questions or for help enrolling in any of our support group/programs.    Follow up instructions:    -Return to cancer center 08/2021 for follow up with Dr. Lindi Adie  -Mammogram due in 08/2021 -Bone density in 10/2021 -Follow up with surgery as scheduled -She is welcome to return back to the Survivorship Clinic at any time; no additional follow-up needed at this time.  -Consider referral back to survivorship as a long-term survivor for continued surveillance  The patient was provided an opportunity to ask questions and all were answered. The patient agreed with the plan and demonstrated an understanding of the instructions.   Total encounter time: 40 minutes  Wilber Bihari, NP 05/17/21 11:36 AM Medical Oncology and Hematology [facility] [place], [place] [address] Tel. [phone]    Fax. [phone]  *Total Encounter Time as defined by the Centers for Medicare and Medicaid Services includes, in addition to the face-to-face time of a patient visit (documented in the note above) non-face-to-face time: obtaining and reviewing outside history, ordering and reviewing medications, tests or procedures, care coordination (communications with other health care professionals or caregivers) and documentation in the medical record.

## 2021-05-19 ENCOUNTER — Encounter: Payer: Self-pay | Admitting: Hematology and Oncology

## 2021-05-22 ENCOUNTER — Ambulatory Visit (INDEPENDENT_AMBULATORY_CARE_PROVIDER_SITE_OTHER): Payer: Medicare Other | Admitting: Sports Medicine

## 2021-05-22 DIAGNOSIS — M170 Bilateral primary osteoarthritis of knee: Secondary | ICD-10-CM | POA: Diagnosis not present

## 2021-05-22 NOTE — Assessment & Plan Note (Signed)
.  Returns, she is a pleasant 75 year old female, right knee osteoarthritis, injected the last visit doing really well, she will let me know if she would like to do [place], otherwise return as needed.

## 2021-05-22 NOTE — Progress Notes (Signed)
    Procedures performed today:    None.  Independent interpretation of notes and tests performed by another provider:   None.  Brief History, Exam, Impression, and Recommendations:    Primary osteoarthritis of both knees .Returns, she is a pleasant 75 year old female, right knee osteoarthritis, injected the last visit doing really well, she will let me know if she would like to do [place], otherwise return as needed.    ___________________________________________ Gwen Her. Dianah Field, M.D., ABFM., CAQSM. Primary Care and [place] Instructor of [place] of [facility] of Medicine

## 2021-06-02 ENCOUNTER — Other Ambulatory Visit: Payer: Self-pay | Admitting: Adult Health

## 2021-06-02 ENCOUNTER — Ambulatory Visit
Admission: RE | Admit: 2021-06-02 | Discharge: 2021-06-02 | Disposition: A | Discharge status: Home / Self Care | Payer: Medicare Other | Source: Ambulatory Visit | Attending: Adult Health | Admitting: Adult Health

## 2021-06-02 DIAGNOSIS — C50412 Malignant neoplasm of upper-outer quadrant of left female breast: Secondary | ICD-10-CM

## 2021-06-02 DIAGNOSIS — R922 Inconclusive mammogram: Secondary | ICD-10-CM | POA: Diagnosis not present

## 2021-06-02 DIAGNOSIS — Z171 Estrogen receptor negative status [ER-]: Secondary | ICD-10-CM

## 2021-06-02 DIAGNOSIS — Z853 Personal history of malignant neoplasm of breast: Secondary | ICD-10-CM | POA: Diagnosis not present

## 2021-06-02 DIAGNOSIS — N6489 Other specified disorders of breast: Secondary | ICD-10-CM | POA: Diagnosis not present

## 2021-06-02 HISTORY — DX: Personal history of antineoplastic chemotherapy: Z92.21

## 2021-06-02 HISTORY — DX: Personal history of irradiation: Z92.3

## 2021-06-04 ENCOUNTER — Encounter: Payer: Self-pay | Admitting: Adult Health

## 2021-06-12 ENCOUNTER — Ambulatory Visit (INDEPENDENT_AMBULATORY_CARE_PROVIDER_SITE_OTHER): Payer: Medicare Other | Admitting: Family Medicine

## 2021-06-12 ENCOUNTER — Encounter: Payer: Self-pay | Admitting: Family Medicine

## 2021-06-12 VITALS — BP 126/81 | HR 81 | Temp 98.7°F | Wt 154.1 lb

## 2021-06-12 DIAGNOSIS — M19041 Primary osteoarthritis, right hand: Secondary | ICD-10-CM | POA: Diagnosis not present

## 2021-06-12 DIAGNOSIS — E039 Hypothyroidism, unspecified: Secondary | ICD-10-CM

## 2021-06-12 DIAGNOSIS — H6981 Other specified disorders of Eustachian tube, right ear: Secondary | ICD-10-CM | POA: Diagnosis not present

## 2021-06-12 DIAGNOSIS — F430 Acute stress reaction: Secondary | ICD-10-CM

## 2021-06-12 DIAGNOSIS — I10 Essential (primary) hypertension: Secondary | ICD-10-CM

## 2021-06-12 DIAGNOSIS — K219 Gastro-esophageal reflux disease without esophagitis: Secondary | ICD-10-CM | POA: Diagnosis not present

## 2021-06-12 DIAGNOSIS — F411 Generalized anxiety disorder: Secondary | ICD-10-CM

## 2021-06-12 DIAGNOSIS — J309 Allergic rhinitis, unspecified: Secondary | ICD-10-CM

## 2021-06-12 DIAGNOSIS — M19042 Primary osteoarthritis, left hand: Secondary | ICD-10-CM

## 2021-06-12 DIAGNOSIS — G43909 Migraine, unspecified, not intractable, without status migrainosus: Secondary | ICD-10-CM | POA: Diagnosis not present

## 2021-06-12 DIAGNOSIS — I2510 Atherosclerotic heart disease of native coronary artery without angina pectoris: Secondary | ICD-10-CM

## 2021-06-12 MED ORDER — CETIRIZINE HCL 10 MG PO TABS: 10.0000 mg | ORAL_TABLET | Freq: Every day | ORAL | 0 refills | Status: DC | PRN

## 2021-06-12 MED ORDER — HYDROCHLOROTHIAZIDE 25 MG PO TABS: ORAL_TABLET | ORAL | 0 refills | Status: DC

## 2021-06-12 MED ORDER — FLUTICASONE PROPIONATE 50 MCG/ACT NA SUSP: 2.0000 | Freq: Every day | NASAL | 2 refills | Status: DC

## 2021-06-12 MED ORDER — ROSUVASTATIN CALCIUM 20 MG PO TABS: 20.0000 mg | ORAL_TABLET | Freq: Every day | ORAL | 1 refills | Status: DC

## 2021-06-12 MED ORDER — LEVOTHYROXINE SODIUM 75 MCG PO TABS: 75.0000 ug | ORAL_TABLET | Freq: Every day | ORAL | 0 refills | Status: DC

## 2021-06-12 MED ORDER — ESCITALOPRAM OXALATE 5 MG PO TABS: 5.0000 mg | ORAL_TABLET | Freq: Every day | ORAL | 2 refills | Status: DC

## 2021-06-12 MED ORDER — SUMATRIPTAN SUCCINATE 100 MG PO TABS: 50.0000 mg | ORAL_TABLET | ORAL | 3 refills | Status: DC | PRN

## 2021-06-12 MED ORDER — CELECOXIB 200 MG PO CAPS: ORAL_CAPSULE | ORAL | 3 refills | Status: DC

## 2021-06-12 MED ORDER — ESOMEPRAZOLE MAGNESIUM 40 MG PO CPDR: 40.0000 mg | DELAYED_RELEASE_CAPSULE | Freq: Every day | ORAL | 0 refills | Status: DC

## 2021-06-12 MED ORDER — NITROGLYCERIN 0.4 MG SL SUBL: 0.4000 mg | SUBLINGUAL_TABLET | SUBLINGUAL | 1 refills | Status: AC | PRN

## 2021-06-12 NOTE — Progress Notes (Signed)
Acute Office Visit  Subjective:    [name], female    DOB: 1945-10-07, 75 y.o.   MRN: [id number]  Chief Complaint  Patient presents with   Medication Refill   Otalgia    Right      Patient is in today for otalgia and routine med refills.  Ear pain: Patient reports that ever since the weather changed a few weeks ago, she has had some right ear pressure/pain (cannot put on pain scale). Additional symptoms include occasional rhinorrhea, PND, nasal congestion, cough. She never tested for COVID. States she always has some allergy/sinus flare when the seasons changes. Reports symptoms are mild and seem to be getting better. She denies in hearing changes, fevers, dizziness, trouble breathing, chest pain, body aches, sore throat.    HYPERTENSION: - Medications: nifedipine, HCTZ,  - Compliance: yes - Checking BP at home: yes, 120/80s - Denies any SOB, recurrent headaches, CP, vision changes, LE edema, dizziness, palpitations, or medication side effects. - Diet: well-balanced, "not as good as it should be," low-sodium - Exercise: minimal - Stressors: recently finished breast cancer chemo/radiation   HYPOTHYROIDISM Current Medication and Dosages: Thyroid control status:controlled Satisfied with current treatment? yes Medication side effects: no Medication compliance: excellent compliance No cold/heat intolerance, weight changes, GI/GU symptoms, palpitations, edema, mood changes.   MIGRAINES: Imitrex PRN- last time needed was a few months ago No recent migraines, tends to be able to get rid of headaches with tylenol first   ANXIETY/STRESS Lexapro No side effects Mood has been fine  Denies suicidal/homicidal ideation [place] Office Visit from 06/12/2021 in [place]  PHQ-2 Total Score 0          Past Medical History:  Diagnosis Date   Abnormal mammogram    Allergic rhinitis 10/30/2018   Anxiety in  acute stress reaction 10/30/2018   Arthritis    Atypical chest pain 10/30/2018   Cancer ([place])    breast    Chest pain    "not cardiac related"   Colon polyps    Dyspnea    DOE   Family history of breast cancer 07/05/2020   Family history of pancreatic cancer 07/05/2020   Family history of prostate cancer 07/05/2020   GERD (gastroesophageal reflux disease) 10/30/2018   History of kidney stones    passed   Hypertension 10/30/2018   Hyperthyroidism    Hypothyroidism 10/30/2018   Migraine 10/30/2018   Osteoporosis 10/30/2018   Personal history of chemotherapy    Personal history of radiation therapy    Pneumonia    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   Vitamin D deficiency 10/30/2018    Past Surgical History:  Procedure Laterality Date   BREAST BIOPSY Left 06/26/2020   x2   BREAST LUMPECTOMY Left 08/15/2020   BREAST LUMPECTOMY WITH RADIOACTIVE SEED AND SENTINEL LYMPH NODE BIOPSY Left 08/15/2020   Procedure: LEFT BREAST LUMPECTOMY WITH RADIOACTIVE SEED AND SENTINEL LYMPH NODE BIOPSY;  Surgeon: Stark Klein, MD;  Location: [facility];  Service: General;  Laterality: Left;  RNFA   COLONOSCOPY W/ POLYPECTOMY     ESOPHAGEAL DILATION     EYE SURGERY Bilateral    catheter with lens   HYSTERECTOMY ABDOMINAL WITH SALPINGECTOMY     PORT-A-CATH REMOVAL  01/04/2021   Procedure: REMOVAL PORT-A-CATH;  Surgeon: Stark Klein, MD;  Location: [facility];  Service: General;;   PORTACATH PLACEMENT Right 08/15/2020   Procedure: INSERTION PORT-A-CATH;  Surgeon: Stark Klein, MD;  [facility];  Service: General;  Laterality: Right;   TONSILLECTOMY     TUBAL LIGATION      Family History  Problem Relation Age of Onset   High blood pressure Mother    Breast cancer Mother        dx before 68   Alzheimer's disease Mother    Heart attack Father    Prostate cancer Brother 57   Breast cancer Maternal Aunt        dx 44s   Breast cancer Maternal Aunt        dx 90s    Breast cancer Maternal Grandmother        dx after 45   Leukemia Paternal Grandfather        dx 68s-80s   Pancreatic cancer Cousin        maternal; dx 44s   Cancer Maternal Aunt 50       unknown type   Breast cancer Cousin        paternal; dx 39s    Social History   Socioeconomic History   Marital status: Married    Spouse name: Not on file   Number of children: 2   Years of education: Not on file   Highest education level: Not on file  Occupational History   Not on file  Tobacco Use   Smoking status: Never   Smokeless tobacco: Never  Vaping Use   Vaping Use: Never used  Substance and Sexual Activity   Alcohol use: Never   Drug use: Never   Sexual activity: Yes    Partners: Male  Other Topics Concern   Not on file  Social History Narrative   Not on file   Social Determinants of Health   Financial Resource Strain: Not on file  Food Insecurity: No Food Insecurity   Worried About Running Out of Food in the Last Year: Never true   [place] in the Last Year: Never true  Transportation Needs: No Transportation Needs   Lack of Transportation (Medical): No   Lack of Transportation (Non-Medical): No  Physical Activity: Not on file  Stress: Not on file  Social Connections: Not on file  Intimate Partner Violence: Not on file    Outpatient Medications Prior to Visit  Medication Sig Dispense Refill   acetaminophen (TYLENOL) 500 MG tablet Take 1,000 mg by mouth every 6 (six) hours as needed for moderate pain.     Calcium-Vitamin D-Vitamin K 500-100-40 MG-UNT-MCG CHEW Chew 2 tablets by mouth daily.     cycloSPORINE (RESTASIS) 0.05 % ophthalmic emulsion Place 1 drop into both eyes 2 (two) times daily as needed (dry eyes).     denosumab (PROLIA) 60 MG/ML SOSY injection Inject 60 mg into the skin every 6 (six) months.     diclofenac sodium (VOLTAREN) 1 % GEL Apply 4 g topically 4 (four) times daily. (Patient taking differently: Apply 2 g topically 4 (four) times daily  as needed (pain).) 100 g 11   montelukast (SINGULAIR) 10 MG tablet Take 1 tablet (10 mg total) by mouth at bedtime. (Patient taking differently: Take 10 mg by mouth at bedtime as needed (allergies).) 90 tablet 1   Multiple Vitamins-Minerals (THRIVE FOR LIFE WOMENS PO) Take 1 tablet by mouth every morning. Also Thrive patch and Thrive milkshake daily     NIFEdipine (PROCARDIA-XL/NIFEDICAL-XL) 30 MG 24 hr tablet Take 1 tablet by mouth once daily (Patient taking differently: Take 30 mg by mouth daily.) 90 tablet 2  Probiotic Product (PROBIOTIC PO) Take 1 capsule by mouth daily.     vitamin B-12 (CYANOCOBALAMIN) 500 MCG tablet Take 500 mcg by mouth daily.      celecoxib (CELEBREX) 200 MG capsule One to 2 tablets by mouth daily as needed for pain. (Patient taking differently: Take 400 mg by mouth daily.) 180 capsule 3   cetirizine (ZYRTEC) 10 MG tablet Take 10 mg by mouth daily as needed for allergies.     escitalopram (LEXAPRO) 5 MG tablet Take 5 mg by mouth daily.     esomeprazole (NEXIUM) 40 MG capsule Take 1 capsule by mouth once daily 90 capsule 0   hydrochlorothiazide (HYDRODIURIL) 25 MG tablet TAKE 1 TABLET BY MOUTH ONCE DAILY AS NEEDED SWELLING 90 tablet 0   levothyroxine (SYNTHROID) 75 MCG tablet Take 1 tablet (75 mcg total) by mouth daily before breakfast. Take on Days 1-6 and skip Day 7- NEED ANNUAL EXAM AND LABS (Patient taking differently: Take 75 mcg by mouth daily before breakfast.) 90 tablet 2   nitroGLYCERIN (NITROSTAT) 0.4 MG SL tablet Place 1 tablet (0.4 mg total) under the tongue every 5 (five) minutes as needed for chest pain. 20 tablet 1   SUMAtriptan (IMITREX) 100 MG tablet Take 0.5-1 tablets (50-100 mg total) by mouth every 2 (two) hours as needed for migraine (Max 2 pills per 24 hours). 10 tablet 3   rosuvastatin (CRESTOR) 20 MG tablet Take 1 tablet (20 mg total) by mouth daily. 90 tablet 3   No facility-administered medications prior to visit.    Allergies  Allergen  Reactions   Sulfamethoxazole-Trimethoprim Rash   Tetracyclines & Related Rash    Review of Symptoms: All review of systems negative except what is listed in the HPI     Objective:    Physical Exam Vitals reviewed.  Constitutional:      Appearance: Normal appearance. She is normal weight.  HENT:     Head: Normocephalic and atraumatic.     Right Ear: A middle ear effusion is present. Tympanic membrane is not injected, erythematous or bulging.     Left Ear: Tympanic membrane normal.     Nose: Nose normal.  Eyes:     Conjunctiva/sclera: Conjunctivae normal.  Cardiovascular:     Rate and Rhythm: Normal rate and regular rhythm.  Pulmonary:     Effort: Pulmonary effort is normal.     Breath sounds: Normal breath sounds.  Musculoskeletal:        General: Normal range of motion.     Cervical back: Neck supple. No tenderness.  Lymphadenopathy:     Cervical: No cervical adenopathy.  Skin:    General: Skin is warm and dry.     Findings: No rash.  Neurological:     Mental Status: She is alert and oriented to person, place, and time.  Psychiatric:        Mood and Affect: Mood normal.        Behavior: Behavior normal.        Thought Content: Thought content normal.        Judgment: Judgment normal.    BP 126/81 (BP Location: Left Arm, Patient Position: Sitting, Cuff Size: Normal)   Pulse 81   Temp 98.7 F (37.1 C) (Oral)   Wt 154 lb 1.9 oz (69.9 kg)   BMI 26.45 kg/m  Wt Readings from Last 3 Encounters:  06/12/21 154 lb 1.9 oz (69.9 kg)  05/17/21 152 lb (68.9 kg)  03/30/21 153 lb 8 oz (69.6 kg)  Health Maintenance Due  Topic Date Due   Zoster Vaccines- Shingrix (1 of 2) Never done   COVID-19 Vaccine (3 - Pfizer risk series) 08/23/2020   INFLUENZA VACCINE  Never done    There are no preventive care reminders to display for this patient.   Lab Results  Component Value Date   TSH 2.356 11/07/2020   Lab Results  Component Value Date   WBC 4.4 12/19/2020   HGB  12.1 12/19/2020   HCT 36.2 12/19/2020   MCV 95.8 12/19/2020   PLT 195 12/19/2020   Lab Results  Component Value Date   NA 142 12/19/2020   K 4.3 12/19/2020   CO2 25 12/19/2020   GLUCOSE 103 (H) 12/19/2020   BUN 23 12/19/2020   CREATININE 0.93 12/19/2020   BILITOT 0.6 12/19/2020   ALKPHOS 76 12/19/2020   AST 20 12/19/2020   ALT 11 12/19/2020   PROT 6.2 (L) 12/19/2020   ALBUMIN 3.7 12/19/2020   CALCIUM 8.9 12/19/2020   ANIONGAP 10 12/19/2020   Lab Results  Component Value Date   CHOL 241 (H) 02/22/2019   Lab Results  Component Value Date   HDL 61 02/22/2019   Lab Results  Component Value Date   LDLCALC 153 (H) 02/22/2019   Lab Results  Component Value Date   TRIG 147 02/22/2019   Lab Results  Component Value Date   CHOLHDL 4.0 02/22/2019   No results found for: HGBA1C     Assessment & Plan:    1. Primary osteoarthritis of both hands No concerns today. Refills placed.  - celecoxib (CELEBREX) 200 MG capsule; One to 2 tablets by mouth daily as needed for pain.  Dispense: 180 capsule; Refill: 3  2. Hypothyroidism, unspecified type Doing well overall. TSH checked today. Refills placed - levothyroxine (SYNTHROID) 75 MCG tablet; Take 1 tablet (75 mcg total) by mouth daily before breakfast.  Dispense: 90 tablet; Refill: 0 - TSH  3. Coronary artery disease involving native coronary artery of native heart without angina pectoris Doing well overall. No concerns today. Refills placed. - nitroGLYCERIN (NITROSTAT) 0.4 MG SL tablet; Place 1 tablet (0.4 mg total) under the tongue every 5 (five) minutes as needed for chest pain.  Dispense: 20 tablet; Refill: 1 - rosuvastatin (CRESTOR) 20 MG tablet; Take 1 tablet (20 mg total) by mouth daily.  Dispense: 90 tablet; Refill: 1  4. Primary hypertension Blood pressure is at goal at for age and co-morbidities.  I recommend continuing current regimen.  In addition they were instructed on the following: - BP goal <130/80 -  monitor and log blood pressures at home - check around the same time each day in a relaxed setting - Limit salt to <2000 mg/day - Follow DASH eating plan (heart healthy diet) - limit alcohol to 2 standard drinks per day for men and 1 per day for women - avoid tobacco products - get at least 2 hours of regular aerobic exercise weekly Patient aware of signs/symptoms requiring further/urgent evaluation. Labs updated today. - hydrochlorothiazide (HYDRODIURIL) 25 MG tablet; TAKE 1 TABLET BY MOUTH ONCE DAILY AS NEEDED SWELLING  Dispense: 90 tablet; Refill: 0 - Comprehensive metabolic panel  5. Migraine without status migrainosus, not intractable, unspecified migraine type Doing well overall. Refills placed.  - SUMAtriptan (IMITREX) 100 MG tablet; Take 0.5-1 tablets (50-100 mg total) by mouth every 2 (two) hours as needed for migraine (Max 2 pills per 24 hours).  Dispense: 10 tablet; Refill: 3  6. Allergic rhinitis, unspecified seasonality,  unspecified trigger 9. Eustachian tube dysfunction, right Ear pressure/discomfort likely related to allergic rhinitis and seasonal change - no signs of infection today. Recommend continue Zyrtec, start flonase 1-2x/daily and use occasional Sudafed (caution with BP). If not starting to improve in the next week or so, can consider prednisone burst.  - cetirizine (ZYRTEC) 10 MG tablet; Take 1 tablet (10 mg total) by mouth daily as needed for allergies.  Dispense: 90 tablet; Refill: 0 - fluticasone (FLONASE) 50 MCG/ACT nasal spray; Place 2 sprays into both nostrils daily.  Dispense: 1 g; Refill: 2  7. Anxiety in acute stress reaction Doing well overall. Refills placed.  - escitalopram (LEXAPRO) 5 MG tablet; Take 1 tablet (5 mg total) by mouth daily.  Dispense: 30 tablet; Refill: 2  8. Gastroesophageal reflux disease without esophagitis Doing well overall. Refills placed.  - esomeprazole (NEXIUM) 40 MG capsule; Take 1 capsule (40 mg total) by mouth daily.   Dispense: 90 capsule; Refill: 0   Patient aware of signs/symptoms requiring further/urgent evaluation.  Follow-up in 6 months for routine care and establish with new PCP or sooner if needed.    Terrilyn Saver, NP

## 2021-06-12 NOTE — Patient Instructions (Addendum)
For ear pain/pressure: Take Flonase nose spray twice daily Continue Mucinex Occasional Sudafed (be cautious of raising your blood pressure) If still bothering you in a week or so, we can try prednisone

## 2021-06-13 NOTE — Addendum Note (Signed)
Addended by: Caleen Jobs B on: 06/13/2021 11:33 AM   Modules accepted: Orders

## 2021-06-14 LAB — COMPREHENSIVE METABOLIC PANEL
AG Ratio: 1.8 (calc) (ref 1.0–2.5)
ALT: 14 U/L (ref 6–29)
AST: 21 U/L (ref 10–35)
Albumin: 4.1 g/dL (ref 3.6–5.1)
Alkaline phosphatase (APISO): 65 U/L (ref 37–153)
BUN: 13 mg/dL (ref 7–25)
CO2: 27 mmol/L (ref 20–32)
Calcium: 8.7 mg/dL (ref 8.6–10.4)
Chloride: 107 mmol/L (ref 98–110)
Creat: 0.86 mg/dL (ref 0.60–1.00)
Globulin: 2.3 g/dL (calc) (ref 1.9–3.7)
Glucose, Bld: 93 mg/dL (ref 65–99)
Potassium: 4.3 mmol/L (ref 3.5–5.3)
Sodium: 142 mmol/L (ref 135–146)
Total Bilirubin: 0.7 mg/dL (ref 0.2–1.2)
Total Protein: 6.4 g/dL (ref 6.1–8.1)

## 2021-06-14 LAB — T4, FREE: Free T4: 1.1 ng/dL (ref 0.8–1.8)

## 2021-06-14 LAB — TSH: TSH: 4.83 mIU/L — ABNORMAL HIGH (ref 0.40–4.50)

## 2021-06-18 ENCOUNTER — Ambulatory Visit: Payer: Self-pay

## 2021-07-02 ENCOUNTER — Other Ambulatory Visit: Payer: Self-pay

## 2021-07-02 ENCOUNTER — Ambulatory Visit: Payer: Medicare Other | Attending: Radiation Oncology

## 2021-07-02 VITALS — Wt 154.2 lb

## 2021-07-02 DIAGNOSIS — Z483 Aftercare following surgery for neoplasm: Secondary | ICD-10-CM | POA: Insufficient documentation

## 2021-07-02 NOTE — Therapy (Signed)
[facility] @ [place], [place], [address] Phone: [phone]   Fax:  [phone]  Physical Therapy Treatment  Patient Details  Name: Cindy [name] MRN: [id number] Date of Birth: 1945/10/30 Referring Provider (PT): Isidore Moos   Encounter Date: 07/02/2021   PT End of Session - 07/02/21 0857     Visit Number 6   # unchanged due to screen only   PT Start Time 2878    PT Stop Time 0859    PT Time Calculation (min) 5 min    Activity Tolerance Patient tolerated treatment well    Behavior During Therapy [facility] for tasks assessed/performed             Past Medical History:  Diagnosis Date   Abnormal mammogram    Allergic rhinitis 10/30/2018   Anxiety in acute stress reaction 10/30/2018   Arthritis    Atypical chest pain 10/30/2018   Cancer [facility])    breast    Chest pain    "not cardiac related"   Colon polyps    Dyspnea    DOE   Family history of breast cancer 07/05/2020   Family history of pancreatic cancer 07/05/2020   Family history of prostate cancer 07/05/2020   GERD (gastroesophageal reflux disease) 10/30/2018   History of kidney stones    passed   Hypertension 10/30/2018   Hyperthyroidism    Hypothyroidism 10/30/2018   Migraine 10/30/2018   Osteoporosis 10/30/2018   Personal history of chemotherapy    Personal history of radiation therapy    Pneumonia    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   Vitamin D deficiency 10/30/2018    Past Surgical History:  Procedure Laterality Date   BREAST BIOPSY Left 06/26/2020   x2   BREAST LUMPECTOMY Left 08/15/2020   BREAST LUMPECTOMY WITH RADIOACTIVE SEED AND SENTINEL LYMPH NODE BIOPSY Left 08/15/2020   Procedure: LEFT BREAST LUMPECTOMY WITH RADIOACTIVE SEED AND SENTINEL LYMPH NODE BIOPSY;  Surgeon: Stark Klein, MD;  Location: [facility];  Service: General;  Laterality: Left;  RNFA   COLONOSCOPY W/ POLYPECTOMY     ESOPHAGEAL DILATION     EYE  SURGERY Bilateral    catheter with lens   HYSTERECTOMY ABDOMINAL WITH SALPINGECTOMY     PORT-A-CATH REMOVAL  01/04/2021   Procedure: REMOVAL PORT-A-CATH;  Surgeon: Stark Klein, MD;  Location: [facility];  Service: General;;   PORTACATH PLACEMENT Right 08/15/2020   Procedure: INSERTION PORT-A-CATH;  Surgeon: Stark Klein, MD;  Location: [facility];  Service: General;  Laterality: Right;   TONSILLECTOMY     TUBAL LIGATION      Vitals:   07/02/21 0855  Weight: 154 lb 4 oz (70 kg)     Subjective Assessment - 07/02/21 0855     Subjective Pt returns for her 3 month L-Dex screen. The fullness in my Lt breast has seemed more persistent since completing radiation.    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 She had left lumpectomy on 08/15/2020 wth 2 negative nodes removed. Ki67 is 80%. Radiation complete                    L-DEX FLOWSHEETS - 07/02/21 0800       L-DEX LYMPHEDEMA SCREENING   Measurement Type Unilateral    L-DEX MEASUREMENT EXTREMITY Upper Extremity    POSITION  Standing    DOMINANT SIDE Right    At Risk  Side Left    BASELINE SCORE (UNILATERAL) 3.9                                     PT Long Term Goals - 03/13/21 0806       PT LONG TERM GOAL #1   Title Pt will be independent in self MLD for long term management of lymphedema.    Baseline 03/13/21- pt feels independent with self MLD.    Time 5    Period Weeks    Status Achieved      PT LONG TERM GOAL #2   Title Pt will report a 50% improvement in L breast swelling to decrease risk of infection.    Baseline 03/13/21- 80% improvement    Time 5    Period Weeks    Status Achieved                   Plan - 07/02/21 0859     Clinical Impression Statement Pt returns for her 3 month L-Dex screen. Her change from baseline of 0.9 is WNLs. Pt does report that her breast is  continuing to feel swollen since completing radiation. Upon asking questions pt reports that she has stopped wearing her compression bra and isn't doing her self MLD. So encouraged her to resume wear of compression bra 24/7 and perform self MLD 2x/day for next week to see if this will help relieve some of her breast fullness. If so also encouraged her to consider returning for physical therapy so we can make sure she is performing her MLD correctly and her we can resume treatment to help her with her post radiation recovery. Pt verbalized understanding and seemed encouraged by this information and plan.    PT Next Visit Plan Continue with L-Dex every 3 months for up to 2 years from her SLNB (~08/15/22); may return for breast lymphedema treatment    Consulted and Agree with Plan of Care Patient             Patient will benefit from skilled therapeutic intervention in order to improve the following deficits and impairments:     Visit Diagnosis: Aftercare following surgery for neoplasm     Problem List Patient Active Problem List   Diagnosis Date Noted   Port-A-Cath in place 09/12/2020   Genetic testing 07/11/2020   Family history of breast cancer 07/05/2020   Family history of prostate cancer 07/05/2020   Family history of pancreatic cancer 07/05/2020   Malignant neoplasm of upper-outer quadrant of left breast in female, estrogen receptor negative ([place]) 06/30/2020   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/23/2019   Primary osteoarthritis of both hands 06/23/2019   Trigger thumb of left hand 06/23/2019   Polyarthralgia 06/23/2019   Hypertension 10/30/2018   Atypical chest pain 10/30/2018   Migraine 10/30/2018   Hypothyroidism 10/30/2018   Osteoporosis 10/30/2018   GERD (gastroesophageal reflux disease) 10/30/2018   Anxiety in acute stress reaction 10/30/2018   Allergic rhinitis 10/30/2018   Vitamin D deficiency 10/30/2018   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     Cindy [name], PTA 07/02/2021, 9:07 AM  [facility] @ [place], [place], [address] Phone: [phone]   Fax:  [phone]  Name: Cindy [name] MRN: [id number] Date of Birth: Jan 26, 1946

## 2021-07-03 ENCOUNTER — Ambulatory Visit (INDEPENDENT_AMBULATORY_CARE_PROVIDER_SITE_OTHER): Payer: Medicare Other

## 2021-07-03 ENCOUNTER — Ambulatory Visit (INDEPENDENT_AMBULATORY_CARE_PROVIDER_SITE_OTHER): Payer: Medicare Other | Admitting: Sports Medicine

## 2021-07-03 DIAGNOSIS — M170 Bilateral primary osteoarthritis of knee: Secondary | ICD-10-CM

## 2021-07-03 DIAGNOSIS — M1712 Unilateral primary osteoarthritis, left knee: Secondary | ICD-10-CM | POA: Diagnosis not present

## 2021-07-03 DIAGNOSIS — I2510 Atherosclerotic heart disease of native coronary artery without angina pectoris: Secondary | ICD-10-CM

## 2021-07-03 DIAGNOSIS — M1711 Unilateral primary osteoarthritis, right knee: Secondary | ICD-10-CM | POA: Diagnosis not present

## 2021-07-03 NOTE — Progress Notes (Signed)
    Procedures performed today:    Procedure: Real-time Ultrasound Guided injection of the right knee Device: Samsung HS60  Verbal informed consent obtained.  Time-out conducted.  Noted no overlying erythema, induration, or other signs of local infection.  Skin prepped in a sterile fashion.  Local anesthesia: Topical Ethyl chloride.  With sterile technique and under real time ultrasound guidance: Noted trace effusion, 1 cc Kenalog 40, 2 cc lidocaine, 2 cc bupivacaine injected easily Completed without difficulty  Advised to call if fevers/chills, erythema, induration, drainage, or persistent bleeding.  Images permanently stored and available for review in PACS.  Impression: Technically successful ultrasound guided injection.  Independent interpretation of notes and tests performed by another provider:   None.  Brief History, Exam, Impression, and Recommendations:    Primary osteoarthritis of both knees This pleasant 75 year old female is a bit early, right knee osteoarthritis, last injected in August. Repeat injection today. I would like some updated x-rays. Return as needed.  Chronic process with exacerbation and pharmacologic intervention  ___________________________________________ Gwen Her. Dianah Field, M.D., ABFM., CAQSM. Primary Care and [place] Instructor of [employer] [facility] [employer]

## 2021-07-03 NOTE — Assessment & Plan Note (Signed)
This pleasant 75 year old female is a bit early, right knee osteoarthritis, last injected in August. Repeat injection today. I would like some updated x-rays. Return as needed.

## 2021-07-13 DIAGNOSIS — R300 Dysuria: Secondary | ICD-10-CM | POA: Diagnosis not present

## 2021-07-13 DIAGNOSIS — N3001 Acute cystitis with hematuria: Secondary | ICD-10-CM | POA: Diagnosis not present

## 2021-07-13 DIAGNOSIS — N3080 Other cystitis without hematuria: Secondary | ICD-10-CM | POA: Diagnosis not present

## 2021-08-01 NOTE — Progress Notes (Signed)
HPI: Follow-up chest pain. Patient previously resided in [place] and moved here in 2019 to be close to family.  She has had occasional chest pain intermittently for approximately 20 years.  Coronary CTA September 2021 showed calcium score 0, no coronary disease and spotty aortic atherosclerosis.  Since last seen she continues to have occasional chest pain.  It starts in her right ear and radiates to her chest.  There is associated dyspnea but no nausea or diaphoresis.  It can last anywhere from 2 minutes to 10 minutes.  Resolve spontaneously.  It is not exertional.  She denies exertional chest pain, dyspnea on exertion or syncope.  Current Outpatient Medications  Medication Sig Dispense Refill   acetaminophen (TYLENOL) 500 MG tablet Take 1,000 mg by mouth every 6 (six) hours as needed for moderate pain.     Calcium-Vitamin D-Vitamin K 500-100-40 MG-UNT-MCG CHEW Chew 2 tablets by mouth daily.     celecoxib (CELEBREX) 200 MG capsule One to 2 tablets by mouth daily as needed for pain. 180 capsule 3   cetirizine (ZYRTEC) 10 MG tablet Take 1 tablet (10 mg total) by mouth daily as needed for allergies. 90 tablet 0   denosumab (PROLIA) 60 MG/ML SOSY injection Inject 60 mg into the skin every 6 (six) months.     diclofenac sodium (VOLTAREN) 1 % GEL Apply 4 g topically 4 (four) times daily. (Patient taking differently: Apply 2 g topically 4 (four) times daily as needed (pain).) 100 g 11   escitalopram (LEXAPRO) 5 MG tablet Take 1 tablet (5 mg total) by mouth daily. 30 tablet 2   esomeprazole (NEXIUM) 40 MG capsule Take 1 capsule (40 mg total) by mouth daily. 90 capsule 0   fluticasone (FLONASE) 50 MCG/ACT nasal spray Place 2 sprays into both nostrils daily. 1 g 2   hydrochlorothiazide (HYDRODIURIL) 25 MG tablet TAKE 1 TABLET BY MOUTH ONCE DAILY AS NEEDED SWELLING 90 tablet 0   levothyroxine (SYNTHROID) 75 MCG tablet Take 1 tablet (75 mcg total) by mouth daily before breakfast. 90 tablet 0    NIFEdipine (PROCARDIA-XL/NIFEDICAL-XL) 30 MG 24 hr tablet Take 1 tablet by mouth once daily (Patient taking differently: Take 30 mg by mouth daily.) 90 tablet 2   nitroGLYCERIN (NITROSTAT) 0.4 MG SL tablet Place 1 tablet (0.4 mg total) under the tongue every 5 (five) minutes as needed for chest pain. 20 tablet 1   Probiotic Product (PROBIOTIC PO) Take 1 capsule by mouth daily.     rosuvastatin (CRESTOR) 20 MG tablet Take 1 tablet (20 mg total) by mouth daily. 90 tablet 1   SUMAtriptan (IMITREX) 100 MG tablet Take 0.5-1 tablets (50-100 mg total) by mouth every 2 (two) hours as needed for migraine (Max 2 pills per 24 hours). 10 tablet 3   vitamin B-12 (CYANOCOBALAMIN) 500 MCG tablet Take 500 mcg by mouth daily.      cycloSPORINE (RESTASIS) 0.05 % ophthalmic emulsion Place 1 drop into both eyes 2 (two) times daily as needed (dry eyes).     montelukast (SINGULAIR) 10 MG tablet Take 1 tablet (10 mg total) by mouth at bedtime. (Patient taking differently: Take 10 mg by mouth at bedtime as needed (allergies).) 90 tablet 1   No current facility-administered medications for this visit.     Past Medical History:  Diagnosis Date   Abnormal mammogram    Allergic rhinitis 10/30/2018   Anxiety in acute stress reaction 10/30/2018   Arthritis    Atypical chest pain 10/30/2018  Cancer [facility])    breast    Chest pain    "not cardiac related"   Colon polyps    Dyspnea    DOE   Family history of breast cancer 07/05/2020   Family history of pancreatic cancer 07/05/2020   Family history of prostate cancer 07/05/2020   GERD (gastroesophageal reflux disease) 10/30/2018   History of kidney stones    passed   Hypertension 10/30/2018   Hyperthyroidism    Hypothyroidism 10/30/2018   Migraine 10/30/2018   Osteoporosis 10/30/2018   Personal history of chemotherapy    Personal history of radiation therapy    Pneumonia    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   Vitamin D deficiency 10/30/2018     Past Surgical History:  Procedure Laterality Date   BREAST BIOPSY Left 06/26/2020   x2   BREAST LUMPECTOMY Left 08/15/2020   BREAST LUMPECTOMY WITH RADIOACTIVE SEED AND SENTINEL LYMPH NODE BIOPSY Left 08/15/2020   Procedure: LEFT BREAST LUMPECTOMY WITH RADIOACTIVE SEED AND SENTINEL LYMPH NODE BIOPSY;  Surgeon: Stark Klein, MD;  Location: [place];  Service: General;  Laterality: Left;  RNFA   COLONOSCOPY W/ POLYPECTOMY     ESOPHAGEAL DILATION     EYE SURGERY Bilateral    catheter with lens   HYSTERECTOMY ABDOMINAL WITH SALPINGECTOMY     PORT-A-CATH REMOVAL  01/04/2021   Procedure: REMOVAL PORT-A-CATH;  Surgeon: Stark Klein, MD;  Location: Home;  Service: General;;   PORTACATH PLACEMENT Right 08/15/2020   Procedure: INSERTION PORT-A-CATH;  Surgeon: Stark Klein, MD;  Location: [facility];  Service: General;  Laterality: Right;   TONSILLECTOMY     TUBAL LIGATION      Social History   Socioeconomic History   Marital status: Married    Spouse name: Not on file   Number of children: 2   Years of education: Not on file   Highest education level: Not on file  Occupational History   Not on file  Tobacco Use   Smoking status: Never   Smokeless tobacco: Never  Vaping Use   Vaping Use: Never used  Substance and Sexual Activity   Alcohol use: Never   Drug use: Never   Sexual activity: Yes    Partners: Male  Other Topics Concern   Not on file  Social History Narrative   Not on file   Social Determinants of Health   Financial Resource Strain: Not on file  Food Insecurity: Not on file  Transportation Needs: Not on file  Physical Activity: Not on file  Stress: Not on file  Social Connections: Not on file  Intimate Partner Violence: Not on file    Family History  Problem Relation Age of Onset   High blood pressure Mother    Breast cancer Mother        dx before 45   Alzheimer's disease Mother    Heart attack Father    Prostate cancer Brother 40    Breast cancer Maternal Aunt        dx 60s   Breast cancer Maternal Aunt        dx 43s   Breast cancer Maternal Grandmother        dx after 75   Leukemia Paternal Grandfather        dx 31s-80s   Pancreatic cancer Cousin        maternal; dx 79s   Cancer Maternal Aunt 50       unknown type   Breast cancer Cousin  paternal; dx 43s    ROS: no fevers or chills, productive cough, hemoptysis, dysphasia, odynophagia, melena, hematochezia, dysuria, hematuria, rash, seizure activity, orthopnea, PND, pedal edema, claudication. Remaining systems are negative.  Physical Exam: Well-developed well-nourished in no acute distress.  Skin is warm and dry.  HEENT is normal.  Neck is supple.  Chest is clear to auscultation with normal expansion.  Cardiovascular exam is regular rate and rhythm.  Abdominal exam nontender or distended. No masses palpated. Extremities show no edema. neuro grossly intact  ECG-normal sinus rhythm at a rate of 75, normal axis, low voltage; personally reviewed  A/P  1 chest pain-symptoms are atypical and longstanding.  Recent chest CT showed no coronary disease.  We will not pursue further ischemia evaluation at this point.  2 hypertension-patient's blood pressure is controlled today.  Continue present medications and follow.  3 hyperlipidemia-continue statin.  Kirk Ruths, MD

## 2021-08-13 ENCOUNTER — Encounter: Payer: Self-pay | Admitting: Cardiology

## 2021-08-13 ENCOUNTER — Other Ambulatory Visit: Payer: Self-pay

## 2021-08-13 ENCOUNTER — Ambulatory Visit (INDEPENDENT_AMBULATORY_CARE_PROVIDER_SITE_OTHER): Payer: Medicare Other | Admitting: Cardiology

## 2021-08-13 VITALS — BP 123/86 | HR 75 | Ht 64.0 in | Wt 152.0 lb

## 2021-08-13 DIAGNOSIS — E7800 Pure hypercholesterolemia, unspecified: Secondary | ICD-10-CM

## 2021-08-13 DIAGNOSIS — R072 Precordial pain: Secondary | ICD-10-CM

## 2021-08-13 DIAGNOSIS — I2510 Atherosclerotic heart disease of native coronary artery without angina pectoris: Secondary | ICD-10-CM | POA: Diagnosis not present

## 2021-08-13 DIAGNOSIS — I10 Essential (primary) hypertension: Secondary | ICD-10-CM

## 2021-08-13 NOTE — Patient Instructions (Signed)
  Follow-Up: At [facility], you and your health needs are our priority.  As part of our continuing mission to provide you with exceptional heart care, we have created designated Provider Care Teams.  These Care Teams include your primary Cardiologist (physician) and Advanced Practice Providers (APPs -  Physician Assistants and Nurse Practitioners) who all work together to provide you with the care you need, when you need it.  We recommend signing up for the patient portal called "MyChart".  Sign up information is provided on this After Visit Summary.  MyChart is used to connect with patients for Virtual Visits (Telemedicine).  Patients are able to view lab/test results, encounter notes, upcoming appointments, etc.  Non-urgent messages can be sent to your provider as well.   To learn more about what you can do with MyChart, go to [email or web address].    Your next appointment:   12 month(s)  The format for your next appointment:   In Person  Provider:   Kirk Ruths MD

## 2021-08-19 NOTE — Assessment & Plan Note (Signed)
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 We will set her up for every 36-monthProlia injections.  I will see her back in 1 year.

## 2021-08-19 NOTE — Progress Notes (Signed)
Patient Care Team: Emeterio Reeve, DO as PCP - General (Osteopathic Medicine) Stark Klein, MD as Consulting Physician (General Surgery) Nicholas Lose, MD as Consulting Physician (Hematology and Oncology) Eppie Gibson, MD as Attending Physician (Radiation Oncology)  DIAGNOSIS:    ICD-10-CM   1. Malignant neoplasm of upper-outer quadrant of left breast in female, estrogen receptor negative ([place])  C50.412    Z17.1       SUMMARY OF ONCOLOGIC HISTORY: Oncology History  Malignant neoplasm of upper-outer quadrant of left breast in female, estrogen receptor negative ([place])  06/30/2020 Initial Diagnosis   Screening mammogram showed a possible left breast mass. Diagnostic mammogram showed two adjacent masses at the 1 o'clock position, 0.7cm and 0.8cm, about 0.5cm apart, and spanning in total 1.8cm, no left axillary adenopathy. Biopsy showed IDC with squamous differentiation and DCIS, grade 3, HER-2 negative (1+), ER/PR negative, Ki67 80%.    07/05/2020 Cancer Staging   Staging form: Breast, AJCC 8th Edition - Clinical stage from 07/05/2020: Stage IB (cT1c, cN0, cM0, G3, ER-, PR-, HER2-) - Signed by Nicholas Lose, MD on 07/05/2020    07/11/2020 Genetic Testing   Negative genetic testing: no mutations detected in [facility] Common Hereditary Cancers Panel.  The report date is July 11, 2020.   The Common Hereditary Cancers Panel offered by [facility] includes sequencing and/or deletion duplication testing of the following 48 genes: APC, ATM, AXIN2, BARD1, BMPR1A, BRCA1, BRCA2, BRIP1, CDH1, CDK4, CDKN2A (p14ARF), CDKN2A (p16INK4a), CHEK2, CTNNA1, DICER1, EPCAM (Deletion/duplication testing only), GREM1 (promoter region deletion/duplication testing only), KIT, MEN1, MLH1, MSH2, MSH3, MSH6, MUTYH, NBN, NF1, NHTL1, PALB2, PDGFRA, PMS2, POLD1, POLE, PTEN, RAD50, RAD51C, RAD51D, RNF43, SDHB, SDHC, SDHD, SMAD4, SMARCA4. STK11, TP53, TSC1, TSC2, and VHL.  The following genes were evaluated for sequence  changes only: SDHA and HOXB13 c.251G>A variant only.   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   08/15/2020 Cancer Staging   Staging form: Breast, AJCC 8th Edition - Pathologic stage from 08/15/2020: Stage IB (pT1c, pN0, cM0, G3, ER-, PR-, HER2-) - Signed by Gardenia Phlegm, NP on 05/17/2021 Stage prefix: Initial diagnosis Histologic grading system: 3 grade system    09/05/2020 - 12/21/2020 Chemotherapy   CMF x 6 cycles        01/18/2021 - 02/16/2021 Radiation Therapy   Adjuvant radiation therapy with Dr. Isidore Moos     CHIEF COMPLIANT: Follow-up of left breast cancer   INTERVAL HISTORY: Cindy [name] is a 75 y.o. with above-mentioned history of left breast cancer who underwent a left lumpectomy, adjuvant chemotherapy, and is currently on radiation. She presents to the clinic today for follow-up.  She has slight swelling and discomfort in the left breast but otherwise doing well.  ALLERGIES:  is allergic to sulfamethoxazole-trimethoprim and tetracyclines & related.  MEDICATIONS:  Current Outpatient Medications  Medication Sig Dispense Refill   acetaminophen (TYLENOL) 500 MG tablet Take 1,000 mg by mouth every 6 (six) hours as needed for moderate pain.     Calcium-Vitamin D-Vitamin K 500-100-40 MG-UNT-MCG CHEW Chew 2 tablets by mouth daily.     celecoxib (CELEBREX) 200 MG capsule One to 2 tablets by mouth daily as needed for pain. 180 capsule 3   cetirizine (ZYRTEC) 10 MG tablet Take 1 tablet (10 mg total) by mouth daily as needed for allergies. 90 tablet 0   cycloSPORINE (RESTASIS) 0.05 % ophthalmic emulsion Place 1 drop into  both eyes 2 (two) times daily as needed (dry eyes).     denosumab (PROLIA) 60 MG/ML SOSY injection Inject 60 mg into the skin every 6 (six) months.     diclofenac sodium (VOLTAREN) 1 % GEL Apply 4 g  topically 4 (four) times daily. (Patient taking differently: Apply 2 g topically 4 (four) times daily as needed (pain).) 100 g 11   escitalopram (LEXAPRO) 5 MG tablet Take 1 tablet (5 mg total) by mouth daily. 30 tablet 2   esomeprazole (NEXIUM) 40 MG capsule Take 1 capsule (40 mg total) by mouth daily. 90 capsule 0   fluticasone (FLONASE) 50 MCG/ACT nasal spray Place 2 sprays into both nostrils daily. 1 g 2   hydrochlorothiazide (HYDRODIURIL) 25 MG tablet TAKE 1 TABLET BY MOUTH ONCE DAILY AS NEEDED SWELLING 90 tablet 0   levothyroxine (SYNTHROID) 75 MCG tablet Take 1 tablet (75 mcg total) by mouth daily before breakfast. 90 tablet 0   montelukast (SINGULAIR) 10 MG tablet Take 1 tablet (10 mg total) by mouth at bedtime. (Patient taking differently: Take 10 mg by mouth at bedtime as needed (allergies).) 90 tablet 1   NIFEdipine (PROCARDIA-XL/NIFEDICAL-XL) 30 MG 24 hr tablet Take 1 tablet by mouth once daily (Patient taking differently: Take 30 mg by mouth daily.) 90 tablet 2   nitroGLYCERIN (NITROSTAT) 0.4 MG SL tablet Place 1 tablet (0.4 mg total) under the tongue every 5 (five) minutes as needed for chest pain. 20 tablet 1   Probiotic Product (PROBIOTIC PO) Take 1 capsule by mouth daily.     rosuvastatin (CRESTOR) 20 MG tablet Take 1 tablet (20 mg total) by mouth daily. 90 tablet 1   SUMAtriptan (IMITREX) 100 MG tablet Take 0.5-1 tablets (50-100 mg total) by mouth every 2 (two) hours as needed for migraine (Max 2 pills per 24 hours). 10 tablet 3   vitamin B-12 (CYANOCOBALAMIN) 500 MCG tablet Take 500 mcg by mouth daily.      No current facility-administered medications for this visit.    PHYSICAL EXAMINATION: ECOG PERFORMANCE STATUS: 1 - Symptomatic but completely ambulatory  Vitals:   08/20/21 1015  BP: (!) 149/85  Pulse: 88  Resp: 18  Temp: 97.8 F (36.6 C)  SpO2: 95%   Filed Weights   08/20/21 1015  Weight: 154 lb 3.2 oz (69.9 kg)    BREAST: No palpable masses or nodules in  either right or left breasts. No palpable axillary supraclavicular or infraclavicular adenopathy no breast tenderness or nipple discharge. (exam performed in the presence of a chaperone)  LABORATORY DATA:  I have reviewed the data as listed CMP Latest Ref Rng & Units 06/12/2021 12/19/2020 11/28/2020  Glucose 65 - 99 mg/dL 93 103(H) 140(H)  BUN 7 - 25 mg/dL 13 23 12   Creatinine 0.60 - 1.00 mg/dL 0.86 0.93 0.94  Sodium 135 - 146 mmol/L 142 142 141  Potassium 3.5 - 5.3 mmol/L 4.3 4.3 4.1  Chloride 98 - 110 mmol/L 107 107 109  CO2 20 - 32 mmol/L 27 25 23   Calcium 8.6 - 10.4 mg/dL 8.7 8.9 8.6(L)  Total Protein 6.1 - 8.1 g/dL 6.4 6.2(L) 6.4(L)  Total Bilirubin 0.2 - 1.2 mg/dL 0.7 0.6 0.5  Alkaline Phos 38 - 126 U/L - 76 75  AST 10 - 35 U/L 21 20 24   ALT 6 - 29 U/L 14 11 16     Lab Results  Component Value Date   WBC 4.4 12/19/2020   HGB 12.1 12/19/2020   HCT 36.2 12/19/2020  MCV 95.8 12/19/2020   PLT 195 12/19/2020   NEUTROABS 2.7 12/19/2020    ASSESSMENT & PLAN:  Malignant neoplasm of upper-outer quadrant of left breast in female, estrogen receptor negative ([place]) 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  ER/PR negative, Ki-67 80%, HER-2 negative   Treatment plan: 1. adjuvant chemotherapy with CMF x6 cycles  09/05/2020- 12/19/20 2.  Follow-up adjuvant radiation 01/19/21-02/22/21 ------------------------------------------------------------------------------------------------------------------------------------------------------ RTC in 1 year for followup She received Prolia injection today for osteoporosis. We will set her up for every 74-monthProlia injections.  I will see her back in 1 year.    No orders of the defined types were placed in this encounter.  The patient has a good understanding of the overall plan. she agrees with it. she will call  with any problems that may develop before the next visit here.  Total time spent: 20 mins including face to face time and time spent for planning, charting and coordination of care  VRulon Eisenmenger MD, MPH 08/20/2021  I, KThana Ates am acting as scribe for Dr. VNicholas Lose  I have reviewed the above documentation for accuracy and completeness, and I agree with the above.

## 2021-08-20 ENCOUNTER — Inpatient Hospital Stay: Payer: Medicare Other

## 2021-08-20 ENCOUNTER — Other Ambulatory Visit: Payer: Self-pay | Admitting: Hematology and Oncology

## 2021-08-20 ENCOUNTER — Inpatient Hospital Stay (HOSPITAL_BASED_OUTPATIENT_CLINIC_OR_DEPARTMENT_OTHER): Payer: Medicare Other | Admitting: Hematology and Oncology

## 2021-08-20 ENCOUNTER — Inpatient Hospital Stay: Payer: Medicare Other | Attending: Radiation Oncology

## 2021-08-20 ENCOUNTER — Other Ambulatory Visit: Payer: Self-pay

## 2021-08-20 DIAGNOSIS — C50412 Malignant neoplasm of upper-outer quadrant of left female breast: Secondary | ICD-10-CM | POA: Insufficient documentation

## 2021-08-20 DIAGNOSIS — M810 Age-related osteoporosis without current pathological fracture: Secondary | ICD-10-CM

## 2021-08-20 DIAGNOSIS — Z171 Estrogen receptor negative status [ER-]: Secondary | ICD-10-CM

## 2021-08-20 DIAGNOSIS — Z95828 Presence of other vascular implants and grafts: Secondary | ICD-10-CM

## 2021-08-20 LAB — CBC WITH DIFFERENTIAL (CANCER CENTER ONLY)
Abs Immature Granulocytes: 0.01 10*3/uL (ref 0.00–0.07)
Basophils Absolute: 0 10*3/uL (ref 0.0–0.1)
Basophils Relative: 1 %
Eosinophils Absolute: 0.1 10*3/uL (ref 0.0–0.5)
Eosinophils Relative: 2 %
HCT: 43.5 % (ref 36.0–46.0)
Hemoglobin: 14.1 g/dL (ref 12.0–15.0)
Immature Granulocytes: 0 %
Lymphocytes Relative: 25 %
Lymphs Abs: 1 10*3/uL (ref 0.7–4.0)
MCH: 30.7 pg (ref 26.0–34.0)
MCHC: 32.4 g/dL (ref 30.0–36.0)
MCV: 94.6 fL (ref 80.0–100.0)
Monocytes Absolute: 0.5 10*3/uL (ref 0.1–1.0)
Monocytes Relative: 12 %
Neutro Abs: 2.5 10*3/uL (ref 1.7–7.7)
Neutrophils Relative %: 60 %
Platelet Count: 180 10*3/uL (ref 150–400)
RBC: 4.6 MIL/uL (ref 3.87–5.11)
RDW: 12.5 % (ref 11.5–15.5)
WBC Count: 4.2 10*3/uL (ref 4.0–10.5)
nRBC: 0 % (ref 0.0–0.2)

## 2021-08-20 LAB — CMP (CANCER CENTER ONLY)
ALT: 20 U/L (ref 0–44)
AST: 29 U/L (ref 15–41)
Albumin: 3.5 g/dL (ref 3.5–5.0)
Alkaline Phosphatase: 64 U/L (ref 38–126)
Anion gap: 7 (ref 5–15)
BUN: 14 mg/dL (ref 8–23)
CO2: 30 mmol/L (ref 22–32)
Calcium: 9.3 mg/dL (ref 8.9–10.3)
Chloride: 107 mmol/L (ref 98–111)
Creatinine: 1.06 mg/dL — ABNORMAL HIGH (ref 0.44–1.00)
GFR, Estimated: 54 mL/min — ABNORMAL LOW (ref >60)
Glucose, Bld: 93 mg/dL (ref 70–99)
Potassium: 4.7 mmol/L (ref 3.5–5.1)
Sodium: 144 mmol/L (ref 135–145)
Total Bilirubin: 0.6 mg/dL (ref 0.3–1.2)
Total Protein: 6.3 g/dL — ABNORMAL LOW (ref 6.5–8.1)

## 2021-08-20 MED ORDER — DENOSUMAB 60 MG/ML ~~LOC~~ SOSY
60.0000 mg | PREFILLED_SYRINGE | Freq: Once | SUBCUTANEOUS | Status: AC
  Administered 2021-08-20: 60 mg via SUBCUTANEOUS
  Filled 2021-08-20: qty 1

## 2021-08-21 ENCOUNTER — Telehealth: Payer: Self-pay | Admitting: Hematology and Oncology

## 2021-08-21 NOTE — Telephone Encounter (Signed)
Scheduled appointment per 12/12 los. Patient is aware. 

## 2021-08-22 ENCOUNTER — Telehealth: Payer: Self-pay | Admitting: Hematology and Oncology

## 2021-08-22 NOTE — Telephone Encounter (Signed)
Patient will be mailed updated calendar.  °

## 2021-08-24 ENCOUNTER — Other Ambulatory Visit: Payer: Self-pay | Admitting: Osteopathic Medicine

## 2021-09-12 ENCOUNTER — Ambulatory Visit (INDEPENDENT_AMBULATORY_CARE_PROVIDER_SITE_OTHER): Payer: Medicare Other | Admitting: Family Medicine

## 2021-09-12 ENCOUNTER — Other Ambulatory Visit: Payer: Self-pay

## 2021-09-12 VITALS — BP 109/80 | HR 86 | Ht 64.0 in | Wt 152.1 lb

## 2021-09-12 DIAGNOSIS — Z0000 Encounter for general adult medical examination without abnormal findings: Secondary | ICD-10-CM

## 2021-09-12 NOTE — Progress Notes (Signed)
MEDICARE ANNUAL WELLNESS VISIT  09/12/2021  Subjective:  Cindy [name] is a 76 y.o. female patient of Emeterio Reeve, DO who had a Medicare Annual Wellness Visit today. Cindy [name] is Retired and lives with their spouse. she has 2 children. she reports that she is socially active and does interact with friends/family regularly. she is minimally physically active and enjoys gardening and water colors.  Patient Care Team: Emeterio Reeve, DO as PCP - General (Osteopathic Medicine) Stark Klein, MD as Consulting Physician (General Surgery) Nicholas Lose, MD as Consulting Physician (Hematology and Oncology) Eppie Gibson, MD as Attending Physician (Radiation Oncology)  Advanced Directives 09/12/2021 03/30/2021 02/14/2021 01/10/2021 01/04/2021 01/02/2021 12/19/2020  Does Patient Have a Medical Advance Directive? Yes Yes Yes Yes Yes Yes Yes  Type of Advance Directive Living will;Healthcare Power of [place];Living will [place];Living will [place];Living will - [place];Living will [place];Living will  Does patient want to make changes to medical advance directive? No - Patient declined No - Patient declined No - Patient declined No - Patient declined No - Patient declined - -  Copy of [place] in Chart? No - copy requested Yes - validated most recent copy scanned in chart (See row information) - Yes - validated most recent copy scanned in chart (See row information) - Yes - validated most recent copy scanned in chart (See row information) Yes - validated most recent copy scanned in chart (See row information)  Would patient like information on creating a medical advance directive? - - - - - [facility] Utilization Over the Past 12 Months: # of hospitalizations or ER visits: 1 # of surgeries: 1  Review of Systems    Patient reports that her overall health is better  when compared to last year.  Review of Systems: History obtained from chart review and the patient  All other systems negative.  Pain Assessment Pain : No/denies pain     Current Medications & Allergies (verified) Allergies as of 09/12/2021       Reactions   Sulfamethoxazole-trimethoprim Rash   Tetracyclines & Related Rash        Medication List        Accurate as of September 12, 2021  9:39 AM. If you have any questions, ask your nurse or doctor.          acetaminophen 500 MG tablet Commonly known as: TYLENOL Take 1,000 mg by mouth every 6 (six) hours as needed for moderate pain.   Calcium-Vitamin D-Vitamin K 500-100-40 MG-UNT-MCG Chew Chew 2 tablets by mouth daily.   celecoxib 200 MG capsule Commonly known as: CeleBREX One to 2 tablets by mouth daily as needed for pain.   cetirizine 10 MG tablet Commonly known as: ZYRTEC Take 1 tablet (10 mg total) by mouth daily as needed for allergies.   cycloSPORINE 0.05 % ophthalmic emulsion Commonly known as: RESTASIS Place 1 drop into both eyes 2 (two) times daily as needed (dry eyes).   denosumab 60 MG/ML Sosy injection Commonly known as: PROLIA Inject 60 mg into the skin every 6 (six) months.   diclofenac sodium 1 % Gel Commonly known as: VOLTAREN Apply 4 g topically 4 (four) times daily. What changed:  how much to take when to take this reasons to take this   escitalopram 5 MG tablet Commonly known as: LEXAPRO Take 1 tablet (5 mg total) by mouth daily.   esomeprazole  40 MG capsule Commonly known as: NEXIUM Take 1 capsule (40 mg total) by mouth daily.   fluticasone 50 MCG/ACT nasal spray Commonly known as: Flonase Place 2 sprays into both nostrils daily. What changed: additional instructions   hydrochlorothiazide 25 MG tablet Commonly known as: HYDRODIURIL TAKE 1 TABLET BY MOUTH ONCE DAILY AS NEEDED SWELLING   levothyroxine 75 MCG tablet Commonly known as: SYNTHROID Take 1 tablet (75 mcg total) by  mouth daily before breakfast.   montelukast 10 MG tablet Commonly known as: SINGULAIR Take 1 tablet (10 mg total) by mouth at bedtime. What changed:  when to take this reasons to take this   NIFEdipine 30 MG 24 hr tablet Commonly known as: PROCARDIA-XL/NIFEDICAL-XL Take 1 tablet (30 mg total) by mouth daily. NO REFILLS. NEEDS TO TRANSITION CARE TO NEW PCP.   nitroGLYCERIN 0.4 MG SL tablet Commonly known as: NITROSTAT Place 1 tablet (0.4 mg total) under the tongue every 5 (five) minutes as needed for chest pain.   PROBIOTIC PO Take 1 capsule by mouth daily.   rosuvastatin 20 MG tablet Commonly known as: CRESTOR Take 1 tablet (20 mg total) by mouth daily.   SUMAtriptan 100 MG tablet Commonly known as: IMITREX Take 0.5-1 tablets (50-100 mg total) by mouth every 2 (two) hours as needed for migraine (Max 2 pills per 24 hours).   vitamin B-12 500 MCG tablet Commonly known as: CYANOCOBALAMIN Take 500 mcg by mouth daily.        History (reviewed): Past Medical History:  Diagnosis Date   Abnormal mammogram    Allergic rhinitis 10/30/2018   Allergy    Tetracycoline. Sulfa   Anxiety in acute stress reaction 10/30/2018   Arthritis    Atypical chest pain 10/30/2018   Cancer [facility])    breast    Cataract    Both eyes surgery   Chest pain    "not cardiac related"   Colon polyps    Dyspnea    DOE   Family history of breast cancer 07/05/2020   Family history of pancreatic cancer 07/05/2020   Family history of prostate cancer 07/05/2020   GERD (gastroesophageal reflux disease) 10/30/2018   History of kidney stones    passed   Hypertension 10/30/2018   Hyperthyroidism    Hypothyroidism 10/30/2018   Migraine 10/30/2018   Osteoporosis 10/30/2018   Personal history of chemotherapy    Personal history of radiation therapy    Pneumonia    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   Vitamin D deficiency 10/30/2018   Past Surgical History:  Procedure Laterality Date    ABDOMINAL HYSTERECTOMY     BREAST BIOPSY Left 06/26/2020   x2   BREAST LUMPECTOMY Left 08/15/2020   BREAST LUMPECTOMY WITH RADIOACTIVE SEED AND SENTINEL LYMPH NODE BIOPSY Left 08/15/2020   Procedure: LEFT BREAST LUMPECTOMY WITH RADIOACTIVE SEED AND SENTINEL LYMPH NODE BIOPSY;  Surgeon: Stark Klein, MD;  Location: [place];  Service: General;  Laterality: Left;  RNFA   COLONOSCOPY W/ POLYPECTOMY     ESOPHAGEAL DILATION     EYE SURGERY Bilateral    catheter with lens   HYSTERECTOMY ABDOMINAL WITH SALPINGECTOMY     PORT-A-CATH REMOVAL  01/04/2021   Procedure: REMOVAL PORT-A-CATH;  Surgeon: Stark Klein, MD;  Location: [place];  Service: General;;   PORTACATH PLACEMENT Right 08/15/2020   Procedure: INSERTION PORT-A-CATH;  Surgeon: Stark Klein, MD;  Location: [place];  Service: General;  Laterality: Right;   TONSILLECTOMY     TUBAL LIGATION  Family History  Problem Relation Age of Onset   High blood pressure Mother    Breast cancer Mother        dx before 29   Alzheimer's disease Mother    Cancer Mother    Heart attack Father    Prostate cancer Brother 47   Breast cancer Maternal Aunt        dx 6s   Cancer Maternal Aunt    Breast cancer Maternal Aunt        dx 85s   Breast cancer Maternal Grandmother        dx after 15   Cancer Maternal Grandmother    Leukemia Paternal Grandfather        dx 61s-80s   Pancreatic cancer Cousin        maternal; dx 22s   Cancer Maternal Aunt 50       unknown type   Breast cancer Cousin        paternal; dx 78s   Cancer Maternal Aunt    Diabetes Daughter    Social History   Socioeconomic History   Marital status: Married    Spouse name: Verdia Kuba "Dwayne"   Number of children: 2   Years of education: 15   Highest education level: Some college, no degree  Occupational History   Occupation: Retired.  Tobacco Use   Smoking status: Never   Smokeless tobacco: Never  Vaping Use   Vaping Use: Never used  Substance and  Sexual Activity   Alcohol use: Not Currently    Comment: rarely   Drug use: Never   Sexual activity: Not Currently    Partners: Male    Birth control/protection: None  Other Topics Concern   Not on file  Social History Narrative   Lives with her husband. She enjoys water colors and gardening.   Social Determinants of Health   Financial Resource Strain: Low Risk    Difficulty of Paying Living Expenses: Not hard at all  Food Insecurity: No Food Insecurity   Worried About Charity fundraiser in the Last Year: Never true   [place] in the Last Year: Never true  Transportation Needs: No Transportation Needs   Lack of Transportation (Medical): No   Lack of Transportation (Non-Medical): No  Physical Activity: Inactive   Days of Exercise per Week: 0 days   Minutes of Exercise per Session: 0 min  Stress: No Stress Concern Present   Feeling of Stress : Not at all  Social Connections: Moderately Isolated   Frequency of Communication with Friends and Family: More than three times a week   Frequency of Social Gatherings with Friends and Family: Once a week   Attends Religious Services: Never   Marine scientist or Organizations: No   Attends Archivist Meetings: Never   Marital Status: Married    Activities of Daily Living In your present state of health, do you have any difficulty performing the following activities: 09/12/2021 09/11/2021  Hearing? Tempie Donning  Comment wears hearing aids in both ears. -  Vision? N N  Difficulty concentrating or making decisions? N N  Walking or climbing stairs? N N  Comment - -  Dressing or bathing? N N  Doing errands, shopping? N N  Preparing Food and eating ? N N  Using the Toilet? N N  In the past six months, have you accidently leaked urine? N N  Do you have problems with loss of bowel control? N N  Managing your Medications? N N  Managing your Finances? N N  Housekeeping or managing your Housekeeping? N N  Some recent data  might be hidden    Patient Education/Literacy How often do you need to have someone help you when you read instructions, pamphlets, or other written materials from your doctor or pharmacy?: 1 - Never What is the last grade level you completed in school?: 12th grade and three years of college.  Exercise Current Exercise Habits: The patient does not participate in regular exercise at present, Exercise limited by: None identified  Diet Patient reports consuming  2-3  meals a day and 0-2 snack(s) a day Patient reports that her primary diet is: Regular Patient reports that she does have regular access to food.   Depression Screen PHQ 2/9 Scores 09/12/2021 06/12/2021 03/01/2019 10/30/2018  PHQ - 2 Score 0 0 0 0  PHQ- 9 Score - - 2 1     Fall Risk Fall Risk  09/12/2021 09/11/2021  Falls in the past year? 0 0  Number falls in past yr: 0 -  Injury with Fall? 0 -  Risk for fall due to : No Fall Risks -  Follow up Falls evaluation completed -     Objective:   BP 109/80 (BP Location: Right Arm, Patient Position: Sitting, Cuff Size: Normal)    Pulse 86    Ht 5\' 4"  (1.626 m)    Wt 152 lb 1.9 oz (69 kg)    SpO2 96%    BMI 26.11 kg/m   Last Weight  Most recent update: 09/12/2021  9:07 AM    Weight  69 kg (152 lb 1.9 oz)             Body mass index is 26.11 kg/m.  Hearing/Vision  Cindy [name] did not have difficulty with hearing/understanding during the face-to-face interview Cindy [name] did not have difficulty with her vision during the face-to-face interview Reports that she has not had a formal eye exam by an eye care professional within the past year Reports that she has not had a formal hearing evaluation within the past year  Cognitive Function: 6CIT Screen 09/12/2021  What Year? 0 points  What month? 0 points  What time? 0 points  Count back from 20 0 points  Months in reverse 0 points  Repeat phrase 2 points  Total Score 2    Normal Cognitive Function Screening: Yes (Normal:0-7,  Significant for Dysfunction: >8)  Immunization & Health Maintenance Record Immunization History  Administered Date(s) Administered   PFIZER(Purple Top)SARS-COV-2 Vaccination 07/05/2020, 07/26/2020   Tdap 09/09/2013    Health Maintenance  Topic Date Due   COVID-19 Vaccine (3 - Pfizer risk series) 09/28/2021 (Originally 08/23/2020)   INFLUENZA VACCINE  12/07/2021 (Originally 04/09/2021)   Zoster Vaccines- Shingrix (1 of 2) 12/11/2021 (Originally 02/20/1965)   Pneumonia Vaccine 1+ Years old (1 - PCV) 09/12/2022 (Originally 02/19/1952)   DEXA SCAN  05/10/2022   TETANUS/TDAP  09/10/2023   Hepatitis C Screening  Completed   HPV VACCINES  Aged Out       Assessment  This is a routine wellness examination for Cindy [name].  Health Maintenance: Due or Overdue There are no preventive care reminders to display for this patient.   Cindy [name] does not need a referral for Community Assistance: Care Management:   no Social Work:    no Prescription Assistance:  no Nutrition/Diabetes Education:  no   Plan:  Personalized Goals  Goals Addressed  This Visit's Progress     Patient Stated (pt-stated)        09/12/2021 AWV Goal: Exercise for General Health  Patient will verbalize understanding of the benefits of increased physical activity: Exercising regularly is important. It will improve your overall fitness, flexibility, and endurance. Regular exercise also will improve your overall health. It can help you control your weight, reduce stress, and improve your bone density. Over the next year, patient will increase physical activity as tolerated with a goal of at least 150 minutes of moderate physical activity per week.  You can tell that you are exercising at a moderate intensity if your heart starts beating faster and you start breathing faster but can still hold a conversation. Moderate-intensity exercise ideas include: Walking 1 mile (1.6 km) in about 15  minutes Biking Hiking Golfing Dancing Water aerobics Patient will verbalize understanding of everyday activities that increase physical activity by providing examples like the following: Yard work, such as: Sales promotion account executive Gardening Washing windows or floors Patient will be able to explain general safety guidelines for exercising:  Before you start a new exercise program, talk with your health care provider. Do not exercise so much that you hurt yourself, feel dizzy, or get very short of breath. Wear comfortable clothes and wear shoes with good support. Drink plenty of water while you exercise to prevent dehydration or heat stroke. Work out until your breathing and your heartbeat get faster.        Personalized Health Maintenance & Screening Recommendations  Pneumococcal vaccine  Influenza vaccine Shingrix vaccine Eye exam Bone density will be due in September, 2023.  Patient declined the vaccines at this time.  Lung Cancer Screening Recommended: no (Low Dose CT Chest recommended if Age 63-80 years, 30 pack-year currently smoking OR have quit w/in past 15 years) Hepatitis C Screening recommended: no HIV Screening recommended: no  Advanced Directives: Written information was not given per the patient's request.  Referrals & Orders No orders of the defined types were placed in this encounter.   Follow-up Plan Follow-up with Emeterio Reeve, DO as planned Bone density will be due in September, 2023. Medicare wellness visit in one year.  AVS printed and given to the patient.   I have personally reviewed and noted the following in the patients chart:   Medical and social history Use of alcohol, tobacco or illicit drugs  Current medications and supplements Functional ability and status Nutritional status Physical activity Advanced directives List of other  physicians Hospitalizations, surgeries, and ER visits in previous 12 months Vitals Screenings to include cognitive, depression, and falls Referrals and appointments  In addition, I have reviewed and discussed with patient certain preventive protocols, quality metrics, and best practice recommendations. A written personalized care plan for preventive services as well as general preventive health recommendations were provided to patient.     Tinnie Gens, RN  09/12/2021

## 2021-09-12 NOTE — Patient Instructions (Addendum)
[place] Maintenance Summary and Written Plan of Care  Ms. Cindy [name] ,  Thank you for allowing me to perform your Medicare Annual Wellness Visit and for your ongoing commitment to your health.   Health Maintenance & Immunization History Health Maintenance  Topic Date Due   COVID-19 Vaccine (3 - Pfizer risk series) 09/28/2021 (Originally 08/23/2020)   INFLUENZA VACCINE  12/07/2021 (Originally 04/09/2021)   Zoster Vaccines- Shingrix (1 of 2) 12/11/2021 (Originally 02/20/1965)   Pneumonia Vaccine 37+ Years old (1 - PCV) 09/12/2022 (Originally 02/19/1952)   DEXA SCAN  05/10/2022   TETANUS/TDAP  09/10/2023   Hepatitis C Screening  Completed   HPV VACCINES  Aged Out   Immunization History  Administered Date(s) Administered   PFIZER(Purple Top)SARS-COV-2 Vaccination 07/05/2020, 07/26/2020   Tdap 09/09/2013    These are the patient goals that we discussed:  Goals Addressed               This Visit's Progress     Patient Stated (pt-stated)        09/12/2021 AWV Goal: Exercise for General Health  Patient will verbalize understanding of the benefits of increased physical activity: Exercising regularly is important. It will improve your overall fitness, flexibility, and endurance. Regular exercise also will improve your overall health. It can help you control your weight, reduce stress, and improve your bone density. Over the next year, patient will increase physical activity as tolerated with a goal of at least 150 minutes of moderate physical activity per week.  You can tell that you are exercising at a moderate intensity if your heart starts beating faster and you start breathing faster but can still hold a conversation. Moderate-intensity exercise ideas include: Walking 1 mile (1.6 km) in about 15 minutes Biking Hiking Golfing Dancing Water aerobics Patient will verbalize understanding of everyday activities that increase physical activity by  providing examples like the following: Yard work, such as: Sales promotion account executive Gardening Washing windows or floors Patient will be able to explain general safety guidelines for exercising:  Before you start a new exercise program, talk with your health care provider. Do not exercise so much that you hurt yourself, feel dizzy, or get very short of breath. Wear comfortable clothes and wear shoes with good support. Drink plenty of water while you exercise to prevent dehydration or heat stroke. Work out until your breathing and your heartbeat get faster.          This is a list of Health Maintenance Items that are overdue or due now: Pneumococcal vaccine  Influenza vaccine Shingrix vaccine    Orders/Referrals Placed Today: No orders of the defined types were placed in this encounter.  (Contact our referral department at [phone] if you have not spoken with someone about your referral appointment within the next 5 days)    Follow-up Plan Follow-up with Emeterio Reeve, DO as planned Bone density will be due in September, 2023. Medicare wellness visit in one year.  AVS printed and given to the patient.     Health Maintenance, Female Adopting a healthy lifestyle and getting preventive care are important in promoting health and wellness. Ask your health care provider about: The right schedule for you to have regular tests and exams. Things you can do on your own to prevent diseases and keep yourself healthy. What should I know about diet, weight, and exercise? Eat a healthy diet  Eat a  diet that includes plenty of vegetables, fruits, low-fat dairy products, and lean protein. Do not eat a lot of foods that are high in solid fats, added sugars, or sodium. Maintain a healthy weight Body mass index (BMI) is used to identify weight problems. It estimates body fat based on height and weight. Your  health care provider can help determine your BMI and help you achieve or maintain a healthy weight. Get regular exercise Get regular exercise. This is one of the most important things you can do for your health. Most adults should: Exercise for at least 150 minutes each week. The exercise should increase your heart rate and make you sweat (moderate-intensity exercise). Do strengthening exercises at least twice a week. This is in addition to the moderate-intensity exercise. Spend less time sitting. Even light physical activity can be beneficial. Watch cholesterol and blood lipids Have your blood tested for lipids and cholesterol at 78 years of age, then have this test every 5 years. Have your cholesterol levels checked more often if: Your lipid or cholesterol levels are high. You are older than 78 years of age. You are at high risk for heart disease. What should I know about cancer screening? Depending on your health history and family history, you may need to have cancer screening at various ages. This may include screening for: Breast cancer. Cervical cancer. Colorectal cancer. Skin cancer. Lung cancer. What should I know about heart disease, diabetes, and high blood pressure? Blood pressure and heart disease High blood pressure causes heart disease and increases the risk of stroke. This is more likely to develop in people who have high blood pressure readings or are overweight. Have your blood pressure checked: Every 3-5 years if you are 5-57 years of age. Every year if you are 41 years old or older. Diabetes Have regular diabetes screenings. This checks your fasting blood sugar level. Have the screening done: Once every three years after age 13 if you are at a normal weight and have a low risk for diabetes. More often and at a younger age if you are overweight or have a high risk for diabetes. What should I know about preventing infection? Hepatitis B If you have a higher risk for  hepatitis B, you should be screened for this virus. Talk with your health care provider to find out if you are at risk for hepatitis B infection. Hepatitis C Testing is recommended for: Everyone born from [birth year] through 1967. Anyone with known risk factors for hepatitis C. Sexually transmitted infections (STIs) Get screened for STIs, including gonorrhea and chlamydia, if: You are sexually active and are younger than 78 years of age. You are older than 78 years of age and your health care provider tells you that you are at risk for this type of infection. Your sexual activity has changed since you were last screened, and you are at increased risk for chlamydia or gonorrhea. Ask your health care provider if you are at risk. Ask your health care provider about whether you are at high risk for HIV. Your health care provider may recommend a prescription medicine to help prevent HIV infection. If you choose to take medicine to prevent HIV, you should first get tested for HIV. You should then be tested every 3 months for as long as you are taking the medicine. Pregnancy If you are about to stop having your period (premenopausal) and you may become pregnant, seek counseling before you get pregnant. Take 400 to 800 micrograms (mcg) of folic  acid every day if you become pregnant. Ask for birth control (contraception) if you want to prevent pregnancy. Osteoporosis and menopause Osteoporosis is a disease in which the bones lose minerals and strength with aging. This can result in bone fractures. If you are 44 years old or older, or if you are at risk for osteoporosis and fractures, ask your health care provider if you should: Be screened for bone loss. Take a calcium or vitamin D supplement to lower your risk of fractures. Be given hormone replacement therapy (HRT) to treat symptoms of menopause. Follow these instructions at home: Alcohol use Do not drink alcohol if: Your health care provider tells you not  to drink. You are pregnant, may be pregnant, or are planning to become pregnant. If you drink alcohol: Limit how much you have to: 0-1 drink a day. Know how much alcohol is in your drink. In the [place] one drink equals one 12 oz bottle of beer (355 mL), one 5 oz glass of wine (148 mL), or one 1 oz glass of hard liquor (44 mL). Lifestyle Do not use any products that contain nicotine or tobacco. These products include cigarettes, chewing tobacco, and vaping devices, such as e-cigarettes. If you need help quitting, ask your health care provider. Do not use street drugs. Do not share needles. Ask your health care provider for help if you need support or information about quitting drugs. General instructions Schedule regular health, dental, and eye exams. Stay current with your vaccines. Tell your health care provider if: You often feel depressed. You have ever been abused or do not feel safe at home. Summary Adopting a healthy lifestyle and getting preventive care are important in promoting health and wellness. Follow your health care provider's instructions about healthy diet, exercising, and getting tested or screened for diseases. Follow your health care provider's instructions on monitoring your cholesterol and blood pressure. This information is not intended to replace advice given to you by your health care provider. Make sure you discuss any questions you have with your health care provider. Document Revised: 01/15/2021 Document Reviewed: 01/15/2021 Elsevier Patient Education  [place].

## 2021-10-01 ENCOUNTER — Ambulatory Visit: Payer: Medicare Other | Attending: Radiation Oncology

## 2021-10-01 ENCOUNTER — Other Ambulatory Visit: Payer: Self-pay

## 2021-10-01 DIAGNOSIS — Z483 Aftercare following surgery for neoplasm: Secondary | ICD-10-CM | POA: Insufficient documentation

## 2021-10-01 NOTE — Therapy (Signed)
[facility] @ [address] [place], [place], [address] Phone: [phone]   Fax:  [phone]  Physical Therapy Treatment  Patient Details  Name: Cindy [name] MRN: [id number] Date of Birth: 10-Dec-1945 Referring Provider (PT): Reita May Date: 10/01/2021   PT End of Session - 10/01/21 1029     Visit Number 6   # unchanged due to screen only   PT Start Time 1026    Activity Tolerance Patient tolerated treatment well    Behavior During Therapy [facility] for tasks assessed/performed             Past Medical History:  Diagnosis Date   Abnormal mammogram    Allergic rhinitis 10/30/2018   Allergy    Tetracycoline. Sulfa   Anxiety in acute stress reaction 10/30/2018   Arthritis    Atypical chest pain 10/30/2018   Cancer [facility])    breast    Cataract    Both eyes surgery   Chest pain    "not cardiac related"   Colon polyps    Dyspnea    DOE   Family history of breast cancer 07/05/2020   Family history of pancreatic cancer 07/05/2020   Family history of prostate cancer 07/05/2020   GERD (gastroesophageal reflux disease) 10/30/2018   History of kidney stones    passed   Hypertension 10/30/2018   Hyperthyroidism    Hypothyroidism 10/30/2018   Migraine 10/30/2018   Osteoporosis 10/30/2018   Personal history of chemotherapy    Personal history of radiation therapy    Pneumonia    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   Vitamin D deficiency 10/30/2018    Past Surgical History:  Procedure Laterality Date   ABDOMINAL HYSTERECTOMY     BREAST BIOPSY Left 06/26/2020   x2   BREAST LUMPECTOMY Left 08/15/2020   BREAST LUMPECTOMY WITH RADIOACTIVE SEED AND SENTINEL LYMPH NODE BIOPSY Left 08/15/2020   Procedure: LEFT BREAST LUMPECTOMY WITH RADIOACTIVE SEED AND SENTINEL LYMPH NODE BIOPSY;  Surgeon: Stark Klein, MD;  Location: [facility];  Service: General;  Laterality: Left;  RNFA   COLONOSCOPY W/ POLYPECTOMY      ESOPHAGEAL DILATION     EYE SURGERY Bilateral    catheter with lens   HYSTERECTOMY ABDOMINAL WITH SALPINGECTOMY     PORT-A-CATH REMOVAL  01/04/2021   Procedure: REMOVAL PORT-A-CATH;  Surgeon: Stark Klein, MD;  Location: [facility];  Service: General;;   PORTACATH PLACEMENT Right 08/15/2020   Procedure: INSERTION PORT-A-CATH;  Surgeon: Stark Klein, MD;  Location: [facility];  Service: General;  Laterality: Right;   TONSILLECTOMY     TUBAL LIGATION      There were no vitals filed for this visit.   Subjective Assessment - 10/01/21 1027     Subjective Pt returns for her 3 month L-Dex screen.    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 She had left lumpectomy on 08/15/2020 wth 2 negative nodes removed. Ki67 is 80%. Radiation complete                    L-DEX FLOWSHEETS - 10/01/21 1000       L-DEX LYMPHEDEMA SCREENING   Measurement Type Unilateral    L-DEX MEASUREMENT EXTREMITY Upper Extremity    POSITION  Standing    DOMINANT SIDE Right    At Risk Side Left    BASELINE SCORE (UNILATERAL) 3.9    L-DEX SCORE (  UNILATERAL) 6.8    VALUE CHANGE (UNILAT) 2.9                                     PT Long Term Goals - 03/13/21 0806       PT LONG TERM GOAL #1   Title Pt will be independent in self MLD for long term management of lymphedema.    Baseline 03/13/21- pt feels independent with self MLD.    Time 5    Period Weeks    Status Achieved      PT LONG TERM GOAL #2   Title Pt will report a 50% improvement in L breast swelling to decrease risk of infection.    Baseline 03/13/21- 80% improvement    Time 5    Period Weeks    Status Achieved                   Plan - 10/01/21 1029     Clinical Impression Statement Pt returns for her 3 month L-Dex screen. Her change from baseline of 2.9 is WNLs so no further treatment is required at  this time except to cont every 3 month L-Dex screens which pt is agreeable to.    PT Next Visit Plan Continue with L-Dex every 3 months for up to 2 years from her SLNB (~08/15/22)    Consulted and Agree with Plan of Care Patient             Patient will benefit from skilled therapeutic intervention in order to improve the following deficits and impairments:     Visit Diagnosis: Aftercare following surgery for neoplasm     Problem List Patient Active Problem List   Diagnosis Date Noted   Port-A-Cath in place 09/12/2020   Genetic testing 07/11/2020   Family history of breast cancer 07/05/2020   Family history of prostate cancer 07/05/2020   Family history of pancreatic cancer 07/05/2020   Malignant neoplasm of upper-outer quadrant of left breast in female, estrogen receptor negative ([place]) 06/30/2020   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/23/2019   Primary osteoarthritis of both hands 06/23/2019   Trigger thumb of left hand 06/23/2019   Polyarthralgia 06/23/2019   Hypertension 10/30/2018   Atypical chest pain 10/30/2018   Migraine 10/30/2018   Hypothyroidism 10/30/2018   Osteoporosis 10/30/2018   GERD (gastroesophageal reflux disease) 10/30/2018   Anxiety in acute stress reaction 10/30/2018   Allergic rhinitis 10/30/2018   Vitamin D deficiency 10/30/2018   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    [name], PTA 10/01/2021, 10:31 AM  [facility] @ [address] [place], [place], [address] Phone: [phone]   Fax:  [phone]  Name: Cindy [name] MRN: [id number] Date of Birth: 07-15-1946

## 2021-10-08 DIAGNOSIS — H16223 Keratoconjunctivitis sicca, not specified as Sjogren's, bilateral: Secondary | ICD-10-CM | POA: Diagnosis not present

## 2021-10-08 DIAGNOSIS — H02536 Eyelid retraction left eye, unspecified eyelid: Secondary | ICD-10-CM | POA: Diagnosis not present

## 2021-10-08 DIAGNOSIS — H02533 Eyelid retraction right eye, unspecified eyelid: Secondary | ICD-10-CM | POA: Diagnosis not present

## 2021-10-08 DIAGNOSIS — H5213 Myopia, bilateral: Secondary | ICD-10-CM | POA: Diagnosis not present

## 2021-10-08 DIAGNOSIS — H40003 Preglaucoma, unspecified, bilateral: Secondary | ICD-10-CM | POA: Diagnosis not present

## 2021-10-12 ENCOUNTER — Other Ambulatory Visit: Payer: Self-pay

## 2021-10-12 ENCOUNTER — Ambulatory Visit (INDEPENDENT_AMBULATORY_CARE_PROVIDER_SITE_OTHER): Payer: Medicare Other | Admitting: Sports Medicine

## 2021-10-12 ENCOUNTER — Telehealth: Payer: Self-pay | Admitting: Sports Medicine

## 2021-10-12 ENCOUNTER — Ambulatory Visit (INDEPENDENT_AMBULATORY_CARE_PROVIDER_SITE_OTHER): Payer: Medicare Other

## 2021-10-12 DIAGNOSIS — M170 Bilateral primary osteoarthritis of knee: Secondary | ICD-10-CM | POA: Diagnosis not present

## 2021-10-12 MED ORDER — DICLOFENAC SODIUM 75 MG PO TBEC: 75.0000 mg | DELAYED_RELEASE_TABLET | Freq: Two times a day (BID) | ORAL | 3 refills | Status: DC

## 2021-10-12 NOTE — Telephone Encounter (Signed)
[employer] approval please, right knee only, failed everything including 6 wks physician directed PT. Medicare and [place]

## 2021-10-12 NOTE — Assessment & Plan Note (Signed)
Pleasant 76 year old female, known bilateral knee osteoarthritis right worse than left, last injection was October 2022. Recurrence of pain, repeat steroid injection today, she has developed tachyphylaxis with Celebrex so we will discontinue and switch to oral Voltaren, if this does not work we will switch to oral prescription strength naproxen, she does understand that sometimes we need to rotate NSAIDs as they lose their efficacy. Adding home conditioning, and I am going to get her approved for viscosupplementation.

## 2021-10-12 NOTE — Telephone Encounter (Signed)
[employer] paperwork faxed to [employer] at [phone] Request is for Orthovisc or Monovisc Pt's insurance prefers Orthovisc or Monovisc Fax confirmation receipt received

## 2021-10-12 NOTE — Progress Notes (Signed)
° ° °  Procedures performed today:    Procedure: Real-time Ultrasound Guided injection of the right knee Device: Samsung HS60  Verbal informed consent obtained.  Time-out conducted.  Noted no overlying erythema, induration, or other signs of local infection.  Skin prepped in a sterile fashion.  Local anesthesia: Topical Ethyl chloride.  With sterile technique and under real time ultrasound guidance: Noted trace effusion, 1 cc Kenalog 40, 2 cc lidocaine, 2 cc bupivacaine injected easily Completed without difficulty  Advised to call if fevers/chills, erythema, induration, drainage, or persistent bleeding.  Images permanently stored and available for review in PACS.  Impression: Technically successful ultrasound guided injection.  Independent interpretation of notes and tests performed by another provider:   None.  Brief History, Exam, Impression, and Recommendations:    Primary osteoarthritis of both knees Pleasant 76 year old female, known bilateral knee osteoarthritis right worse than left, last injection was October 2022. Recurrence of pain, repeat steroid injection today, she has developed tachyphylaxis with Celebrex so we will discontinue and switch to oral Voltaren, if this does not work we will switch to oral prescription strength naproxen, she does understand that sometimes we need to rotate NSAIDs as they lose their efficacy. Adding home conditioning, and I am going to get her approved for viscosupplementation.  Chronic process with exacerbation and pharmacologic intervention  ___________________________________________ Gwen Her. Dianah Field, M.D., ABFM., CAQSM. Primary Care and [place] Instructor of [place] [employer] [facility] of [employer]

## 2021-10-18 NOTE — Telephone Encounter (Signed)
Benefits Investigation Details received from [employer] °Injection: Monovisc ° °Medical: Deductible applies. Once the deductible has been met, patient is responsible for a coinsurance. Prior authorization is not required.  °PA required: No ° °Pharmacy: The product is not covered under the pharmacy plan.  ° °Specialty Pharmacy:  ° °May fill through: [facility] °OV Copay/Coinsurance: 20% °Product Copay: 20% °Administration Coinsurance: 20% °Administration Copay:  °Deductible: $226 (met: $195) °Out of Pocket Max:  ° °Left msg for patient to return call to office to discuss gel injections.  °

## 2021-10-19 NOTE — Telephone Encounter (Signed)
Spoke with patient, she is aware of her responsibility for the medication. She stated that she was doing ok right now and would let us know when and if she wished to proceed.

## 2021-10-21 ENCOUNTER — Other Ambulatory Visit: Payer: Self-pay | Admitting: Family Medicine

## 2021-10-21 DIAGNOSIS — K219 Gastro-esophageal reflux disease without esophagitis: Secondary | ICD-10-CM

## 2021-10-23 ENCOUNTER — Encounter (HOSPITAL_COMMUNITY): Payer: Self-pay

## 2021-10-26 IMAGING — MG MM BREAST SURGICAL SPECIMEN
1 series · 1 of 1 positions shown · non-contrast
Comparison: Previous exam(s).

CLINICAL DATA: Evaluate surgical specimen following LEFT lumpectomy
for breast cancers.

EXAM:
SPECIMEN RADIOGRAPH OF THE LEFT BREAST

[L]
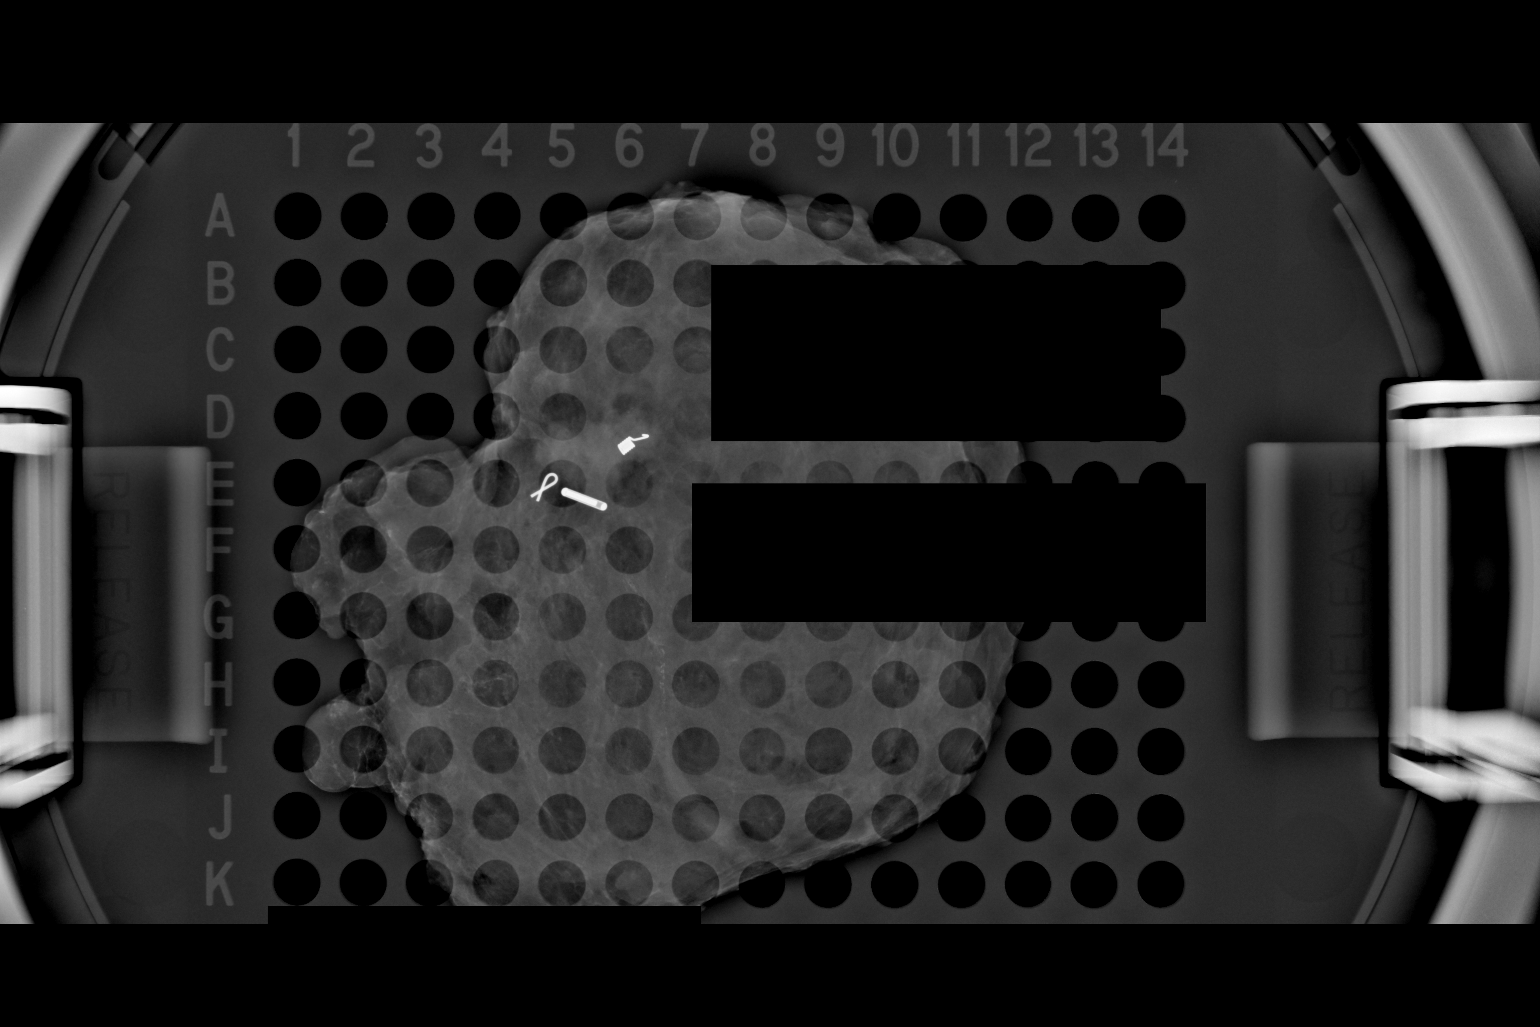

[1 of 1 positions shown; findings below may reference images not displayed]

FINDINGS: Status post excision of the LEFT breast. The radioactive seed,
RIBBON biopsy marker clip and COIL biopsy marker clip are present,
completely intact, and were marked for pathology.
IMPRESSION: Specimen radiograph of the LEFT breast.

## 2021-11-13 DIAGNOSIS — H401134 Primary open-angle glaucoma, bilateral, indeterminate stage: Secondary | ICD-10-CM | POA: Diagnosis not present

## 2021-11-13 DIAGNOSIS — H16223 Keratoconjunctivitis sicca, not specified as Sjogren's, bilateral: Secondary | ICD-10-CM | POA: Diagnosis not present

## 2021-11-13 DIAGNOSIS — H02533 Eyelid retraction right eye, unspecified eyelid: Secondary | ICD-10-CM | POA: Diagnosis not present

## 2021-11-13 DIAGNOSIS — H02536 Eyelid retraction left eye, unspecified eyelid: Secondary | ICD-10-CM | POA: Diagnosis not present

## 2021-11-16 ENCOUNTER — Other Ambulatory Visit: Payer: Self-pay

## 2021-11-16 DIAGNOSIS — K219 Gastro-esophageal reflux disease without esophagitis: Secondary | ICD-10-CM

## 2021-11-16 DIAGNOSIS — J309 Allergic rhinitis, unspecified: Secondary | ICD-10-CM

## 2021-11-16 MED ORDER — CETIRIZINE HCL 10 MG PO TABS: 10.0000 mg | ORAL_TABLET | Freq: Every day | ORAL | 0 refills | Status: DC | PRN

## 2021-11-16 MED ORDER — ESOMEPRAZOLE MAGNESIUM 40 MG PO CPDR: 40.0000 mg | DELAYED_RELEASE_CAPSULE | Freq: Every day | ORAL | 0 refills | Status: DC

## 2021-11-23 DIAGNOSIS — H401134 Primary open-angle glaucoma, bilateral, indeterminate stage: Secondary | ICD-10-CM | POA: Diagnosis not present

## 2021-11-28 ENCOUNTER — Other Ambulatory Visit: Payer: Self-pay | Admitting: Family Medicine

## 2021-11-28 ENCOUNTER — Other Ambulatory Visit: Payer: Self-pay

## 2021-11-28 DIAGNOSIS — F411 Generalized anxiety disorder: Secondary | ICD-10-CM

## 2021-11-28 MED ORDER — ESCITALOPRAM OXALATE 5 MG PO TABS: 5.0000 mg | ORAL_TABLET | Freq: Every day | ORAL | 0 refills | Status: DC

## 2021-12-10 NOTE — Progress Notes (Addendum)
?  HPI with pertinent ROS:  ? ?CC: Transfer of care, 6 month followup ? ?[id number] 76 y.o. female coming in for transfer of care and 6 month follow up. She is doing well today with no complaints.  ? ?Taking Procardia '30mg'$  for hypertension. Takes hydrochlorothiazide on an as needed basis for swelling. Took it once or twice last week, but otherwise not in the last couple of months. Denies CP, SOB, palpitations, LLE.  ? ?Taking Lexapro '5mg'$  once daily for Anxiety. Doing well on medication with no side effects.  ? ?Reports taking Zyrtec daily for allergies. No concerning symptoms that are bothering her.  ? ?History of reflux and started taking Nexium daily. Tried going without the nexium, but would have really bad indigestion. Doing well taking medication daily.  ? ?Has a history of migraines. She is unsure the last time she took Sumatriptan. Reports her migraines have eased, so she has not needed to take Sumatriptan.  ? ?She is still being followed by the cancer center. She is almost at one year since her surgery. She is feeling well with no complaints. She reports having her dexa scan and injections completed with them.  ? ?I reviewed the past medical history, family history, social history, surgical history, and allergies today and no changes were needed.  Please see the problem list section below in epic for further details. ? ? ?Physical exam:  ? ?General: Well Developed, well nourished, and in no acute distress.  ?Neuro: Alert and oriented x3.  ?HEENT: Normocephalic, atraumatic.  ?Skin: Warm and dry. ?Cardiac: Regular rate and rhythm, no murmurs rubs or gallops, no lower extremity edema.  ?Respiratory: Clear to auscultation bilaterally. Not using accessory muscles, speaking in full sentences. ? ?Impression and Recommendations:   ? ?1. Primary hypertension ?Stable today with no concerning symptoms. Continue Procardia '30mg'$  once daily. Continue HCTZ '25mg'$  tablet as needed for swelling. Will check routine lab work  today.  ?-CBC with differential ?-CMP with GFR ? ?2. Gastroesophageal reflux disease without esophagitis ?Continue Nexium '40mg'$  once daily.   ? ?3. Allergic rhinitis, unspecified seasonality, unspecified trigger ?Continue Zyrtec '10mg'$  once daily. Discussed switching medication to Xyzal or Allegra if she finds Zyrtec is no longer helping.   ? ?4. Hypothyroidism, unspecified type ?Continue Levothyroxine 56mg once daily. Will check lab work today and change medication as needed.  ?-TSH ?-T4,free  ? ?5. Age-related osteoporosis without current pathological fracture ?Being managed by oncology. ? ?6. Migraine without status migrainosus, not intractable, unspecified migraine type ?Continue Sumitriptan '100mg'$  as prescribed as needed.  ? ?7. Vitamin D deficiency ?-Continue vitamin D supplement as prescribed.  ? ?8. Anxiety in acute stress reaction ?Tolerating well with no concern. Continue Lexapro '5mg'$ .  ? ?9. Hyperlipidemia, group A ?Continue rosuvastatin '20mg'$  tablet once daily. Will check lab work today. ?-Lipid panel ? ? ?Return in about 6 months (around 06/12/2022) for chronic disease follow up. ?___________________________________________ ?SJeanann Lewandowsky Student NP ?

## 2021-12-11 ENCOUNTER — Encounter: Payer: Self-pay | Admitting: Medical-Surgical

## 2021-12-11 ENCOUNTER — Ambulatory Visit (INDEPENDENT_AMBULATORY_CARE_PROVIDER_SITE_OTHER): Payer: Medicare Other | Admitting: Medical-Surgical

## 2021-12-11 VITALS — BP 114/81 | HR 70 | Resp 20 | Ht 64.0 in | Wt 143.7 lb

## 2021-12-11 DIAGNOSIS — F411 Generalized anxiety disorder: Secondary | ICD-10-CM | POA: Diagnosis not present

## 2021-12-11 DIAGNOSIS — G43909 Migraine, unspecified, not intractable, without status migrainosus: Secondary | ICD-10-CM

## 2021-12-11 DIAGNOSIS — F430 Acute stress reaction: Secondary | ICD-10-CM | POA: Diagnosis not present

## 2021-12-11 DIAGNOSIS — M810 Age-related osteoporosis without current pathological fracture: Secondary | ICD-10-CM

## 2021-12-11 DIAGNOSIS — E039 Hypothyroidism, unspecified: Secondary | ICD-10-CM | POA: Diagnosis not present

## 2021-12-11 DIAGNOSIS — E875 Hyperkalemia: Secondary | ICD-10-CM

## 2021-12-11 DIAGNOSIS — E559 Vitamin D deficiency, unspecified: Secondary | ICD-10-CM | POA: Diagnosis not present

## 2021-12-11 DIAGNOSIS — J309 Allergic rhinitis, unspecified: Secondary | ICD-10-CM | POA: Diagnosis not present

## 2021-12-11 DIAGNOSIS — E7800 Pure hypercholesterolemia, unspecified: Secondary | ICD-10-CM | POA: Diagnosis not present

## 2021-12-11 DIAGNOSIS — K219 Gastro-esophageal reflux disease without esophagitis: Secondary | ICD-10-CM

## 2021-12-11 DIAGNOSIS — I10 Essential (primary) hypertension: Secondary | ICD-10-CM

## 2021-12-11 MED ORDER — ESOMEPRAZOLE MAGNESIUM 40 MG PO CPDR: 40.0000 mg | DELAYED_RELEASE_CAPSULE | Freq: Every day | ORAL | 1 refills | Status: DC

## 2021-12-11 MED ORDER — LEVOTHYROXINE SODIUM 75 MCG PO TABS: 75.0000 ug | ORAL_TABLET | Freq: Every day | ORAL | 1 refills | Status: DC

## 2021-12-11 MED ORDER — HYDROCHLOROTHIAZIDE 25 MG PO TABS: ORAL_TABLET | ORAL | 1 refills | Status: DC

## 2021-12-11 MED ORDER — ESCITALOPRAM OXALATE 5 MG PO TABS: 5.0000 mg | ORAL_TABLET | Freq: Every day | ORAL | 1 refills | Status: DC

## 2021-12-11 MED ORDER — NIFEDIPINE ER OSMOTIC RELEASE 30 MG PO TB24: 30.0000 mg | ORAL_TABLET | Freq: Every day | ORAL | 1 refills | Status: DC

## 2021-12-11 MED ORDER — CETIRIZINE HCL 10 MG PO TABS: 10.0000 mg | ORAL_TABLET | Freq: Every day | ORAL | 1 refills | Status: DC | PRN

## 2021-12-11 NOTE — Progress Notes (Signed)
Medical screening examination/treatment was performed by qualified nurse practitioner student and as supervising provider I was immediately available for consultation/collaboration. I have reviewed documentation and agree with assessment and plan. ° °Dionis Autry L. Shallen Luedke, DNP, APRN, FNP-BC °[facility] °Primary Care and Sports Medicine ° °

## 2021-12-12 LAB — T4, FREE: Free T4: 1.4 ng/dL (ref 0.8–1.8)

## 2021-12-12 LAB — TSH: TSH: 1.69 mIU/L (ref 0.40–4.50)

## 2021-12-12 LAB — CBC WITH DIFFERENTIAL/PLATELET
Absolute Monocytes: 488 cells/uL (ref 200–950)
Basophils Absolute: 21 cells/uL (ref 0–200)
Basophils Relative: 0.5 %
Eosinophils Absolute: 78 cells/uL (ref 15–500)
Eosinophils Relative: 1.9 %
HCT: 43.7 % (ref 35.0–45.0)
Hemoglobin: 14.3 g/dL (ref 11.7–15.5)
Lymphs Abs: 972 cells/uL (ref 850–3900)
MCH: 31.6 pg (ref 27.0–33.0)
MCHC: 32.7 g/dL (ref 32.0–36.0)
MCV: 96.7 fL (ref 80.0–100.0)
MPV: 10.5 fL (ref 7.5–12.5)
Monocytes Relative: 11.9 %
Neutro Abs: 2542 cells/uL (ref 1500–7800)
Neutrophils Relative %: 62 %
Platelets: 194 10*3/uL (ref 140–400)
RBC: 4.52 10*6/uL (ref 3.80–5.10)
RDW: 12.2 % (ref 11.0–15.0)
Total Lymphocyte: 23.7 %
WBC: 4.1 10*3/uL (ref 3.8–10.8)

## 2021-12-12 LAB — COMPLETE METABOLIC PANEL WITH GFR
AG Ratio: 1.8 (calc) (ref 1.0–2.5)
ALT: 28 U/L (ref 6–29)
AST: 32 U/L (ref 10–35)
Albumin: 4.2 g/dL (ref 3.6–5.1)
Alkaline phosphatase (APISO): 84 U/L (ref 37–153)
BUN: 17 mg/dL (ref 7–25)
CO2: 29 mmol/L (ref 20–32)
Calcium: 9.2 mg/dL (ref 8.6–10.4)
Chloride: 108 mmol/L (ref 98–110)
Creat: 0.98 mg/dL (ref 0.60–1.00)
Globulin: 2.3 g/dL (calc) (ref 1.9–3.7)
Glucose, Bld: 90 mg/dL (ref 65–99)
Potassium: 5.6 mmol/L — ABNORMAL HIGH (ref 3.5–5.3)
Sodium: 143 mmol/L (ref 135–146)
Total Bilirubin: 0.6 mg/dL (ref 0.2–1.2)
Total Protein: 6.5 g/dL (ref 6.1–8.1)
eGFR: 59 mL/min/{1.73_m2} — ABNORMAL LOW (ref >=60)

## 2021-12-12 NOTE — Addendum Note (Signed)
Addended bySamuel Bouche on: 12/12/2021 12:54 PM ? ? Modules accepted: Orders ? ?

## 2021-12-18 ENCOUNTER — Encounter: Payer: Self-pay | Admitting: Medical-Surgical

## 2021-12-18 ENCOUNTER — Encounter: Payer: Self-pay | Admitting: Sports Medicine

## 2021-12-18 DIAGNOSIS — E875 Hyperkalemia: Secondary | ICD-10-CM | POA: Diagnosis not present

## 2021-12-18 LAB — CBC
HCT: 42.5 % (ref 35.0–45.0)
Hemoglobin: 14 g/dL (ref 11.7–15.5)
MCH: 31.5 pg (ref 27.0–33.0)
MCHC: 32.9 g/dL (ref 32.0–36.0)
MCV: 95.7 fL (ref 80.0–100.0)
MPV: 10.6 fL (ref 7.5–12.5)
Platelets: 183 10*3/uL (ref 140–400)
RBC: 4.44 10*6/uL (ref 3.80–5.10)
RDW: 12.3 % (ref 11.0–15.0)
WBC: 3.5 10*3/uL — ABNORMAL LOW (ref 3.8–10.8)

## 2021-12-18 NOTE — Telephone Encounter (Signed)
Please see 10/12/2021 note, lets go ahead and get her Monovisc in stock. ?

## 2021-12-19 ENCOUNTER — Encounter: Payer: Self-pay | Admitting: Medical-Surgical

## 2021-12-19 DIAGNOSIS — E875 Hyperkalemia: Secondary | ICD-10-CM

## 2021-12-19 NOTE — Telephone Encounter (Signed)
Re-ran benefits prior to ordering Monovisc.  ? ?Benefits Investigation Details received from [employer] ?Injection: Monovisc ? ?Medical: PRIMARY: Deductible applies. Since the deductible has been met, patient is responsible for a coinsurance. Prior authorization is not required. SECONDARY: Follows Medicare guidelines. It will pick up remaining eligible expenses at 100%. It does cover the Medicare part B deductible.  ?PA required: No ? ?Pharmacy: The product is not covered under the pharmacy plan.  ? ?Specialty Pharmacy:  ? ?May fill through: [place] ?OV Copay/Coinsurance: 20% ?Product Copay: 20% ?Administration Coinsurance: 20% ?Administration Copay:  ?Deductible: $226 (met: $226) ?Out of Pocket Max:   ? ?Patient aware of the coverage and her responsibility. The medication has been ordered and patient will be notified when it has been received so that she may ben scheduled.  ?

## 2021-12-24 DIAGNOSIS — E875 Hyperkalemia: Secondary | ICD-10-CM | POA: Diagnosis not present

## 2021-12-24 NOTE — Telephone Encounter (Signed)
Please call patient top schedule for Monovisc injection.  ?

## 2021-12-24 NOTE — Telephone Encounter (Signed)
Patient has been scheduled for 01/03/22. ?

## 2021-12-25 LAB — COMPLETE METABOLIC PANEL WITH GFR
AG Ratio: 1.8 (calc) (ref 1.0–2.5)
ALT: 27 U/L (ref 6–29)
AST: 30 U/L (ref 10–35)
Albumin: 4.2 g/dL (ref 3.6–5.1)
Alkaline phosphatase (APISO): 88 U/L (ref 37–153)
BUN/Creatinine Ratio: 18 (calc) (ref 6–22)
BUN: 19 mg/dL (ref 7–25)
CO2: 31 mmol/L (ref 20–32)
Calcium: 9.8 mg/dL (ref 8.6–10.4)
Chloride: 105 mmol/L (ref 98–110)
Creat: 1.07 mg/dL — ABNORMAL HIGH (ref 0.60–1.00)
Globulin: 2.4 g/dL (calc) (ref 1.9–3.7)
Glucose, Bld: 100 mg/dL — ABNORMAL HIGH (ref 65–99)
Potassium: 5.4 mmol/L — ABNORMAL HIGH (ref 3.5–5.3)
Sodium: 143 mmol/L (ref 135–146)
Total Bilirubin: 0.8 mg/dL (ref 0.2–1.2)
Total Protein: 6.6 g/dL (ref 6.1–8.1)
eGFR: 53 mL/min/{1.73_m2} — ABNORMAL LOW (ref >=60)

## 2021-12-31 ENCOUNTER — Ambulatory Visit: Payer: Medicare Other | Attending: Radiation Oncology

## 2021-12-31 VITALS — Wt 144.2 lb

## 2021-12-31 DIAGNOSIS — Z483 Aftercare following surgery for neoplasm: Secondary | ICD-10-CM | POA: Insufficient documentation

## 2021-12-31 NOTE — Therapy (Signed)
?OUTPATIENT PHYSICAL THERAPY SOZO SCREENING NOTE ? ? ?Patient Name: Cindy [name] ?MRN: [id number] ?DOB:15-Jun-1946, 76 y.o., female ?Today's Date: 12/31/2021 ? ?PCP: Samuel Bouche, NP ?REFERRING PROVIDER: Eppie Gibson, MD ? ? PT End of Session - 12/31/21 1541   ? ? Visit Number 6   # unchanged due to screen only  ? PT Start Time 1540   ? PT Stop Time 0388   ? PT Time Calculation (min) 4 min   ? Activity Tolerance Patient tolerated treatment well   ? Behavior During Therapy [facility] for tasks assessed/performed   ? ?  ?  ? ?  ? ? ?Past Medical History:  ?Diagnosis Date  ? Abnormal mammogram   ? Allergic rhinitis 10/30/2018  ? Allergy   ? Tetracycoline. Sulfa  ? Anxiety in acute stress reaction 10/30/2018  ? Arthritis   ? Atypical chest pain 10/30/2018  ? Cancer [facility])   ? breast   ? Cataract   ? Both eyes surgery  ? Chest pain   ? "not cardiac related"  ? Colon polyps   ? Dyspnea   ? DOE  ? Family history of breast cancer 07/05/2020  ? Family history of pancreatic cancer 07/05/2020  ? Family history of prostate cancer 07/05/2020  ? GERD (gastroesophageal reflux disease) 10/30/2018  ? History of kidney stones   ? passed  ? Hypertension 10/30/2018  ? Hyperthyroidism   ? Hypothyroidism 10/30/2018  ? Migraine 10/30/2018  ? Osteoporosis 10/30/2018  ? Personal history of chemotherapy   ? Personal history of radiation therapy   ? Pneumonia   ? 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  ? Vitamin D deficiency 10/30/2018  ? ?Past Surgical History:  ?Procedure Laterality Date  ? ABDOMINAL HYSTERECTOMY    ? BREAST BIOPSY Left 06/26/2020  ? x2  ? BREAST LUMPECTOMY Left 08/15/2020  ? BREAST LUMPECTOMY WITH RADIOACTIVE SEED AND SENTINEL LYMPH NODE BIOPSY Left 08/15/2020  ? Procedure: LEFT BREAST LUMPECTOMY WITH RADIOACTIVE SEED AND SENTINEL LYMPH NODE BIOPSY;  Surgeon: Stark Klein, MD;  Location: [place];  Service: General;  Laterality: Left;  RNFA  ? COLONOSCOPY W/ POLYPECTOMY    ? ESOPHAGEAL DILATION    ? EYE SURGERY Bilateral   ?  catheter with lens  ? HYSTERECTOMY ABDOMINAL WITH SALPINGECTOMY    ? PORT-A-CATH REMOVAL  01/04/2021  ? Procedure: REMOVAL PORT-A-CATH;  Surgeon: Stark Klein, MD;  Location[facility];  Service: General;;  ? PORTACATH PLACEMENT Right 08/15/2020  ? Procedure: INSERTION PORT-A-CATH;  Surgeon: Stark Klein, MD;  Location: [place];  Service: General;  Laterality: Right;  ? TONSILLECTOMY    ? TUBAL LIGATION    ? ?Patient Active Problem List  ? Diagnosis Date Noted  ? Port-A-Cath in place 09/12/2020  ? Genetic testing 07/11/2020  ? Family history of breast cancer 07/05/2020  ? Family history of prostate cancer 07/05/2020  ? Malignant neoplasm of upper-outer quadrant of left breast in female, estrogen receptor negative ([place]) 06/30/2020  ? 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/23/2019  ? Primary osteoarthritis of both hands 06/23/2019  ? Trigger thumb of left hand 06/23/2019  ? Polyarthralgia 06/23/2019  ? Hypertension 10/30/2018  ? Atypical chest pain 10/30/2018  ? Migraine 10/30/2018  ? Hypothyroidism 10/30/2018  ? Osteoporosis 10/30/2018  ? GERD (gastroesophageal reflux disease) 10/30/2018  ? Anxiety in acute stress reaction 10/30/2018  ? Allergic rhinitis 10/30/2018  ? Vitamin D deficiency 10/30/2018  ? ? ?REFERRING DIAG: left breast cancer at risk for lymphedema ? ?THERAPY DIAG:  ?Aftercare  following surgery for neoplasm ? 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 She had left lumpectomy on 08/15/2020 wth 2 negative nodes removed. Ki67 is 80%. Radiation complete  ? ?PRECAUTIONS: left UE Lymphedema risk, None ? ?SUBJECTIVE: Pt returns for her 3 month L-Dex screen.  ? ?PAIN:  ?Are you having pain? No ? ?SOZO SCREENING: ?Patient was assessed today using the SOZO machine to determine the lymphedema index score. This was compared to her baseline score. It was determined that she is within the recommended range when  compared to her baseline and no further action is needed at this time. She will continue SOZO screenings. These are done every 3 months for 2 years post operatively followed by every 6 months for 2 years, and then annually. ? ? ? ?[name], PTA ?12/31/2021, 3:45 PM ? ?  ? ?

## 2022-01-03 ENCOUNTER — Other Ambulatory Visit: Payer: Self-pay | Admitting: Sports Medicine

## 2022-01-03 ENCOUNTER — Ambulatory Visit (INDEPENDENT_AMBULATORY_CARE_PROVIDER_SITE_OTHER): Payer: Medicare Other | Admitting: Sports Medicine

## 2022-01-03 ENCOUNTER — Ambulatory Visit (INDEPENDENT_AMBULATORY_CARE_PROVIDER_SITE_OTHER): Payer: Medicare Other

## 2022-01-03 DIAGNOSIS — M1711 Unilateral primary osteoarthritis, right knee: Secondary | ICD-10-CM | POA: Diagnosis not present

## 2022-01-03 DIAGNOSIS — M170 Bilateral primary osteoarthritis of knee: Secondary | ICD-10-CM | POA: Diagnosis not present

## 2022-01-03 DIAGNOSIS — F411 Generalized anxiety disorder: Secondary | ICD-10-CM

## 2022-01-03 NOTE — Progress Notes (Signed)
? ? ?  Procedures performed today:   ? ?Procedure: Real-time Ultrasound Guided injection of the right knee ?Device: Samsung HS60  ?Verbal informed consent obtained.  ?Time-out conducted.  ?Noted no overlying erythema, induration, or other signs of local infection.  ?Skin prepped in a sterile fashion.  ?Local anesthesia: Topical Ethyl chloride.  ?With sterile technique and under real time ultrasound guidance: Noted mild effusion, 88 mg/4 mL of MonoVisc (sodium hyaluronate) in a prefilled syringe was injected easily into the knee through a 22-gauge needle. ?Completed without difficulty  ?Advised to call if fevers/chills, erythema, induration, drainage, or persistent bleeding.  ?Images permanently stored and available for review in PACS.  ?Impression: Technically successful ultrasound guided injection. ? ?Independent interpretation of notes and tests performed by another provider:  ? ?None. ? ?Brief History, Exam, Impression, and Recommendations:   ? ?Primary osteoarthritis of both knees ?Pleasant 76 year old female, known bilateral knee osteoarthritis, right worse than left, we did a steroid injection back in February, and we worked on getting her approved for [employer], she was approved for [employer] so we did Monovisc injection in the right knee today, return to see me in 6 weeks. ? ? ? ?___________________________________________ ?Gwen Her. Dianah Field, M.D., ABFM., CAQSM. ?Primary Care and Sports Medicine ?[facility] ? ?Adjunct Instructor of Family Medicine  ?[employer] ?

## 2022-01-03 NOTE — Assessment & Plan Note (Signed)
Pleasant 76 year old female, known bilateral knee osteoarthritis, right worse than left, we did a steroid injection back in February, and we worked on getting her approved for [employer], she was approved for [employer] so we did Monovisc injection in the right knee today, return to see me in 6 weeks. ?

## 2022-02-07 DIAGNOSIS — H401133 Primary open-angle glaucoma, bilateral, severe stage: Secondary | ICD-10-CM | POA: Diagnosis not present

## 2022-02-08 IMAGING — CR DG CHEST 1V
1 series · 1 of 1 positions shown · non-contrast
Comparison: CT 05/24/2020.

CLINICAL DATA: Port-A-Cath placement.

EXAM:
CHEST  1 VIEW

[w chest pa]
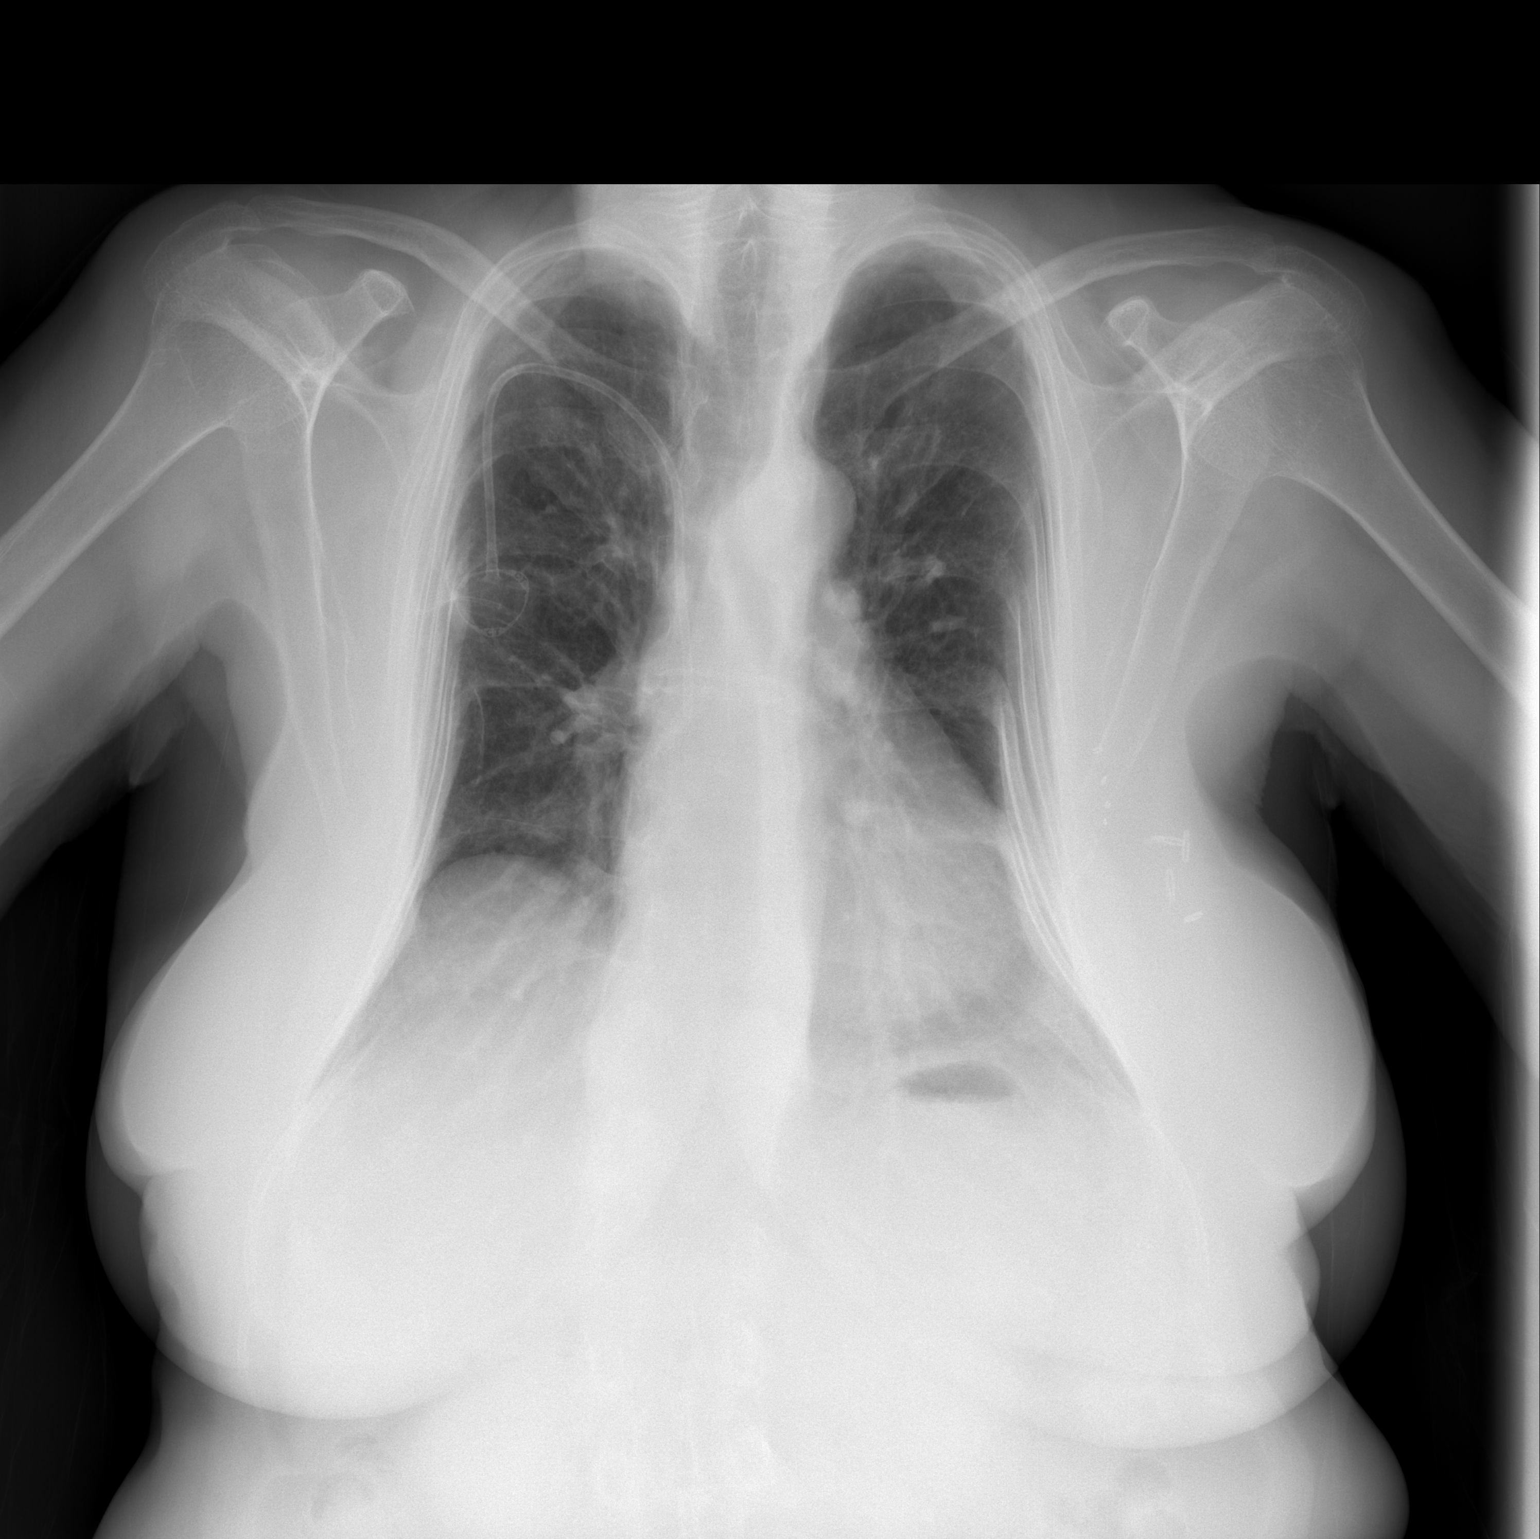

[1 of 1 positions shown; findings below may reference images not displayed]

FINDINGS: Port-A-Cath noted with tip over the SVC. Heart size normal. No focal
infiltrate. No pleural effusion or pneumothorax. Surgical clips left
chest.
IMPRESSION: Port-A-Cath noted with tip over the SVC. No pneumothorax. No acute
cardiopulmonary disease.

## 2022-02-14 ENCOUNTER — Ambulatory Visit (INDEPENDENT_AMBULATORY_CARE_PROVIDER_SITE_OTHER): Payer: Medicare Other | Admitting: Sports Medicine

## 2022-02-14 ENCOUNTER — Encounter: Payer: Self-pay | Admitting: Sports Medicine

## 2022-02-14 DIAGNOSIS — M170 Bilateral primary osteoarthritis of knee: Secondary | ICD-10-CM | POA: Diagnosis not present

## 2022-02-14 NOTE — Progress Notes (Signed)
    Procedures performed today:    None.  Independent interpretation of notes and tests performed by another provider:   None.  Brief History, Exam, Impression, and Recommendations:    Primary osteoarthritis of both knees Cindy [name] returns, she is a very pleasant 76 year old female with known bilateral knee osteoarthritis, right worse than left. She has had several steroid injections, the last of which was in February 2023, she did not have sufficient improvement so we proceeded with Monovisc, Monovisc was done back in April of this year, she returns today not feeling significant improvement. She does alternate Celebrex and Voltaren, she does some conditioning. At this point she has failed nonoperative treatment, I did let some of the next steps including genicular radiofrequency ablation, MRI looking for a meniscal tear with consideration of arthroscopic debridement, we did discuss the limitations of success with arthroscopic meniscal debridement in the setting of underlying osteoarthritis. We also discussed knee arthroplasty. She is interested in learning more about all of the above, we will proceed with an MRI and a referral to Dr. Berenice Primas for surgical evaluation.  For MRI insurance approval she is having persistent medial joint line pain with catching, failed conservative treatment including injections, NSAIDs, greater than 6 weeks of physician directed conservative treatment, looking for meniscal tearing.    ___________________________________________ Cindy [name]. Cindy [name], M.D., ABFM., CAQSM. Primary Care and [place] Instructor of [place] [employer]

## 2022-02-14 NOTE — Assessment & Plan Note (Signed)
Dot returns, she is a very pleasant 76 year old female with known bilateral knee osteoarthritis, right worse than left. She has had several steroid injections, the last of which was in February 2023, she did not have sufficient improvement so we proceeded with Monovisc, Monovisc was done back in April of this year, she returns today not feeling significant improvement. She does alternate Celebrex and Voltaren, she does some conditioning. At this point she has failed nonoperative treatment, I did let some of the next steps including genicular radiofrequency ablation, MRI looking for a meniscal tear with consideration of arthroscopic debridement, we did discuss the limitations of success with arthroscopic meniscal debridement in the setting of underlying osteoarthritis. We also discussed knee arthroplasty. She is interested in learning more about all of the above, we will proceed with an MRI and a referral to Dr. Berenice Primas for surgical evaluation.  For MRI insurance approval she is having persistent medial joint line pain with catching, failed conservative treatment including injections, NSAIDs, greater than 6 weeks of physician directed conservative treatment, looking for meniscal tearing.

## 2022-02-18 ENCOUNTER — Other Ambulatory Visit: Payer: Self-pay | Admitting: *Deleted

## 2022-02-18 DIAGNOSIS — Z171 Estrogen receptor negative status [ER-]: Secondary | ICD-10-CM

## 2022-02-19 ENCOUNTER — Inpatient Hospital Stay: Payer: Medicare Other

## 2022-02-19 ENCOUNTER — Ambulatory Visit: Payer: Medicare Other

## 2022-02-19 ENCOUNTER — Other Ambulatory Visit: Payer: Self-pay

## 2022-02-19 ENCOUNTER — Ambulatory Visit: Payer: Medicare Other | Admitting: Hematology and Oncology

## 2022-02-19 ENCOUNTER — Ambulatory Visit (INDEPENDENT_AMBULATORY_CARE_PROVIDER_SITE_OTHER): Payer: Medicare Other

## 2022-02-19 ENCOUNTER — Inpatient Hospital Stay: Payer: Medicare Other | Attending: Adult Health

## 2022-02-19 VITALS — BP 123/69 | HR 60 | Temp 97.7°F | Resp 18 | Ht 64.0 in | Wt 144.8 lb

## 2022-02-19 DIAGNOSIS — Z95828 Presence of other vascular implants and grafts: Secondary | ICD-10-CM

## 2022-02-19 DIAGNOSIS — M25561 Pain in right knee: Secondary | ICD-10-CM | POA: Diagnosis not present

## 2022-02-19 DIAGNOSIS — Z171 Estrogen receptor negative status [ER-]: Secondary | ICD-10-CM

## 2022-02-19 DIAGNOSIS — G8929 Other chronic pain: Secondary | ICD-10-CM

## 2022-02-19 DIAGNOSIS — C50412 Malignant neoplasm of upper-outer quadrant of left female breast: Secondary | ICD-10-CM | POA: Insufficient documentation

## 2022-02-19 DIAGNOSIS — M810 Age-related osteoporosis without current pathological fracture: Secondary | ICD-10-CM | POA: Diagnosis not present

## 2022-02-19 DIAGNOSIS — M170 Bilateral primary osteoarthritis of knee: Secondary | ICD-10-CM

## 2022-02-19 LAB — CMP (CANCER CENTER ONLY)
ALT: 20 U/L (ref 0–44)
AST: 24 U/L (ref 15–41)
Albumin: 4.1 g/dL (ref 3.5–5.0)
Alkaline Phosphatase: 77 U/L (ref 38–126)
Anion gap: 5 (ref 5–15)
BUN: 26 mg/dL — ABNORMAL HIGH (ref 8–23)
CO2: 30 mmol/L (ref 22–32)
Calcium: 9.7 mg/dL (ref 8.9–10.3)
Chloride: 106 mmol/L (ref 98–111)
Creatinine: 0.98 mg/dL (ref 0.44–1.00)
GFR, Estimated: 59 mL/min — ABNORMAL LOW (ref >60)
Glucose, Bld: 75 mg/dL (ref 70–99)
Potassium: 4.5 mmol/L (ref 3.5–5.1)
Sodium: 141 mmol/L (ref 135–145)
Total Bilirubin: 0.5 mg/dL (ref 0.3–1.2)
Total Protein: 6.8 g/dL (ref 6.5–8.1)

## 2022-02-19 LAB — CBC WITH DIFFERENTIAL (CANCER CENTER ONLY)
Abs Immature Granulocytes: 0.02 10*3/uL (ref 0.00–0.07)
Basophils Absolute: 0 10*3/uL (ref 0.0–0.1)
Basophils Relative: 1 %
Eosinophils Absolute: 0.1 10*3/uL (ref 0.0–0.5)
Eosinophils Relative: 2 %
HCT: 40.6 % (ref 36.0–46.0)
Hemoglobin: 13.6 g/dL (ref 12.0–15.0)
Immature Granulocytes: 0 %
Lymphocytes Relative: 24 %
Lymphs Abs: 1.2 10*3/uL (ref 0.7–4.0)
MCH: 32.1 pg (ref 26.0–34.0)
MCHC: 33.5 g/dL (ref 30.0–36.0)
MCV: 95.8 fL (ref 80.0–100.0)
Monocytes Absolute: 0.6 10*3/uL (ref 0.1–1.0)
Monocytes Relative: 11 %
Neutro Abs: 3.1 10*3/uL (ref 1.7–7.7)
Neutrophils Relative %: 62 %
Platelet Count: 162 10*3/uL (ref 150–400)
RBC: 4.24 MIL/uL (ref 3.87–5.11)
RDW: 12.9 % (ref 11.5–15.5)
WBC Count: 5 10*3/uL (ref 4.0–10.5)
nRBC: 0 % (ref 0.0–0.2)

## 2022-02-19 MED ORDER — DENOSUMAB 60 MG/ML ~~LOC~~ SOSY
60.0000 mg | PREFILLED_SYRINGE | Freq: Once | SUBCUTANEOUS | Status: AC
  Administered 2022-02-19: 60 mg via SUBCUTANEOUS
  Filled 2022-02-19: qty 1

## 2022-02-19 NOTE — Patient Instructions (Signed)
Denosumab injection What is this medication? DENOSUMAB (den oh sue mab) slows bone breakdown. Prolia is used to treat osteoporosis in women after menopause and in men, and in people who are taking corticosteroids for 6 months or more. Xgeva is used to treat a high calcium level due to cancer and to prevent bone fractures and other bone problems caused by multiple myeloma or cancer bone metastases. Xgeva is also used to treat giant cell tumor of the bone. This medicine may be used for other purposes; ask your health care provider or pharmacist if you have questions. COMMON BRAND NAME(S): Prolia, XGEVA What should I tell my care team before I take this medication? They need to know if you have any of these conditions: dental disease having surgery or tooth extraction infection kidney disease low levels of calcium or Vitamin D in the blood malnutrition on hemodialysis skin conditions or sensitivity thyroid or parathyroid disease an unusual reaction to denosumab, other medicines, foods, dyes, or preservatives pregnant or trying to get pregnant breast-feeding How should I use this medication? This medicine is for injection under the skin. It is given by a health care professional in a hospital or clinic setting. A special MedGuide will be given to you before each treatment. Be sure to read this information carefully each time. For Prolia, talk to your pediatrician regarding the use of this medicine in children. Special care may be needed. For Xgeva, talk to your pediatrician regarding the use of this medicine in children. While this drug may be prescribed for children as young as 13 years for selected conditions, precautions do apply. Overdosage: If you think you have taken too much of this medicine contact a poison control center or emergency room at once. NOTE: This medicine is only for you. Do not share this medicine with others. What if I miss a dose? It is important not to miss your dose.  Call your doctor or health care professional if you are unable to keep an appointment. What may interact with this medication? Do not take this medicine with any of the following medications: other medicines containing denosumab This medicine may also interact with the following medications: medicines that lower your chance of fighting infection steroid medicines like prednisone or cortisone This list may not describe all possible interactions. Give your health care provider a list of all the medicines, herbs, non-prescription drugs, or dietary supplements you use. Also tell them if you smoke, drink alcohol, or use illegal drugs. Some items may interact with your medicine. What should I watch for while using this medication? Visit your doctor or health care professional for regular checks on your progress. Your doctor or health care professional may order blood tests and other tests to see how you are doing. Call your doctor or health care professional for advice if you get a fever, chills or sore throat, or other symptoms of a cold or flu. Do not treat yourself. This drug may decrease your body's ability to fight infection. Try to avoid being around people who are sick. You should make sure you get enough calcium and vitamin D while you are taking this medicine, unless your doctor tells you not to. Discuss the foods you eat and the vitamins you take with your health care professional. See your dentist regularly. Brush and floss your teeth as directed. Before you have any dental work done, tell your dentist you are receiving this medicine. Do not become pregnant while taking this medicine or for 5 months after   stopping it. Talk with your doctor or health care professional about your birth control options while taking this medicine. Women should inform their doctor if they wish to become pregnant or think they might be pregnant. There is a potential for serious side effects to an unborn child. Talk to  your health care professional or pharmacist for more information. What side effects may I notice from receiving this medication? Side effects that you should report to your doctor or health care professional as soon as possible: allergic reactions like skin rash, itching or hives, swelling of the face, lips, or tongue bone pain breathing problems dizziness jaw pain, especially after dental work redness, blistering, peeling of the skin signs and symptoms of infection like fever or chills; cough; sore throat; pain or trouble passing urine signs of low calcium like fast heartbeat, muscle cramps or muscle pain; pain, tingling, numbness in the hands or feet; seizures unusual bleeding or bruising unusually weak or tired Side effects that usually do not require medical attention (report to your doctor or health care professional if they continue or are bothersome): constipation diarrhea headache joint pain loss of appetite muscle pain runny nose tiredness upset stomach This list may not describe all possible side effects. Call your doctor for medical advice about side effects. You may report side effects to FDA at 1-800-FDA-1088. Where should I keep my medication? This medicine is only given in a clinic, doctor's office, or other health care setting and will not be stored at home. NOTE: This sheet is a summary. It may not cover all possible information. If you have questions about this medicine, talk to your doctor, pharmacist, or health care provider.  2023 Elsevier/Gold Standard (2018-01-02 00:00:00)  

## 2022-02-20 ENCOUNTER — Encounter: Payer: Self-pay | Admitting: Sports Medicine

## 2022-02-25 DIAGNOSIS — S83241A Other tear of medial meniscus, current injury, right knee, initial encounter: Secondary | ICD-10-CM | POA: Diagnosis not present

## 2022-02-26 ENCOUNTER — Encounter: Payer: Self-pay | Admitting: Medical-Surgical

## 2022-03-01 ENCOUNTER — Telehealth: Payer: Self-pay | Admitting: Cardiology

## 2022-03-01 ENCOUNTER — Telehealth: Payer: Self-pay | Admitting: *Deleted

## 2022-03-01 NOTE — Telephone Encounter (Signed)
Please set up a virtual telephone visit for cardiac clearance.  Previous coronary CT in September 2021 showed clean coronary arteries.  0 calcium score.

## 2022-03-04 ENCOUNTER — Ambulatory Visit (INDEPENDENT_AMBULATORY_CARE_PROVIDER_SITE_OTHER): Payer: Medicare Other | Admitting: Physician Assistant

## 2022-03-04 DIAGNOSIS — Z01810 Encounter for preprocedural cardiovascular examination: Secondary | ICD-10-CM | POA: Diagnosis not present

## 2022-03-04 NOTE — Progress Notes (Signed)
Virtual Visit via Telephone Note   Because of Cindy [name] co-morbid illnesses, she is at least at moderate risk for complications without adequate follow up.  This format is felt to be most appropriate for this patient at this time.  The patient did not have access to video technology/had technical difficulties with video requiring transitioning to audio format only (telephone).  All issues noted in this document were discussed and addressed.  No physical exam could be performed with this format.  Please refer to the patient's chart for her consent to telehealth for [facility].  Evaluation Performed:  Preoperative cardiovascular risk assessment _____________   Date:  03/04/2022   Patient ID:  Cindy [name], DOB 1945/10/28, MRN [id number] Patient Location:  Home Provider location:   Office  Primary Care Provider:  Christen Butter, NP Primary Cardiologist:  None  Chief Complaint / Patient Profile   76 y.o. y/o female with a h/o atypical chest pain, anxiety, dyspnea, GERD, hypertension, hypothyroidism/hyperthyroidism who is pending right knee arthroscopy 03/25/2022 and presents today for telephonic preoperative cardiovascular risk assessment.  She had a coronary CT scan September 2021 that showed clean coronary arteries with a calcium score of 0.  Last seen in the office 08/2021 by Dr. Jens Som.   Past Medical History    Past Medical History:  Diagnosis Date   Abnormal mammogram    Allergic rhinitis 10/30/2018   Allergy    Tetracycoline. Sulfa   Anxiety in acute stress reaction 10/30/2018   Arthritis    Atypical chest pain 10/30/2018   Cancer [facility])    breast    Cataract    Both eyes surgery   Chest pain    "not cardiac related"   Colon polyps    Dyspnea    DOE   Family history of breast cancer 07/05/2020   Family history of pancreatic cancer 07/05/2020   Family history of prostate cancer 07/05/2020   GERD (gastroesophageal reflux disease) 10/30/2018    History of kidney stones    passed   Hypertension 10/30/2018   Hyperthyroidism    Hypothyroidism 10/30/2018   Migraine 10/30/2018   Osteoporosis 10/30/2018   Personal history of chemotherapy    Personal history of radiation therapy    Pneumonia    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   Vitamin D deficiency 10/30/2018   Past Surgical History:  Procedure Laterality Date   ABDOMINAL HYSTERECTOMY     BREAST BIOPSY Left 06/26/2020   x2   BREAST LUMPECTOMY Left 08/15/2020   BREAST LUMPECTOMY WITH RADIOACTIVE SEED AND SENTINEL LYMPH NODE BIOPSY Left 08/15/2020   Procedure: LEFT BREAST LUMPECTOMY WITH RADIOACTIVE SEED AND SENTINEL LYMPH NODE BIOPSY;  Surgeon: Almond Lint, MD;  Location: [facility];  Service: General;  Laterality: Left;  RNFA   COLONOSCOPY W/ POLYPECTOMY     ESOPHAGEAL DILATION     EYE SURGERY Bilateral    catheter with lens   HYSTERECTOMY ABDOMINAL WITH SALPINGECTOMY     PORT-A-CATH REMOVAL  01/04/2021   Procedure: REMOVAL PORT-A-CATH;  Surgeon: Almond Lint, MD;  Location: [facility];  Service: General;;   PORTACATH PLACEMENT Right 08/15/2020   Procedure: INSERTION PORT-A-CATH;  Surgeon: Almond Lint, MD;  Location: [facility];  Service: General;  Laterality: Right;   TONSILLECTOMY     TUBAL LIGATION      Allergies  Allergies  Allergen Reactions   Sulfamethoxazole-Trimethoprim Rash   Tetracyclines & Related Rash    History of Present Illness    Cindy [name] is a 76  y.o. female who presents via audio/video conferencing for a telehealth visit today.  Pt was last seen in cardiology clinic on 08/13/2021 by Dr. Jens Som.  At that time Cindy [name] was doing well .  The patient is now pending procedure as outlined above. Since her last visit, she has had no new complaints from a cardiovascular standpoint.  She has not had any significant chest pain or shortness of breath.  She is relatively active.  She is able to walk 1-2 blocks on level  ground and up and down stairs.  She is somewhat limited due to her knee.  She is able to do light and moderate housework.  She also does a bit of yard work.  She is 5.62 METS on the DASI.  She is not on any blood thinners and should not need to stop any medications from our standpoint.   Home Medications    Prior to Admission medications   Medication Sig Start Date End Date Taking? Authorizing Provider  acetaminophen (TYLENOL) 500 MG tablet Take 1,000 mg by mouth every 6 (six) hours as needed for moderate pain.    [provider]  Calcium-Vitamin D-Vitamin K 500-100-40 MG-UNT-MCG CHEW Chew 2 tablets by mouth daily.    [provider]  cetirizine (ZYRTEC) 10 MG tablet Take 1 tablet (10 mg total) by mouth daily as needed for allergies. 12/11/21   Christen Butter, NP  denosumab (PROLIA) 60 MG/ML SOSY injection Inject 60 mg into the skin every 6 (six) months.    [provider]  diclofenac (VOLTAREN) 75 MG EC tablet Take 1 tablet (75 mg total) by mouth 2 (two) times daily. 10/12/21 10/12/22  Monica Becton, MD  escitalopram (LEXAPRO) 5 MG tablet Take 1 tablet (5 mg total) by mouth daily. 12/11/21   Christen Butter, NP  esomeprazole (NEXIUM) 40 MG capsule Take 1 capsule (40 mg total) by mouth daily. 12/11/21   Christen Butter, NP  fluticasone (FLONASE) 50 MCG/ACT nasal spray Place 2 sprays into both nostrils daily. Patient taking differently: Place 2 sprays into both nostrils daily. As needed. 06/12/21 06/12/22  Clayborne Dana, NP  hydrochlorothiazide (HYDRODIURIL) 25 MG tablet TAKE 1 TABLET BY MOUTH ONCE DAILY AS NEEDED SWELLING 12/11/21   Christen Butter, NP  latanoprost (XALATAN) 0.005 % ophthalmic solution  10/09/21   [provider]  levothyroxine (SYNTHROID) 75 MCG tablet Take 1 tablet (75 mcg total) by mouth daily before breakfast. 12/11/21   Christen Butter, NP  NIFEdipine (PROCARDIA-XL/NIFEDICAL-XL) 30 MG 24 hr tablet Take 1 tablet (30 mg total) by mouth daily. 12/11/21   Christen Butter,  NP  nitroGLYCERIN (NITROSTAT) 0.4 MG SL tablet Place 1 tablet (0.4 mg total) under the tongue every 5 (five) minutes as needed for chest pain. 06/12/21   Clayborne Dana, NP  Probiotic Product (PROBIOTIC PO) Take 1 capsule by mouth daily.    [provider]  rosuvastatin (CRESTOR) 20 MG tablet Take 1 tablet (20 mg total) by mouth daily. 06/12/21 03/01/22  Clayborne Dana, NP  SUMAtriptan (IMITREX) 100 MG tablet Take 0.5-1 tablets (50-100 mg total) by mouth every 2 (two) hours as needed for migraine (Max 2 pills per 24 hours). 06/12/21   Clayborne Dana, NP  timolol (TIMOPTIC) 0.5 % ophthalmic solution 1 drop 2 (two) times daily. 02/07/22   [provider]  vitamin B-12 (CYANOCOBALAMIN) 500 MCG tablet Take 500 mcg by mouth daily.     [provider]    Physical Exam  Vital Signs:  Cindy [name] does not have vital signs available for review today.  Given telephonic nature of communication, physical exam is limited. AAOx3. NAD. Normal affect.  Speech and respirations are unlabored.  Accessory Clinical Findings    None  Assessment & Plan    1.  Preoperative Cardiovascular Risk Assessment:  Cindy [name] perioperative risk of a major cardiac event is 0.4% according to the Revised Cardiac Risk Index (RCRI).  Therefore, she is at low risk for perioperative complications.   Her functional capacity is good at 5.62 METs according to the [facility] Activity Status Index (DASI). Recommendations: According to ACC/AHA guidelines, no further cardiovascular testing needed.  The patient may proceed to surgery at acceptable risk.   Antiplatelet and/or Anticoagulation Recommendations:  A copy of this note will be routed to requesting surgeon.  Time:   Today, I have spent 10 minutes with the patient with telehealth technology discussing medical history, symptoms, and management plan.     Sharlene Dory, PA-C  03/04/2022, 11:26 AM

## 2022-03-22 DIAGNOSIS — R3915 Urgency of urination: Secondary | ICD-10-CM | POA: Diagnosis not present

## 2022-03-22 DIAGNOSIS — R300 Dysuria: Secondary | ICD-10-CM | POA: Diagnosis not present

## 2022-03-22 DIAGNOSIS — N3001 Acute cystitis with hematuria: Secondary | ICD-10-CM | POA: Diagnosis not present

## 2022-03-22 DIAGNOSIS — N3080 Other cystitis without hematuria: Secondary | ICD-10-CM | POA: Diagnosis not present

## 2022-03-25 DIAGNOSIS — G8918 Other acute postprocedural pain: Secondary | ICD-10-CM | POA: Diagnosis not present

## 2022-03-25 DIAGNOSIS — M94261 Chondromalacia, right knee: Secondary | ICD-10-CM | POA: Diagnosis not present

## 2022-03-25 DIAGNOSIS — M6751 Plica syndrome, right knee: Secondary | ICD-10-CM | POA: Diagnosis not present

## 2022-03-25 DIAGNOSIS — Y999 Unspecified external cause status: Secondary | ICD-10-CM | POA: Diagnosis not present

## 2022-03-25 DIAGNOSIS — S83231A Complex tear of medial meniscus, current injury, right knee, initial encounter: Secondary | ICD-10-CM | POA: Diagnosis not present

## 2022-03-25 DIAGNOSIS — S83271A Complex tear of lateral meniscus, current injury, right knee, initial encounter: Secondary | ICD-10-CM | POA: Diagnosis not present

## 2022-03-25 DIAGNOSIS — X58XXXA Exposure to other specified factors, initial encounter: Secondary | ICD-10-CM | POA: Diagnosis not present

## 2022-04-02 DIAGNOSIS — S83241D Other tear of medial meniscus, current injury, right knee, subsequent encounter: Secondary | ICD-10-CM | POA: Diagnosis not present

## 2022-04-02 DIAGNOSIS — M1711 Unilateral primary osteoarthritis, right knee: Secondary | ICD-10-CM | POA: Diagnosis not present

## 2022-04-05 DIAGNOSIS — S83241D Other tear of medial meniscus, current injury, right knee, subsequent encounter: Secondary | ICD-10-CM | POA: Diagnosis not present

## 2022-04-05 DIAGNOSIS — M1711 Unilateral primary osteoarthritis, right knee: Secondary | ICD-10-CM | POA: Diagnosis not present

## 2022-04-09 DIAGNOSIS — H401133 Primary open-angle glaucoma, bilateral, severe stage: Secondary | ICD-10-CM | POA: Diagnosis not present

## 2022-04-09 DIAGNOSIS — H43813 Vitreous degeneration, bilateral: Secondary | ICD-10-CM | POA: Diagnosis not present

## 2022-04-10 DIAGNOSIS — S83241D Other tear of medial meniscus, current injury, right knee, subsequent encounter: Secondary | ICD-10-CM | POA: Diagnosis not present

## 2022-04-10 DIAGNOSIS — M1711 Unilateral primary osteoarthritis, right knee: Secondary | ICD-10-CM | POA: Diagnosis not present

## 2022-04-16 DIAGNOSIS — M1711 Unilateral primary osteoarthritis, right knee: Secondary | ICD-10-CM | POA: Diagnosis not present

## 2022-04-16 DIAGNOSIS — S83241D Other tear of medial meniscus, current injury, right knee, subsequent encounter: Secondary | ICD-10-CM | POA: Diagnosis not present

## 2022-04-18 DIAGNOSIS — M1711 Unilateral primary osteoarthritis, right knee: Secondary | ICD-10-CM | POA: Diagnosis not present

## 2022-04-18 DIAGNOSIS — S83241D Other tear of medial meniscus, current injury, right knee, subsequent encounter: Secondary | ICD-10-CM | POA: Diagnosis not present

## 2022-04-22 ENCOUNTER — Ambulatory Visit: Payer: Medicare Other | Attending: Radiation Oncology

## 2022-04-22 VITALS — Wt 145.5 lb

## 2022-04-22 DIAGNOSIS — Z483 Aftercare following surgery for neoplasm: Secondary | ICD-10-CM | POA: Insufficient documentation

## 2022-04-22 NOTE — Therapy (Signed)
OUTPATIENT PHYSICAL THERAPY SOZO SCREENING NOTE   Patient Name: Cindy [name] MRN: [id number] DOB:02/27/1946, 76 y.o., female Today's Date: 04/22/2022  PCP: Samuel Bouche, NP REFERRING PROVIDER: Samuel Bouche, NP   PT End of Session - 04/22/22 1043     Visit Number 6   # unchanged due to screen only   PT Start Time 1041    PT Stop Time 1045    PT Time Calculation (min) 4 min    Activity Tolerance Patient tolerated treatment well    Behavior During Therapy [facility] for tasks assessed/performed             Past Medical History:  Diagnosis Date   Abnormal mammogram    Allergic rhinitis 10/30/2018   Allergy    Tetracycoline. Sulfa   Anxiety in acute stress reaction 10/30/2018   Arthritis    Atypical chest pain 10/30/2018   Cancer [facility])    breast    Cataract    Both eyes surgery   Chest pain    "not cardiac related"   Colon polyps    Dyspnea    DOE   Family history of breast cancer 07/05/2020   Family history of pancreatic cancer 07/05/2020   Family history of prostate cancer 07/05/2020   GERD (gastroesophageal reflux disease) 10/30/2018   History of kidney stones    passed   Hypertension 10/30/2018   Hyperthyroidism    Hypothyroidism 10/30/2018   Migraine 10/30/2018   Osteoporosis 10/30/2018   Personal history of chemotherapy    Personal history of radiation therapy    Pneumonia    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   Vitamin D deficiency 10/30/2018   Past Surgical History:  Procedure Laterality Date   ABDOMINAL HYSTERECTOMY     BREAST BIOPSY Left 06/26/2020   x2   BREAST LUMPECTOMY Left 08/15/2020   BREAST LUMPECTOMY WITH RADIOACTIVE SEED AND SENTINEL LYMPH NODE BIOPSY Left 08/15/2020   Procedure: LEFT BREAST LUMPECTOMY WITH RADIOACTIVE SEED AND SENTINEL LYMPH NODE BIOPSY;  Surgeon: Stark Klein, MD;  Location: [place];  Service: General;  Laterality: Left;  RNFA   COLONOSCOPY W/ POLYPECTOMY     ESOPHAGEAL DILATION     EYE SURGERY Bilateral     catheter with lens   HYSTERECTOMY ABDOMINAL WITH SALPINGECTOMY     PORT-A-CATH REMOVAL  01/04/2021   Procedure: REMOVAL PORT-A-CATH;  Surgeon: Stark Klein, MD;  Location: [place];  Service: General;;   PORTACATH PLACEMENT Right 08/15/2020   Procedure: INSERTION PORT-A-CATH;  Surgeon: Stark Klein, MD;  Location: [place];  Service: General;  Laterality: Right;   TONSILLECTOMY     TUBAL LIGATION     Patient Active Problem List   Diagnosis Date Noted   Port-A-Cath in place 09/12/2020   Genetic testing 07/11/2020   Family history of breast cancer 07/05/2020   Family history of prostate cancer 07/05/2020   Malignant neoplasm of upper-outer quadrant of left breast in female, estrogen receptor negative ([place]) 06/30/2020   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/23/2019   Primary osteoarthritis of both hands 06/23/2019   Trigger thumb of left hand 06/23/2019   Polyarthralgia 06/23/2019   Hypertension 10/30/2018   Atypical chest pain 10/30/2018   Migraine 10/30/2018   Hypothyroidism 10/30/2018   Osteoporosis 10/30/2018   GERD (gastroesophageal reflux disease) 10/30/2018   Anxiety in acute stress reaction 10/30/2018   Allergic rhinitis 10/30/2018   Vitamin D deficiency 10/30/2018    REFERRING DIAG: left breast cancer at risk for lymphedema  THERAPY DIAG:  Aftercare  following surgery for neoplasm  PERTINENT HISTORY: Patient was diagnosed on 05/10/2020 with 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 She had left lumpectomy on 08/15/2020 wth 2 negative nodes removed. Ki67 is 80%. Radiation complete   PRECAUTIONS: left UE Lymphedema risk, None  SUBJECTIVE: Pt returns for her 3 month L-Dex screen.   PAIN:  Are you having pain? No  SOZO SCREENING: Patient was assessed today using the SOZO machine to determine the lymphedema index score. This was compared to her baseline score. It was determined that she is within the recommended range when  compared to her baseline and no further action is needed at this time. She will continue SOZO screenings. These are done every 3 months for 2 years post operatively followed by every 6 months for 2 years, and then annually.   L-DEX FLOWSHEETS - 04/22/22 1000       L-DEX LYMPHEDEMA SCREENING   Measurement Type Unilateral    L-DEX MEASUREMENT EXTREMITY Upper Extremity    POSITION  Standing    DOMINANT SIDE Right    At Risk Side Left    BASELINE SCORE (UNILATERAL) 3.9    L-DEX SCORE (UNILATERAL) 1.9    VALUE CHANGE (UNILAT) -2              [name], PTA 04/22/2022, 10:46 AM

## 2022-04-23 DIAGNOSIS — Z9889 Other specified postprocedural states: Secondary | ICD-10-CM | POA: Diagnosis not present

## 2022-04-23 DIAGNOSIS — S83241D Other tear of medial meniscus, current injury, right knee, subsequent encounter: Secondary | ICD-10-CM | POA: Diagnosis not present

## 2022-04-23 DIAGNOSIS — M25561 Pain in right knee: Secondary | ICD-10-CM | POA: Diagnosis not present

## 2022-04-23 DIAGNOSIS — M1711 Unilateral primary osteoarthritis, right knee: Secondary | ICD-10-CM | POA: Diagnosis not present

## 2022-04-25 DIAGNOSIS — M1711 Unilateral primary osteoarthritis, right knee: Secondary | ICD-10-CM | POA: Diagnosis not present

## 2022-04-25 DIAGNOSIS — S83241D Other tear of medial meniscus, current injury, right knee, subsequent encounter: Secondary | ICD-10-CM | POA: Diagnosis not present

## 2022-04-30 DIAGNOSIS — M1711 Unilateral primary osteoarthritis, right knee: Secondary | ICD-10-CM | POA: Diagnosis not present

## 2022-04-30 DIAGNOSIS — S83241D Other tear of medial meniscus, current injury, right knee, subsequent encounter: Secondary | ICD-10-CM | POA: Diagnosis not present

## 2022-05-02 DIAGNOSIS — S83241D Other tear of medial meniscus, current injury, right knee, subsequent encounter: Secondary | ICD-10-CM | POA: Diagnosis not present

## 2022-05-02 DIAGNOSIS — M1711 Unilateral primary osteoarthritis, right knee: Secondary | ICD-10-CM | POA: Diagnosis not present

## 2022-05-06 DIAGNOSIS — J069 Acute upper respiratory infection, unspecified: Secondary | ICD-10-CM | POA: Diagnosis not present

## 2022-05-06 DIAGNOSIS — H6981 Other specified disorders of Eustachian tube, right ear: Secondary | ICD-10-CM | POA: Diagnosis not present

## 2022-05-06 DIAGNOSIS — J208 Acute bronchitis due to other specified organisms: Secondary | ICD-10-CM | POA: Diagnosis not present

## 2022-05-06 DIAGNOSIS — Z20822 Contact with and (suspected) exposure to covid-19: Secondary | ICD-10-CM | POA: Diagnosis not present

## 2022-05-09 DIAGNOSIS — S83241D Other tear of medial meniscus, current injury, right knee, subsequent encounter: Secondary | ICD-10-CM | POA: Diagnosis not present

## 2022-05-09 DIAGNOSIS — M1711 Unilateral primary osteoarthritis, right knee: Secondary | ICD-10-CM | POA: Diagnosis not present

## 2022-05-14 DIAGNOSIS — H02536 Eyelid retraction left eye, unspecified eyelid: Secondary | ICD-10-CM | POA: Diagnosis not present

## 2022-05-14 DIAGNOSIS — H401133 Primary open-angle glaucoma, bilateral, severe stage: Secondary | ICD-10-CM | POA: Diagnosis not present

## 2022-05-14 DIAGNOSIS — H16223 Keratoconjunctivitis sicca, not specified as Sjogren's, bilateral: Secondary | ICD-10-CM | POA: Diagnosis not present

## 2022-05-14 DIAGNOSIS — H02533 Eyelid retraction right eye, unspecified eyelid: Secondary | ICD-10-CM | POA: Diagnosis not present

## 2022-05-15 DIAGNOSIS — S83241D Other tear of medial meniscus, current injury, right knee, subsequent encounter: Secondary | ICD-10-CM | POA: Diagnosis not present

## 2022-05-15 DIAGNOSIS — M1711 Unilateral primary osteoarthritis, right knee: Secondary | ICD-10-CM | POA: Diagnosis not present

## 2022-05-17 DIAGNOSIS — S83241D Other tear of medial meniscus, current injury, right knee, subsequent encounter: Secondary | ICD-10-CM | POA: Diagnosis not present

## 2022-05-17 DIAGNOSIS — M1711 Unilateral primary osteoarthritis, right knee: Secondary | ICD-10-CM | POA: Diagnosis not present

## 2022-05-20 ENCOUNTER — Ambulatory Visit (INDEPENDENT_AMBULATORY_CARE_PROVIDER_SITE_OTHER): Payer: Medicare Other | Admitting: Physician Assistant

## 2022-05-20 ENCOUNTER — Encounter: Payer: Self-pay | Admitting: Physician Assistant

## 2022-05-20 VITALS — BP 135/68 | HR 90 | Temp 97.5°F | Ht 64.0 in | Wt 147.0 lb

## 2022-05-20 DIAGNOSIS — B9689 Other specified bacterial agents as the cause of diseases classified elsewhere: Secondary | ICD-10-CM

## 2022-05-20 DIAGNOSIS — J208 Acute bronchitis due to other specified organisms: Secondary | ICD-10-CM

## 2022-05-20 MED ORDER — AZITHROMYCIN 250 MG PO TABS: ORAL_TABLET | ORAL | 0 refills | Status: DC

## 2022-05-20 NOTE — Patient Instructions (Signed)
  Acute Bronchitis, Adult  Acute bronchitis is when air tubes in the lungs (bronchi) suddenly get swollen. The condition can make it hard for you to breathe. In adults, acute bronchitis usually goes away within 2 weeks. A cough caused by bronchitis may last up to 3 weeks. Smoking, allergies, and asthma can make the condition worse. What are the causes? Germs that cause cold and flu (viruses). The most common cause of this condition is the virus that causes the common cold. Bacteria. Substances that bother (irritate) the lungs, including: Smoke from cigarettes and other types of tobacco. Dust and pollen. Fumes from chemicals, gases, or burned fuel. Indoor or outdoor air pollution. What increases the risk? A weak body's defense system. This is also called the immune system. Any condition that affects your lungs and breathing, such as asthma. What are the signs or symptoms? A cough. Coughing up clear, yellow, or green mucus. Making high-pitched whistling sounds when you breathe, most often when you breathe out (wheezing). Runny or stuffy nose. Having too much mucus in your lungs (chest congestion). Shortness of breath. Body aches. A sore throat. How is this treated? Acute bronchitis may go away over time without treatment. Your doctor may tell you to: Drink more fluids. This will help thin your mucus so it is easier to cough up. Use a device that gets medicine into your lungs (inhaler). Use a vaporizer or a humidifier. These are machines that add water to the air. This helps with coughing and poor breathing. Take a medicine that thins mucus and helps clear it from your lungs. Take a medicine that prevents or stops coughing. It is not common to take an antibiotic medicine for this condition. Follow these instructions at home:  Take over-the-counter and prescription medicines only as told by your doctor. Use an inhaler, vaporizer, or humidifier as told by your doctor. Take two  teaspoons (10 mL) of honey at bedtime. This helps lessen your coughing at night. Drink enough fluid to keep your pee (urine) pale yellow. Do not smoke or use any products that contain nicotine or tobacco. If you need help quitting, ask your doctor. Get a lot of rest. Return to your normal activities when your doctor says that it is safe. Keep all follow-up visits. How is this prevented?  Wash your hands often with soap and water for at least 20 seconds. If you cannot use soap and water, use hand sanitizer. Avoid contact with people who have cold symptoms. Try not to touch your mouth, nose, or eyes with your hands. Avoid breathing in smoke or chemical fumes. Make sure to get the flu shot every year. Contact a doctor if: Your symptoms do not get better in 2 weeks. You have trouble coughing up the mucus. Your cough keeps you awake at night. You have a fever. Get help right away if: You cough up blood. You have chest pain. You have very bad shortness of breath. You faint or keep feeling like you are going to faint. You have a very bad headache. Your fever or chills get worse. These symptoms may be an emergency. Get help right away. Call your local emergency services (911 in the [place] Do not wait to see if the symptoms will go away. Do not drive yourself to the hospital. Summary Acute bronchitis is when air tubes in the lungs (bronchi) suddenly get swollen. In adults, acute bronchitis usually goes away within 2 weeks. Drink more fluids. This will help thin your mucus so it   is easier to cough up. Take over-the-counter and prescription medicines only as told by your doctor. Contact a doctor if your symptoms do not improve after 2 weeks of treatment. This information is not intended to replace advice given to you by your health care provider. Make sure you discuss any questions you have with your health care provider. Document Revised: 12/27/2020 Document Reviewed: 12/27/2020 Elsevier  Patient Education  2023 Elsevier Inc.  

## 2022-05-20 NOTE — Progress Notes (Unsigned)
   Acute Office Visit  Subjective:     [name], female    DOB: 1946-01-01, 76 y.o.   MRN: [id number]  No chief complaint on file.   HPI Patient is a 76 y/o woman in today for productive cough with green sputum production and overall lack of energy. Pt was diagnosed with acute viral bronchitis on 8/28 and symptoms minimally improved on prednisone; however, today pt reports feeling worse. She denies a history of COPD/asthma, but has used an inhaler in the past for previous episodes of bronchitis. She has been taking Mucinex DM for symptom relief.   ROS Pt reports fatigue, productive cough, rhinorrhea, SOB, nausea, vomiting, right ear pain, but denies fever.      Objective:    BP 135/68   Pulse 90   Temp (!) 97.5 F (36.4 C) (Oral)   Ht '5\' 4"'$  (1.626 m)   Wt 66.7 kg   SpO2 96%   BMI 25.23 kg/m  {Vitals History (Optional):23777}  Physical Exam Fluid present behind right TM  Lungs CTAB  No results found for any visits on 05/20/22.      Assessment & Plan:   Problem List Items Addressed This Visit   None   Meds ordered this encounter  Medications   azithromycin (ZITHROMAX) 250 MG tablet    Sig: 2 tabs po x1 on Day 1, then 1 tab po daily on Days 2 - 5    Dispense:  6 tablet    Refill:  0    Order Specific Question:   Supervising Provider    Answer:   Beatrice Lecher D [[id number]]     No follow-ups on file.  Lorenso Quarry, Student-PA

## 2022-05-21 DIAGNOSIS — M1711 Unilateral primary osteoarthritis, right knee: Secondary | ICD-10-CM | POA: Diagnosis not present

## 2022-05-21 DIAGNOSIS — S83241D Other tear of medial meniscus, current injury, right knee, subsequent encounter: Secondary | ICD-10-CM | POA: Diagnosis not present

## 2022-05-23 DIAGNOSIS — S83241D Other tear of medial meniscus, current injury, right knee, subsequent encounter: Secondary | ICD-10-CM | POA: Diagnosis not present

## 2022-05-23 DIAGNOSIS — M1711 Unilateral primary osteoarthritis, right knee: Secondary | ICD-10-CM | POA: Diagnosis not present

## 2022-05-28 DIAGNOSIS — S83241D Other tear of medial meniscus, current injury, right knee, subsequent encounter: Secondary | ICD-10-CM | POA: Diagnosis not present

## 2022-05-28 DIAGNOSIS — M1711 Unilateral primary osteoarthritis, right knee: Secondary | ICD-10-CM | POA: Diagnosis not present

## 2022-05-30 DIAGNOSIS — S83241D Other tear of medial meniscus, current injury, right knee, subsequent encounter: Secondary | ICD-10-CM | POA: Diagnosis not present

## 2022-05-30 DIAGNOSIS — M1711 Unilateral primary osteoarthritis, right knee: Secondary | ICD-10-CM | POA: Diagnosis not present

## 2022-06-03 DIAGNOSIS — H401133 Primary open-angle glaucoma, bilateral, severe stage: Secondary | ICD-10-CM | POA: Diagnosis not present

## 2022-06-03 DIAGNOSIS — H40003 Preglaucoma, unspecified, bilateral: Secondary | ICD-10-CM | POA: Diagnosis not present

## 2022-06-03 DIAGNOSIS — Z961 Presence of intraocular lens: Secondary | ICD-10-CM | POA: Diagnosis not present

## 2022-06-03 DIAGNOSIS — Z79899 Other long term (current) drug therapy: Secondary | ICD-10-CM | POA: Diagnosis not present

## 2022-06-04 DIAGNOSIS — S83241D Other tear of medial meniscus, current injury, right knee, subsequent encounter: Secondary | ICD-10-CM | POA: Diagnosis not present

## 2022-06-04 DIAGNOSIS — M1711 Unilateral primary osteoarthritis, right knee: Secondary | ICD-10-CM | POA: Diagnosis not present

## 2022-06-07 ENCOUNTER — Encounter: Payer: Self-pay | Admitting: Medical-Surgical

## 2022-06-10 ENCOUNTER — Other Ambulatory Visit: Payer: Self-pay | Admitting: Adult Health

## 2022-06-10 DIAGNOSIS — Z1231 Encounter for screening mammogram for malignant neoplasm of breast: Secondary | ICD-10-CM

## 2022-06-11 DIAGNOSIS — S83241D Other tear of medial meniscus, current injury, right knee, subsequent encounter: Secondary | ICD-10-CM | POA: Diagnosis not present

## 2022-06-11 DIAGNOSIS — M1711 Unilateral primary osteoarthritis, right knee: Secondary | ICD-10-CM | POA: Diagnosis not present

## 2022-06-19 ENCOUNTER — Ambulatory Visit: Payer: Medicare Other | Admitting: Medical-Surgical

## 2022-07-02 DIAGNOSIS — M1711 Unilateral primary osteoarthritis, right knee: Secondary | ICD-10-CM | POA: Diagnosis not present

## 2022-07-02 DIAGNOSIS — M25561 Pain in right knee: Secondary | ICD-10-CM | POA: Diagnosis not present

## 2022-07-03 ENCOUNTER — Ambulatory Visit
Admission: RE | Admit: 2022-07-03 | Discharge: 2022-07-03 | Disposition: A | Discharge status: Home / Self Care | Payer: Medicare Other | Source: Ambulatory Visit | Attending: Adult Health | Admitting: Adult Health

## 2022-07-03 DIAGNOSIS — Z1231 Encounter for screening mammogram for malignant neoplasm of breast: Secondary | ICD-10-CM

## 2022-07-04 ENCOUNTER — Other Ambulatory Visit: Payer: Self-pay | Admitting: Adult Health

## 2022-07-04 DIAGNOSIS — Z9889 Other specified postprocedural states: Secondary | ICD-10-CM

## 2022-07-11 ENCOUNTER — Ambulatory Visit: Payer: Medicare Other | Admitting: Medical-Surgical

## 2022-07-12 ENCOUNTER — Telehealth: Payer: Self-pay

## 2022-07-12 NOTE — Telephone Encounter (Signed)
   Pre-operative Risk Assessment    Patient Name: Cindy [name]  DOB: [birth year] MRN: [id number]      Request for Surgical Clearance    Procedure:   Right Total Knee Arthoplasty  Date of Surgery:  Clearance TBD                                 Surgeon:  Dr Berenice Primas Surgeon's Group or Practice Name:  [facility]  Phone number:  [phone] Fax number:  [phone]   Type of Clearance Requested:   - Medical  along with latest office notes and labs if drawn within last 30 days     Type of Anesthesia:   Spinal   Additional requests/questions:  Please fax a copy of cardiac clearance to the surgeon's office.  Signed, Jeanmarie Plant Costella Schwarz  CCMA 07/12/2022, 4:19 PM

## 2022-07-12 NOTE — Telephone Encounter (Signed)
  Patient Consent for Virtual Visit         Cindy [name] has provided verbal consent on 07/12/2022 for a virtual visit (video or telephone).   CONSENT FOR VIRTUAL VISIT FOR:  Cindy [name]  By participating in this virtual visit I agree to the following:  I hereby voluntarily request, consent and authorize [facility] and its employed or contracted physicians, physician assistants, nurse practitioners or other licensed health care professionals (the Practitioner), to provide me with telemedicine health care services (the "Services") as deemed necessary by the treating Practitioner. I acknowledge and consent to receive the Services by the Practitioner via telemedicine. I understand that the telemedicine visit will involve communicating with the Practitioner through live audiovisual communication technology and the disclosure of certain medical information by electronic transmission. I acknowledge that I have been given the opportunity to request an in-person assessment or other available alternative prior to the telemedicine visit and am voluntarily participating in the telemedicine visit.  I understand that I have the right to withhold or withdraw my consent to the use of telemedicine in the course of my care at any time, without affecting my right to future care or treatment, and that the Practitioner or I may terminate the telemedicine visit at any time. I understand that I have the right to inspect all information obtained and/or recorded in the course of the telemedicine visit and may receive copies of available information for a reasonable fee.  I understand that some of the potential risks of receiving the Services via telemedicine include:  Delay or interruption in medical evaluation due to technological equipment failure or disruption; Information transmitted may not be sufficient (e.g. poor resolution of images) to allow for appropriate medical decision making by the  Practitioner; and/or  In rare instances, security protocols could fail, causing a breach of personal health information.  Furthermore, I acknowledge that it is my responsibility to provide information about my medical history, conditions and care that is complete and accurate to the best of my ability. I acknowledge that Practitioner's advice, recommendations, and/or decision may be based on factors not within their control, such as incomplete or inaccurate data provided by me or distortions of diagnostic images or specimens that may result from electronic transmissions. I understand that the practice of medicine is not an exact science and that Practitioner makes no warranties or guarantees regarding treatment outcomes. I acknowledge that a copy of this consent can be made available to me via my patient portal ([facility]), or I can request a printed copy by calling the office of [facility].    I understand that my insurance will be billed for this visit.   I have read or had this consent read to me. I understand the contents of this consent, which adequately explains the benefits and risks of the Services being provided via telemedicine.  I have been provided ample opportunity to ask questions regarding this consent and the Services and have had my questions answered to my satisfaction. I give my informed consent for the services to be provided through the use of telemedicine in my medical care

## 2022-07-12 NOTE — Telephone Encounter (Signed)
   Name: Cindy [name]  DOB: [birth year]  MRN: [id number]  Primary Cardiologist: None   Preoperative team, please contact this patient and set up a phone call appointment for further preoperative risk assessment. Please obtain consent and complete medication review. Thank you for your help.  I confirm that guidance regarding antiplatelet and oral anticoagulation therapy has been completed and, if necessary, noted below (none requested).    Lenna Sciara, NP 07/12/2022, 4:56 PM [place]

## 2022-07-12 NOTE — Telephone Encounter (Signed)
Pt is scheduled for a tele visit on 07/18/22. Med rec and consent done

## 2022-07-15 ENCOUNTER — Ambulatory Visit (INDEPENDENT_AMBULATORY_CARE_PROVIDER_SITE_OTHER): Payer: Medicare Other | Admitting: Physician Assistant

## 2022-07-15 ENCOUNTER — Encounter: Payer: Self-pay | Admitting: Physician Assistant

## 2022-07-15 VITALS — BP 139/81 | HR 71 | Ht 64.0 in | Wt 151.0 lb

## 2022-07-15 DIAGNOSIS — M1711 Unilateral primary osteoarthritis, right knee: Secondary | ICD-10-CM | POA: Diagnosis not present

## 2022-07-15 DIAGNOSIS — E039 Hypothyroidism, unspecified: Secondary | ICD-10-CM

## 2022-07-15 DIAGNOSIS — Z01818 Encounter for other preprocedural examination: Secondary | ICD-10-CM

## 2022-07-15 NOTE — Progress Notes (Unsigned)
Virtual Visit via Telephone Note   Because of Cindy [name] co-morbid illnesses, she is at least at moderate risk for complications without adequate follow up.  This format is felt to be most appropriate for this patient at this time.  The patient did not have access to video technology/had technical difficulties with video requiring transitioning to audio format only (telephone).  All issues noted in this document were discussed and addressed.  No physical exam could be performed with this format.  Please refer to the patient's chart for her consent to telehealth for [facility].  Evaluation Performed:  Preoperative cardiovascular risk assessment _____________   Date:  07/15/2022   Patient ID:  Cindy [name], DOB May 29, 1946, MRN [id number] Patient Location:  Home Provider location:   Office  Primary Care Provider:  Samuel Bouche, [name] Primary Cardiologist:  None  Chief Complaint / Patient Profile   76 y.o. y/o female with a h/o HTN, atypical chest pain, anxiety, GERD, coronary CTA with calcium score of 0, aortic atherosclerosis who is pending right total knee arthroplasty and presents today for telephonic preoperative cardiovascular risk assessment.  Past Medical History    Past Medical History:  Diagnosis Date   Abnormal mammogram    Allergic rhinitis 10/30/2018   Allergy    Tetracycoline. Sulfa   Anxiety in acute stress reaction 10/30/2018   Arthritis    Atypical chest pain 10/30/2018   Cancer [facility])    breast    Cataract    Both eyes surgery   Chest pain    "not cardiac related"   Colon polyps    Dyspnea    DOE   Family history of breast cancer 07/05/2020   Family history of pancreatic cancer 07/05/2020   Family history of prostate cancer 07/05/2020   GERD (gastroesophageal reflux disease) 10/30/2018   History of kidney stones    passed   Hypertension 10/30/2018   Hyperthyroidism    Hypothyroidism 10/30/2018   Migraine 10/30/2018   Osteoporosis  10/30/2018   Personal history of chemotherapy    Personal history of radiation therapy    Pneumonia    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   Vitamin D deficiency 10/30/2018   Past Surgical History:  Procedure Laterality Date   ABDOMINAL HYSTERECTOMY     BREAST BIOPSY Left 06/26/2020   x2   BREAST LUMPECTOMY Left 08/15/2020   BREAST LUMPECTOMY WITH RADIOACTIVE SEED AND SENTINEL LYMPH NODE BIOPSY Left 08/15/2020   Procedure: LEFT BREAST LUMPECTOMY WITH RADIOACTIVE SEED AND SENTINEL LYMPH NODE BIOPSY;  Surgeon: Cindy Klein, MD;  Location: [place];  Service: General;  Laterality: Left;  Cindy [name]   COLONOSCOPY W/ POLYPECTOMY     ESOPHAGEAL DILATION     EYE SURGERY Bilateral    catheter with lens   HYSTERECTOMY ABDOMINAL WITH SALPINGECTOMY     PORT-A-CATH REMOVAL  01/04/2021   Procedure: REMOVAL PORT-A-CATH;  Surgeon: Cindy Klein, MD;  Location: [place];  Service: General;;   PORTACATH PLACEMENT Right 08/15/2020   Procedure: INSERTION PORT-A-CATH;  Surgeon: Cindy Klein, MD;  Location: [place];  Service: General;  Laterality: Right;   TONSILLECTOMY     TUBAL LIGATION      Allergies  Allergies  Allergen Reactions   Sulfamethoxazole-Trimethoprim Rash   Tetracyclines & Related Rash    History of Present Illness    Cindy [name] is a 76 y.o. female who presents via audio/video conferencing for a telehealth visit today.  Pt was last seen in cardiology clinic on 08/13/21 by  Cindy [name].  At that time Cindy [name] was doing well.  The patient is now pending procedure as outlined above. Since her last visit, she *** denies chest pain, shortness of breath, lower extremity edema, fatigue, palpitations, melena, hematuria, hemoptysis, diaphoresis, weakness, presyncope, syncope, orthopnea, and PND.    Home Medications    Prior to Admission medications   Medication Sig Start Date End Date Taking? Authorizing Provider  acetaminophen (TYLENOL) 500 MG tablet  Take 1,000 mg by mouth every 6 (six) hours as needed for moderate pain.    [provider]  Calcium-Vitamin D-Vitamin K 500-100-40 MG-UNT-MCG CHEW Chew 2 tablets by mouth daily.    [provider]  cetirizine (ZYRTEC) 10 MG tablet Take 1 tablet (10 mg total) by mouth daily as needed for allergies. 12/11/21   Cindy [name]  denosumab (PROLIA) 60 MG/ML SOSY injection Inject 60 mg into the skin every 6 (six) months.    [provider]  dorzolamide-timolol (COSOPT) 2-0.5 % ophthalmic solution Place 1 drop into both eyes 2 (two) times daily. 06/03/22 06/03/23  [provider]  escitalopram (LEXAPRO) 5 MG tablet Take 1 tablet (5 mg total) by mouth daily. 12/11/21   Cindy [name]  esomeprazole (NEXIUM) 40 MG capsule Take 1 capsule (40 mg total) by mouth daily. 12/11/21   Cindy [name]  fluticasone (FLONASE) 50 MCG/ACT nasal spray Place 2 sprays into both nostrils daily. Patient taking differently: Place 2 sprays into both nostrils daily. As needed. 06/12/21 06/12/22  Cindy Saver, [name]  hydrochlorothiazide (HYDRODIURIL) 25 MG tablet TAKE 1 TABLET BY MOUTH ONCE DAILY AS NEEDED SWELLING 12/11/21   Cindy [name]  latanoprost (XALATAN) 0.005 % ophthalmic solution  10/09/21   [provider]  levothyroxine (SYNTHROID) 75 MCG tablet Take 1 tablet (75 mcg total) by mouth daily before breakfast. 12/11/21   Cindy [name]  NIFEdipine (PROCARDIA-XL/NIFEDICAL-XL) 30 MG 24 hr tablet Take 1 tablet (30 mg total) by mouth daily. 12/11/21   Cindy [name]  nitroGLYCERIN (NITROSTAT) 0.4 MG SL tablet Place 1 tablet (0.4 mg total) under the tongue every 5 (five) minutes as needed for chest pain. 06/12/21   Cindy Saver, [name]  Probiotic Product (PROBIOTIC PO) Take 1 capsule by mouth daily.    [provider]  rosuvastatin (CRESTOR) 20 MG tablet Take 1 tablet (20 mg total) by mouth daily. 06/12/21 03/01/22  Cindy Saver, [name]  SUMAtriptan (IMITREX) 100 MG tablet Take 0.5-1 tablets  (50-100 mg total) by mouth every 2 (two) hours as needed for migraine (Max 2 pills per 24 hours). 06/12/21   Cindy Saver, [name]  vitamin B-12 (CYANOCOBALAMIN) 500 MCG tablet Take 500 mcg by mouth daily.     [provider]    Physical Exam    Vital Signs:  Tashana Haberl does not have vital signs available for review today.***  Given telephonic nature of communication, physical exam is limited. AAOx3. NAD. Normal affect.  Speech and respirations are unlabored.  Accessory Clinical Findings    None  Assessment & Plan    1.  Preoperative Cardiovascular Risk Assessment:  The patient was advised that if she develops new symptoms prior to surgery to contact our office to arrange for a follow-up visit, and she verbalized understanding.  No cardiac medicines requested for hold.   A copy of this note will be routed to requesting surgeon.  Time:   Today, I have spent *** minutes with the patient with telehealth technology discussing  medical history, symptoms, and management plan.     Abagayle Klutts, Lanice Schwab, [name]  07/15/2022, 1:25 PM

## 2022-07-15 NOTE — Patient Instructions (Signed)
Continue with cardiac clearance.

## 2022-07-15 NOTE — Progress Notes (Signed)
Established Patient Office Visit  Subjective   [name], female    DOB: 11/05/1945  Age: 76 y.o. MRN: [id number]  No chief complaint on file.   HPI Pt is a 76 yo female who presents to the clinic for pre-op clearance.   She is having total right knee arthroplasty with Dr. Berenice Primas at [facility].   She has no complaints. She has cardiology clearance scheduled.   Needs thyroid medication refilled. No concerns.   Patient Active Problem List   Diagnosis Date Noted   Port-A-Cath in place 09/12/2020   Genetic testing 07/11/2020   Family history of breast cancer 07/05/2020   Family history of prostate cancer 07/05/2020   Malignant neoplasm of upper-outer quadrant of left breast in female, estrogen receptor negative ([place]) 06/30/2020   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/23/2019   Primary osteoarthritis of both hands 06/23/2019   Trigger thumb of left hand 06/23/2019   Polyarthralgia 06/23/2019   Hypertension 10/30/2018   Atypical chest pain 10/30/2018   Migraine 10/30/2018   Hypothyroidism 10/30/2018   Osteoporosis 10/30/2018   GERD (gastroesophageal reflux disease) 10/30/2018   Anxiety in acute stress reaction 10/30/2018   Allergic rhinitis 10/30/2018   Vitamin D deficiency 10/30/2018   Past Medical History:  Diagnosis Date   Abnormal mammogram    Allergic rhinitis 10/30/2018   Allergy    Tetracycoline. Sulfa   Anxiety in acute stress reaction 10/30/2018   Arthritis    Atypical chest pain 10/30/2018   Cancer [facility])    breast    Cataract    Both eyes surgery   Chest pain    "not cardiac related"   Colon polyps    Dyspnea    DOE   Family history of breast cancer 07/05/2020   Family history of pancreatic cancer 07/05/2020   Family history of prostate cancer 07/05/2020   GERD (gastroesophageal reflux disease) 10/30/2018   History of kidney stones    passed   Hypertension 10/30/2018   Hyperthyroidism    Hypothyroidism 10/30/2018    Migraine 10/30/2018   Osteoporosis 10/30/2018   Personal history of chemotherapy    Personal history of radiation therapy    Pneumonia    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   Vitamin D deficiency 10/30/2018   Past Surgical History:  Procedure Laterality Date   ABDOMINAL HYSTERECTOMY     BREAST BIOPSY Left 06/26/2020   x2   BREAST LUMPECTOMY Left 08/15/2020   BREAST LUMPECTOMY WITH RADIOACTIVE SEED AND SENTINEL LYMPH NODE BIOPSY Left 08/15/2020   Procedure: LEFT BREAST LUMPECTOMY WITH RADIOACTIVE SEED AND SENTINEL LYMPH NODE BIOPSY;  Surgeon: Stark Klein, MD;  Location: [place];  Service: General;  Laterality: Left;  RNFA   COLONOSCOPY W/ POLYPECTOMY     ESOPHAGEAL DILATION     EYE SURGERY Bilateral    catheter with lens   HYSTERECTOMY ABDOMINAL WITH SALPINGECTOMY     PORT-A-CATH REMOVAL  01/04/2021   Procedure: REMOVAL PORT-A-CATH;  Surgeon: Stark Klein, MD;  Location: [place];  Service: General;;   PORTACATH PLACEMENT Right 08/15/2020   Procedure: INSERTION PORT-A-CATH;  Surgeon: Stark Klein, MD;  Location: [facility];  Service: General;  Laterality: Right;   TONSILLECTOMY     TUBAL LIGATION     Family History  Problem Relation Age of Onset   High blood pressure Mother    Breast cancer Mother        dx before 85   Alzheimer's disease Mother    Cancer  Mother    Heart attack Father    Prostate cancer Brother 17   Breast cancer Maternal Aunt        dx 40s   Cancer Maternal Aunt    Breast cancer Maternal Aunt        dx 47s   Breast cancer Maternal Grandmother        dx after 49   Cancer Maternal Grandmother    Leukemia Paternal Grandfather        dx 20s-80s   Pancreatic cancer Cousin        maternal; dx 71s   Cancer Maternal Aunt 50       unknown type   Breast cancer Cousin        paternal; dx 29s   Cancer Maternal Aunt    Diabetes Daughter    Allergies  Allergen Reactions   Sulfamethoxazole-Trimethoprim Rash   Tetracyclines &  Related Rash      Review of Systems  All other systems reviewed and are negative.     Objective:     There were no vitals taken for this visit. BP Readings from Last 3 Encounters:  07/15/22 139/81  05/20/22 135/68  02/19/22 123/69   Wt Readings from Last 3 Encounters:  07/15/22 151 lb (68.5 kg)  05/20/22 147 lb (66.7 kg)  04/22/22 145 lb 8 oz (66 kg)      Physical Exam Vitals reviewed.  Constitutional:      Appearance: Normal appearance.  HENT:     Head: Normocephalic.  Cardiovascular:     Rate and Rhythm: Normal rate and regular rhythm.     Pulses: Normal pulses.     Heart sounds: Normal heart sounds.  Pulmonary:     Effort: Pulmonary effort is normal.     Breath sounds: Normal breath sounds.  Musculoskeletal:     Right lower leg: No edema.     Left lower leg: No edema.  Neurological:     General: No focal deficit present.     Mental Status: She is alert and oriented to person, place, and time.  Psychiatric:        Mood and Affect: Mood normal.        Assessment & Plan:  Cindy [name] was seen today for follow-up.  Diagnoses and all orders for this visit:  Preop examination -     CBC w/Diff/Platelet -     BASIC METABOLIC PANEL WITH GFR -     TSH  Primary osteoarthritis of right knee  Hypothyroidism, unspecified type -     TSH   No concerns on exam today, ok to proceed with surgery pending no abnormal labs.  Cbc/bmp/TSH ordered today Levothyroxine will be refilled accordingly Pt will need cardiology clearance separately since she follows with them for medication and work up for pre-cordial pain. Forms filled out for Dr. Berenice Primas and will be faxed once labs are received.   Declined flu shot today.  Iran Planas, PA-C

## 2022-07-16 ENCOUNTER — Other Ambulatory Visit: Payer: Self-pay | Admitting: Physician Assistant

## 2022-07-16 DIAGNOSIS — E039 Hypothyroidism, unspecified: Secondary | ICD-10-CM

## 2022-07-16 LAB — CBC WITH DIFFERENTIAL/PLATELET
Absolute Monocytes: 517 cells/uL (ref 200–950)
Basophils Absolute: 28 cells/uL (ref 0–200)
Basophils Relative: 0.6 %
Eosinophils Absolute: 80 cells/uL (ref 15–500)
Eosinophils Relative: 1.7 %
HCT: 41.7 % (ref 35.0–45.0)
Hemoglobin: 14.1 g/dL (ref 11.7–15.5)
Lymphs Abs: 1283 cells/uL (ref 850–3900)
MCH: 32 pg (ref 27.0–33.0)
MCHC: 33.8 g/dL (ref 32.0–36.0)
MCV: 94.6 fL (ref 80.0–100.0)
MPV: 9.6 fL (ref 7.5–12.5)
Monocytes Relative: 11 %
Neutro Abs: 2792 cells/uL (ref 1500–7800)
Neutrophils Relative %: 59.4 %
Platelets: 206 10*3/uL (ref 140–400)
RBC: 4.41 10*6/uL (ref 3.80–5.10)
RDW: 12.5 % (ref 11.0–15.0)
Total Lymphocyte: 27.3 %
WBC: 4.7 10*3/uL (ref 3.8–10.8)

## 2022-07-16 LAB — BASIC METABOLIC PANEL WITH GFR
BUN: 14 mg/dL (ref 7–25)
CO2: 28 mmol/L (ref 20–32)
Calcium: 9.4 mg/dL (ref 8.6–10.4)
Chloride: 107 mmol/L (ref 98–110)
Creat: 0.86 mg/dL (ref 0.60–1.00)
Glucose, Bld: 85 mg/dL (ref 65–99)
Potassium: 4.6 mmol/L (ref 3.5–5.3)
Sodium: 142 mmol/L (ref 135–146)
eGFR: 69 mL/min/{1.73_m2} (ref >=60)

## 2022-07-16 LAB — TSH: TSH: 2.13 mIU/L (ref 0.40–4.50)

## 2022-07-16 MED ORDER — LEVOTHYROXINE SODIUM 75 MCG PO TABS: 75.0000 ug | ORAL_TABLET | Freq: Every day | ORAL | 1 refills | Status: DC

## 2022-07-16 NOTE — Progress Notes (Signed)
Looks great for surgery!   JJ ok to send note and labs and paperwork for preop approval to Dr. Berenice Primas.

## 2022-07-18 ENCOUNTER — Encounter: Payer: Self-pay | Admitting: Nurse Practitioner

## 2022-07-18 ENCOUNTER — Ambulatory Visit: Payer: Medicare Other | Attending: Internal Medicine | Admitting: Nurse Practitioner

## 2022-07-18 DIAGNOSIS — Z01810 Encounter for preprocedural cardiovascular examination: Secondary | ICD-10-CM | POA: Diagnosis not present

## 2022-07-22 ENCOUNTER — Ambulatory Visit: Payer: Medicare Other | Attending: Radiation Oncology

## 2022-07-22 ENCOUNTER — Encounter: Payer: Self-pay | Admitting: Hematology and Oncology

## 2022-07-22 VITALS — Wt 150.2 lb

## 2022-07-22 DIAGNOSIS — Z483 Aftercare following surgery for neoplasm: Secondary | ICD-10-CM | POA: Insufficient documentation

## 2022-07-22 NOTE — Therapy (Signed)
OUTPATIENT PHYSICAL THERAPY SOZO SCREENING NOTE   Patient Name: Cindy [name] MRN: [id number] DOB:[birth year], 76 y.o., female Today's Date: 07/22/2022  PCP: Samuel Bouche, NP REFERRING PROVIDER: Eppie Gibson, MD   PT End of Session - 07/22/22 1033     Visit Number 6   # unchanged due to screen only   PT Start Time 1031    PT Stop Time 1036    PT Time Calculation (min) 5 min    Activity Tolerance Patient tolerated treatment well    Behavior During Therapy [facility] for tasks assessed/performed             Past Medical History:  Diagnosis Date   Abnormal mammogram    Allergic rhinitis 10/30/2018   Allergy    Tetracycoline. Sulfa   Anxiety in acute stress reaction 10/30/2018   Arthritis    Atypical chest pain 10/30/2018   Cancer [facility])    breast    Cataract    Both eyes surgery   Chest pain    "not cardiac related"   Colon polyps    Dyspnea    DOE   Family history of breast cancer 07/05/2020   Family history of pancreatic cancer 07/05/2020   Family history of prostate cancer 07/05/2020   GERD (gastroesophageal reflux disease) 10/30/2018   History of kidney stones    passed   Hypertension 10/30/2018   Hyperthyroidism    Hypothyroidism 10/30/2018   Migraine 10/30/2018   Osteoporosis 10/30/2018   Personal history of chemotherapy    Personal history of radiation therapy    Pneumonia    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   Vitamin D deficiency 10/30/2018   Past Surgical History:  Procedure Laterality Date   ABDOMINAL HYSTERECTOMY     BREAST BIOPSY Left 06/26/2020   x2   BREAST LUMPECTOMY Left 08/15/2020   BREAST LUMPECTOMY WITH RADIOACTIVE SEED AND SENTINEL LYMPH NODE BIOPSY Left 08/15/2020   Procedure: LEFT BREAST LUMPECTOMY WITH RADIOACTIVE SEED AND SENTINEL LYMPH NODE BIOPSY;  Surgeon: Stark Klein, MD;  Location: [place];  Service: General;  Laterality: Left;  RNFA   COLONOSCOPY W/ POLYPECTOMY     ESOPHAGEAL DILATION     EYE SURGERY Bilateral     catheter with lens   HYSTERECTOMY ABDOMINAL WITH SALPINGECTOMY     PORT-A-CATH REMOVAL  01/04/2021   Procedure: REMOVAL PORT-A-CATH;  Surgeon: Stark Klein, MD;  Location: [place];  Service: General;;   PORTACATH PLACEMENT Right 08/15/2020   Procedure: INSERTION PORT-A-CATH;  Surgeon: Stark Klein, MD;  Location: [place];  Service: General;  Laterality: Right;   TONSILLECTOMY     TUBAL LIGATION     Patient Active Problem List   Diagnosis Date Noted   Port-A-Cath in place 09/12/2020   Genetic testing 07/11/2020   Family history of breast cancer 07/05/2020   Family history of prostate cancer 07/05/2020   Malignant neoplasm of upper-outer quadrant of left breast in female, estrogen receptor negative ([place]) 06/30/2020   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/23/2019   Primary osteoarthritis of both hands 06/23/2019   Trigger thumb of left hand 06/23/2019   Polyarthralgia 06/23/2019   Hypertension 10/30/2018   Atypical chest pain 10/30/2018   Migraine 10/30/2018   Hypothyroidism 10/30/2018   Osteoporosis 10/30/2018   GERD (gastroesophageal reflux disease) 10/30/2018   Anxiety in acute stress reaction 10/30/2018   Allergic rhinitis 10/30/2018   Vitamin D deficiency 10/30/2018    REFERRING DIAG: left breast cancer at risk for lymphedema  THERAPY DIAG: Aftercare following  surgery for neoplasm  PERTINENT HISTORY: Patient was diagnosed on 05/10/2020 with 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 She had left lumpectomy on 08/15/2020 wth 2 negative nodes removed. Ki67 is 80%. Radiation complete   PRECAUTIONS: left UE Lymphedema risk, None  SUBJECTIVE: Pt returns for her 3 month L-Dex screen.   PAIN:  Are you having pain? No  SOZO SCREENING: Patient was assessed today using the SOZO machine to determine the lymphedema index score. This was compared to her baseline score. It was determined that she is within the recommended range when  compared to her baseline and no further action is needed at this time. She will continue SOZO screenings. These are done every 3 months for 2 years post operatively followed by every 6 months for 2 years, and then annually.   L-DEX FLOWSHEETS - 07/22/22 1000       L-DEX LYMPHEDEMA SCREENING   Measurement Type Unilateral    L-DEX MEASUREMENT EXTREMITY Upper Extremity    POSITION  Standing    DOMINANT SIDE Right    At Risk Side Left    BASELINE SCORE (UNILATERAL) 3.9    L-DEX SCORE (UNILATERAL) 5.9    VALUE CHANGE (UNILAT) 2              [name], PTA 07/22/2022, 10:36 AM

## 2022-07-25 ENCOUNTER — Encounter: Payer: Self-pay | Admitting: *Deleted

## 2022-07-25 NOTE — Progress Notes (Signed)
Received preoperative risk assessment from [place] for left total knee arthroplasty. Assessment filled out by MD with recommendations to hold Prolia injection 3 months post surgery.  Form successfully faxed to number provided [phone].

## 2022-07-28 DIAGNOSIS — R059 Cough, unspecified: Secondary | ICD-10-CM | POA: Diagnosis not present

## 2022-07-28 DIAGNOSIS — R0981 Nasal congestion: Secondary | ICD-10-CM | POA: Diagnosis not present

## 2022-07-28 DIAGNOSIS — J988 Other specified respiratory disorders: Secondary | ICD-10-CM | POA: Diagnosis not present

## 2022-07-28 DIAGNOSIS — R0989 Other specified symptoms and signs involving the circulatory and respiratory systems: Secondary | ICD-10-CM | POA: Diagnosis not present

## 2022-07-28 DIAGNOSIS — N3001 Acute cystitis with hematuria: Secondary | ICD-10-CM | POA: Diagnosis not present

## 2022-07-28 DIAGNOSIS — N3080 Other cystitis without hematuria: Secondary | ICD-10-CM | POA: Diagnosis not present

## 2022-07-28 DIAGNOSIS — Z1152 Encounter for screening for COVID-19: Secondary | ICD-10-CM | POA: Diagnosis not present

## 2022-07-28 DIAGNOSIS — R300 Dysuria: Secondary | ICD-10-CM | POA: Diagnosis not present

## 2022-07-28 DIAGNOSIS — Z20822 Contact with and (suspected) exposure to covid-19: Secondary | ICD-10-CM | POA: Diagnosis not present

## 2022-07-28 DIAGNOSIS — R350 Frequency of micturition: Secondary | ICD-10-CM | POA: Diagnosis not present

## 2022-07-28 DIAGNOSIS — B9789 Other viral agents as the cause of diseases classified elsewhere: Secondary | ICD-10-CM | POA: Diagnosis not present

## 2022-08-06 DIAGNOSIS — M1711 Unilateral primary osteoarthritis, right knee: Secondary | ICD-10-CM | POA: Diagnosis not present

## 2022-08-08 DIAGNOSIS — M1731 Unilateral post-traumatic osteoarthritis, right knee: Secondary | ICD-10-CM | POA: Diagnosis not present

## 2022-08-08 DIAGNOSIS — M25661 Stiffness of right knee, not elsewhere classified: Secondary | ICD-10-CM | POA: Diagnosis not present

## 2022-08-08 DIAGNOSIS — R262 Difficulty in walking, not elsewhere classified: Secondary | ICD-10-CM | POA: Diagnosis not present

## 2022-08-16 ENCOUNTER — Ambulatory Visit (INDEPENDENT_AMBULATORY_CARE_PROVIDER_SITE_OTHER): Payer: Medicare Other | Admitting: Medical-Surgical

## 2022-08-16 ENCOUNTER — Encounter: Payer: Self-pay | Admitting: Medical-Surgical

## 2022-08-16 VITALS — BP 133/81 | HR 89 | Temp 98.7°F | Resp 20 | Ht 64.0 in | Wt 148.0 lb

## 2022-08-16 DIAGNOSIS — R0989 Other specified symptoms and signs involving the circulatory and respiratory systems: Secondary | ICD-10-CM | POA: Diagnosis not present

## 2022-08-16 DIAGNOSIS — R051 Acute cough: Secondary | ICD-10-CM

## 2022-08-16 DIAGNOSIS — R0602 Shortness of breath: Secondary | ICD-10-CM | POA: Diagnosis not present

## 2022-08-16 LAB — POC COVID19 BINAXNOW: SARS Coronavirus 2 Ag: NEGATIVE

## 2022-08-16 LAB — POCT INFLUENZA A/B
Influenza A, POC: NEGATIVE
Influenza B, POC: NEGATIVE

## 2022-08-16 MED ORDER — BENZONATATE 200 MG PO CAPS: 200.0000 mg | ORAL_CAPSULE | Freq: Three times a day (TID) | ORAL | 0 refills | Status: DC | PRN

## 2022-08-16 MED ORDER — ALBUTEROL SULFATE HFA 108 (90 BASE) MCG/ACT IN AERS: 2.0000 | INHALATION_SPRAY | Freq: Four times a day (QID) | RESPIRATORY_TRACT | 11 refills | Status: DC | PRN

## 2022-08-16 MED ORDER — AZITHROMYCIN 250 MG PO TABS: ORAL_TABLET | ORAL | 0 refills | Status: AC

## 2022-08-16 MED ORDER — PREDNISONE 50 MG PO TABS: 50.0000 mg | ORAL_TABLET | Freq: Every day | ORAL | 0 refills | Status: DC

## 2022-08-16 NOTE — Progress Notes (Signed)
Established Patient Office Visit  Subjective   [name], female   DOB: [birth year] Age: 76 y.o. MRN: [id number]   Chief Complaint  Patient presents with   Cough   Headache   Shortness of Breath    HPI Pleasant 76 year old female presenting today with reports of a cough that has been present for the last 2 weeks.  Notes that at one point it was productive with yellow sputum.  She was seen in urgent care on 11/19 was diagnosed with acute cystitis as well as an upper respiratory infection.  She was provided with a prescription for Omnicef 300 mg twice daily for 10 days.  She has completed this course and notes that she no longer coughs up any secretions however the cough is still pretty prominent.  Over the last 2 days, she has developed chills, fever Tmax 100.6 F, significant fatigue, very poor appetite, headache, hoarseness, and stools that are looser than usual.  Notes that she does not have a right diarrhea but she does have some fecal incontinence with her coughing.  She is not eating or drinking much and feels that she may be getting dehydrated.  Denies sore throat, ears pain/pressure, and chest pain.  Endorses some shortness of breath.  Has an albuterol inhaler that she has been trying to use at home however it is expired.  Has also tried Mucinex which was not helpful.  Taking Tylenol as needed to help with fever and bodyaches.  She was previously scheduled to have knee surgery today however this had to be canceled due to her current illness.  The urgent care did not provide prednisone at her appointment because they were holding off due to the upcoming knee surgery.   Objective:    Vitals:   08/16/22 1533  BP: 133/81  Pulse: 89  Temp: 98.7 F (37.1 C)  Resp: 20  Height: '5\' 4"'$  (1.626 m)  Weight: 148 lb (67.1 kg)  SpO2: 94%  BMI (Calculated): 25.39    Physical Exam Vitals reviewed.  Constitutional:      General: She is not in acute distress.    Appearance:  Normal appearance. She is well-developed. She is ill-appearing.  HENT:     Head: Normocephalic and atraumatic.     Right Ear: Tympanic membrane, ear canal and external ear normal.     Left Ear: Tympanic membrane, ear canal and external ear normal.     Nose: Nose normal.  Eyes:     General: No scleral icterus.    Extraocular Movements: Extraocular movements intact.     Pupils: Pupils are equal, round, and reactive to light.  Cardiovascular:     Rate and Rhythm: Normal rate and regular rhythm.     Pulses: Normal pulses.     Heart sounds: Normal heart sounds.  Pulmonary:     Effort: Pulmonary effort is normal. No respiratory distress.     Breath sounds: Normal breath sounds. No wheezing, rhonchi or rales.  Musculoskeletal:     Cervical back: Normal range of motion and neck supple.  Lymphadenopathy:     Cervical: No cervical adenopathy.  Skin:    General: Skin is warm and dry.  Neurological:     Mental Status: She is alert and oriented to person, place, and time.  Psychiatric:        Mood and Affect: Mood normal.        Behavior: Behavior normal.        Thought Content: Thought content normal.  Judgment: Judgment normal.    Results for orders placed or performed in visit on 08/16/22 (from the past 24 hour(s))  POCT Influenza A/B     Status: None   Collection Time: 08/16/22  4:17 PM  Result Value Ref Range   Influenza A, POC Negative Negative   Influenza B, POC Negative Negative  POC COVID-19     Status: None   Collection Time: 08/16/22  4:17 PM  Result Value Ref Range   SARS Coronavirus 2 Ag Negative Negative       The ASCVD Risk score (Arnett DK, et al., 2019) failed to calculate for the following reasons:   Cannot find a previous HDL lab   Cannot find a previous total cholesterol lab   Assessment & Plan:   1. Acute cough 2. SOB (shortness of breath) POCT COVID and influenza testing negative. - POC COVID-19 - POCT Influenza A/B  3.  Respiratory  symptoms Despite negative testing for COVID and influenza, symptoms are very consistent with a viral upper respiratory infection.  Since she has had the cough for the last 2 weeks suspect she may have a bacterial bronchitis at this point.  Discussed various options.  Would like her to work on hydrating well over the next few days.  Starting prednisone 50 mg daily for 5 days.  Adding azithromycin.  Okay to use over-the-counter cold and flu preparations if desired.  Continue as needed Tylenol for aches and fever.  If no improvement in symptoms by Monday or Tuesday, return for further evaluation.  Return if symptoms worsen or fail to improve.  ___________________________________________ Clearnce Sorrel, DNP, APRN, FNP-BC [facility] [place]

## 2022-08-18 ENCOUNTER — Encounter: Payer: Self-pay | Admitting: Hematology and Oncology

## 2022-08-20 ENCOUNTER — Other Ambulatory Visit: Payer: Self-pay | Admitting: *Deleted

## 2022-08-20 ENCOUNTER — Encounter: Payer: Self-pay | Admitting: Medical-Surgical

## 2022-08-20 ENCOUNTER — Ambulatory Visit
Admission: RE | Admit: 2022-08-20 | Discharge: 2022-08-20 | Disposition: A | Discharge status: Home / Self Care | Payer: Medicare Other | Source: Ambulatory Visit | Attending: Adult Health | Admitting: Adult Health

## 2022-08-20 DIAGNOSIS — Z9889 Other specified postprocedural states: Secondary | ICD-10-CM

## 2022-08-20 DIAGNOSIS — Z853 Personal history of malignant neoplasm of breast: Secondary | ICD-10-CM | POA: Diagnosis not present

## 2022-08-20 DIAGNOSIS — Z171 Estrogen receptor negative status [ER-]: Secondary | ICD-10-CM

## 2022-08-20 DIAGNOSIS — R928 Other abnormal and inconclusive findings on diagnostic imaging of breast: Secondary | ICD-10-CM | POA: Diagnosis not present

## 2022-08-20 NOTE — Progress Notes (Signed)
Patient Care Team: Samuel Bouche, NP as PCP - General (Nurse Practitioner) Lelon Perla, MD as PCP - Cardiology (Cardiology) Stark Klein, MD as Consulting Physician (General Surgery) Nicholas Lose, MD as Consulting Physician (Hematology and Oncology) Eppie Gibson, MD as Attending Physician (Radiation Oncology)  DIAGNOSIS: No diagnosis found.  SUMMARY OF ONCOLOGIC HISTORY: Oncology History  Malignant neoplasm of upper-outer quadrant of left breast in female, estrogen receptor negative ([place])  06/30/2020 Initial Diagnosis   Screening mammogram showed a possible left breast mass. Diagnostic mammogram showed two adjacent masses at the 1 o'clock position, 0.7cm and 0.8cm, about 0.5cm apart, and spanning in total 1.8cm, no left axillary adenopathy. Biopsy showed IDC with squamous differentiation and DCIS, grade 3, HER-2 negative (1+), ER/PR negative, Ki67 80%.    07/05/2020 Cancer Staging   Staging form: Breast, AJCC 8th Edition - Clinical stage from 07/05/2020: Stage IB (cT1c, cN0, cM0, G3, ER-, PR-, HER2-) - Signed by Nicholas Lose, MD on 07/05/2020   07/11/2020 Genetic Testing   Negative genetic testing: no mutations detected in [facility] Common Hereditary Cancers Panel.  The report date is July 11, 2020.   The Common Hereditary Cancers Panel offered by [facility] includes sequencing and/or deletion duplication testing of the following 48 genes: APC, ATM, AXIN2, BARD1, BMPR1A, BRCA1, BRCA2, BRIP1, CDH1, CDK4, CDKN2A (p14ARF), CDKN2A (p16INK4a), CHEK2, CTNNA1, DICER1, EPCAM (Deletion/duplication testing only), GREM1 (promoter region deletion/duplication testing only), KIT, MEN1, MLH1, MSH2, MSH3, MSH6, MUTYH, NBN, NF1, NHTL1, PALB2, PDGFRA, PMS2, POLD1, POLE, PTEN, RAD50, RAD51C, RAD51D, RNF43, SDHB, SDHC, SDHD, SMAD4, SMARCA4. STK11, TP53, TSC1, TSC2, and VHL.  The following genes were evaluated for sequence changes only: SDHA and HOXB13 c.251G>A variant only.   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   08/15/2020 Cancer Staging   Staging form: Breast, AJCC 8th Edition - Pathologic stage from 08/15/2020: Stage IB (pT1c, pN0, cM0, G3, ER-, PR-, HER2-) - Signed by Gardenia Phlegm, NP on 05/17/2021 Stage prefix: Initial diagnosis Histologic grading system: 3 grade system   09/05/2020 - 12/21/2020 Chemotherapy   CMF x 6 cycles       01/18/2021 - 02/16/2021 Radiation Therapy   Adjuvant radiation therapy with Dr. Isidore Moos     CHIEF COMPLIANT:  Follow-up of left breast cancer    INTERVAL HISTORY: Cindy [name] is a 76 y.o. with above-mentioned history of left breast cancer who underwent a left lumpectomy, adjuvant chemotherapy, and is currently on radiation. She presents to the clinic today for follow-up.     ALLERGIES:  is allergic to sulfamethoxazole-trimethoprim and tetracyclines & related.  MEDICATIONS:  Current Outpatient Medications  Medication Sig Dispense Refill   acetaminophen (TYLENOL) 500 MG tablet Take 1,000 mg by mouth every 6 (six) hours as needed for moderate pain.     albuterol (VENTOLIN HFA) 108 (90 Base) MCG/ACT inhaler Inhale 2 puffs into the lungs every 6 (six) hours as needed for wheezing. 2 each 11   azithromycin (ZITHROMAX) 250 MG tablet Take 2 tablets on day 1, then 1 tablet daily on days 2 through 5 6 tablet 0   benzonatate (TESSALON) 200 MG capsule Take 1 capsule (200 mg total) by mouth 3 (three) times daily as needed for cough. 45 capsule 0   Calcium-Vitamin D-Vitamin K 500-100-40 MG-UNT-MCG CHEW Chew 2 tablets by mouth daily.     cetirizine (ZYRTEC) 10 MG tablet Take 1 tablet (10  mg total) by mouth daily as needed for allergies. 90 tablet 1   denosumab (PROLIA) 60 MG/ML SOSY injection Inject 60 mg into the skin every 6 (six) months.     dorzolamide-timolol (COSOPT) 2-0.5 % ophthalmic solution  Place 1 drop into both eyes 2 (two) times daily.     escitalopram (LEXAPRO) 5 MG tablet Take 1 tablet (5 mg total) by mouth daily. 90 tablet 1   esomeprazole (NEXIUM) 40 MG capsule Take 1 capsule (40 mg total) by mouth daily. 90 capsule 1   fluticasone (FLONASE) 50 MCG/ACT nasal spray Place 2 sprays into both nostrils daily. (Patient taking differently: Place 2 sprays into both nostrils daily. As needed.) 1 g 2   hydrochlorothiazide (HYDRODIURIL) 25 MG tablet TAKE 1 TABLET BY MOUTH ONCE DAILY AS NEEDED SWELLING 90 tablet 1   latanoprost (XALATAN) 0.005 % ophthalmic solution      levothyroxine (SYNTHROID) 75 MCG tablet Take 1 tablet (75 mcg total) by mouth daily before breakfast. 90 tablet 1   NIFEdipine (PROCARDIA-XL/NIFEDICAL-XL) 30 MG 24 hr tablet Take 1 tablet (30 mg total) by mouth daily. 90 tablet 1   nitroGLYCERIN (NITROSTAT) 0.4 MG SL tablet Place 1 tablet (0.4 mg total) under the tongue every 5 (five) minutes as needed for chest pain. 20 tablet 1   predniSONE (DELTASONE) 50 MG tablet Take 1 tablet (50 mg total) by mouth daily. 5 tablet 0   Probiotic Product (PROBIOTIC PO) Take 1 capsule by mouth daily.     rosuvastatin (CRESTOR) 20 MG tablet Take 1 tablet (20 mg total) by mouth daily. 90 tablet 1   SUMAtriptan (IMITREX) 100 MG tablet Take 0.5-1 tablets (50-100 mg total) by mouth every 2 (two) hours as needed for migraine (Max 2 pills per 24 hours). 10 tablet 3   vitamin B-12 (CYANOCOBALAMIN) 500 MCG tablet Take 500 mcg by mouth daily.      No current facility-administered medications for this visit.    PHYSICAL EXAMINATION: ECOG PERFORMANCE STATUS: {CHL ONC ECOG PS:[phone]}  There were no vitals filed for this visit. There were no vitals filed for this visit.  BREAST:*** No palpable masses or nodules in either right or left breasts. No palpable axillary supraclavicular or infraclavicular adenopathy no breast tenderness or nipple discharge. (exam performed in the presence of a  chaperone)  LABORATORY DATA:  I have reviewed the data as listed    Latest Ref Rng & Units 07/15/2022   12:00 AM 02/19/2022    9:32 AM 12/24/2021   12:00 AM  CMP  Glucose 65 - 99 mg/dL 85  75  100   BUN 7 - 25 mg/dL _0 Creatinine 0.60 - 1.00 mg/dL 0.86  0.98  1.07   Sodium 135 - 146 mmol/L 142  141  143   Potassium 3.5 - 5.3 mmol/L 4.6  4.5  5.4   Chloride 98 - 110 mmol/L 107  106  105   CO2 20 - 32 mmol/L _1 Calcium 8.6 - 10.4 mg/dL 9.4  9.7  9.8   Total Protein 6.5 - 8.1 g/dL  6.8  6.6   Total Bilirubin 0.3 - 1.2 mg/dL  0.5  0.8   Alkaline Phos 38 - 126 U/L  77    AST 15 - 41 U/L  24  30   ALT 0 - 44 U/L  20  27     Lab Results  Component Value Date   WBC 4.7  07/15/2022   HGB 14.1 07/15/2022   HCT 41.7 07/15/2022   MCV 94.6 07/15/2022   PLT 206 07/15/2022   NEUTROABS 2,792 07/15/2022    ASSESSMENT & PLAN:  No problem-specific Assessment & Plan notes found for this encounter.    No orders of the defined types were placed in this encounter.  The patient has a good understanding of the overall plan. she agrees with it. she will call with any problems that may develop before the next visit here. Total time spent: 30 mins including face to face time and time spent for planning, charting and co-ordination of care   Suzzette Righter, [place] 08/20/22    I Sabeen Piechocki, Shaynna Husby am acting as a Education administrator for [employer]  ***

## 2022-08-21 ENCOUNTER — Inpatient Hospital Stay: Payer: Medicare Other

## 2022-08-21 ENCOUNTER — Inpatient Hospital Stay (HOSPITAL_BASED_OUTPATIENT_CLINIC_OR_DEPARTMENT_OTHER): Payer: Medicare Other | Admitting: Hematology and Oncology

## 2022-08-21 ENCOUNTER — Inpatient Hospital Stay: Payer: Medicare Other | Attending: Hematology and Oncology

## 2022-08-21 VITALS — BP 115/74 | HR 88 | Temp 97.8°F | Resp 18 | Ht 64.0 in | Wt 145.9 lb

## 2022-08-21 DIAGNOSIS — Z79899 Other long term (current) drug therapy: Secondary | ICD-10-CM | POA: Diagnosis not present

## 2022-08-21 DIAGNOSIS — C50412 Malignant neoplasm of upper-outer quadrant of left female breast: Secondary | ICD-10-CM | POA: Diagnosis not present

## 2022-08-21 DIAGNOSIS — Z9221 Personal history of antineoplastic chemotherapy: Secondary | ICD-10-CM | POA: Insufficient documentation

## 2022-08-21 DIAGNOSIS — Z171 Estrogen receptor negative status [ER-]: Secondary | ICD-10-CM | POA: Insufficient documentation

## 2022-08-21 DIAGNOSIS — Z923 Personal history of irradiation: Secondary | ICD-10-CM | POA: Insufficient documentation

## 2022-08-21 LAB — CMP (CANCER CENTER ONLY)
ALT: 15 U/L (ref 0–44)
AST: 16 U/L (ref 15–41)
Albumin: 3.7 g/dL (ref 3.5–5.0)
Alkaline Phosphatase: 69 U/L (ref 38–126)
Anion gap: 5 (ref 5–15)
BUN: 12 mg/dL (ref 8–23)
CO2: 32 mmol/L (ref 22–32)
Calcium: 9.1 mg/dL (ref 8.9–10.3)
Chloride: 104 mmol/L (ref 98–111)
Creatinine: 0.8 mg/dL (ref 0.44–1.00)
GFR, Estimated: >60 mL/min (ref >60)
Glucose, Bld: 87 mg/dL (ref 70–99)
Potassium: 3.4 mmol/L — ABNORMAL LOW (ref 3.5–5.1)
Sodium: 141 mmol/L (ref 135–145)
Total Bilirubin: 0.6 mg/dL (ref 0.3–1.2)
Total Protein: 6.2 g/dL — ABNORMAL LOW (ref 6.5–8.1)

## 2022-08-21 LAB — CBC WITH DIFFERENTIAL (CANCER CENTER ONLY)
Abs Immature Granulocytes: 0.05 10*3/uL (ref 0.00–0.07)
Basophils Absolute: 0 10*3/uL (ref 0.0–0.1)
Basophils Relative: 0 %
Eosinophils Absolute: 0.1 10*3/uL (ref 0.0–0.5)
Eosinophils Relative: 1 %
HCT: 40.9 % (ref 36.0–46.0)
Hemoglobin: 13.7 g/dL (ref 12.0–15.0)
Immature Granulocytes: 1 %
Lymphocytes Relative: 31 %
Lymphs Abs: 1.7 10*3/uL (ref 0.7–4.0)
MCH: 31.4 pg (ref 26.0–34.0)
MCHC: 33.5 g/dL (ref 30.0–36.0)
MCV: 93.6 fL (ref 80.0–100.0)
Monocytes Absolute: 0.8 10*3/uL (ref 0.1–1.0)
Monocytes Relative: 14 %
Neutro Abs: 2.8 10*3/uL (ref 1.7–7.7)
Neutrophils Relative %: 53 %
Platelet Count: 253 10*3/uL (ref 150–400)
RBC: 4.37 MIL/uL (ref 3.87–5.11)
RDW: 12.4 % (ref 11.5–15.5)
WBC Count: 5.4 10*3/uL (ref 4.0–10.5)
nRBC: 0 % (ref 0.0–0.2)

## 2022-08-21 NOTE — Assessment & Plan Note (Signed)
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  ER/PR negative, Ki-67 80%, HER-2 negative   Treatment plan: 1. adjuvant chemotherapy with CMF x6 cycles  09/05/2020- 12/19/20 2.  Follow-up adjuvant radiation 01/19/21-02/22/21 ------------------------------------------------------------------------------------------------------------------------------------------------------  She received Prolia injection today for osteoporosis.  Breast cancer surveillance: Breast exam 08/21/2022: Benign Mammogram 08/20/2022: Benign breast density category B  We will set her up for every 75-monthProlia injections.  I will see her back in 1 year.

## 2022-08-27 ENCOUNTER — Encounter: Payer: Self-pay | Admitting: Hematology and Oncology

## 2022-09-07 ENCOUNTER — Other Ambulatory Visit: Payer: Self-pay | Admitting: Medical-Surgical

## 2022-09-07 DIAGNOSIS — K219 Gastro-esophageal reflux disease without esophagitis: Secondary | ICD-10-CM

## 2022-09-11 DIAGNOSIS — G8918 Other acute postprocedural pain: Secondary | ICD-10-CM | POA: Diagnosis not present

## 2022-09-11 DIAGNOSIS — M1711 Unilateral primary osteoarthritis, right knee: Secondary | ICD-10-CM | POA: Diagnosis not present

## 2022-09-11 DIAGNOSIS — Z96651 Presence of right artificial knee joint: Secondary | ICD-10-CM | POA: Diagnosis not present

## 2022-09-13 DIAGNOSIS — M6281 Muscle weakness (generalized): Secondary | ICD-10-CM | POA: Diagnosis not present

## 2022-09-13 DIAGNOSIS — Z96651 Presence of right artificial knee joint: Secondary | ICD-10-CM | POA: Diagnosis not present

## 2022-09-13 DIAGNOSIS — M25661 Stiffness of right knee, not elsewhere classified: Secondary | ICD-10-CM | POA: Diagnosis not present

## 2022-09-13 HISTORY — PX: KNEE SURGERY: SHX244

## 2022-09-16 DIAGNOSIS — Z882 Allergy status to sulfonamides status: Secondary | ICD-10-CM | POA: Diagnosis not present

## 2022-09-16 DIAGNOSIS — H47393 Other disorders of optic disc, bilateral: Secondary | ICD-10-CM | POA: Diagnosis not present

## 2022-09-16 DIAGNOSIS — Z961 Presence of intraocular lens: Secondary | ICD-10-CM | POA: Diagnosis not present

## 2022-09-16 DIAGNOSIS — H3122 Choroidal dystrophy (central areolar) (generalized) (peripapillary): Secondary | ICD-10-CM | POA: Diagnosis not present

## 2022-09-16 DIAGNOSIS — H02403 Unspecified ptosis of bilateral eyelids: Secondary | ICD-10-CM | POA: Diagnosis not present

## 2022-09-16 DIAGNOSIS — H401133 Primary open-angle glaucoma, bilateral, severe stage: Secondary | ICD-10-CM | POA: Diagnosis not present

## 2022-09-16 DIAGNOSIS — Z881 Allergy status to other antibiotic agents status: Secondary | ICD-10-CM | POA: Diagnosis not present

## 2022-09-16 DIAGNOSIS — H35432 Paving stone degeneration of retina, left eye: Secondary | ICD-10-CM | POA: Diagnosis not present

## 2022-09-18 DIAGNOSIS — M6281 Muscle weakness (generalized): Secondary | ICD-10-CM | POA: Diagnosis not present

## 2022-09-18 DIAGNOSIS — Z96651 Presence of right artificial knee joint: Secondary | ICD-10-CM | POA: Diagnosis not present

## 2022-09-18 DIAGNOSIS — M25661 Stiffness of right knee, not elsewhere classified: Secondary | ICD-10-CM | POA: Diagnosis not present

## 2022-09-20 ENCOUNTER — Ambulatory Visit (INDEPENDENT_AMBULATORY_CARE_PROVIDER_SITE_OTHER): Payer: Medicare Other | Admitting: Medical-Surgical

## 2022-09-20 DIAGNOSIS — Z96651 Presence of right artificial knee joint: Secondary | ICD-10-CM | POA: Diagnosis not present

## 2022-09-20 DIAGNOSIS — M6281 Muscle weakness (generalized): Secondary | ICD-10-CM | POA: Diagnosis not present

## 2022-09-20 DIAGNOSIS — Z0000 Encounter for general adult medical examination without abnormal findings: Secondary | ICD-10-CM | POA: Diagnosis not present

## 2022-09-20 DIAGNOSIS — M25661 Stiffness of right knee, not elsewhere classified: Secondary | ICD-10-CM | POA: Diagnosis not present

## 2022-09-20 NOTE — Progress Notes (Signed)
MEDICARE ANNUAL WELLNESS VISIT  09/20/2022  Telephone Visit Disclaimer This Medicare AWV was conducted by telephone due to national recommendations for restrictions regarding the COVID-19 Pandemic (e.g. social distancing).  I verified, using two identifiers, that I am speaking with Cindy [name] or their authorized healthcare agent. I discussed the limitations, risks, security, and privacy concerns of performing an evaluation and management service by telephone and the potential availability of an in-person appointment in the future. The patient expressed understanding and agreed to proceed.  Location of Patient: Home Location of Provider (nurse):  In the office.  Subjective:    Cindy [name] is a 77 y.o. female patient of Samuel Bouche, NP who had a Medicare Annual Wellness Visit today via telephone. Cindy [name] is Retired and lives with their spouse. she has 2 children. she reports that she is socially active and does interact with friends/family regularly. she is minimally physically active and enjoys water colors and gardening.  Patient Care Team: Samuel Bouche, NP as PCP - General (Nurse Practitioner) Lelon Perla, MD as PCP - Cardiology (Cardiology) Stark Klein, MD as Consulting Physician (General Surgery) Nicholas Lose, MD as Consulting Physician (Hematology and Oncology) Eppie Gibson, MD as Attending Physician (Radiation Oncology)     09/20/2022    8:46 AM 08/21/2022   10:10 AM 09/12/2021    9:15 AM 03/30/2021   11:47 AM 02/14/2021   11:12 AM 01/10/2021   10:11 AM 01/04/2021   10:44 AM  Advanced Directives  Does Patient Have a Medical Advance Directive? Yes Yes Yes Yes Yes Yes Yes  Type of Advance Directive Living will [place];Living will Living will;Healthcare Power of [place];Living will [place];Living will [place];Living will   Does patient want to make changes to medical  advance directive? No - Patient declined No - Patient declined No - Patient declined No - Patient declined No - Patient declined No - Patient declined No - Patient declined  Copy of [place] in Chart?  Yes - validated most recent copy scanned in chart (See row information) No - copy requested Yes - validated most recent copy scanned in chart (See row information)  Yes - validated most recent copy scanned in chart (See row information)     Hospital Utilization Over the Past 12 Months: # of hospitalizations or ER visits: 1 # of surgeries: 1  Review of Systems    Patient reports that her overall health is unchanged compared to last year.  History obtained from chart review and the patient  Patient Reported Readings (BP, Pulse, CBG, Weight, etc) none  Pain Assessment Pain : No/denies pain     Current Medications & Allergies (verified) Allergies as of 09/20/2022       Reactions   Sulfamethoxazole-trimethoprim Rash   Tetracyclines & Related Rash        Medication List        Accurate as of September 20, 2022  8:57 AM. If you have any questions, ask your nurse or doctor.          acetaminophen 500 MG tablet Commonly known as: TYLENOL Take 1,000 mg by mouth every 6 (six) hours as needed for moderate pain.   albuterol 108 (90 Base) MCG/ACT inhaler Commonly known as: VENTOLIN HFA Inhale 2 puffs into the lungs every 6 (six) hours as needed for wheezing.   Bayer Aspirin 325 MG tablet Generic drug: aspirin EC TAKE 1 TABLET BY MOUTH TWICE  DAILY WITH FOOD TO DECREASE RISK OF BLOOD CLOTS. TAKE 1 MONTH POST OP   Calcium-Vitamin D-Vitamin K 500-100-40 MG-UNT-MCG Chew Chew 2 tablets by mouth daily.   celecoxib 200 MG capsule Commonly known as: CELEBREX Take by mouth.   cetirizine 10 MG tablet Commonly known as: ZYRTEC Take 1 tablet (10 mg total) by mouth daily as needed for allergies.   denosumab 60 MG/ML Sosy injection Commonly known as: PROLIA Inject  60 mg into the skin every 6 (six) months.   dorzolamide-timolol 2-0.5 % ophthalmic solution Commonly known as: COSOPT Place 1 drop into both eyes 2 (two) times daily.   escitalopram 5 MG tablet Commonly known as: LEXAPRO Take 1 tablet (5 mg total) by mouth daily.   esomeprazole 40 MG capsule Commonly known as: NEXIUM Take 1 capsule by mouth once daily   fluticasone 50 MCG/ACT nasal spray Commonly known as: Flonase Place 2 sprays into both nostrils daily. What changed: additional instructions   hydrochlorothiazide 25 MG tablet Commonly known as: HYDRODIURIL TAKE 1 TABLET BY MOUTH ONCE DAILY AS NEEDED SWELLING   latanoprost 0.005 % ophthalmic solution Commonly known as: XALATAN   levothyroxine 75 MCG tablet Commonly known as: SYNTHROID Take 1 tablet (75 mcg total) by mouth daily before breakfast.   NIFEdipine 30 MG 24 hr tablet Commonly known as: PROCARDIA-XL/NIFEDICAL-XL Take 1 tablet (30 mg total) by mouth daily.   nitroGLYCERIN 0.4 MG SL tablet Commonly known as: NITROSTAT Place 1 tablet (0.4 mg total) under the tongue every 5 (five) minutes as needed for chest pain.   oxyCODONE 5 MG immediate release tablet Commonly known as: Oxy IR/ROXICODONE Take by mouth.   predniSONE 50 MG tablet Commonly known as: DELTASONE Take 1 tablet (50 mg total) by mouth daily.   PROBIOTIC PO Take 1 capsule by mouth daily.   rosuvastatin 20 MG tablet Commonly known as: CRESTOR Take 1 tablet (20 mg total) by mouth daily.   SUMAtriptan 100 MG tablet Commonly known as: IMITREX Take 0.5-1 tablets (50-100 mg total) by mouth every 2 (two) hours as needed for migraine (Max 2 pills per 24 hours).   tiZANidine 4 MG tablet Commonly known as: ZANAFLEX Take by mouth.   vitamin B-12 500 MCG tablet Commonly known as: CYANOCOBALAMIN Take 500 mcg by mouth daily.        History (reviewed): Past Medical History:  Diagnosis Date   Abnormal mammogram    Allergic rhinitis 10/30/2018    Allergy    Tetracycoline. Sulfa   Anxiety in acute stress reaction 10/30/2018   Arthritis    Atypical chest pain 10/30/2018   Cancer [facility])    breast    Cataract    Both eyes surgery   Chest pain    "not cardiac related"   Colon polyps    Dyspnea    DOE   Family history of breast cancer 07/05/2020   Family history of pancreatic cancer 07/05/2020   Family history of prostate cancer 07/05/2020   Genetic testing 07/11/2020   GERD (gastroesophageal reflux disease) 10/30/2018   History of kidney stones    passed   Hypertension 10/30/2018   Hyperthyroidism    Hypothyroidism 10/30/2018   Migraine 10/30/2018   Osteoporosis 10/30/2018   Personal history of chemotherapy    Personal history of radiation therapy    Pneumonia    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   Vitamin D deficiency 10/30/2018   Past Surgical History:  Procedure Laterality Date   ABDOMINAL HYSTERECTOMY     BREAST  BIOPSY Left 06/26/2020   x2   BREAST LUMPECTOMY Left 08/15/2020   BREAST LUMPECTOMY WITH RADIOACTIVE SEED AND SENTINEL LYMPH NODE BIOPSY Left 08/15/2020   Procedure: LEFT BREAST LUMPECTOMY WITH RADIOACTIVE SEED AND SENTINEL LYMPH NODE BIOPSY;  Surgeon: Stark Klein, MD;  Location: [facility];  Service: General;  Laterality: Left;  RNFA   COLONOSCOPY W/ POLYPECTOMY     ESOPHAGEAL DILATION     EYE SURGERY Bilateral    catheter with lens   HYSTERECTOMY ABDOMINAL WITH SALPINGECTOMY     KNEE SURGERY Right 09/13/2022   PORT-A-CATH REMOVAL  01/04/2021   Procedure: REMOVAL PORT-A-CATH;  Surgeon: Stark Klein, MD;  Location: [facility];  Service: General;;   PORTACATH PLACEMENT Right 08/15/2020   Procedure: INSERTION PORT-A-CATH;  Surgeon: Stark Klein, MD;  Location: [facility];  Service: General;  Laterality: Right;   TONSILLECTOMY     TUBAL LIGATION     Family History  Problem Relation Age of Onset   High blood pressure Mother    Breast cancer Mother        dx before 33    Alzheimer's disease Mother    Cancer Mother    Heart attack Father    Prostate cancer Brother 64   Breast cancer Maternal Aunt        dx 73s   Cancer Maternal Aunt    Breast cancer Maternal Aunt        dx 76s   Breast cancer Maternal Grandmother        dx after 46   Cancer Maternal Grandmother    Leukemia Paternal Grandfather        dx 53s-80s   Pancreatic cancer Cousin        maternal; dx 33s   Cancer Maternal Aunt 50       unknown type   Breast cancer Cousin        paternal; dx 38s   Cancer Maternal Aunt    Diabetes Daughter    Social History   Socioeconomic History   Marital status: Married    Spouse name: Verdia Kuba "Cindy [name]"   Number of children: 2   Years of education: 15   Highest education level: Some college, no degree  Occupational History   Occupation: Retired.  Tobacco Use   Smoking status: Never   Smokeless tobacco: Never  Vaping Use   Vaping Use: Never used  Substance and Sexual Activity   Alcohol use: Not Currently    Comment: rarely   Drug use: Never   Sexual activity: Not Currently    Partners: Male    Birth control/protection: None  Other Topics Concern   Not on file  Social History Narrative   Lives with her husband. She enjoys water colors and gardening.   Social Determinants of Health   Financial Resource Strain: Low Risk  (09/20/2022)   Overall Financial Resource Strain (CARDIA)    Difficulty of Paying Living Expenses: Not hard at all  Food Insecurity: No Food Insecurity (09/20/2022)   Hunger Vital Sign    Worried About Running Out of Food in the Last Year: Never true    Ran Out of Food in the Last Year: Never true  Transportation Needs: No Transportation Needs (09/20/2022)   PRAPARE - Hydrologist (Medical): No    Lack of Transportation (Non-Medical): No  Physical Activity: Sufficiently Active (09/20/2022)   Exercise Vital Sign    Days of Exercise per Week: 3 days    Minutes  of Exercise per Session: 60 min   Stress: No Stress Concern Present (09/20/2022)   [place]    Feeling of Stress : Not at all  Social Connections: Moderately Isolated (09/20/2022)   Social Connection and Isolation Panel [NHANES]    Frequency of Communication with Friends and Family: Three times a week    Frequency of Social Gatherings with Friends and Family: Once a week    Attends Religious Services: Never    Marine scientist or Organizations: No    Attends Archivist Meetings: Never    Marital Status: Married    Activities of Daily Living    09/20/2022    8:49 AM  In your present state of health, do you have any difficulty performing the following activities:  Hearing? 0  Vision? 0  Difficulty concentrating or making decisions? 0  Walking or climbing stairs? 1  Comment due to recent surgery  Dressing or bathing? 1  Comment due to surgery some difficulty  Doing errands, shopping? 0  Preparing Food and eating ? N  Using the Toilet? N  In the past six months, have you accidently leaked urine? N  Do you have problems with loss of bowel control? N  Managing your Medications? N  Managing your Finances? N  Housekeeping or managing your Housekeeping? N    Patient Education/ Literacy How often do you need to have someone help you when you read instructions, pamphlets, or other written materials from your doctor or pharmacy?: 1 - Never What is the last grade level you completed in school?: 2 years of college  Exercise Current Exercise Habits: Structured exercise class, Type of exercise: Other - see comments (physical therapy), Time (Minutes): 60, Frequency (Times/Week): 3, Weekly Exercise (Minutes/Week): 180, Intensity: Moderate, Exercise limited by: orthopedic condition(s)  Diet Patient reports consuming  2-3  meals a day and 1 snack(s) a day Patient reports that her primary diet is: Regular Patient reports that she does have  regular access to food.   Depression Screen    09/20/2022    8:46 AM 08/16/2022    3:36 PM 07/15/2022   10:25 AM 09/12/2021    9:15 AM 06/12/2021    9:16 AM 03/01/2019    9:55 AM 10/30/2018   11:08 AM  PHQ 2/9 Scores  PHQ - 2 Score 0 0 0 0 0 0 0  PHQ- 9 Score      2 1     Fall Risk    09/20/2022    8:46 AM 07/15/2022   10:25 AM 12/11/2021    9:51 AM 09/12/2021    9:15 AM 09/11/2021    7:28 PM  Fall Risk   Falls in the past year? 0 1 0 0 0  Number falls in past yr: 0 0 0 0   Injury with Fall? 0 0 0 0   Risk for fall due to : No Fall Risks No Fall Risks No Fall Risks No Fall Risks   Follow up Falls evaluation completed Falls evaluation completed Falls evaluation completed Falls evaluation completed      Objective:  Cindy [name] seemed alert and oriented and she participated appropriately during our telephone visit.  Blood Pressure Weight BMI  BP Readings from Last 3 Encounters:  08/21/22 115/74  08/16/22 133/81  07/15/22 139/81   Wt Readings from Last 3 Encounters:  08/21/22 145 lb 14.4 oz (66.2 kg)  08/16/22 148 lb (67.1 kg)  07/22/22 150  lb 4 oz (68.2 kg)   BMI Readings from Last 1 Encounters:  08/21/22 25.04 kg/m    *Unable to obtain current vital signs, weight, and BMI due to telephone visit type  Hearing/Vision  Cindy [name] did not seem to have difficulty with hearing/understanding during the telephone conversation Reports that she has had a formal eye exam by an eye care professional within the past year Reports that she has not had a formal hearing evaluation within the past year *Unable to fully assess hearing and vision during telephone visit type  Cognitive Function:    09/20/2022    8:51 AM 09/12/2021    9:21 AM  6CIT Screen  What Year? 0 points 0 points  What month? 0 points 0 points  What time? 0 points 0 points  Count back from 20 0 points 0 points  Months in reverse 0 points 0 points  Repeat phrase 0 points 2 points  Total Score 0 points 2 points    (Normal:0-7, Significant for Dysfunction: >8)  Normal Cognitive Function Screening: Yes   Immunization & Health Maintenance Record Immunization History  Administered Date(s) Administered   PFIZER(Purple Top)SARS-COV-2 Vaccination 07/05/2020, 07/26/2020   Tdap 09/09/2013    Health Maintenance  Topic Date Due   COVID-19 Vaccine (3 - Pfizer risk series) 10/06/2022 (Originally 08/23/2020)   INFLUENZA VACCINE  12/08/2022 (Originally 04/09/2022)   Zoster Vaccines- Shingrix (1 of 2) 12/20/2022 (Originally 02/20/1965)   Pneumonia Vaccine 45+ Years old (1 - PCV) 09/21/2023 (Originally 02/19/2009)   DEXA SCAN  09/21/2023 (Originally 05/10/2022)   DTaP/Tdap/Td (2 - Td or Tdap) 09/10/2023   Medicare Annual Wellness (AWV)  09/21/2023   Hepatitis C Screening  Completed   HPV VACCINES  Aged Out       Assessment  This is a routine wellness examination for Cindy [name].  Health Maintenance: Due or Overdue There are no preventive care reminders to display for this patient.   Cindy [name] does not need a referral for Community Assistance: Care Management:   no Social Work:    no Prescription Assistance:  no Nutrition/Diabetes Education:  no   Plan:  Personalized Goals  Goals Addressed               This Visit's Progress     Patient Stated (pt-stated)        Patient stated that she just had knee surgery, she would like to be able to recover from that and being able to walk independently again.       Personalized Health Maintenance & Screening Recommendations  Pneumococcal vaccine  Shingles vaccine Influenza vaccine Bone density referral   Patient declined the vaccines at this time and due to recent surgery would like to wait to schedule the bone density scan.  Lung Cancer Screening Recommended: no (Low Dose CT Chest recommended if Age 51-80 years, 30 pack-year currently smoking OR have quit w/in past 15 years) Hepatitis C Screening recommended: no HIV Screening  recommended: no  Advanced Directives: Written information was not prepared per patient's request.  Referrals & Orders No orders of the defined types were placed in this encounter.   Follow-up Plan Follow-up with Samuel Bouche, NP as planned Please let us know when you are ready for the bone density scan. Medicare wellness visit in one year.  Patient will access AVS on my chart.   I have personally reviewed and noted the following in the patient's chart:   Medical and social history Use of alcohol, tobacco or illicit drugs  Current medications and supplements Functional ability and status Nutritional status Physical activity Advanced directives List of other physicians Hospitalizations, surgeries, and ER visits in previous 12 months Vitals Screenings to include cognitive, depression, and falls Referrals and appointments  In addition, I have reviewed and discussed with Cindy [name] certain preventive protocols, quality metrics, and best practice recommendations. A written personalized care plan for preventive services as well as general preventive health recommendations is available and can be mailed to the patient at her request.      Tinnie Gens, RN BSN  09/20/2022

## 2022-09-20 NOTE — Patient Instructions (Addendum)
[facility] Maintenance Summary and Written Plan of Care  Ms. Cindy [name] ,  Thank you for allowing me to perform your Medicare Annual Wellness Visit and for your ongoing commitment to your health.   Health Maintenance & Immunization History Health Maintenance  Topic Date Due   COVID-19 Vaccine (3 - Pfizer risk series) 10/06/2022 (Originally 08/23/2020)   INFLUENZA VACCINE  12/08/2022 (Originally 04/09/2022)   Zoster Vaccines- Shingrix (1 of 2) 12/20/2022 (Originally 02/20/1965)   Pneumonia Vaccine 68+ Years old (1 - PCV) 09/21/2023 (Originally 02/19/2009)   DEXA SCAN  09/21/2023 (Originally 05/10/2022)   DTaP/Tdap/Td (2 - Td or Tdap) 09/10/2023   Medicare Annual Wellness (AWV)  09/21/2023   Hepatitis C Screening  Completed   HPV VACCINES  Aged Out   Immunization History  Administered Date(s) Administered   PFIZER(Purple Top)SARS-COV-2 Vaccination 07/05/2020, 07/26/2020   Tdap 09/09/2013    These are the patient goals that we discussed:  Goals Addressed               This Visit's Progress     Patient Stated (pt-stated)        Patient stated that she just had knee surgery, she would like to be able to recover from that and being able to walk independently again.         This is a list of Health Maintenance Items that are overdue or due now: Pneumococcal vaccine  Shingles vaccine Influenza vaccine Bone density referral   Patient declined the vaccines at this time and due to recent surgery would like to wait to schedule the bone density scan.    Orders/Referrals Placed Today: No orders of the defined types were placed in this encounter.  (Contact our referral department at [phone] if you have not spoken with someone about your referral appointment within the next 5 days)    Follow-up Plan Follow-up with Samuel Bouche, NP as planned Please let us know when you are ready for the bone density scan. Medicare wellness visit in one year.   Patient will access AVS on my chart.      Health Maintenance, Female Adopting a healthy lifestyle and getting preventive care are important in promoting health and wellness. Ask your health care provider about: The right schedule for you to have regular tests and exams. Things you can do on your own to prevent diseases and keep yourself healthy. What should I know about diet, weight, and exercise? Eat a healthy diet  Eat a diet that includes plenty of vegetables, fruits, low-fat dairy products, and lean protein. Do not eat a lot of foods that are high in solid fats, added sugars, or sodium. Maintain a healthy weight Body mass index (BMI) is used to identify weight problems. It estimates body fat based on height and weight. Your health care provider can help determine your BMI and help you achieve or maintain a healthy weight. Get regular exercise Get regular exercise. This is one of the most important things you can do for your health. Most adults should: Exercise for at least 150 minutes each week. The exercise should increase your heart rate and make you sweat (moderate-intensity exercise). Do strengthening exercises at least twice a week. This is in addition to the moderate-intensity exercise. Spend less time sitting. Even light physical activity can be beneficial. Watch cholesterol and blood lipids Have your blood tested for lipids and cholesterol at 79 years of age, then have this test every 5 years. Have your cholesterol levels checked  more often if: Your lipid or cholesterol levels are high. You are older than 79 years of age. You are at high risk for heart disease. What should I know about cancer screening? Depending on your health history and family history, you may need to have cancer screening at various ages. This may include screening for: Breast cancer. Cervical cancer. Colorectal cancer. Skin cancer. Lung cancer. What should I know about heart disease, diabetes, and  high blood pressure? Blood pressure and heart disease High blood pressure causes heart disease and increases the risk of stroke. This is more likely to develop in people who have high blood pressure readings or are overweight. Have your blood pressure checked: Every 3-5 years if you are 10-86 years of age. Every year if you are 21 years old or older. Diabetes Have regular diabetes screenings. This checks your fasting blood sugar level. Have the screening done: Once every three years after age 54 if you are at a normal weight and have a low risk for diabetes. More often and at a younger age if you are overweight or have a high risk for diabetes. What should I know about preventing infection? Hepatitis B If you have a higher risk for hepatitis B, you should be screened for this virus. Talk with your health care provider to find out if you are at risk for hepatitis B infection. Hepatitis C Testing is recommended for: Everyone born from [birth year] through 1967. Anyone with known risk factors for hepatitis C. Sexually transmitted infections (STIs) Get screened for STIs, including gonorrhea and chlamydia, if: You are sexually active and are younger than 79 years of age. You are older than 79 years of age and your health care provider tells you that you are at risk for this type of infection. Your sexual activity has changed since you were last screened, and you are at increased risk for chlamydia or gonorrhea. Ask your health care provider if you are at risk. Ask your health care provider about whether you are at high risk for HIV. Your health care provider may recommend a prescription medicine to help prevent HIV infection. If you choose to take medicine to prevent HIV, you should first get tested for HIV. You should then be tested every 3 months for as long as you are taking the medicine. Pregnancy If you are about to stop having your period (premenopausal) and you may become pregnant, seek counseling  before you get pregnant. Take 400 to 800 micrograms (mcg) of folic acid every day if you become pregnant. Ask for birth control (contraception) if you want to prevent pregnancy. Osteoporosis and menopause Osteoporosis is a disease in which the bones lose minerals and strength with aging. This can result in bone fractures. If you are 31 years old or older, or if you are at risk for osteoporosis and fractures, ask your health care provider if you should: Be screened for bone loss. Take a calcium or vitamin D supplement to lower your risk of fractures. Be given hormone replacement therapy (HRT) to treat symptoms of menopause. Follow these instructions at home: Alcohol use Do not drink alcohol if: Your health care provider tells you not to drink. You are pregnant, may be pregnant, or are planning to become pregnant. If you drink alcohol: Limit how much you have to: 0-1 drink a day. Know how much alcohol is in your drink. In the [place] one drink equals one 12 oz bottle of beer (355 mL), one 5 oz glass of wine (148  mL), or one 1 oz glass of hard liquor (44 mL). Lifestyle Do not use any products that contain nicotine or tobacco. These products include cigarettes, chewing tobacco, and vaping devices, such as e-cigarettes. If you need help quitting, ask your health care provider. Do not use street drugs. Do not share needles. Ask your health care provider for help if you need support or information about quitting drugs. General instructions Schedule regular health, dental, and eye exams. Stay current with your vaccines. Tell your health care provider if: You often feel depressed. You have ever been abused or do not feel safe at home. Summary Adopting a healthy lifestyle and getting preventive care are important in promoting health and wellness. Follow your health care provider's instructions about healthy diet, exercising, and getting tested or screened for diseases. Follow your health care  provider's instructions on monitoring your cholesterol and blood pressure. This information is not intended to replace advice given to you by your health care provider. Make sure you discuss any questions you have with your health care provider. Document Revised: 01/15/2021 Document Reviewed: 01/15/2021 Elsevier Patient Education  [employer].

## 2022-09-24 DIAGNOSIS — Z9889 Other specified postprocedural states: Secondary | ICD-10-CM | POA: Diagnosis not present

## 2022-09-24 DIAGNOSIS — M25661 Stiffness of right knee, not elsewhere classified: Secondary | ICD-10-CM | POA: Diagnosis not present

## 2022-09-24 DIAGNOSIS — Z96651 Presence of right artificial knee joint: Secondary | ICD-10-CM | POA: Diagnosis not present

## 2022-09-25 DIAGNOSIS — Z96651 Presence of right artificial knee joint: Secondary | ICD-10-CM | POA: Diagnosis not present

## 2022-09-25 DIAGNOSIS — M25661 Stiffness of right knee, not elsewhere classified: Secondary | ICD-10-CM | POA: Diagnosis not present

## 2022-09-25 DIAGNOSIS — M6281 Muscle weakness (generalized): Secondary | ICD-10-CM | POA: Diagnosis not present

## 2022-09-27 DIAGNOSIS — M25661 Stiffness of right knee, not elsewhere classified: Secondary | ICD-10-CM | POA: Diagnosis not present

## 2022-09-27 DIAGNOSIS — M6281 Muscle weakness (generalized): Secondary | ICD-10-CM | POA: Diagnosis not present

## 2022-09-27 DIAGNOSIS — Z96651 Presence of right artificial knee joint: Secondary | ICD-10-CM | POA: Diagnosis not present

## 2022-09-30 ENCOUNTER — Other Ambulatory Visit: Payer: Self-pay | Admitting: Medical-Surgical

## 2022-09-30 DIAGNOSIS — M25661 Stiffness of right knee, not elsewhere classified: Secondary | ICD-10-CM | POA: Diagnosis not present

## 2022-09-30 DIAGNOSIS — M6281 Muscle weakness (generalized): Secondary | ICD-10-CM | POA: Diagnosis not present

## 2022-09-30 DIAGNOSIS — Z96651 Presence of right artificial knee joint: Secondary | ICD-10-CM | POA: Diagnosis not present

## 2022-10-02 ENCOUNTER — Other Ambulatory Visit: Payer: Self-pay | Admitting: Family Medicine

## 2022-10-02 DIAGNOSIS — M25661 Stiffness of right knee, not elsewhere classified: Secondary | ICD-10-CM | POA: Diagnosis not present

## 2022-10-02 DIAGNOSIS — M6281 Muscle weakness (generalized): Secondary | ICD-10-CM | POA: Diagnosis not present

## 2022-10-02 DIAGNOSIS — I2510 Atherosclerotic heart disease of native coronary artery without angina pectoris: Secondary | ICD-10-CM

## 2022-10-02 DIAGNOSIS — Z96651 Presence of right artificial knee joint: Secondary | ICD-10-CM | POA: Diagnosis not present

## 2022-10-07 ENCOUNTER — Other Ambulatory Visit: Payer: Self-pay

## 2022-10-07 DIAGNOSIS — M25661 Stiffness of right knee, not elsewhere classified: Secondary | ICD-10-CM | POA: Diagnosis not present

## 2022-10-07 DIAGNOSIS — I2510 Atherosclerotic heart disease of native coronary artery without angina pectoris: Secondary | ICD-10-CM

## 2022-10-07 DIAGNOSIS — Z96651 Presence of right artificial knee joint: Secondary | ICD-10-CM | POA: Diagnosis not present

## 2022-10-07 DIAGNOSIS — M6281 Muscle weakness (generalized): Secondary | ICD-10-CM | POA: Diagnosis not present

## 2022-10-07 MED ORDER — ROSUVASTATIN CALCIUM 20 MG PO TABS: 20.0000 mg | ORAL_TABLET | Freq: Every day | ORAL | 0 refills | Status: DC

## 2022-10-07 NOTE — Telephone Encounter (Signed)
Dot called and left a message stating her Crestor was denied. Last prescribed by Caleen Jobs, NP. Last lipid panel 02/22/2019. Caryl Asp is out of the office.

## 2022-10-07 NOTE — Telephone Encounter (Signed)
Needs lipid panel. Will send one month.

## 2022-10-08 ENCOUNTER — Other Ambulatory Visit: Payer: Medicare Other

## 2022-10-08 ENCOUNTER — Other Ambulatory Visit: Payer: Self-pay

## 2022-10-08 DIAGNOSIS — I10 Essential (primary) hypertension: Secondary | ICD-10-CM | POA: Diagnosis not present

## 2022-10-09 ENCOUNTER — Other Ambulatory Visit: Payer: Self-pay

## 2022-10-09 DIAGNOSIS — I2510 Atherosclerotic heart disease of native coronary artery without angina pectoris: Secondary | ICD-10-CM

## 2022-10-09 LAB — LIPID PANEL
Cholesterol: 157 mg/dL (ref <200)
HDL: 56 mg/dL (ref >=50)
LDL Cholesterol (Calc): 78 mg/dL (calc)
Non-HDL Cholesterol (Calc): 101 mg/dL (calc) (ref <130)
Total CHOL/HDL Ratio: 2.8 (calc) (ref <5.0)
Triglycerides: 136 mg/dL (ref <150)

## 2022-10-09 MED ORDER — ROSUVASTATIN CALCIUM 20 MG PO TABS: 20.0000 mg | ORAL_TABLET | Freq: Every day | ORAL | 3 refills | Status: DC

## 2022-10-13 ENCOUNTER — Other Ambulatory Visit: Payer: Self-pay | Admitting: Medical-Surgical

## 2022-10-13 DIAGNOSIS — F411 Generalized anxiety disorder: Secondary | ICD-10-CM

## 2022-10-16 DIAGNOSIS — Z96651 Presence of right artificial knee joint: Secondary | ICD-10-CM | POA: Diagnosis not present

## 2022-10-16 DIAGNOSIS — M6281 Muscle weakness (generalized): Secondary | ICD-10-CM | POA: Diagnosis not present

## 2022-10-16 DIAGNOSIS — M25661 Stiffness of right knee, not elsewhere classified: Secondary | ICD-10-CM | POA: Diagnosis not present

## 2022-10-17 DIAGNOSIS — M25661 Stiffness of right knee, not elsewhere classified: Secondary | ICD-10-CM | POA: Diagnosis not present

## 2022-10-17 DIAGNOSIS — M6281 Muscle weakness (generalized): Secondary | ICD-10-CM | POA: Diagnosis not present

## 2022-10-17 DIAGNOSIS — Z96651 Presence of right artificial knee joint: Secondary | ICD-10-CM | POA: Diagnosis not present

## 2022-10-22 DIAGNOSIS — Z96651 Presence of right artificial knee joint: Secondary | ICD-10-CM | POA: Diagnosis not present

## 2022-10-22 DIAGNOSIS — M25661 Stiffness of right knee, not elsewhere classified: Secondary | ICD-10-CM | POA: Diagnosis not present

## 2022-10-22 DIAGNOSIS — M6281 Muscle weakness (generalized): Secondary | ICD-10-CM | POA: Diagnosis not present

## 2022-10-23 DIAGNOSIS — Z96651 Presence of right artificial knee joint: Secondary | ICD-10-CM | POA: Diagnosis not present

## 2022-10-23 DIAGNOSIS — M25661 Stiffness of right knee, not elsewhere classified: Secondary | ICD-10-CM | POA: Diagnosis not present

## 2022-10-23 DIAGNOSIS — M6281 Muscle weakness (generalized): Secondary | ICD-10-CM | POA: Diagnosis not present

## 2022-10-24 DIAGNOSIS — N3080 Other cystitis without hematuria: Secondary | ICD-10-CM | POA: Diagnosis not present

## 2022-10-24 DIAGNOSIS — N3001 Acute cystitis with hematuria: Secondary | ICD-10-CM | POA: Diagnosis not present

## 2022-10-24 DIAGNOSIS — R350 Frequency of micturition: Secondary | ICD-10-CM | POA: Diagnosis not present

## 2022-10-24 DIAGNOSIS — R300 Dysuria: Secondary | ICD-10-CM | POA: Diagnosis not present

## 2022-10-30 NOTE — Progress Notes (Signed)
[id number] chest pain. Patient previously resided in [place] and moved here in 2019 to be close to family.  She has had occasional chest pain intermittently for approximately 20 years.  Coronary CTA September 2021 showed calcium score 0, no coronary disease and spotty aortic atherosclerosis.  Since last seen she occasionally has dyspnea which is chronic.  She occasionally has a brief chest pain but no exertional symptoms.  She did have her knee replaced last June.  She states that she has occasional "dizzy spells".  They typically occur when changing positions like getting out of the bathtub or going from the sitting to the standing position.  No associated chest pain.  Current Outpatient Medications  Medication Sig Dispense Refill   acetaminophen (TYLENOL) 500 MG tablet Take 1,000 mg by mouth every 6 (six) hours as needed for moderate pain.     albuterol (VENTOLIN HFA) 108 (90 Base) MCG/ACT inhaler Inhale 2 puffs into the lungs every 6 (six) hours as needed for wheezing. 2 each 11   aspirin EC (BAYER ASPIRIN) 325 MG tablet TAKE 1 TABLET BY MOUTH TWICE DAILY WITH FOOD TO DECREASE RISK OF BLOOD CLOTS. TAKE 1 MONTH POST OP     Calcium-Vitamin D-Vitamin K 500-100-40 MG-UNT-MCG CHEW Chew 2 tablets by mouth daily.     celecoxib (CELEBREX) 200 MG capsule Take by mouth.     cetirizine (ZYRTEC) 10 MG tablet Take 1 tablet (10 mg total) by mouth daily as needed for allergies. 90 tablet 1   denosumab (PROLIA) 60 MG/ML SOSY injection Inject 60 mg into the skin every 6 (six) months.     dorzolamide-timolol (COSOPT) 2-0.5 % ophthalmic solution Place 1 drop into both eyes 2 (two) times daily.     escitalopram (LEXAPRO) 5 MG tablet Take 1 tablet by mouth once daily 90 tablet 0   esomeprazole (NEXIUM) 40 MG capsule Take 1 capsule by mouth once daily 90 capsule 0   hydrochlorothiazide (HYDRODIURIL) 25 MG tablet TAKE 1 TABLET BY MOUTH ONCE DAILY AS NEEDED SWELLING 90 tablet 1   latanoprost (XALATAN) 0.005  % ophthalmic solution      levothyroxine (SYNTHROID) 75 MCG tablet Take 1 tablet (75 mcg total) by mouth daily before breakfast. 90 tablet 1   NIFEdipine (ADALAT CC) 30 MG 24 hr tablet Take 1 tablet (30 mg total) by mouth daily. NEEDS AN APPOINTMENT FOR FURTHER REFILLS. 90 tablet 0   nitroGLYCERIN (NITROSTAT) 0.4 MG SL tablet Place 1 tablet (0.4 mg total) under the tongue every 5 (five) minutes as needed for chest pain. 20 tablet 1   oxyCODONE (OXY IR/ROXICODONE) 5 MG immediate release tablet Take by mouth.     Probiotic Product (PROBIOTIC PO) Take 1 capsule by mouth daily.     rosuvastatin (CRESTOR) 20 MG tablet Take 1 tablet (20 mg total) by mouth daily. 90 tablet 3   SUMAtriptan (IMITREX) 100 MG tablet Take 0.5-1 tablets (50-100 mg total) by mouth every 2 (two) hours as needed for migraine (Max 2 pills per 24 hours). 10 tablet 3   tiZANidine (ZANAFLEX) 4 MG tablet Take by mouth.     vitamin B-12 (CYANOCOBALAMIN) 500 MCG tablet Take 500 mcg by mouth daily.      fluticasone (FLONASE) 50 MCG/ACT nasal spray Place 2 sprays into both nostrils daily. 1 g 2   No current facility-administered medications for this visit.     Past Medical History:  Diagnosis Date   Abnormal mammogram    Allergic rhinitis 10/30/2018  Allergy    Tetracycoline. Sulfa   Anxiety in acute stress reaction 10/30/2018   Arthritis    Atypical chest pain 10/30/2018   Cancer [facility])    breast    Cataract    Both eyes surgery   Chest pain    "not cardiac related"   Colon polyps    Dyspnea    DOE   Family history of breast cancer 07/05/2020   Family history of pancreatic cancer 07/05/2020   Family history of prostate cancer 07/05/2020   Genetic testing 07/11/2020   GERD (gastroesophageal reflux disease) 10/30/2018   History of kidney stones    passed   Hypertension 10/30/2018   Hyperthyroidism    Hypothyroidism 10/30/2018   Migraine 10/30/2018   Osteoporosis 10/30/2018   Personal history of chemotherapy     Personal history of radiation therapy    Pneumonia    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   Vitamin D deficiency 10/30/2018    Past Surgical History:  Procedure Laterality Date   ABDOMINAL HYSTERECTOMY     BREAST BIOPSY Left 06/26/2020   x2   BREAST LUMPECTOMY Left 08/15/2020   BREAST LUMPECTOMY WITH RADIOACTIVE SEED AND SENTINEL LYMPH NODE BIOPSY Left 08/15/2020   Procedure: LEFT BREAST LUMPECTOMY WITH RADIOACTIVE SEED AND SENTINEL LYMPH NODE BIOPSY;  Surgeon: Stark Klein, MD;  Location: [place];  Service: General;  Laterality: Left;  RNFA   COLONOSCOPY W/ POLYPECTOMY     ESOPHAGEAL DILATION     EYE SURGERY Bilateral    catheter with lens   HYSTERECTOMY ABDOMINAL WITH SALPINGECTOMY     KNEE SURGERY Right 09/13/2022   PORT-A-CATH REMOVAL  01/04/2021   Procedure: REMOVAL PORT-A-CATH;  Surgeon: Stark Klein, MD;  Location: [place];  Service: General;;   PORTACATH PLACEMENT Right 08/15/2020   Procedure: INSERTION PORT-A-CATH;  Surgeon: Stark Klein, MD;  [facility];  Service: General;  Laterality: Right;   TONSILLECTOMY     TUBAL LIGATION      Social History   Socioeconomic History   Marital status: Married    Spouse name: Verdia Kuba "Dwayne"   Number of children: 2   Years of education: 15   Highest education level: Some college, no degree  Occupational History   Occupation: Retired.  Tobacco Use   Smoking status: Never   Smokeless tobacco: Never  Vaping Use   Vaping Use: Never used  Substance and Sexual Activity   Alcohol use: Not Currently    Comment: rarely   Drug use: Never   Sexual activity: Not Currently    Partners: Male    Birth control/protection: None  Other Topics Concern   Not on file  Social History Narrative   Lives with her husband. She enjoys water colors and gardening.   Social Determinants of Health   Financial Resource Strain: Low Risk  (09/20/2022)   Overall Financial Resource Strain (CARDIA)    Difficulty of  Paying Living Expenses: Not hard at all  Food Insecurity: No Food Insecurity (09/20/2022)   Hunger Vital Sign    Worried About Running Out of Food in the Last Year: Never true    Ran Out of Food in the Last Year: Never true  Transportation Needs: No Transportation Needs (09/20/2022)   PRAPARE - Hydrologist (Medical): No    Lack of Transportation (Non-Medical): No  Physical Activity: Sufficiently Active (09/20/2022)   Exercise Vital Sign    Days of Exercise per Week: 3 days    Minutes  of Exercise per Session: 60 min  Stress: No Stress Concern Present (09/20/2022)   [place]    Feeling of Stress : Not at all  Social Connections: Moderately Isolated (09/20/2022)   Social Connection and Isolation Panel [NHANES]    Frequency of Communication with Friends and Family: Three times a week    Frequency of Social Gatherings with Friends and Family: Once a week    Attends Religious Services: Never    Marine scientist or Organizations: No    Attends Archivist Meetings: Never    Marital Status: Married  Human resources officer Violence: Not At Risk (09/20/2022)   Humiliation, Afraid, Rape, and Kick questionnaire    Fear of Current or Ex-Partner: No    Emotionally Abused: No    Physically Abused: No    Sexually Abused: No    Family History  Problem Relation Age of Onset   High blood pressure Mother    Breast cancer Mother        dx before 61   Alzheimer's disease Mother    Cancer Mother    Heart attack Father    Prostate cancer Brother 73   Breast cancer Maternal Aunt        dx 66s   Cancer Maternal Aunt    Breast cancer Maternal Aunt        dx 57s   Breast cancer Maternal Grandmother        dx after 72   Cancer Maternal Grandmother    Leukemia Paternal Grandfather        dx 45s-80s   Pancreatic cancer Cousin        maternal; dx 12s   Cancer Maternal Aunt 50       unknown type    Breast cancer Cousin        paternal; dx 20s   Cancer Maternal Aunt    Diabetes Daughter     ROS: no fevers or chills, productive cough, hemoptysis, dysphasia, odynophagia, melena, hematochezia, dysuria, hematuria, rash, seizure activity, orthopnea, PND, pedal edema, claudication. Remaining systems are negative.  Physical Exam: Well-developed well-nourished in no acute distress.  Skin is warm and dry.  HEENT is normal.  Neck is supple.  Chest is clear to auscultation with normal expansion.  Cardiovascular exam is regular rate and rhythm.  Abdominal exam nontender or distended. No masses palpated. Extremities show no edema. neuro grossly intact  ECG-normal sinus rhythm at a rate of 90, cannot rule out anterior and inferior infarct.  Personally reviewed  A/P  1 chest pain-patient has longstanding atypical chest pain.  Previous CT scan showed no coronary disease.  Would not pursue further ischemia evaluation.  2 hypertension-blood pressure controlled.  Continue present medical regimen.  3 hyperlipidemia-continue statin.  4 dizzy spells-patient sounds to potentially be having orthostatic symptoms.  I have asked her to increase her p.o. fluid intake.  I will also arrange an echocardiogram to assess LV function particularly in light of dyspnea at times.  Kirk Ruths, MD

## 2022-10-31 DIAGNOSIS — Z96651 Presence of right artificial knee joint: Secondary | ICD-10-CM | POA: Diagnosis not present

## 2022-10-31 DIAGNOSIS — M6281 Muscle weakness (generalized): Secondary | ICD-10-CM | POA: Diagnosis not present

## 2022-10-31 DIAGNOSIS — M25661 Stiffness of right knee, not elsewhere classified: Secondary | ICD-10-CM | POA: Diagnosis not present

## 2022-11-04 DIAGNOSIS — M25661 Stiffness of right knee, not elsewhere classified: Secondary | ICD-10-CM | POA: Diagnosis not present

## 2022-11-04 DIAGNOSIS — Z96651 Presence of right artificial knee joint: Secondary | ICD-10-CM | POA: Diagnosis not present

## 2022-11-04 DIAGNOSIS — M6281 Muscle weakness (generalized): Secondary | ICD-10-CM | POA: Diagnosis not present

## 2022-11-06 DIAGNOSIS — M6281 Muscle weakness (generalized): Secondary | ICD-10-CM | POA: Diagnosis not present

## 2022-11-06 DIAGNOSIS — Z96651 Presence of right artificial knee joint: Secondary | ICD-10-CM | POA: Diagnosis not present

## 2022-11-06 DIAGNOSIS — M25661 Stiffness of right knee, not elsewhere classified: Secondary | ICD-10-CM | POA: Diagnosis not present

## 2022-11-08 DIAGNOSIS — M25661 Stiffness of right knee, not elsewhere classified: Secondary | ICD-10-CM | POA: Diagnosis not present

## 2022-11-08 DIAGNOSIS — M6281 Muscle weakness (generalized): Secondary | ICD-10-CM | POA: Diagnosis not present

## 2022-11-08 DIAGNOSIS — Z96651 Presence of right artificial knee joint: Secondary | ICD-10-CM | POA: Diagnosis not present

## 2022-11-12 DIAGNOSIS — M6281 Muscle weakness (generalized): Secondary | ICD-10-CM | POA: Diagnosis not present

## 2022-11-12 DIAGNOSIS — M25661 Stiffness of right knee, not elsewhere classified: Secondary | ICD-10-CM | POA: Diagnosis not present

## 2022-11-12 DIAGNOSIS — Z96651 Presence of right artificial knee joint: Secondary | ICD-10-CM | POA: Diagnosis not present

## 2022-11-13 ENCOUNTER — Ambulatory Visit (INDEPENDENT_AMBULATORY_CARE_PROVIDER_SITE_OTHER): Payer: Medicare Other | Admitting: Cardiology

## 2022-11-13 ENCOUNTER — Encounter: Payer: Self-pay | Admitting: Cardiology

## 2022-11-13 VITALS — BP 106/73 | HR 90 | Ht 64.0 in | Wt 143.8 lb

## 2022-11-13 DIAGNOSIS — R072 Precordial pain: Secondary | ICD-10-CM | POA: Diagnosis not present

## 2022-11-13 DIAGNOSIS — I10 Essential (primary) hypertension: Secondary | ICD-10-CM | POA: Diagnosis not present

## 2022-11-13 DIAGNOSIS — E7800 Pure hypercholesterolemia, unspecified: Secondary | ICD-10-CM

## 2022-11-13 DIAGNOSIS — R42 Dizziness and giddiness: Secondary | ICD-10-CM | POA: Diagnosis not present

## 2022-11-13 NOTE — Patient Instructions (Signed)
  Testing/Procedures:  Your physician has requested that you have an echocardiogram. Echocardiography is a painless test that uses sound waves to create images of your heart. It provides your doctor with information about the size and shape of your heart and how well your heart's chambers and valves are working. This procedure takes approximately one hour. There are no restrictions for this procedure. Please do NOT wear cologne, perfume, aftershave, or lotions (deodorant is allowed). Please arrive 15 minutes prior to your appointment time. [address]   Follow-Up: At [facility], you and your health needs are our priority.  As part of our continuing mission to provide you with exceptional heart care, we have created designated Provider Care Teams.  These Care Teams include your primary Cardiologist (physician) and Advanced Practice Providers (APPs -  Physician Assistants and Nurse Practitioners) who all work together to provide you with the care you need, when you need it.  We recommend signing up for the patient portal called "MyChart".  Sign up information is provided on this After Visit Summary.  MyChart is used to connect with patients for Virtual Visits (Telemedicine).  Patients are able to view lab/test results, encounter notes, upcoming appointments, etc.  Non-urgent messages can be sent to your provider as well.   To learn more about what you can do with MyChart, go to [email or web address].    Your next appointment:   6 month(s)  Provider:   Brian Crenshaw, MD     

## 2022-11-19 DIAGNOSIS — M6281 Muscle weakness (generalized): Secondary | ICD-10-CM | POA: Diagnosis not present

## 2022-11-19 DIAGNOSIS — Z96651 Presence of right artificial knee joint: Secondary | ICD-10-CM | POA: Diagnosis not present

## 2022-11-19 DIAGNOSIS — M25661 Stiffness of right knee, not elsewhere classified: Secondary | ICD-10-CM | POA: Diagnosis not present

## 2022-11-21 DIAGNOSIS — M6281 Muscle weakness (generalized): Secondary | ICD-10-CM | POA: Diagnosis not present

## 2022-11-21 DIAGNOSIS — M25661 Stiffness of right knee, not elsewhere classified: Secondary | ICD-10-CM | POA: Diagnosis not present

## 2022-11-21 DIAGNOSIS — Z96651 Presence of right artificial knee joint: Secondary | ICD-10-CM | POA: Diagnosis not present

## 2022-11-26 DIAGNOSIS — M6281 Muscle weakness (generalized): Secondary | ICD-10-CM | POA: Diagnosis not present

## 2022-11-26 DIAGNOSIS — M25661 Stiffness of right knee, not elsewhere classified: Secondary | ICD-10-CM | POA: Diagnosis not present

## 2022-11-26 DIAGNOSIS — Z96651 Presence of right artificial knee joint: Secondary | ICD-10-CM | POA: Diagnosis not present

## 2022-11-29 DIAGNOSIS — Z96651 Presence of right artificial knee joint: Secondary | ICD-10-CM | POA: Diagnosis not present

## 2022-11-29 DIAGNOSIS — M25661 Stiffness of right knee, not elsewhere classified: Secondary | ICD-10-CM | POA: Diagnosis not present

## 2022-11-29 DIAGNOSIS — M6281 Muscle weakness (generalized): Secondary | ICD-10-CM | POA: Diagnosis not present

## 2022-12-03 DIAGNOSIS — M25661 Stiffness of right knee, not elsewhere classified: Secondary | ICD-10-CM | POA: Diagnosis not present

## 2022-12-09 ENCOUNTER — Ambulatory Visit (HOSPITAL_COMMUNITY): Payer: Medicare Other | Attending: Cardiovascular Disease

## 2022-12-09 DIAGNOSIS — R072 Precordial pain: Secondary | ICD-10-CM | POA: Insufficient documentation

## 2022-12-09 LAB — ECHOCARDIOGRAM COMPLETE
Area-P 1/2: 4.49 cm2
S' Lateral: 3.2 cm

## 2022-12-09 MED ORDER — PERFLUTREN LIPID MICROSPHERE
1.0000 mL | INTRAVENOUS | Status: AC | PRN
Start: 2022-12-09 — End: 2022-12-09
  Administered 2022-12-09: 2 mL via INTRAVENOUS

## 2022-12-10 ENCOUNTER — Other Ambulatory Visit: Payer: Self-pay | Admitting: Medical-Surgical

## 2022-12-10 DIAGNOSIS — K219 Gastro-esophageal reflux disease without esophagitis: Secondary | ICD-10-CM

## 2022-12-26 ENCOUNTER — Other Ambulatory Visit: Payer: Self-pay | Admitting: Medical-Surgical

## 2023-01-16 DIAGNOSIS — H401133 Primary open-angle glaucoma, bilateral, severe stage: Secondary | ICD-10-CM | POA: Diagnosis not present

## 2023-01-16 DIAGNOSIS — Z961 Presence of intraocular lens: Secondary | ICD-10-CM | POA: Diagnosis not present

## 2023-01-20 ENCOUNTER — Ambulatory Visit: Payer: Medicare Other | Attending: Radiation Oncology

## 2023-01-20 VITALS — Wt 146.0 lb

## 2023-01-20 DIAGNOSIS — Z483 Aftercare following surgery for neoplasm: Secondary | ICD-10-CM | POA: Insufficient documentation

## 2023-01-20 NOTE — Therapy (Signed)
OUTPATIENT PHYSICAL THERAPY SOZO SCREENING NOTE   Patient Name: Cindy [name] MRN: [id number] DOB:1945-11-29, 77 y.o., female Today's Date: 01/20/2023  PCP: Christen Butter, NP REFERRING PROVIDER: Lonie Peak, MD   PT End of Session - 01/20/23 1008     Visit Number 6   # unchanged due to screen only   PT Start Time 1006    PT Stop Time 1013    PT Time Calculation (min) 7 min    Activity Tolerance Patient tolerated treatment well    Behavior During Therapy [facility] for tasks assessed/performed             Past Medical History:  Diagnosis Date   Abnormal mammogram    Allergic rhinitis 10/30/2018   Allergy    Tetracycoline. Sulfa   Anxiety in acute stress reaction 10/30/2018   Arthritis    Atypical chest pain 10/30/2018   Cancer [facility])    breast    Cataract    Both eyes surgery   Chest pain    "not cardiac related"   Colon polyps    Dyspnea    DOE   Family history of breast cancer 07/05/2020   Family history of pancreatic cancer 07/05/2020   Family history of prostate cancer 07/05/2020   Genetic testing 07/11/2020   GERD (gastroesophageal reflux disease) 10/30/2018   History of kidney stones    passed   Hypertension 10/30/2018   Hyperthyroidism    Hypothyroidism 10/30/2018   Migraine 10/30/2018   Osteoporosis 10/30/2018   Personal history of chemotherapy    Personal history of radiation therapy    Pneumonia    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   Vitamin D deficiency 10/30/2018   Past Surgical History:  Procedure Laterality Date   ABDOMINAL HYSTERECTOMY     BREAST BIOPSY Left 06/26/2020   x2   BREAST LUMPECTOMY Left 08/15/2020   BREAST LUMPECTOMY WITH RADIOACTIVE SEED AND SENTINEL LYMPH NODE BIOPSY Left 08/15/2020   Procedure: LEFT BREAST LUMPECTOMY WITH RADIOACTIVE SEED AND SENTINEL LYMPH NODE BIOPSY;  Surgeon: Almond Lint, MD;  [facility];  Service: General;  Laterality: Left;  RNFA   COLONOSCOPY W/ POLYPECTOMY     ESOPHAGEAL DILATION      EYE SURGERY Bilateral    catheter with lens   HYSTERECTOMY ABDOMINAL WITH SALPINGECTOMY     KNEE SURGERY Right 09/13/2022   PORT-A-CATH REMOVAL  01/04/2021   Procedure: REMOVAL PORT-A-CATH;  Surgeon: Almond Lint, MD;  Location: [facility];  Service: General;;   PORTACATH PLACEMENT Right 08/15/2020   Procedure: INSERTION PORT-A-CATH;  Surgeon: Almond Lint, MD;  [facility];  Service: General;  Laterality: Right;   TONSILLECTOMY     TUBAL LIGATION     Patient Active Problem List   Diagnosis Date Noted   Port-A-Cath in place 09/12/2020   Genetic testing 07/11/2020   Family history of breast cancer 07/05/2020   Family history of prostate cancer 07/05/2020   Malignant neoplasm of upper-outer quadrant of left breast in female, estrogen receptor negative (HCC) 06/30/2020   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/23/2019   Primary osteoarthritis of both hands 06/23/2019   Trigger thumb of left hand 06/23/2019   Polyarthralgia 06/23/2019   Hypertension 10/30/2018   Atypical chest pain 10/30/2018   Migraine 10/30/2018   Hypothyroidism 10/30/2018   Osteoporosis 10/30/2018   GERD (gastroesophageal reflux disease) 10/30/2018   Anxiety in acute stress reaction 10/30/2018   Allergic rhinitis 10/30/2018   Vitamin D deficiency 10/30/2018    REFERRING DIAG: left  breast cancer at risk for lymphedema  THERAPY DIAG: Aftercare following surgery for neoplasm  PERTINENT HISTORY: Patient was diagnosed on 05/10/2020 with 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 She had left lumpectomy on 08/15/2020 wth 2 negative nodes removed. Ki67 is 80%. Radiation complete   PRECAUTIONS: left UE Lymphedema risk, None  SUBJECTIVE: Pt returns for her 6 month L-Dex screen. "I've noticed my Lt breast feeling heavier and bigger again recently."   PAIN:  Are you having pain? No  SOZO SCREENING: Patient was assessed today using the SOZO machine to determine the  lymphedema index score. This was compared to her baseline score. It was determined that she is within the recommended range when compared to her baseline and no further action is needed at this time. She will continue SOZO screenings. These are done every 3 months for 2 years post operatively followed by every 6 months for 2 years, and then annually.  Encouraged pt to resume wear of her compression bra and consider getting remeasured for new ones since it's been awhile since she was initially measured. 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 Pt verbalized understanding.   L-DEX FLOWSHEETS - 01/20/23 1000       L-DEX LYMPHEDEMA SCREENING   Measurement Type Unilateral    L-DEX MEASUREMENT EXTREMITY Upper Extremity    POSITION  Standing    DOMINANT SIDE Right    At Risk Side Left    BASELINE SCORE (UNILATERAL) 3.9    L-DEX SCORE (UNILATERAL) 9.5    VALUE CHANGE (UNILAT) 5.6              Hermenia Bers, PTA 01/20/2023, 10:13 AM

## 2023-02-19 ENCOUNTER — Other Ambulatory Visit: Payer: Self-pay | Admitting: *Deleted

## 2023-02-19 DIAGNOSIS — Z171 Estrogen receptor negative status [ER-]: Secondary | ICD-10-CM

## 2023-02-20 ENCOUNTER — Other Ambulatory Visit: Payer: Self-pay

## 2023-02-20 ENCOUNTER — Inpatient Hospital Stay: Payer: Medicare Other

## 2023-02-20 ENCOUNTER — Inpatient Hospital Stay: Payer: Medicare Other | Attending: Hematology and Oncology

## 2023-02-20 ENCOUNTER — Encounter: Payer: Self-pay | Admitting: Hematology and Oncology

## 2023-02-20 VITALS — BP 149/81 | HR 41 | Temp 98.0°F | Resp 16

## 2023-02-20 DIAGNOSIS — C50412 Malignant neoplasm of upper-outer quadrant of left female breast: Secondary | ICD-10-CM | POA: Insufficient documentation

## 2023-02-20 DIAGNOSIS — M810 Age-related osteoporosis without current pathological fracture: Secondary | ICD-10-CM | POA: Diagnosis not present

## 2023-02-20 DIAGNOSIS — Z95828 Presence of other vascular implants and grafts: Secondary | ICD-10-CM

## 2023-02-20 DIAGNOSIS — Z171 Estrogen receptor negative status [ER-]: Secondary | ICD-10-CM | POA: Diagnosis not present

## 2023-02-20 LAB — CMP (CANCER CENTER ONLY)
ALT: 12 U/L (ref 0–44)
AST: 19 U/L (ref 15–41)
Albumin: 3.8 g/dL (ref 3.5–5.0)
Alkaline Phosphatase: 179 U/L — ABNORMAL HIGH (ref 38–126)
Anion gap: 5 (ref 5–15)
BUN: 13 mg/dL (ref 8–23)
CO2: 30 mmol/L (ref 22–32)
Calcium: 9.6 mg/dL (ref 8.9–10.3)
Chloride: 108 mmol/L (ref 98–111)
Creatinine: 0.89 mg/dL (ref 0.44–1.00)
GFR, Estimated: >60 mL/min (ref >60)
Glucose, Bld: 92 mg/dL (ref 70–99)
Potassium: 4.3 mmol/L (ref 3.5–5.1)
Sodium: 143 mmol/L (ref 135–145)
Total Bilirubin: 0.6 mg/dL (ref 0.3–1.2)
Total Protein: 6.8 g/dL (ref 6.5–8.1)

## 2023-02-20 LAB — CBC WITH DIFFERENTIAL (CANCER CENTER ONLY)
Abs Immature Granulocytes: 0 10*3/uL (ref 0.00–0.07)
Basophils Absolute: 0 10*3/uL (ref 0.0–0.1)
Basophils Relative: 1 %
Eosinophils Absolute: 0.1 10*3/uL (ref 0.0–0.5)
Eosinophils Relative: 1 %
HCT: 41.2 % (ref 36.0–46.0)
Hemoglobin: 13.5 g/dL (ref 12.0–15.0)
Immature Granulocytes: 0 %
Lymphocytes Relative: 32 %
Lymphs Abs: 1.4 10*3/uL (ref 0.7–4.0)
MCH: 30.6 pg (ref 26.0–34.0)
MCHC: 32.8 g/dL (ref 30.0–36.0)
MCV: 93.4 fL (ref 80.0–100.0)
Monocytes Absolute: 0.5 10*3/uL (ref 0.1–1.0)
Monocytes Relative: 12 %
Neutro Abs: 2.4 10*3/uL (ref 1.7–7.7)
Neutrophils Relative %: 54 %
Platelet Count: 186 10*3/uL (ref 150–400)
RBC: 4.41 MIL/uL (ref 3.87–5.11)
RDW: 13.3 % (ref 11.5–15.5)
WBC Count: 4.4 10*3/uL (ref 4.0–10.5)
nRBC: 0 % (ref 0.0–0.2)

## 2023-02-20 MED ORDER — DENOSUMAB 60 MG/ML ~~LOC~~ SOSY
60.0000 mg | PREFILLED_SYRINGE | Freq: Once | SUBCUTANEOUS | Status: AC
  Administered 2023-02-20: 60 mg via SUBCUTANEOUS
  Filled 2023-02-20: qty 1

## 2023-03-03 ENCOUNTER — Ambulatory Visit
Admission: RE | Admit: 2023-03-03 | Discharge: 2023-03-03 | Disposition: A | Discharge status: Home / Self Care | Payer: Medicare Other | Source: Ambulatory Visit | Attending: Family Medicine | Admitting: Family Medicine

## 2023-03-03 ENCOUNTER — Ambulatory Visit (INDEPENDENT_AMBULATORY_CARE_PROVIDER_SITE_OTHER): Payer: Medicare Other

## 2023-03-03 VITALS — BP 100/68 | HR 88 | Temp 97.8°F | Resp 17

## 2023-03-03 DIAGNOSIS — M40204 Unspecified kyphosis, thoracic region: Secondary | ICD-10-CM | POA: Diagnosis not present

## 2023-03-03 DIAGNOSIS — M62830 Muscle spasm of back: Secondary | ICD-10-CM | POA: Diagnosis not present

## 2023-03-03 DIAGNOSIS — M546 Pain in thoracic spine: Secondary | ICD-10-CM | POA: Diagnosis not present

## 2023-03-03 DIAGNOSIS — M545 Low back pain, unspecified: Secondary | ICD-10-CM

## 2023-03-03 DIAGNOSIS — S39012A Strain of muscle, fascia and tendon of lower back, initial encounter: Secondary | ICD-10-CM

## 2023-03-03 MED ORDER — KETOROLAC TROMETHAMINE 60 MG/2ML IM SOLN
60.0000 mg | Freq: Once | INTRAMUSCULAR | Status: AC
  Administered 2023-03-03: 60 mg via INTRAMUSCULAR

## 2023-03-03 MED ORDER — BACLOFEN 10 MG PO TABS: 10.0000 mg | ORAL_TABLET | Freq: Every day | ORAL | 0 refills | Status: DC | PRN

## 2023-03-03 MED ORDER — KETOROLAC TROMETHAMINE 10 MG PO TABS: 10.0000 mg | ORAL_TABLET | Freq: Three times a day (TID) | ORAL | 0 refills | Status: AC | PRN

## 2023-03-03 NOTE — ED Provider Notes (Signed)
[facility]    CSN: [id number] Arrival date & time: 03/03/23  1240      History   Chief Complaint Chief Complaint  Patient presents with   Back Pain    mid    HPI Cindy [name] is a 77 y.o. female.   HPI 77 year old female presents with back pain for 4 days.  Patient denies any specific injury; however, reports working in the yard.  Worse with movement and patient has tried ice/heat/tramadol/oxycodone as needed PMH significant for breast cancer, migraines, and osteoporosis.  Patient is accompanied by her husband this afternoon.  Past Medical History:  Diagnosis Date   Abnormal mammogram    Allergic rhinitis 10/30/2018   Allergy    Tetracycoline. Sulfa   Anxiety in acute stress reaction 10/30/2018   Arthritis    Atypical chest pain 10/30/2018   Cancer [facility])    breast    Cataract    Both eyes surgery   Chest pain    "not cardiac related"   Colon polyps    Dyspnea    DOE   Family history of breast cancer 07/05/2020   Family history of pancreatic cancer 07/05/2020   Family history of prostate cancer 07/05/2020   Genetic testing 07/11/2020   GERD (gastroesophageal reflux disease) 10/30/2018   History of kidney stones    passed   Hypertension 10/30/2018   Hyperthyroidism    Hypothyroidism 10/30/2018   Migraine 10/30/2018   Osteoporosis 10/30/2018   Personal history of chemotherapy    Personal history of radiation therapy    Pneumonia    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   Vitamin D deficiency 10/30/2018    Patient Active Problem List   Diagnosis Date Noted   Port-A-Cath in place 09/12/2020   Genetic testing 07/11/2020   Family history of breast cancer 07/05/2020   Family history of prostate cancer 07/05/2020   Malignant neoplasm of upper-outer quadrant of left breast in female, estrogen receptor negative (HCC) 06/30/2020   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/23/2019   Primary osteoarthritis of both hands 06/23/2019   Trigger  thumb of left hand 06/23/2019   Polyarthralgia 06/23/2019   Hypertension 10/30/2018   Atypical chest pain 10/30/2018   Migraine 10/30/2018   Hypothyroidism 10/30/2018   Osteoporosis 10/30/2018   GERD (gastroesophageal reflux disease) 10/30/2018   Anxiety in acute stress reaction 10/30/2018   Allergic rhinitis 10/30/2018   Vitamin D deficiency 10/30/2018    Past Surgical History:  Procedure Laterality Date   ABDOMINAL HYSTERECTOMY     BREAST BIOPSY Left 06/26/2020   x2   BREAST LUMPECTOMY Left 08/15/2020   BREAST LUMPECTOMY WITH RADIOACTIVE SEED AND SENTINEL LYMPH NODE BIOPSY Left 08/15/2020   Procedure: LEFT BREAST LUMPECTOMY WITH RADIOACTIVE SEED AND SENTINEL LYMPH NODE BIOPSY;  Surgeon: Almond Lint, MD;  Location: [facility] OR;  Service: General;  Laterality: Left;  RNFA   COLONOSCOPY W/ POLYPECTOMY     ESOPHAGEAL DILATION     EYE SURGERY Bilateral    catheter with lens   HYSTERECTOMY ABDOMINAL WITH SALPINGECTOMY     KNEE SURGERY Right 09/13/2022   PORT-A-CATH REMOVAL  01/04/2021   Procedure: REMOVAL PORT-A-CATH;  Surgeon: Almond Lint, MD;  Location: [facility];  Service: General;;   PORTACATH PLACEMENT Right 08/15/2020   Procedure: INSERTION PORT-A-CATH;  Surgeon: Almond Lint, MD;  Location: [facility] OR;  Service: General;  Laterality: Right;   TONSILLECTOMY     TUBAL LIGATION      OB History   No obstetric  history on file.      Home Medications    Prior to Admission medications   Medication Sig Start Date End Date Taking? Authorizing Provider  baclofen (LIORESAL) 10 MG tablet Take 1 tablet (10 mg total) by mouth daily as needed for muscle spasms. 03/03/23  Yes Trevor Iha, FNP  ketorolac (TORADOL) 10 MG tablet Take 1 tablet (10 mg total) by mouth every 8 (eight) hours as needed for up to 7 days. 03/03/23 03/10/23 Yes Trevor Iha, FNP  acetaminophen (TYLENOL) 500 MG tablet Take 1,000 mg by mouth every 6 (six) hours as needed for moderate pain.    [provider]  albuterol (VENTOLIN HFA) 108 (90 Base) MCG/ACT inhaler Inhale 2 puffs into the lungs every 6 (six) hours as needed for wheezing. 08/16/22   Christen Butter, NP  aspirin EC (BAYER ASPIRIN) 325 MG tablet TAKE 1 TABLET BY MOUTH TWICE DAILY WITH FOOD TO DECREASE RISK OF BLOOD CLOTS. TAKE 1 MONTH POST OP 08/09/22   [provider]  Calcium-Vitamin D-Vitamin K 500-100-40 MG-UNT-MCG CHEW Chew 2 tablets by mouth daily.    [provider]  celecoxib (CELEBREX) 200 MG capsule Take by mouth.    [provider]  cetirizine (ZYRTEC) 10 MG tablet Take 1 tablet (10 mg total) by mouth daily as needed for allergies. 12/11/21   Christen Butter, NP  denosumab (PROLIA) 60 MG/ML SOSY injection Inject 60 mg into the skin every 6 (six) months.    [provider]  dorzolamide-timolol (COSOPT) 2-0.5 % ophthalmic solution Place 1 drop into both eyes 2 (two) times daily. 06/03/22 06/03/23  [provider]  escitalopram (LEXAPRO) 5 MG tablet Take 1 tablet by mouth once daily 10/14/22   Christen Butter, NP  esomeprazole (NEXIUM) 40 MG capsule Take 1 capsule by mouth once daily 12/10/22   Christen Butter, NP  fluticasone (FLONASE) 50 MCG/ACT nasal spray Place 2 sprays into both nostrils daily. 06/12/21 09/20/22  Clayborne Dana, NP  hydrochlorothiazide (HYDRODIURIL) 25 MG tablet TAKE 1 TABLET BY MOUTH ONCE DAILY AS NEEDED SWELLING 12/11/21   Christen Butter, NP  latanoprost (XALATAN) 0.005 % ophthalmic solution  10/09/21   [provider]  levothyroxine (SYNTHROID) 75 MCG tablet Take 1 tablet (75 mcg total) by mouth daily before breakfast. 07/16/22   Breeback, Jade L, PA-C  NIFEdipine (ADALAT CC) 30 MG 24 hr tablet TAKE 1 TABLET BY MOUTH ONCE DAILY . APPOINTMENT REQUIRED FOR FUTURE REFILLS 12/26/22   Christen Butter, NP  nitroGLYCERIN (NITROSTAT) 0.4 MG SL tablet Place 1 tablet (0.4 mg total) under the tongue every 5 (five) minutes as needed for chest pain. 06/12/21   Clayborne Dana, NP  oxyCODONE (OXY  IR/ROXICODONE) 5 MG immediate release tablet Take by mouth.    [provider]  Probiotic Product (PROBIOTIC PO) Take 1 capsule by mouth daily.    [provider]  rosuvastatin (CRESTOR) 20 MG tablet Take 1 tablet (20 mg total) by mouth daily. 10/09/22   Christen Butter, NP  SUMAtriptan (IMITREX) 100 MG tablet Take 0.5-1 tablets (50-100 mg total) by mouth every 2 (two) hours as needed for migraine (Max 2 pills per 24 hours). 06/12/21   Clayborne Dana, NP  vitamin B-12 (CYANOCOBALAMIN) 500 MCG tablet Take 500 mcg by mouth daily.     [provider]    Family History Family History  Problem Relation Age of Onset   High blood pressure Mother    Breast cancer Mother  dx before 92   Alzheimer's disease Mother    Cancer Mother    Heart attack Father    Prostate cancer Brother 44   Breast cancer Maternal Aunt        dx 77s   Cancer Maternal Aunt    Breast cancer Maternal Aunt        dx 32s   Breast cancer Maternal Grandmother        dx after 50   Cancer Maternal Grandmother    Leukemia Paternal Grandfather        dx 53s-80s   Pancreatic cancer Cousin        maternal; dx 84s   Cancer Maternal Aunt 50       unknown type   Breast cancer Cousin        paternal; dx 38s   Cancer Maternal Aunt    Diabetes Daughter     Social History Social History   Tobacco Use   Smoking status: Never   Smokeless tobacco: Never  Vaping Use   Vaping Use: Never used  Substance Use Topics   Alcohol use: Not Currently    Comment: rarely   Drug use: Never     Allergies   Sulfamethoxazole-trimethoprim and Tetracyclines & related   Review of Systems Review of Systems  Musculoskeletal:  Positive for back pain.     Physical Exam Triage Vital Signs ED Triage Vitals  Enc Vitals Group     BP 03/03/23 1254 100/68     Pulse Rate 03/03/23 1254 88     Resp 03/03/23 1254 17     Temp 03/03/23 1254 97.8 F (36.6 C)     Temp src --      SpO2 03/03/23 1254 98 %      Weight --      Height --      Head Circumference --      Peak Flow --      Pain Score 03/03/23 1255 1     Pain Loc --      Pain Edu? --      Excl. in GC? --    No data found.  Updated Vital Signs BP 100/68 (BP Location: Right Arm)   Pulse 88   Temp 97.8 F (36.6 C)   Resp 17   SpO2 98%    Physical Exam Vitals and nursing note reviewed.  Constitutional:      Appearance: Normal appearance. She is normal weight.  HENT:     Head: Normocephalic and atraumatic.     Mouth/Throat:     Mouth: Mucous membranes are moist.     Pharynx: Oropharynx is clear.  Eyes:     Extraocular Movements: Extraocular movements intact.     Conjunctiva/sclera: Conjunctivae normal.     Pupils: Pupils are equal, round, and reactive to light.  Cardiovascular:     Rate and Rhythm: Normal rate and regular rhythm.     Pulses: Normal pulses.     Heart sounds: Normal heart sounds.  Pulmonary:     Effort: Pulmonary effort is normal.     Breath sounds: Normal breath sounds. No wheezing, rhonchi or rales.  Musculoskeletal:        General: Normal range of motion.     Cervical back: Normal range of motion and neck supple.     Comments: Patient reporting back pain at mid thoracic spine between T10-T6: TTP over paraspinous muscles; patient reporting pain at lumbar spine L2-L5: TTP over paraspinous muscles of spinal erectors  Skin:    General: Skin is warm and dry.  Neurological:     General: No focal deficit present.     Mental Status: She is alert and oriented to person, place, and time. Mental status is at baseline.  Psychiatric:        Mood and Affect: Mood normal.        Behavior: Behavior normal.      UC Treatments / Results  Labs (all labs ordered are listed, but only abnormal results are displayed) Labs Reviewed - No data to display  EKG   Radiology DG Thoracic Spine 2 View  Result Date: 03/03/2023 CLINICAL DATA:  Low back pain for 4 days EXAM: THORACIC SPINE 2 VIEWS COMPARISON:  None  Available. FINDINGS: Severely exaggerated thoracic kyphosis. Moderate wedge-shaped compression deformity within the midthoracic spine, approximately T8, age indeterminate. No additional thoracic spine fracture is seen. Bones are diffusely demineralized. Mild degenerative disc disease. IMPRESSION: 1. Moderate wedge-shaped compression deformity within the midthoracic spine, approximately T8, age indeterminate. 2. Severely exaggerated thoracic kyphosis. Electronically Signed   By: Duanne Guess D.O.   On: 03/03/2023 13:52   DG Lumbar Spine Complete  Result Date: 03/03/2023 CLINICAL DATA:  Low back pain EXAM: LUMBAR SPINE - COMPLETE 4+ VIEW COMPARISON:  None Available. FINDINGS: Five lumbar type vertebral bodies. Mild superior endplate compression deformity at L2, age indeterminate. Cortical irregularity at the anterosuperior corner of the L4 vertebral body, age indeterminate. Intervertebral disc heights are relatively well preserved. Mild facet arthropathy. IMPRESSION: 1. Mild superior endplate compression deformity at L2, age indeterminate. 2. Cortical irregularity at the anterosuperior corner of the L4 vertebral body, age indeterminate. Electronically Signed   By: Duanne Guess D.O.   On: 03/03/2023 13:50    Procedures Procedures (including critical care time)  Medications Ordered in UC Medications  ketorolac (TORADOL) injection 60 mg (60 mg Intramuscular Given 03/03/23 1413)    Initial Impression / Assessment and Plan / UC Course  I have reviewed the triage vital signs and the nursing notes.  Pertinent labs & imaging results that were available during my care of the patient were reviewed by me and considered in my medical decision making (see chart for details).     MDM: 1.  Acute midline low back pain without sciatica-x-ray of LS spine revealed above-IM Toradol 60 mg given once in clinic and prior to discharge; 2.  Back strain, initial encounter-thoracic spine x-ray revealed above-Rx'd  Toradol 10 mg every 8 hours, as needed for back strain; 3.  Muscle spasm of back-Rx'd Baclofen 10 mg daily, as needed for accompanying back spasms. Advised patient of back x-ray results with hardcopy and images provided to patient.  Advised patient to discontinue Celebrex and tizanidine while taking Toradol and baclofen.  Encouraged patient to keep PCP appointment scheduled for tomorrow Tuesday, 03/04/2023.  Advised patient may take Toradol daily or as needed for back pain.  Advised patient may take Baclofen daily or as needed for accompanying back spasms.  Advised patient to avoid offending activities that could exacerbate affected areas of back for the next 7 to 10 days.  Encouraged increase daily water intake to 64 ounces per day while taking these medications.  Advised if symptoms worsen and/or unresolved please follow-up with PCP for further evaluation.  Patient discharged home, hemodynamically stable.   Final Clinical Impressions(s) / UC Diagnoses   Final diagnoses:  Acute midline low back pain without sciatica  Muscle spasm of back  Back strain, initial encounter  Discharge Instructions      Advised patient of back x-ray results with hardcopy and images provided to patient.  Advised patient to discontinue Celebrex and tizanidine while taking Toradol and baclofen.  Encouraged patient to keep PCP appointment scheduled for tomorrow Tuesday, 03/04/2023.  Advised patient may take Toradol daily or as needed for back pain.  Advised patient may take Baclofen daily or as needed for accompanying back spasms.  Advised patient to avoid offending activities that could exacerbate affected areas of back for the next 7 to 10 days.  Encouraged increase daily water intake to 64 ounces per day while taking these medications.  Advised if symptoms worsen and/or unresolved please follow-up with PCP for further evaluation.     ED Prescriptions     Medication Sig Dispense Auth. Provider   ketorolac (TORADOL)  10 MG tablet Take 1 tablet (10 mg total) by mouth every 8 (eight) hours as needed for up to 7 days. 21 tablet Trevor Iha, FNP   baclofen (LIORESAL) 10 MG tablet Take 1 tablet (10 mg total) by mouth daily as needed for muscle spasms. 21 each Trevor Iha, FNP      PDMP not reviewed this encounter.   Trevor Iha, FNP 03/03/23 1445

## 2023-03-03 NOTE — ED Triage Notes (Addendum)
Pt c/o back pain since Thursday. Says she worked in the yard all day but no specific injury. Hurts worse with movement. Ice/heat and tramadol, oxycodone prn. Gets injections for osteoporosis.

## 2023-03-03 NOTE — Discharge Instructions (Addendum)
Advised patient of back x-ray results with hardcopy and images provided to patient.  Advised patient to discontinue Celebrex and tizanidine while taking Toradol and baclofen.  Encouraged patient to keep PCP appointment scheduled for tomorrow Tuesday, 03/04/2023.  Advised patient may take Toradol daily or as needed for back pain.  Advised patient may take Baclofen daily or as needed for accompanying back spasms.  Advised patient to avoid offending activities that could exacerbate affected areas of back for the next 7 to 10 days.  Encouraged increase daily water intake to 64 ounces per day while taking these medications.  Advised if symptoms worsen and/or unresolved please follow-up with PCP for further evaluation.

## 2023-03-04 ENCOUNTER — Ambulatory Visit (INDEPENDENT_AMBULATORY_CARE_PROVIDER_SITE_OTHER): Payer: Medicare Other | Admitting: Sports Medicine

## 2023-03-04 ENCOUNTER — Telehealth: Payer: Self-pay | Admitting: Medical-Surgical

## 2023-03-04 DIAGNOSIS — M545 Low back pain, unspecified: Secondary | ICD-10-CM | POA: Diagnosis not present

## 2023-03-04 DIAGNOSIS — M47816 Spondylosis without myelopathy or radiculopathy, lumbar region: Secondary | ICD-10-CM | POA: Insufficient documentation

## 2023-03-04 MED ORDER — PREDNISONE 50 MG PO TABS
ORAL_TABLET | ORAL | 0 refills | Status: DC
Start: 2023-03-04 — End: 2023-05-08

## 2023-03-04 MED ORDER — HYDROCODONE-ACETAMINOPHEN 5-325 MG PO TABS: 1.0000 | ORAL_TABLET | Freq: Three times a day (TID) | ORAL | 0 refills | Status: DC | PRN

## 2023-03-04 NOTE — Telephone Encounter (Signed)
Patient called stating it was discussed during her appointment to start Hydrocodone.  Patient's pharmacy has not received a prescription for it and it is not listed on her AVS. She would like to know if she will be starting this medication.  Pharmacy - [facility] [address], [place] - [address]

## 2023-03-04 NOTE — Assessment & Plan Note (Signed)
Pleasant 77 year old female, she has had several days of increasing mid low back pain without radiculopathy. Pain is worse with sitting, flexion, Valsalva. No bowel or bladder dysfunction or saddle numbness, her exam is normal with the exception of accentuated kyphosis in the thoracic spine, she has a negative percussion test along every single spinous process lowering the likelihood that this is a vertebral compression fracture. We will switch her from Toradol prescribed in urgent care to prednisone, adding formal PT, return to see me in 4 to 6 weeks.

## 2023-03-04 NOTE — Addendum Note (Signed)
Addended by: Monica Becton on: 03/04/2023 02:39 PM   Modules accepted: Orders

## 2023-03-04 NOTE — Progress Notes (Addendum)
    Procedures performed today:    None.  Independent interpretation of notes and tests performed by another provider:   None.  Brief History, Exam, Impression, and Recommendations:    Acute bilateral low back pain without sciatica Pleasant 77 year old female, she has had several days of increasing mid low back pain without radiculopathy. Pain is worse with sitting, flexion, Valsalva. No bowel or bladder dysfunction or saddle numbness, her exam is normal with the exception of accentuated kyphosis in the thoracic spine, she has a negative percussion test along every single spinous process lowering the likelihood that this is a vertebral compression fracture. We will switch her from Toradol prescribed in urgent care to prednisone, adding formal PT, return to see me in 4 to 6 weeks.    ____________________________________________ Ihor Austin. Benjamin Stain, M.D., ABFM., CAQSM., AME. Primary Care and Sports Medicine [facility]  Adjunct Professor of Family Medicine  [employer]  Restaurant manager, fast food

## 2023-03-06 ENCOUNTER — Encounter: Payer: Self-pay | Admitting: Sports Medicine

## 2023-03-06 ENCOUNTER — Encounter: Payer: Self-pay | Admitting: Hematology and Oncology

## 2023-03-06 DIAGNOSIS — M545 Low back pain, unspecified: Secondary | ICD-10-CM

## 2023-03-06 MED ORDER — TRAMADOL HCL 50 MG PO TABS
50.0000 mg | ORAL_TABLET | Freq: Three times a day (TID) | ORAL | 0 refills | Status: DC | PRN
Start: 2023-03-06 — End: 2024-04-07

## 2023-03-07 ENCOUNTER — Telehealth: Payer: Self-pay

## 2023-03-07 NOTE — Telephone Encounter (Signed)
S/w pt regarding MyChart message. Advised pt she should call her PCP or sports medicine Dr for further eval of back pain and discuss alk phos. She verbalized agreement and understanding.

## 2023-03-10 ENCOUNTER — Encounter (INDEPENDENT_AMBULATORY_CARE_PROVIDER_SITE_OTHER): Payer: Medicare Other | Admitting: Sports Medicine

## 2023-03-10 DIAGNOSIS — M545 Low back pain, unspecified: Secondary | ICD-10-CM | POA: Diagnosis not present

## 2023-03-10 NOTE — Telephone Encounter (Signed)
I spent 5 total minutes of online digital evaluation and management services in this patient-initiated request for online care. 

## 2023-03-15 ENCOUNTER — Ambulatory Visit: Payer: Medicare Other

## 2023-03-15 DIAGNOSIS — C50919 Malignant neoplasm of unspecified site of unspecified female breast: Secondary | ICD-10-CM | POA: Diagnosis not present

## 2023-03-15 DIAGNOSIS — M5136 Other intervertebral disc degeneration, lumbar region: Secondary | ICD-10-CM | POA: Diagnosis not present

## 2023-03-15 DIAGNOSIS — M545 Low back pain, unspecified: Secondary | ICD-10-CM | POA: Diagnosis not present

## 2023-03-15 DIAGNOSIS — M48061 Spinal stenosis, lumbar region without neurogenic claudication: Secondary | ICD-10-CM | POA: Diagnosis not present

## 2023-03-15 DIAGNOSIS — M47816 Spondylosis without myelopathy or radiculopathy, lumbar region: Secondary | ICD-10-CM | POA: Diagnosis not present

## 2023-03-18 ENCOUNTER — Ambulatory Visit: Payer: Medicare Other | Attending: Sports Medicine | Admitting: Physical Therapy

## 2023-03-18 ENCOUNTER — Other Ambulatory Visit: Payer: Self-pay

## 2023-03-18 DIAGNOSIS — R293 Abnormal posture: Secondary | ICD-10-CM | POA: Insufficient documentation

## 2023-03-18 DIAGNOSIS — R262 Difficulty in walking, not elsewhere classified: Secondary | ICD-10-CM | POA: Insufficient documentation

## 2023-03-18 DIAGNOSIS — M545 Low back pain, unspecified: Secondary | ICD-10-CM | POA: Insufficient documentation

## 2023-03-18 DIAGNOSIS — M546 Pain in thoracic spine: Secondary | ICD-10-CM | POA: Insufficient documentation

## 2023-03-18 DIAGNOSIS — M62830 Muscle spasm of back: Secondary | ICD-10-CM | POA: Insufficient documentation

## 2023-03-18 DIAGNOSIS — M6281 Muscle weakness (generalized): Secondary | ICD-10-CM | POA: Insufficient documentation

## 2023-03-18 NOTE — Therapy (Signed)
OUTPATIENT PHYSICAL THERAPY THORACOLUMBAR EVALUATION   Patient Name: Cindy [name] MRN: [id number] DOB:1945-09-19, 77 y.o., female Today's Date: 03/18/2023  END OF SESSION:  PT End of Session - 03/18/23 1019     Visit Number 1    PT Start Time 1017    PT Stop Time 1100    PT Time Calculation (min) 43 min             Past Medical History:  Diagnosis Date   Abnormal mammogram    Allergic rhinitis 10/30/2018   Allergy    Tetracycoline. Sulfa   Anxiety in acute stress reaction 10/30/2018   Arthritis    Atypical chest pain 10/30/2018   Cancer [facility])    breast    Cataract    Both eyes surgery   Chest pain    "not cardiac related"   Colon polyps    Dyspnea    DOE   Family history of breast cancer 07/05/2020   Family history of pancreatic cancer 07/05/2020   Family history of prostate cancer 07/05/2020   Genetic testing 07/11/2020   GERD (gastroesophageal reflux disease) 10/30/2018   History of kidney stones    passed   Hypertension 10/30/2018   Hyperthyroidism    Hypothyroidism 10/30/2018   Migraine 10/30/2018   Osteoporosis 10/30/2018   Personal history of chemotherapy    Personal history of radiation therapy    Pneumonia    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   Vitamin D deficiency 10/30/2018   Past Surgical History:  Procedure Laterality Date   ABDOMINAL HYSTERECTOMY     BREAST BIOPSY Left 06/26/2020   x2   BREAST LUMPECTOMY Left 08/15/2020   BREAST LUMPECTOMY WITH RADIOACTIVE SEED AND SENTINEL LYMPH NODE BIOPSY Left 08/15/2020   Procedure: LEFT BREAST LUMPECTOMY WITH RADIOACTIVE SEED AND SENTINEL LYMPH NODE BIOPSY;  Surgeon: Almond Lint, MD;  Location: [facility] OR;  Service: General;  Laterality: Left;  RNFA   COLONOSCOPY W/ POLYPECTOMY     ESOPHAGEAL DILATION     EYE SURGERY Bilateral    catheter with lens   HYSTERECTOMY ABDOMINAL WITH SALPINGECTOMY     KNEE SURGERY Right 09/13/2022   PORT-A-CATH REMOVAL  01/04/2021   Procedure: REMOVAL  PORT-A-CATH;  Surgeon: Almond Lint, MD;  Location: [facility];  Service: General;;   PORTACATH PLACEMENT Right 08/15/2020   Procedure: INSERTION PORT-A-CATH;  Surgeon: Almond Lint, MD;  Location: [facility] OR;  Service: General;  Laterality: Right;   TONSILLECTOMY     TUBAL LIGATION     Patient Active Problem List   Diagnosis Date Noted   Acute bilateral low back pain without sciatica 03/04/2023   Port-A-Cath in place 09/12/2020   Genetic testing 07/11/2020   Family history of breast cancer 07/05/2020   Family history of prostate cancer 07/05/2020   Malignant neoplasm of upper-outer quadrant of left breast in female, estrogen receptor negative (HCC) 06/30/2020   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/23/2019   Primary osteoarthritis of both hands 06/23/2019   Trigger thumb of left hand 06/23/2019   Polyarthralgia 06/23/2019   Hypertension 10/30/2018   Atypical chest pain 10/30/2018   Migraine 10/30/2018   Hypothyroidism 10/30/2018   Osteoporosis 10/30/2018   GERD (gastroesophageal reflux disease) 10/30/2018   Anxiety in acute stress reaction 10/30/2018   Allergic rhinitis 10/30/2018   Vitamin D deficiency 10/30/2018    PCP: Christen Butter, NP  REFERRING PROVIDER: Monica Becton,*  REFERRING DIAG: M54.50 (ICD-10-CM) - Acute bilateral low back pain without sciatica  Rationale for Evaluation  and Treatment: Rehabilitation  THERAPY DIAG:  Abnormal posture  Pain in thoracic spine  Muscle spasm of back  Muscle weakness (generalized)  Difficulty in walking, not elsewhere classified  ONSET DATE: ~1 month  SUBJECTIVE:                                                                                                                                                                                           SUBJECTIVE STATEMENT: Pt states she is not sure what she did to her back. Pt did x-rays and recently got MRI. Some days she's okay and other days she has a  hard time getting up and moving around. Pt first noticed difficulty getting out of bed and getting up/down. Pt was put on muscle relaxers and pain medication but didn't seem to help. She was given a shot which did seem to help. Dr. Karie Schwalbe told her he thinks it's primarily muscular in nature.   PERTINENT HISTORY:  N/a; January 2024 R TKA  PAIN:  Are you having pain? Yes: NPRS scale: 1 or 2 currently, at worst 7/10 Pain location: Center lower midback Pain description: "almost like a spasm or catches" Aggravating factors: trying to get up (off bed primarily), sit<>stand, walking Relieving factors: heating pad/ice   PRECAUTIONS: None  WEIGHT BEARING RESTRICTIONS: No  FALLS:  Has patient fallen in last 6 months? No  LIVING ENVIRONMENT: Lives with: lives with their family Lives in: House/apartment Stairs: Yes: External: 3 steps; none Has following equipment at home: None  OCCUPATION: Paint and work in the yard  PLOF: Independent  PATIENT GOALS: Wants to return to painting and working in the yard and then get to walking  NEXT MD VISIT: n/a  OBJECTIVE:   DIAGNOSTIC FINDINGS:  03/03/23 Thoracic Spine x-ray IMPRESSION: 1. Moderate wedge-shaped compression deformity within the midthoracic spine, approximately T8, age indeterminate. 2. Severely exaggerated thoracic kyphosis.   03/03/23 Lumbar spine x-ray IMPRESSION:  1. Mild superior endplate compression deformity at L2, age  indeterminate.  2. Cortical irregularity at the anterosuperior corner of the L4  vertebral body, age indeterminate.   PATIENT SURVEYS:  FOTO 48; predicted 64  SCREENING FOR RED FLAGS: Bowel or bladder incontinence: No Spinal tumors: No Cauda equina syndrome: No Compression fracture: No Abdominal aneurysm: No  COGNITION: Overall cognitive status: Within functional limits for tasks assessed     SENSATION: WFL except decreased sensation R lateral calf since knee replacement   MUSCLE LENGTH: Hamstrings:  WNL Thomas test: Did not assess  POSTURE: rounded shoulders, increased lumbar lordosis, and increased thoracic kyphosis  PALPATION: L>R thoracic paraspinal tightness and trigger points No overt tenderness with  UPAs and CPAs  LUMBAR ROM:   AROM eval  Flexion 100%  Extension 80%  Right lateral flexion 80%  Left lateral flexion 100%  Right rotation 100%  Left rotation 100%   (Blank rows = not tested)  LOWER EXTREMITY ROM:     Active  Right eval Left eval  Hip flexion    Hip extension    Hip abduction    Hip adduction    Hip internal rotation    Hip external rotation    Knee flexion    Knee extension    Ankle dorsiflexion    Ankle plantarflexion    Ankle inversion    Ankle eversion     (Blank rows = not tested)  LOWER EXTREMITY MMT:    MMT Right eval Left eval  Hip flexion 3+ 4  Hip extension 3+ 4-  Hip abduction 3+ 3+  Hip adduction    Hip internal rotation    Hip external rotation    Knee flexion 4- 4+  Knee extension 4- 4+  Ankle dorsiflexion    Ankle plantarflexion    Ankle inversion    Ankle eversion     (Blank rows = not tested)   UPPER EXTREMITY MMT:  MMT Right eval Left eval  Shoulder flexion 5 5  Shoulder extension 4- 4-  Shoulder abduction 4+ 4+  Shoulder adduction    Shoulder internal rotation 5 5  Shoulder external rotation 3+ 4-  Middle trapezius 3 3  Lower trapezius    Elbow flexion    Elbow extension    Wrist flexion    Wrist extension    Wrist ulnar deviation    Wrist radial deviation    Wrist pronation    Wrist supination    Grip strength     (Blank rows = not tested)   LUMBAR SPECIAL TESTS:  Did not assess  FUNCTIONAL TESTS:  5 times sit to stand: TBA  GAIT: WNL  TODAY'S TREATMENT:                                                                                                                              DATE: 03/18/23 See HEP below    PATIENT EDUCATION:  Education details: Exam findings, POC, initial  HEP Person educated: Patient Education method: Explanation, Demonstration, and Handouts Education comprehension: verbalized understanding, returned demonstration, and needs further education  HOME EXERCISE PROGRAM: Access Code: [email or web address] URL: [email or web address] Date: 03/18/2023 Prepared by: Vernon Prey April Kirstie Peri  Exercises - Sidelying Open Book Thoracic Lumbar Rotation and Extension  - 1 x daily - 7 x weekly - 1 sets - 10 reps - 3 sec hold - Supine Lower Trunk Rotation  - 1 x daily - 7 x weekly - 1 sets - 10 reps - 3 sec hold - Supine Piriformis Stretch with Foot on Ground  - 1 x daily - 7 x weekly - 1 sets - 30 sec hold - Supine Double Knee to  Chest  - 1 x daily - 7 x weekly - 1 sets - 30 sec hold - Standing Paraspinals Mobilization with Small Ball on Wall  - 1 x daily - 7 x weekly - 1 sets - 30 sec hold  ASSESSMENT:  CLINICAL IMPRESSION: Patient is a 77 y.o. F who was seen today for physical therapy evaluation and treatment for lower midback pain. PMH significant for recent completion of rehab for R TKA in January. Assessment significant for L>R thoracic paraspinal tightness/trigger points, increased thoracic kyphosis with scoliotic curve, weak periscapular muscles and R>L LE weakness. Pt will benefit from PT to address these deficits to improve her comfort with all transfers and mobility.   OBJECTIVE IMPAIRMENTS: decreased activity tolerance, decreased endurance, decreased mobility, difficulty walking, decreased strength, increased fascial restrictions, increased muscle spasms, impaired UE functional use, improper body mechanics, postural dysfunction, and pain.   ACTIVITY LIMITATIONS: transfers, bed mobility, and locomotion level  PARTICIPATION LIMITATIONS: meal prep, cleaning, community activity, and yard work  PERSONAL FACTORS: Age, Fitness, Past/current experiences, and Time since onset of injury/illness/exacerbation are also affecting patient's functional  outcome.   REHAB POTENTIAL: Good  CLINICAL DECISION MAKING: Evolving/moderate complexity  EVALUATION COMPLEXITY: Moderate   GOALS: Goals reviewed with patient? Yes  SHORT TERM GOALS: Target date: 04/15/2023  Pt will be ind with initial HEP Baseline: Goal status: INITIAL  2.  Pt will report at least 50% improvement in back pain Baseline:  Goal status: INITIAL  3.  Pt will be able to transfer in/out of bed without pain Baseline:  Goal status: INITIAL   LONG TERM GOALS: Target date: 05/13/2023   Pt will be ind with management and progression of HEP Baseline:  Goal status: INITIAL  2.  Pt will be able to amb at least 1 mile per her personal goals s/p TKA Baseline:  Goal status: INITIAL  3.  Pt will be able to perform all transfers without pain Baseline:  Goal status: INITIAL  4.  Pt will have improved FOTO to >/=63 Baseline:  Goal status: INITIAL  5.  Pt will have 5x STS of </=13 sec to demo increased functional LE strength Baseline:  Goal status: INITIAL   PLAN:  PT FREQUENCY: 2x/week  PT DURATION: 8 weeks  PLANNED INTERVENTIONS: Therapeutic exercises, Therapeutic activity, Neuromuscular re-education, Balance training, Gait training, Patient/Family education, Self Care, Joint mobilization, Stair training, Dry Needling, Electrical stimulation, Spinal mobilization, Cryotherapy, Moist heat, Taping, Ionotophoresis 4mg /ml Dexamethasone, Manual therapy, and Re-evaluation.  PLAN FOR NEXT SESSION: Assess response to stretching HEP. Initiate core and midback strengthening. Manual work/TPDN for thoracic spine. Assess 5x STS   Donye Campanelli April Ma L Antares, PT 03/18/2023, 1:08 PM

## 2023-03-20 ENCOUNTER — Ambulatory Visit: Payer: Medicare Other | Admitting: Physical Therapy

## 2023-03-20 ENCOUNTER — Encounter: Payer: Self-pay | Admitting: Physical Therapy

## 2023-03-20 DIAGNOSIS — R262 Difficulty in walking, not elsewhere classified: Secondary | ICD-10-CM | POA: Diagnosis not present

## 2023-03-20 DIAGNOSIS — M546 Pain in thoracic spine: Secondary | ICD-10-CM

## 2023-03-20 DIAGNOSIS — R293 Abnormal posture: Secondary | ICD-10-CM | POA: Diagnosis not present

## 2023-03-20 DIAGNOSIS — M62830 Muscle spasm of back: Secondary | ICD-10-CM | POA: Diagnosis not present

## 2023-03-20 DIAGNOSIS — M6281 Muscle weakness (generalized): Secondary | ICD-10-CM | POA: Diagnosis not present

## 2023-03-20 DIAGNOSIS — M545 Low back pain, unspecified: Secondary | ICD-10-CM | POA: Diagnosis not present

## 2023-03-20 NOTE — Therapy (Signed)
OUTPATIENT PHYSICAL THERAPY THORACOLUMBAR TREATMENT   Patient Name: Cindy [name] MRN: [id number] DOB:28-Jan-1946, 77 y.o., female Today's Date: 03/20/2023  END OF SESSION:  PT End of Session - 03/20/23 0850     Visit Number 2    PT Start Time 0847    PT Stop Time 0930    PT Time Calculation (min) 43 min    Behavior During Therapy [facility] for tasks assessed/performed              Past Medical History:  Diagnosis Date   Abnormal mammogram    Allergic rhinitis 10/30/2018   Allergy    Tetracycoline. Sulfa   Anxiety in acute stress reaction 10/30/2018   Arthritis    Atypical chest pain 10/30/2018   Cancer [facility]    breast    Cataract    Both eyes surgery   Chest pain    "not cardiac related"   Colon polyps    Dyspnea    DOE   Family history of breast cancer 07/05/2020   Family history of pancreatic cancer 07/05/2020   Family history of prostate cancer 07/05/2020   Genetic testing 07/11/2020   GERD (gastroesophageal reflux disease) 10/30/2018   History of kidney stones    passed   Hypertension 10/30/2018   Hyperthyroidism    Hypothyroidism 10/30/2018   Migraine 10/30/2018   Osteoporosis 10/30/2018   Personal history of chemotherapy    Personal history of radiation therapy    Pneumonia    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   Vitamin D deficiency 10/30/2018   Past Surgical History:  Procedure Laterality Date   ABDOMINAL HYSTERECTOMY     BREAST BIOPSY Left 06/26/2020   x2   BREAST LUMPECTOMY Left 08/15/2020   BREAST LUMPECTOMY WITH RADIOACTIVE SEED AND SENTINEL LYMPH NODE BIOPSY Left 08/15/2020   Procedure: LEFT BREAST LUMPECTOMY WITH RADIOACTIVE SEED AND SENTINEL LYMPH NODE BIOPSY;  Surgeon: Almond Lint, MD;  Location: [facility] OR;  Service: General;  Laterality: Left;  RNFA   COLONOSCOPY W/ POLYPECTOMY     ESOPHAGEAL DILATION     EYE SURGERY Bilateral    catheter with lens   HYSTERECTOMY ABDOMINAL WITH SALPINGECTOMY     KNEE SURGERY Right  09/13/2022   PORT-A-CATH REMOVAL  01/04/2021   Procedure: REMOVAL PORT-A-CATH;  Surgeon: Almond Lint, MD;  Location: [facility];  Service: General;;   PORTACATH PLACEMENT Right 08/15/2020   Procedure: INSERTION PORT-A-CATH;  Surgeon: Almond Lint, MD;  Location: [facility] OR;  Service: General;  Laterality: Right;   TONSILLECTOMY     TUBAL LIGATION     Patient Active Problem List   Diagnosis Date Noted   Acute bilateral low back pain without sciatica 03/04/2023   Port-A-Cath in place 09/12/2020   Genetic testing 07/11/2020   Family history of breast cancer 07/05/2020   Family history of prostate cancer 07/05/2020   Malignant neoplasm of upper-outer quadrant of left breast in female, estrogen receptor negative (HCC) 06/30/2020   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/23/2019   Primary osteoarthritis of both hands 06/23/2019   Trigger thumb of left hand 06/23/2019   Polyarthralgia 06/23/2019   Hypertension 10/30/2018   Atypical chest pain 10/30/2018   Migraine 10/30/2018   Hypothyroidism 10/30/2018   Osteoporosis 10/30/2018   GERD (gastroesophageal reflux disease) 10/30/2018   Anxiety in acute stress reaction 10/30/2018   Allergic rhinitis 10/30/2018   Vitamin D deficiency 10/30/2018    PCP: Christen Butter, NP  REFERRING PROVIDER: Monica Becton,*  REFERRING DIAG: M54.50 (ICD-10-CM) -  Acute bilateral low back pain without sciatica  Rationale for Evaluation and Treatment: Rehabilitation  THERAPY DIAG:  Abnormal posture  Pain in thoracic spine  Muscle spasm of back  Muscle weakness (generalized)  ONSET DATE: ~1 month  SUBJECTIVE:                                                                                                                                                                                           SUBJECTIVE STATEMENT: Pt states she is off and on. Yesterday was fair but in the afternoon she was in tears. "I sat and painted some. But  then after laying down a while it was excruciating to get up." Pt states she's been coughing due to nasal drainage and she can feel it.   PERTINENT HISTORY:  N/a; January 2024 R TKA  PAIN:  Are you having pain? Yes: NPRS scale: 2, at worst 7/10 Pain location: Center lower midback Pain description: "almost like a spasm or catches" Aggravating factors: trying to get up (off bed primarily), sit<>stand, walking Relieving factors: heating pad/ice   PRECAUTIONS: None  WEIGHT BEARING RESTRICTIONS: No  FALLS:  Has patient fallen in last 6 months? No  LIVING ENVIRONMENT: Lives with: lives with their family Lives in: House/apartment Stairs: Yes: External: 3 steps; none Has following equipment at home: None  OCCUPATION: Paint and work in the yard  PLOF: Independent  PATIENT GOALS: Wants to return to painting and working in the yard and then get to walking  NEXT MD VISIT: n/a  OBJECTIVE:   DIAGNOSTIC FINDINGS:  03/03/23 Thoracic Spine x-ray IMPRESSION: 1. Moderate wedge-shaped compression deformity within the midthoracic spine, approximately T8, age indeterminate. 2. Severely exaggerated thoracic kyphosis.   03/03/23 Lumbar spine x-ray IMPRESSION:  1. Mild superior endplate compression deformity at L2, age  indeterminate.  2. Cortical irregularity at the anterosuperior corner of the L4  vertebral body, age indeterminate.   PATIENT SURVEYS:  FOTO 48; predicted 63   SENSATION: WFL except decreased sensation R lateral calf since knee replacement   MUSCLE LENGTH: Hamstrings: WNL Thomas test: Did not assess  POSTURE: rounded shoulders, increased lumbar lordosis, and increased thoracic kyphosis  PALPATION: L>R thoracic paraspinal tightness and trigger points No overt tenderness with UPAs and CPAs  LUMBAR ROM:   AROM eval  Flexion 100%  Extension 80%  Right lateral flexion 80%  Left lateral flexion 100%  Right rotation 100%  Left rotation 100%   (Blank rows = not  tested)  LOWER EXTREMITY ROM:     Active  Right eval Left eval  Hip flexion    Hip extension    Hip  abduction    Hip adduction    Hip internal rotation    Hip external rotation    Knee flexion    Knee extension    Ankle dorsiflexion    Ankle plantarflexion    Ankle inversion    Ankle eversion     (Blank rows = not tested)  LOWER EXTREMITY MMT:    MMT Right eval Left eval  Hip flexion 3+ 4  Hip extension 3+ 4-  Hip abduction 3+ 3+  Hip adduction    Hip internal rotation    Hip external rotation    Knee flexion 4- 4+  Knee extension 4- 4+  Ankle dorsiflexion    Ankle plantarflexion    Ankle inversion    Ankle eversion     (Blank rows = not tested)   UPPER EXTREMITY MMT:  MMT Right eval Left eval  Shoulder flexion 5 5  Shoulder extension 4- 4-  Shoulder abduction 4+ 4+  Shoulder adduction    Shoulder internal rotation 5 5  Shoulder external rotation 3+ 4-  Middle trapezius 3 3  Lower trapezius    Elbow flexion    Elbow extension    Wrist flexion    Wrist extension    Wrist ulnar deviation    Wrist radial deviation    Wrist pronation    Wrist supination    Grip strength     (Blank rows = not tested)  LUMBAR SPECIAL TESTS:  Did not assess  FUNCTIONAL TESTS:  5 times sit to stand: TBA  GAIT: WNL  TODAY'S TREATMENT:    03/20/23 Nustep L5 x 5 min Prone Scap squeeze 10x3 sec "T" 10x3 sec Sidelying Open/close book 10x3 sec QL stretch leg off EOB with diaphragmatic breaths Supine PPT + diaphragmatic breathing 2x10 Low trunk rotation + diaphragmatic breathing x10 Seated thoracic extension x10 Manual therapy STM & TPR thoracolumbar paraspinal Skilled assessment and palpation for TPDN Trigger Point Dry-Needling  Treatment instructions: Expect mild to moderate muscle soreness. S/S of pneumothorax if dry needled over a lung field, and to seek immediate medical attention should they occur. Patient verbalized understanding of these instructions  and education.  Patient Consent Given: Yes Education handout provided: Previously provided Muscles treated: thoracolumbar paraspinal, upper QL Electrical stimulation performed: No Parameters: N/A Treatment response/outcome: Decreased muscle tension                                                                                                                            DATE: 03/18/23 See HEP below    PATIENT EDUCATION:  Education details: Exam findings, POC, initial HEP Person educated: Patient Education method: Explanation, Demonstration, and Handouts Education comprehension: verbalized understanding, returned demonstration, and needs further education  HOME EXERCISE PROGRAM: Access Code: [email or web address] URL: [email or web address] Date: 03/18/2023 Prepared by: Vernon Prey April Kirstie Peri  Exercises - Sidelying Open Book Thoracic Lumbar Rotation and Extension  - 1 x daily - 7 x weekly - 1 sets - 10 reps -  3 sec hold - Supine Lower Trunk Rotation  - 1 x daily - 7 x weekly - 1 sets - 10 reps - 3 sec hold - Supine Piriformis Stretch with Foot on Ground  - 1 x daily - 7 x weekly - 1 sets - 30 sec hold - Supine Double Knee to Chest  - 1 x daily - 7 x weekly - 1 sets - 30 sec hold - Standing Paraspinals Mobilization with Small Ball on Wall  - 1 x daily - 7 x weekly - 1 sets - 30 sec hold  ASSESSMENT:  CLINICAL IMPRESSION: Performed trial of TPDN to address thoracolumbar muscle trigger points and spasm. Initiated postural stabilization and core work today.   OBJECTIVE IMPAIRMENTS: decreased activity tolerance, decreased endurance, decreased mobility, difficulty walking, decreased strength, increased fascial restrictions, increased muscle spasms, impaired UE functional use, improper body mechanics, postural dysfunction, and pain.   ACTIVITY LIMITATIONS: transfers, bed mobility, and locomotion level  PARTICIPATION LIMITATIONS: meal prep, cleaning, community activity, and yard  work  PERSONAL FACTORS: Age, Fitness, Past/current experiences, and Time since onset of injury/illness/exacerbation are also affecting patient's functional outcome.   REHAB POTENTIAL: Good  CLINICAL DECISION MAKING: Evolving/moderate complexity  EVALUATION COMPLEXITY: Moderate   GOALS: Goals reviewed with patient? Yes  SHORT TERM GOALS: Target date: 04/15/2023  Pt will be ind with initial HEP Baseline: Goal status: INITIAL  2.  Pt will report at least 50% improvement in back pain Baseline:  Goal status: INITIAL  3.  Pt will be able to transfer in/out of bed without pain Baseline:  Goal status: INITIAL   LONG TERM GOALS: Target date: 05/13/2023   Pt will be ind with management and progression of HEP Baseline:  Goal status: INITIAL  2.  Pt will be able to amb at least 1 mile per her personal goals s/p TKA Baseline:  Goal status: INITIAL  3.  Pt will be able to perform all transfers without pain Baseline:  Goal status: INITIAL  4.  Pt will have improved FOTO to >/=63 Baseline:  Goal status: INITIAL  5.  Pt will have 5x STS of </=13 sec to demo increased functional LE strength Baseline:  Goal status: INITIAL   PLAN:  PT FREQUENCY: 2x/week  PT DURATION: 8 weeks  PLANNED INTERVENTIONS: Therapeutic exercises, Therapeutic activity, Neuromuscular re-education, Balance training, Gait training, Patient/Family education, Self Care, Joint mobilization, Stair training, Dry Needling, Electrical stimulation, Spinal mobilization, Cryotherapy, Moist heat, Taping, Ionotophoresis 4mg /ml Dexamethasone, Manual therapy, and Re-evaluation.  PLAN FOR NEXT SESSION: Assess response to stretching HEP. Initiate core and midback strengthening. Manual work/TPDN for thoracic spine. Assess 5x STS   Carrisa Keller April Ma L Mylo, PT 03/20/2023, 8:51 AM

## 2023-03-25 ENCOUNTER — Encounter: Payer: Self-pay | Admitting: Physical Therapy

## 2023-03-25 ENCOUNTER — Ambulatory Visit: Payer: Medicare Other | Admitting: Physical Therapy

## 2023-03-25 DIAGNOSIS — R293 Abnormal posture: Secondary | ICD-10-CM | POA: Diagnosis not present

## 2023-03-25 DIAGNOSIS — R262 Difficulty in walking, not elsewhere classified: Secondary | ICD-10-CM | POA: Diagnosis not present

## 2023-03-25 DIAGNOSIS — M6281 Muscle weakness (generalized): Secondary | ICD-10-CM

## 2023-03-25 DIAGNOSIS — M546 Pain in thoracic spine: Secondary | ICD-10-CM

## 2023-03-25 DIAGNOSIS — M545 Low back pain, unspecified: Secondary | ICD-10-CM | POA: Diagnosis not present

## 2023-03-25 DIAGNOSIS — M62830 Muscle spasm of back: Secondary | ICD-10-CM | POA: Diagnosis not present

## 2023-03-25 NOTE — Therapy (Signed)
OUTPATIENT PHYSICAL THERAPY THORACOLUMBAR TREATMENT   Patient Name: Cindy [name] MRN: [id number] DOB:June 13, 1946, 77 y.o., female Today's Date: 03/25/2023  END OF SESSION:  PT End of Session - 03/25/23 1353     Visit Number 3    Number of Visits 16    Date for PT Re-Evaluation 05/13/23    Authorization Type Medicare    Progress Note Due on Visit 10    PT Start Time 1355    PT Stop Time 1440    PT Time Calculation (min) 45 min    Activity Tolerance Patient tolerated treatment well    Behavior During Therapy [facility] for tasks assessed/performed              Past Medical History:  Diagnosis Date   Abnormal mammogram    Allergic rhinitis 10/30/2018   Allergy    Tetracycoline. Sulfa   Anxiety in acute stress reaction 10/30/2018   Arthritis    Atypical chest pain 10/30/2018   Cancer [facility])    breast    Cataract    Both eyes surgery   Chest pain    "not cardiac related"   Colon polyps    Dyspnea    DOE   Family history of breast cancer 07/05/2020   Family history of pancreatic cancer 07/05/2020   Family history of prostate cancer 07/05/2020   Genetic testing 07/11/2020   GERD (gastroesophageal reflux disease) 10/30/2018   History of kidney stones    passed   Hypertension 10/30/2018   Hyperthyroidism    Hypothyroidism 10/30/2018   Migraine 10/30/2018   Osteoporosis 10/30/2018   Personal history of chemotherapy    Personal history of radiation therapy    Pneumonia    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   Vitamin D deficiency 10/30/2018   Past Surgical History:  Procedure Laterality Date   ABDOMINAL HYSTERECTOMY     BREAST BIOPSY Left 06/26/2020   x2   BREAST LUMPECTOMY Left 08/15/2020   BREAST LUMPECTOMY WITH RADIOACTIVE SEED AND SENTINEL LYMPH NODE BIOPSY Left 08/15/2020   Procedure: LEFT BREAST LUMPECTOMY WITH RADIOACTIVE SEED AND SENTINEL LYMPH NODE BIOPSY;  Surgeon: Almond Lint, MD;  Location: [facility] OR;  Service: General;  Laterality: Left;   RNFA   COLONOSCOPY W/ POLYPECTOMY     ESOPHAGEAL DILATION     EYE SURGERY Bilateral    catheter with lens   HYSTERECTOMY ABDOMINAL WITH SALPINGECTOMY     KNEE SURGERY Right 09/13/2022   PORT-A-CATH REMOVAL  01/04/2021   Procedure: REMOVAL PORT-A-CATH;  Surgeon: Almond Lint, MD;  Location: [facility];  Service: General;;   PORTACATH PLACEMENT Right 08/15/2020   Procedure: INSERTION PORT-A-CATH;  Surgeon: Almond Lint, MD;  Location: [facility] OR;  Service: General;  Laterality: Right;   TONSILLECTOMY     TUBAL LIGATION     Patient Active Problem List   Diagnosis Date Noted   Acute bilateral low back pain without sciatica 03/04/2023   Port-A-Cath in place 09/12/2020   Genetic testing 07/11/2020   Family history of breast cancer 07/05/2020   Family history of prostate cancer 07/05/2020   Malignant neoplasm of upper-outer quadrant of left breast in female, estrogen receptor negative (HCC) 06/30/2020   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/23/2019   Primary osteoarthritis of both hands 06/23/2019   Trigger thumb of left hand 06/23/2019   Polyarthralgia 06/23/2019   Hypertension 10/30/2018   Atypical chest pain 10/30/2018   Migraine 10/30/2018   Hypothyroidism 10/30/2018   Osteoporosis 10/30/2018   GERD (gastroesophageal reflux disease)  10/30/2018   Anxiety in acute stress reaction 10/30/2018   Allergic rhinitis 10/30/2018   Vitamin D deficiency 10/30/2018    PCP: Christen Butter, NP  REFERRING PROVIDER: Monica Becton,*  REFERRING DIAG: M54.50 (ICD-10-CM) - Acute bilateral low back pain without sciatica  Rationale for Evaluation and Treatment: Rehabilitation  THERAPY DIAG:  Abnormal posture  Pain in thoracic spine  Muscle spasm of back  Muscle weakness (generalized)  ONSET DATE: ~1 month  SUBJECTIVE:                                                                                                                                                                                            SUBJECTIVE STATEMENT: Pt states she is feeling better. Has been using her TENs. Pt states she wasn't too sore after needling.   PERTINENT HISTORY:  N/a; January 2024 R TKA  PAIN:  Are you having pain? Yes: NPRS scale: 1 "more fatigue", at worst 7/10 Pain location: Center lower midback Pain description: "almost like a spasm or catches" Aggravating factors: trying to get up (off bed primarily), sit<>stand, walking Relieving factors: heating pad/ice   PRECAUTIONS: None  WEIGHT BEARING RESTRICTIONS: No  FALLS:  Has patient fallen in last 6 months? No  LIVING ENVIRONMENT: Lives with: lives with their family Lives in: House/apartment Stairs: Yes: External: 3 steps; none Has following equipment at home: None  OCCUPATION: Paint and work in the yard  PLOF: Independent  PATIENT GOALS: Wants to return to painting and working in the yard and then get to walking  NEXT MD VISIT: n/a  OBJECTIVE:   DIAGNOSTIC FINDINGS:  03/03/23 Thoracic Spine x-ray IMPRESSION: 1. Moderate wedge-shaped compression deformity within the midthoracic spine, approximately T8, age indeterminate. 2. Severely exaggerated thoracic kyphosis.   03/03/23 Lumbar spine x-ray IMPRESSION:  1. Mild superior endplate compression deformity at L2, age  indeterminate.  2. Cortical irregularity at the anterosuperior corner of the L4  vertebral body, age indeterminate.   03/15/23 Lumbar MRI IMPRESSION: 1. No acute findings or clear explanation for the patient's symptoms. 2. Mild chronic superior endplate compression deformity at L2. No acute osseous findings. 3. Mild multilevel spondylosis as described. There is mild multifactorial spinal stenosis and lateral recess narrowing bilaterally at L3-4.    PATIENT SURVEYS:  FOTO 48; predicted 59   SENSATION: WFL except decreased sensation R lateral calf since knee replacement   MUSCLE LENGTH: Hamstrings: WNL Thomas test: Did not  assess  POSTURE: rounded shoulders, increased lumbar lordosis, and increased thoracic kyphosis  PALPATION: L>R thoracic paraspinal tightness and trigger points No overt tenderness with UPAs and CPAs  LUMBAR ROM:  AROM eval  Flexion 100%  Extension 80%  Right lateral flexion 80%  Left lateral flexion 100%  Right rotation 100%  Left rotation 100%   (Blank rows = not tested)  LOWER EXTREMITY ROM:     Active  Right eval Left eval  Hip flexion    Hip extension    Hip abduction    Hip adduction    Hip internal rotation    Hip external rotation    Knee flexion    Knee extension    Ankle dorsiflexion    Ankle plantarflexion    Ankle inversion    Ankle eversion     (Blank rows = not tested)  LOWER EXTREMITY MMT:    MMT Right eval Left eval  Hip flexion 3+ 4  Hip extension 3+ 4-  Hip abduction 3+ 3+  Hip adduction    Hip internal rotation    Hip external rotation    Knee flexion 4- 4+  Knee extension 4- 4+  Ankle dorsiflexion    Ankle plantarflexion    Ankle inversion    Ankle eversion     (Blank rows = not tested)   UPPER EXTREMITY MMT:  MMT Right eval Left eval  Shoulder flexion 5 5  Shoulder extension 4- 4-  Shoulder abduction 4+ 4+  Shoulder adduction    Shoulder internal rotation 5 5  Shoulder external rotation 3+ 4-  Middle trapezius 3 3  Lower trapezius    Elbow flexion    Elbow extension    Wrist flexion    Wrist extension    Wrist ulnar deviation    Wrist radial deviation    Wrist pronation    Wrist supination    Grip strength     (Blank rows = not tested)  LUMBAR SPECIAL TESTS:  Did not assess  FUNCTIONAL TESTS:  5 times sit to stand: TBA  GAIT: WNL  TODAY'S TREATMENT:    [facility] Adult PT Treatment:                                                DATE: 03/25/23 Therapeutic Exercise: Nustep L5 x 5 min Seated thoracic extension against coregeous ball x10 Seated thoracic rotation against coregeous ball x10 Seated open/book   Seated cervical retraction x10 Seated shoulder ER red TB 2x10 Seated "W" 2x10 Seated horizontal shoulder abd red TB 2x10 Seated diagonals yellow weighted ball x10 Seated "V" yellow weighted ball 2x5 Standing reactive iso row green TB x10 Standing reactive iso shoulder red green TB x10 Alternating step tap forward 6" step 2x10 Alternating step tap sideways 6" step 2x10   03/20/23 Nustep L5 x 5 min Prone Scap squeeze 10x3 sec "T" 10x3 sec Sidelying Open/close book 10x3 sec QL stretch leg off EOB with diaphragmatic breaths Supine PPT + diaphragmatic breathing 2x10 Low trunk rotation + diaphragmatic breathing x10 Seated thoracic extension x10 Manual therapy STM & TPR thoracolumbar paraspinal Skilled assessment and palpation for TPDN Trigger Point Dry-Needling  Treatment instructions: Expect mild to moderate muscle soreness. S/S of pneumothorax if dry needled over a lung field, and to seek immediate medical attention should they occur. Patient verbalized understanding of these instructions and education.  Patient Consent Given: Yes Education handout provided: Previously provided Muscles treated: thoracolumbar paraspinal, upper QL Electrical stimulation performed: No Parameters: N/A Treatment response/outcome: Decreased muscle tension  PATIENT EDUCATION:  Education details: Exam findings, POC, initial HEP Person educated: Patient Education method: Explanation, Demonstration, and Handouts Education comprehension: verbalized understanding, returned demonstration, and needs further education  HOME EXERCISE PROGRAM: Access Code: [email or web address] URL: [email or web address] Date: 03/18/2023 Prepared by: Vernon Prey April Kirstie Peri  Exercises - Sidelying Open Book Thoracic Lumbar Rotation and Extension  - 1 x daily - 7 x weekly - 1 sets - 10 reps - 3  sec hold - Supine Lower Trunk Rotation  - 1 x daily - 7 x weekly - 1 sets - 10 reps - 3 sec hold - Supine Piriformis Stretch with Foot on Ground  - 1 x daily - 7 x weekly - 1 sets - 30 sec hold - Supine Double Knee to Chest  - 1 x daily - 7 x weekly - 1 sets - 30 sec hold - Standing Paraspinals Mobilization with Small Ball on Wall  - 1 x daily - 7 x weekly - 1 sets - 30 sec hold  ASSESSMENT:  CLINICAL IMPRESSION: Pt presents with less pain today. Able to work on progressing midback and core strengthening for improved trunk stability and decrease spasm. Tolerated well but did require rest breaks -- had a small bout of dizziness that resolved. BP 115/70 (pt states this is close to her normal).    GOALS: Goals reviewed with patient? Yes  SHORT TERM GOALS: Target date: 04/15/2023  Pt will be ind with initial HEP Baseline: Goal status: INITIAL  2.  Pt will report at least 50% improvement in back pain Baseline:  Goal status: INITIAL  3.  Pt will be able to transfer in/out of bed without pain Baseline:  Goal status: INITIAL   LONG TERM GOALS: Target date: 05/13/2023   Pt will be ind with management and progression of HEP Baseline:  Goal status: INITIAL  2.  Pt will be able to amb at least 1 mile per her personal goals s/p TKA Baseline:  Goal status: INITIAL  3.  Pt will be able to perform all transfers without pain Baseline:  Goal status: INITIAL  4.  Pt will have improved FOTO to >/=63 Baseline:  Goal status: INITIAL  5.  Pt will have 5x STS of </=13 sec to demo increased functional LE strength Baseline:  Goal status: INITIAL   PLAN:  PT FREQUENCY: 2x/week  PT DURATION: 8 weeks  PLANNED INTERVENTIONS: Therapeutic exercises, Therapeutic activity, Neuromuscular re-education, Balance training, Gait training, Patient/Family education, Self Care, Joint mobilization, Stair training, Dry Needling, Electrical stimulation, Spinal mobilization, Cryotherapy, Moist heat, Taping,  Ionotophoresis 4mg /ml Dexamethasone, Manual therapy, and Re-evaluation.  PLAN FOR NEXT SESSION: Assess response to stretching HEP. Initiate core and midback strengthening. Manual work/TPDN for thoracic spine. Assess 5x STS   Mirra Basilio April Ma L Pierce, PT 03/25/2023, 1:54 PM

## 2023-03-27 ENCOUNTER — Ambulatory Visit: Payer: Medicare Other

## 2023-04-01 ENCOUNTER — Ambulatory Visit: Payer: Medicare Other | Admitting: Physical Therapy

## 2023-04-01 ENCOUNTER — Encounter: Payer: Self-pay | Admitting: Physical Therapy

## 2023-04-01 DIAGNOSIS — M546 Pain in thoracic spine: Secondary | ICD-10-CM | POA: Diagnosis not present

## 2023-04-01 DIAGNOSIS — M62830 Muscle spasm of back: Secondary | ICD-10-CM

## 2023-04-01 DIAGNOSIS — R262 Difficulty in walking, not elsewhere classified: Secondary | ICD-10-CM | POA: Diagnosis not present

## 2023-04-01 DIAGNOSIS — M6281 Muscle weakness (generalized): Secondary | ICD-10-CM | POA: Diagnosis not present

## 2023-04-01 DIAGNOSIS — R293 Abnormal posture: Secondary | ICD-10-CM | POA: Diagnosis not present

## 2023-04-01 DIAGNOSIS — M545 Low back pain, unspecified: Secondary | ICD-10-CM | POA: Diagnosis not present

## 2023-04-01 NOTE — Therapy (Signed)
OUTPATIENT PHYSICAL THERAPY THORACOLUMBAR TREATMENT   Patient Name: Cindy [name] MRN: [id number] DOB:October 16, 1945, 77 y.o., female Today's Date: 04/01/2023  END OF SESSION:  PT End of Session - 04/01/23 1318     Visit Number 4    Number of Visits 16    Date for PT Re-Evaluation 05/13/23    Authorization Type Medicare    Progress Note Due on Visit 10    PT Start Time 1318    PT Stop Time 1400    PT Time Calculation (min) 42 min    Activity Tolerance Patient tolerated treatment well    Behavior During Therapy [facility] for tasks assessed/performed              Past Medical History:  Diagnosis Date   Abnormal mammogram    Allergic rhinitis 10/30/2018   Allergy    Tetracycoline. Sulfa   Anxiety in acute stress reaction 10/30/2018   Arthritis    Atypical chest pain 10/30/2018   Cancer [facility])    breast    Cataract    Both eyes surgery   Chest pain    "not cardiac related"   Colon polyps    Dyspnea    DOE   Family history of breast cancer 07/05/2020   Family history of pancreatic cancer 07/05/2020   Family history of prostate cancer 07/05/2020   Genetic testing 07/11/2020   GERD (gastroesophageal reflux disease) 10/30/2018   History of kidney stones    passed   Hypertension 10/30/2018   Hyperthyroidism    Hypothyroidism 10/30/2018   Migraine 10/30/2018   Osteoporosis 10/30/2018   Personal history of chemotherapy    Personal history of radiation therapy    Pneumonia    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   Vitamin D deficiency 10/30/2018   Past Surgical History:  Procedure Laterality Date   ABDOMINAL HYSTERECTOMY     BREAST BIOPSY Left 06/26/2020   x2   BREAST LUMPECTOMY Left 08/15/2020   BREAST LUMPECTOMY WITH RADIOACTIVE SEED AND SENTINEL LYMPH NODE BIOPSY Left 08/15/2020   Procedure: LEFT BREAST LUMPECTOMY WITH RADIOACTIVE SEED AND SENTINEL LYMPH NODE BIOPSY;  Surgeon: Almond Lint, MD;  Location: [facility] OR;  Service: General;  Laterality: Left;   RNFA   COLONOSCOPY W/ POLYPECTOMY     ESOPHAGEAL DILATION     EYE SURGERY Bilateral    catheter with lens   HYSTERECTOMY ABDOMINAL WITH SALPINGECTOMY     KNEE SURGERY Right 09/13/2022   PORT-A-CATH REMOVAL  01/04/2021   Procedure: REMOVAL PORT-A-CATH;  Surgeon: Almond Lint, MD;  Location: [facility];  Service: General;;   PORTACATH PLACEMENT Right 08/15/2020   Procedure: INSERTION PORT-A-CATH;  Surgeon: Almond Lint, MD;  Location: [facility] OR;  Service: General;  Laterality: Right;   TONSILLECTOMY     TUBAL LIGATION     Patient Active Problem List   Diagnosis Date Noted   Acute bilateral low back pain without sciatica 03/04/2023   Port-A-Cath in place 09/12/2020   Genetic testing 07/11/2020   Family history of breast cancer 07/05/2020   Family history of prostate cancer 07/05/2020   Malignant neoplasm of upper-outer quadrant of left breast in female, estrogen receptor negative (HCC) 06/30/2020   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/23/2019   Primary osteoarthritis of both hands 06/23/2019   Trigger thumb of left hand 06/23/2019   Polyarthralgia 06/23/2019   Hypertension 10/30/2018   Atypical chest pain 10/30/2018   Migraine 10/30/2018   Hypothyroidism 10/30/2018   Osteoporosis 10/30/2018   GERD (gastroesophageal reflux disease)  10/30/2018   Anxiety in acute stress reaction 10/30/2018   Allergic rhinitis 10/30/2018   Vitamin D deficiency 10/30/2018    PCP: Christen Butter, NP  REFERRING PROVIDER: Monica Becton,*  REFERRING DIAG: M54.50 (ICD-10-CM) - Acute bilateral low back pain without sciatica  Rationale for Evaluation and Treatment: Rehabilitation  THERAPY DIAG:  No diagnosis found.  ONSET DATE: ~1 month  SUBJECTIVE:                                                                                                                                                                                           SUBJECTIVE STATEMENT: Pt reports she is  back to doing most of her normal activities but feels fatigue. Pt states she didn't feel very good the next day after last treatment session. Felt it primarily in her upper traps. Midback pain remains maintained.   PERTINENT HISTORY:  N/a; January 2024 R TKA  PAIN:  Are you having pain? Yes: NPRS scale: 0 "more fatigue", at worst 7/10 Pain location: Center lower midback Pain description: "almost like a spasm or catches" Aggravating factors: trying to get up (off bed primarily), sit<>stand, walking Relieving factors: heating pad/ice   PRECAUTIONS: None  WEIGHT BEARING RESTRICTIONS: No  FALLS:  Has patient fallen in last 6 months? No  LIVING ENVIRONMENT: Lives with: lives with their family Lives in: House/apartment Stairs: Yes: External: 3 steps; none Has following equipment at home: None  OCCUPATION: Paint and work in the yard  PLOF: Independent  PATIENT GOALS: Wants to return to painting and working in the yard and then get to walking  NEXT MD VISIT: n/a  OBJECTIVE:   DIAGNOSTIC FINDINGS:  03/03/23 Thoracic Spine x-ray IMPRESSION: 1. Moderate wedge-shaped compression deformity within the midthoracic spine, approximately T8, age indeterminate. 2. Severely exaggerated thoracic kyphosis.   03/03/23 Lumbar spine x-ray IMPRESSION:  1. Mild superior endplate compression deformity at L2, age  indeterminate.  2. Cortical irregularity at the anterosuperior corner of the L4  vertebral body, age indeterminate.   03/15/23 Lumbar MRI IMPRESSION: 1. No acute findings or clear explanation for the patient's symptoms. 2. Mild chronic superior endplate compression deformity at L2. No acute osseous findings. 3. Mild multilevel spondylosis as described. There is mild multifactorial spinal stenosis and lateral recess narrowing bilaterally at L3-4.    PATIENT SURVEYS:  FOTO 48; predicted 56   SENSATION: WFL except decreased sensation R lateral calf since knee replacement   MUSCLE  LENGTH: Hamstrings: WNL Thomas test: Did not assess  POSTURE: rounded shoulders, increased lumbar lordosis, and increased thoracic kyphosis  PALPATION: L>R thoracic paraspinal tightness and trigger points No overt tenderness  with UPAs and CPAs  LUMBAR ROM:   AROM eval  Flexion 100%  Extension 80%  Right lateral flexion 80%  Left lateral flexion 100%  Right rotation 100%  Left rotation 100%   (Blank rows = not tested)  LOWER EXTREMITY ROM:     Active  Right eval Left eval  Hip flexion    Hip extension    Hip abduction    Hip adduction    Hip internal rotation    Hip external rotation    Knee flexion    Knee extension    Ankle dorsiflexion    Ankle plantarflexion    Ankle inversion    Ankle eversion     (Blank rows = not tested)  LOWER EXTREMITY MMT:    MMT Right eval Left eval  Hip flexion 3+ 4  Hip extension 3+ 4-  Hip abduction 3+ 3+  Hip adduction    Hip internal rotation    Hip external rotation    Knee flexion 4- 4+  Knee extension 4- 4+  Ankle dorsiflexion    Ankle plantarflexion    Ankle inversion    Ankle eversion     (Blank rows = not tested)   UPPER EXTREMITY MMT:  MMT Right eval Left eval  Shoulder flexion 5 5  Shoulder extension 4- 4-  Shoulder abduction 4+ 4+  Shoulder adduction    Shoulder internal rotation 5 5  Shoulder external rotation 3+ 4-  Middle trapezius 3 3  Lower trapezius    Elbow flexion    Elbow extension    Wrist flexion    Wrist extension    Wrist ulnar deviation    Wrist radial deviation    Wrist pronation    Wrist supination    Grip strength     (Blank rows = not tested)  LUMBAR SPECIAL TESTS:  Did not assess  FUNCTIONAL TESTS:  5 times sit to stand: TBA  GAIT: WNL  TODAY'S TREATMENT:    [facility] Adult PT Treatment:                                                DATE: 04/01/23 Therapeutic Exercise: Seated thoracic extension against coregeous ball x10 Seated upper trap stretch x30 sec R&L Seated  open/book  Seated horizontal abd red TB x10 Seated "W" red TB 2x10 Seated shoulder ER red TB 2x10 Standing shoulder ext red TB 2x10 focusing on decreasing UT activity Standing row red TB reactive iso 2x10 Standing palloff press red TB x10 Manual Therapy: STM & TPR bilat UT Skilled assessment and palpation for TPDN Trigger Point Dry-Needling  Treatment instructions: Expect mild to moderate muscle soreness. S/S of pneumothorax if dry needled over a lung field, and to seek immediate medical attention should they occur. Patient verbalized understanding of these instructions and education.  Patient Consent Given: Yes Education handout provided: Previously provided Muscles treated: bilat UT Electrical stimulation performed: No Parameters: N/A Treatment response/outcome: Twitch response, decreased muscle tension Modalities TENS to bilat UT per pt tolerance x 15 min   [facility] Adult PT Treatment:                                                DATE: 03/25/23 Therapeutic  Exercise: Nustep L5 x 5 min Seated thoracic extension against coregeous ball x10 Seated thoracic rotation against coregeous ball x10 Seated open/book  Seated cervical retraction x10 Seated shoulder ER red TB 2x10 Seated "W" 2x10 Seated horizontal shoulder abd red TB 2x10 Seated diagonals yellow weighted ball x10 Seated "V" yellow weighted ball 2x5 Standing reactive iso row green TB x10 Standing reactive iso shoulder ext red TB x10 Alternating step tap forward 6" step 2x10 Alternating step tap sideways 6" step 2x10   03/20/23 Nustep L5 x 5 min Prone Scap squeeze 10x3 sec "T" 10x3 sec Sidelying Open/close book 10x3 sec QL stretch leg off EOB with diaphragmatic breaths Supine PPT + diaphragmatic breathing 2x10 Low trunk rotation + diaphragmatic breathing x10 Seated thoracic extension x10 Manual therapy STM & TPR thoracolumbar paraspinal Skilled assessment and palpation for TPDN Trigger Point Dry-Needling   Treatment instructions: Expect mild to moderate muscle soreness. S/S of pneumothorax if dry needled over a lung field, and to seek immediate medical attention should they occur. Patient verbalized understanding of these instructions and education.  Patient Consent Given: Yes Education handout provided: Previously provided Muscles treated: thoracolumbar paraspinal, upper QL Electrical stimulation performed: No Parameters: N/A Treatment response/outcome: Decreased muscle tension                                                                                                                             PATIENT EDUCATION:  Education details: Exam findings, POC, initial HEP Person educated: Patient Education method: Explanation, Demonstration, and Handouts Education comprehension: verbalized understanding, returned demonstration, and needs further education  HOME EXERCISE PROGRAM: Access Code: [email or web address] URL: [email or web address] Date: 03/18/2023 Prepared by: Vernon Prey April Kirstie Peri  Exercises - Sidelying Open Book Thoracic Lumbar Rotation and Extension  - 1 x daily - 7 x weekly - 1 sets - 10 reps - 3 sec hold - Supine Lower Trunk Rotation  - 1 x daily - 7 x weekly - 1 sets - 10 reps - 3 sec hold - Supine Piriformis Stretch with Foot on Ground  - 1 x daily - 7 x weekly - 1 sets - 30 sec hold - Supine Double Knee to Chest  - 1 x daily - 7 x weekly - 1 sets - 30 sec hold - Standing Paraspinals Mobilization with Small Ball on Wall  - 1 x daily - 7 x weekly - 1 sets - 30 sec hold  ASSESSMENT:  CLINICAL IMPRESSION: Pt reports pain continues to improve. Notes some increased fatigue/decreased endurance with return to her normal activities. Session focused on continuing postural stability and core strengthening. Pt has difficulty performing shoulder movements without UT compensation. Perform TPDN to decrease UT tension.    GOALS: Goals reviewed with patient? Yes  SHORT TERM  GOALS: Target date: 04/15/2023  Pt will be ind with initial HEP Baseline: Goal status: INITIAL  2.  Pt will report at least 50% improvement in back pain Baseline:  Goal status: INITIAL  3.  Pt will be able to transfer in/out of bed without pain Baseline:  Goal status: INITIAL   LONG TERM GOALS: Target date: 05/13/2023   Pt will be ind with management and progression of HEP Baseline:  Goal status: INITIAL  2.  Pt will be able to amb at least 1 mile per her personal goals s/p TKA Baseline:  Goal status: INITIAL  3.  Pt will be able to perform all transfers without pain Baseline:  Goal status: INITIAL  4.  Pt will have improved FOTO to >/=63 Baseline:  Goal status: INITIAL  5.  Pt will have 5x STS of </=13 sec to demo increased functional LE strength Baseline:  Goal status: INITIAL   PLAN:  PT FREQUENCY: 2x/week  PT DURATION: 8 weeks  PLANNED INTERVENTIONS: Therapeutic exercises, Therapeutic activity, Neuromuscular re-education, Balance training, Gait training, Patient/Family education, Self Care, Joint mobilization, Stair training, Dry Needling, Electrical stimulation, Spinal mobilization, Cryotherapy, Moist heat, Taping, Ionotophoresis 4mg /ml Dexamethasone, Manual therapy, and Re-evaluation.  PLAN FOR NEXT SESSION: Assess response to stretching HEP. Initiate core and midback strengthening. Manual work/TPDN for thoracic spine. Assess 5x STS   Bay Jarquin April Ma L Bunkie, PT 04/01/2023, 1:18 PM

## 2023-04-03 ENCOUNTER — Ambulatory Visit: Payer: Medicare Other | Admitting: Physical Therapy

## 2023-04-03 ENCOUNTER — Encounter: Payer: Self-pay | Admitting: Physical Therapy

## 2023-04-03 DIAGNOSIS — M62830 Muscle spasm of back: Secondary | ICD-10-CM | POA: Diagnosis not present

## 2023-04-03 DIAGNOSIS — M546 Pain in thoracic spine: Secondary | ICD-10-CM | POA: Diagnosis not present

## 2023-04-03 DIAGNOSIS — R262 Difficulty in walking, not elsewhere classified: Secondary | ICD-10-CM | POA: Diagnosis not present

## 2023-04-03 DIAGNOSIS — R293 Abnormal posture: Secondary | ICD-10-CM | POA: Diagnosis not present

## 2023-04-03 DIAGNOSIS — M6281 Muscle weakness (generalized): Secondary | ICD-10-CM | POA: Diagnosis not present

## 2023-04-03 DIAGNOSIS — M545 Low back pain, unspecified: Secondary | ICD-10-CM | POA: Diagnosis not present

## 2023-04-03 NOTE — Therapy (Signed)
OUTPATIENT PHYSICAL THERAPY THORACOLUMBAR TREATMENT   Patient Name: Cindy [name] MRN: [id number] DOB:07/08/1946, 77 y.o., female Today's Date: 04/03/2023  END OF SESSION:  PT End of Session - 04/03/23 0936     Visit Number 5    Number of Visits 16    Date for PT Re-Evaluation 05/13/23    Authorization Type Medicare    Progress Note Due on Visit 10    PT Start Time 0935    PT Stop Time 1015    PT Time Calculation (min) 40 min    Activity Tolerance Patient tolerated treatment well    Behavior During Therapy [facility] for tasks assessed/performed              Past Medical History:  Diagnosis Date   Abnormal mammogram    Allergic rhinitis 10/30/2018   Allergy    Tetracycoline. Sulfa   Anxiety in acute stress reaction 10/30/2018   Arthritis    Atypical chest pain 10/30/2018   Cancer [facility])    breast    Cataract    Both eyes surgery   Chest pain    "not cardiac related"   Colon polyps    Dyspnea    DOE   Family history of breast cancer 07/05/2020   Family history of pancreatic cancer 07/05/2020   Family history of prostate cancer 07/05/2020   Genetic testing 07/11/2020   GERD (gastroesophageal reflux disease) 10/30/2018   History of kidney stones    passed   Hypertension 10/30/2018   Hyperthyroidism    Hypothyroidism 10/30/2018   Migraine 10/30/2018   Osteoporosis 10/30/2018   Personal history of chemotherapy    Personal history of radiation therapy    Pneumonia    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   Vitamin D deficiency 10/30/2018   Past Surgical History:  Procedure Laterality Date   ABDOMINAL HYSTERECTOMY     BREAST BIOPSY Left 06/26/2020   x2   BREAST LUMPECTOMY Left 08/15/2020   BREAST LUMPECTOMY WITH RADIOACTIVE SEED AND SENTINEL LYMPH NODE BIOPSY Left 08/15/2020   Procedure: LEFT BREAST LUMPECTOMY WITH RADIOACTIVE SEED AND SENTINEL LYMPH NODE BIOPSY;  Surgeon: Almond Lint, MD;  Location: [facility] OR;  Service: General;  Laterality: Left;   RNFA   COLONOSCOPY W/ POLYPECTOMY     ESOPHAGEAL DILATION     EYE SURGERY Bilateral    catheter with lens   HYSTERECTOMY ABDOMINAL WITH SALPINGECTOMY     KNEE SURGERY Right 09/13/2022   PORT-A-CATH REMOVAL  01/04/2021   Procedure: REMOVAL PORT-A-CATH;  Surgeon: Almond Lint, MD;  Location: [facility] [place];  Service: General;;   PORTACATH PLACEMENT Right 08/15/2020   Procedure: INSERTION PORT-A-CATH;  Surgeon: Almond Lint, MD;  Location: [facility] OR;  Service: General;  Laterality: Right;   TONSILLECTOMY     TUBAL LIGATION     Patient Active Problem List   Diagnosis Date Noted   Acute bilateral low back pain without sciatica 03/04/2023   Port-A-Cath in place 09/12/2020   Genetic testing 07/11/2020   Family history of breast cancer 07/05/2020   Family history of prostate cancer 07/05/2020   Malignant neoplasm of upper-outer quadrant of left breast in female, estrogen receptor negative (HCC) 06/30/2020   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/23/2019   Primary osteoarthritis of both hands 06/23/2019   Trigger thumb of left hand 06/23/2019   Polyarthralgia 06/23/2019   Hypertension 10/30/2018   Atypical chest pain 10/30/2018   Migraine 10/30/2018   Hypothyroidism 10/30/2018   Osteoporosis 10/30/2018   GERD (gastroesophageal reflux disease)  10/30/2018   Anxiety in acute stress reaction 10/30/2018   Allergic rhinitis 10/30/2018   Vitamin D deficiency 10/30/2018    PCP: Christen Butter, NP  REFERRING PROVIDER: Monica Becton,*  REFERRING DIAG: M54.50 (ICD-10-CM) - Acute bilateral low back pain without sciatica  Rationale for Evaluation and Treatment: Rehabilitation  THERAPY DIAG:  Abnormal posture  Muscle spasm of back  Pain in thoracic spine  Muscle weakness (generalized)  Difficulty in walking, not elsewhere classified  ONSET DATE: ~1 month  SUBJECTIVE:                                                                                                                                                                                            SUBJECTIVE STATEMENT: Pt states she wasn't too sore after last session. Has continued fatigue but does note her overall activity level had decreased with the R TKA. States her shoulders have been feeling better too since last treatment.   PERTINENT HISTORY:  January 2024 R TKA  PAIN:  Are you having pain? Yes: NPRS scale: 0 "more fatigue", at worst 7/10 Pain location: Center lower midback Pain description: "almost like a spasm or catches" Aggravating factors: trying to get up (off bed primarily), sit<>stand, walking Relieving factors: heating pad/ice   PRECAUTIONS: None  WEIGHT BEARING RESTRICTIONS: No  FALLS:  Has patient fallen in last 6 months? No  LIVING ENVIRONMENT: Lives with: lives with their family Lives in: House/apartment Stairs: Yes: External: 3 steps; none Has following equipment at home: None  OCCUPATION: Paint and work in the yard  PLOF: Independent  PATIENT GOALS: Wants to return to painting and working in the yard and then get to walking  NEXT MD VISIT: n/a  OBJECTIVE:   DIAGNOSTIC FINDINGS:  03/03/23 Thoracic Spine x-ray IMPRESSION: 1. Moderate wedge-shaped compression deformity within the midthoracic spine, approximately T8, age indeterminate. 2. Severely exaggerated thoracic kyphosis.   03/03/23 Lumbar spine x-ray IMPRESSION:  1. Mild superior endplate compression deformity at L2, age  indeterminate.  2. Cortical irregularity at the anterosuperior corner of the L4  vertebral body, age indeterminate.   03/15/23 Lumbar MRI IMPRESSION: 1. No acute findings or clear explanation for the patient's symptoms. 2. Mild chronic superior endplate compression deformity at L2. No acute osseous findings. 3. Mild multilevel spondylosis as described. There is mild multifactorial spinal stenosis and lateral recess narrowing bilaterally at L3-4.    PATIENT SURVEYS:  FOTO 48;  predicted 53   SENSATION: WFL except decreased sensation R lateral calf since knee replacement   MUSCLE LENGTH: Hamstrings: WNL Thomas test: Did not assess  POSTURE: rounded shoulders, increased lumbar lordosis, and  increased thoracic kyphosis  PALPATION: L>R thoracic paraspinal tightness and trigger points No overt tenderness with UPAs and CPAs  LUMBAR ROM:   AROM eval  Flexion 100%  Extension 80%  Right lateral flexion 80%  Left lateral flexion 100%  Right rotation 100%  Left rotation 100%   (Blank rows = not tested)  LOWER EXTREMITY ROM:     Active  Right eval Left eval  Hip flexion    Hip extension    Hip abduction    Hip adduction    Hip internal rotation    Hip external rotation    Knee flexion    Knee extension    Ankle dorsiflexion    Ankle plantarflexion    Ankle inversion    Ankle eversion     (Blank rows = not tested)  LOWER EXTREMITY MMT:    MMT Right eval Left eval  Hip flexion 3+ 4  Hip extension 3+ 4-  Hip abduction 3+ 3+  Hip adduction    Hip internal rotation    Hip external rotation    Knee flexion 4- 4+  Knee extension 4- 4+  Ankle dorsiflexion    Ankle plantarflexion    Ankle inversion    Ankle eversion     (Blank rows = not tested)   UPPER EXTREMITY MMT:  MMT Right eval Left eval  Shoulder flexion 5 5  Shoulder extension 4- 4-  Shoulder abduction 4+ 4+  Shoulder adduction    Shoulder internal rotation 5 5  Shoulder external rotation 3+ 4-  Middle trapezius 3 3  Lower trapezius    Elbow flexion    Elbow extension    Wrist flexion    Wrist extension    Wrist ulnar deviation    Wrist radial deviation    Wrist pronation    Wrist supination    Grip strength     (Blank rows = not tested)  LUMBAR SPECIAL TESTS:  Did not assess  FUNCTIONAL TESTS:  5 times sit to stand: TBA  GAIT: WNL  TODAY'S TREATMENT:    [facility] Adult PT Treatment:                                                DATE: 04/03/23 Therapeutic  Exercise: Nustep L5; 2 x 5 min working on aerobic capacity/endurance Seated horizontal abd red TB x10 Seated "W" green TB 2x10 Seated shoulder ER green TB 2x10 Standing against pool noodle Shoulder flexion 1# 2x10 Shoulders elevated with 1# and alternating marching x10 Shoulder abd 1# 2x10 Standing row green TB reactive iso 2x10 Standing shoulder ext green TB reactive iso 2x10    [facility] Adult PT Treatment:                                                DATE: 04/01/23 Therapeutic Exercise: Seated thoracic extension against coregeous ball x10 Seated upper trap stretch x30 sec R&L Seated open/book  Seated horizontal abd red TB x10 Seated "W" red TB 2x10 Seated shoulder ER red TB 2x10 Standing shoulder ext red TB 2x10 focusing on decreasing UT activity Standing row red TB reactive iso 2x10 Standing palloff press red TB x10 Manual Therapy: STM & TPR bilat UT Skilled assessment and palpation  for TPDN Trigger Point Dry-Needling  Treatment instructions: Expect mild to moderate muscle soreness. S/S of pneumothorax if dry needled over a lung field, and to seek immediate medical attention should they occur. Patient verbalized understanding of these instructions and education.  Patient Consent Given: Yes Education handout provided: Previously provided Muscles treated: bilat UT Electrical stimulation performed: No Parameters: N/A Treatment response/outcome: Twitch response, decreased muscle tension Modalities TENS to bilat UT per pt tolerance x 15 min   [facility] Adult PT Treatment:                                                DATE: 03/25/23 Therapeutic Exercise: Nustep L5 x 5 min Seated thoracic extension against coregeous ball x10 Seated thoracic rotation against coregeous ball x10 Seated open/book  Seated cervical retraction x10 Seated shoulder ER red TB 2x10 Seated "W" 2x10 Seated horizontal shoulder abd red TB 2x10 Seated diagonals yellow weighted ball x10 Seated "V" yellow  weighted ball 2x5 Standing reactive iso row green TB x10 Standing reactive iso shoulder ext red TB x10 Alternating step tap forward 6" step 2x10 Alternating step tap sideways 6" step 2x10   03/20/23 Nustep L5 x 5 min Prone Scap squeeze 10x3 sec "T" 10x3 sec Sidelying Open/close book 10x3 sec QL stretch leg off EOB with diaphragmatic breaths Supine PPT + diaphragmatic breathing 2x10 Low trunk rotation + diaphragmatic breathing x10 Seated thoracic extension x10 Manual therapy STM & TPR thoracolumbar paraspinal Skilled assessment and palpation for TPDN Trigger Point Dry-Needling  Treatment instructions: Expect mild to moderate muscle soreness. S/S of pneumothorax if dry needled over a lung field, and to seek immediate medical attention should they occur. Patient verbalized understanding of these instructions and education.  Patient Consent Given: Yes Education handout provided: Previously provided Muscles treated: thoracolumbar paraspinal, upper QL Electrical stimulation performed: No Parameters: N/A Treatment response/outcome: Decreased muscle tension                                                                                                                             PATIENT EDUCATION:  Education details: Exam findings, POC, initial HEP Person educated: Patient Education method: Explanation, Demonstration, and Handouts Education comprehension: verbalized understanding, returned demonstration, and needs further education  HOME EXERCISE PROGRAM: Access Code: [email or web address] URL: [email or web address] Date: 03/18/2023 Prepared by: Vernon Prey April Kirstie Peri  Exercises - Sidelying Open Book Thoracic Lumbar Rotation and Extension  - 1 x daily - 7 x weekly - 1 sets - 10 reps - 3 sec hold - Supine Lower Trunk Rotation  - 1 x daily - 7 x weekly - 1 sets - 10 reps - 3 sec hold - Supine Piriformis Stretch with Foot on Ground  - 1 x daily - 7 x weekly - 1 sets - 30 sec  hold - Supine Double Knee to Chest  -  1 x daily - 7 x weekly - 1 sets - 30 sec hold - Standing Paraspinals Mobilization with Small Ball on Wall  - 1 x daily - 7 x weekly - 1 sets - 30 sec hold  ASSESSMENT:  CLINICAL IMPRESSION: Session focused primarily on improving pt's midback strength and overall endurance/aerobic capacity. Gets SHOB on the Nustep -- had to take 1 break at 5 minutes. Improving scapular stability. Worked on shoulder strengthening without UT activity.  GOALS: Goals reviewed with patient? Yes  SHORT TERM GOALS: Target date: 04/15/2023  Pt will be ind with initial HEP Baseline: Goal status: INITIAL  2.  Pt will report at least 50% improvement in back pain Baseline:  Goal status: INITIAL  3.  Pt will be able to transfer in/out of bed without pain Baseline:  Goal status: INITIAL   LONG TERM GOALS: Target date: 05/13/2023   Pt will be ind with management and progression of HEP Baseline:  Goal status: INITIAL  2.  Pt will be able to amb at least 1 mile per her personal goals s/p TKA Baseline:  Goal status: INITIAL  3.  Pt will be able to perform all transfers without pain Baseline:  Goal status: INITIAL  4.  Pt will have improved FOTO to >/=63 Baseline:  Goal status: INITIAL  5.  Pt will have 5x STS of </=13 sec to demo increased functional LE strength Baseline:  Goal status: INITIAL   PLAN:  PT FREQUENCY: 2x/week  PT DURATION: 8 weeks  PLANNED INTERVENTIONS: Therapeutic exercises, Therapeutic activity, Neuromuscular re-education, Balance training, Gait training, Patient/Family education, Self Care, Joint mobilization, Stair training, Dry Needling, Electrical stimulation, Spinal mobilization, Cryotherapy, Moist heat, Taping, Ionotophoresis 4mg /ml Dexamethasone, Manual therapy, and Re-evaluation.  PLAN FOR NEXT SESSION: Assess response to stretching HEP. Initiate core and midback strengthening. Manual work/TPDN for thoracic spine. Assess 5x  STS   Marsh Heckler April Ma L Hazen Brumett, PT 04/03/2023, 9:36 AM

## 2023-04-08 ENCOUNTER — Encounter: Payer: Self-pay | Admitting: Physical Therapy

## 2023-04-08 ENCOUNTER — Ambulatory Visit: Payer: Medicare Other | Admitting: Physical Therapy

## 2023-04-08 DIAGNOSIS — R262 Difficulty in walking, not elsewhere classified: Secondary | ICD-10-CM

## 2023-04-08 DIAGNOSIS — M545 Low back pain, unspecified: Secondary | ICD-10-CM | POA: Diagnosis not present

## 2023-04-08 DIAGNOSIS — M6281 Muscle weakness (generalized): Secondary | ICD-10-CM

## 2023-04-08 DIAGNOSIS — R293 Abnormal posture: Secondary | ICD-10-CM | POA: Diagnosis not present

## 2023-04-08 DIAGNOSIS — M546 Pain in thoracic spine: Secondary | ICD-10-CM | POA: Diagnosis not present

## 2023-04-08 DIAGNOSIS — M62830 Muscle spasm of back: Secondary | ICD-10-CM | POA: Diagnosis not present

## 2023-04-08 NOTE — Therapy (Signed)
OUTPATIENT PHYSICAL THERAPY THORACOLUMBAR TREATMENT   Patient Name: Tashyra Cipollone MRN: [id number] DOB:11-09-1945, 77 y.o., female Today's Date: 04/08/2023  END OF SESSION:  PT End of Session - 04/08/23 1021     Visit Number 6    Number of Visits 16    Date for PT Re-Evaluation 05/13/23    Authorization Type Medicare    Progress Note Due on Visit 10    PT Start Time 1017    PT Stop Time 1100    PT Time Calculation (min) 43 min    Activity Tolerance Patient tolerated treatment well    Behavior During Therapy [facility] for tasks assessed/performed              Past Medical History:  Diagnosis Date   Abnormal mammogram    Allergic rhinitis 10/30/2018   Allergy    Tetracycoline. Sulfa   Anxiety in acute stress reaction 10/30/2018   Arthritis    Atypical chest pain 10/30/2018   [facility])    breast    Cataract    Both eyes surgery   Chest pain    "not cardiac related"   Colon polyps    Dyspnea    DOE   Family history of breast cancer 07/05/2020   Family history of pancreatic cancer 07/05/2020   Family history of prostate cancer 07/05/2020   Genetic testing 07/11/2020   GERD (gastroesophageal reflux disease) 10/30/2018   History of kidney stones    passed   Hypertension 10/30/2018   Hyperthyroidism    Hypothyroidism 10/30/2018   Migraine 10/30/2018   Osteoporosis 10/30/2018   Personal history of chemotherapy    Personal history of radiation therapy    Pneumonia    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   Vitamin D deficiency 10/30/2018   Past Surgical History:  Procedure Laterality Date   ABDOMINAL HYSTERECTOMY     BREAST BIOPSY Left 06/26/2020   x2   BREAST LUMPECTOMY Left 08/15/2020   BREAST LUMPECTOMY WITH RADIOACTIVE SEED AND SENTINEL LYMPH NODE BIOPSY Left 08/15/2020   Procedure: LEFT BREAST LUMPECTOMY WITH RADIOACTIVE SEED AND SENTINEL LYMPH NODE BIOPSY;  Surgeon: Almond Lint, MD;  Location: [facility] OR;  Service: General;  Laterality: Left;   RNFA   COLONOSCOPY W/ POLYPECTOMY     ESOPHAGEAL DILATION     EYE SURGERY Bilateral    catheter with lens   HYSTERECTOMY ABDOMINAL WITH SALPINGECTOMY     KNEE SURGERY Right 09/13/2022   PORT-A-CATH REMOVAL  01/04/2021   Procedure: REMOVAL PORT-A-CATH;  Surgeon: Almond Lint, MD;  Location: [facility];  Service: General;;   PORTACATH PLACEMENT Right 08/15/2020   Procedure: INSERTION PORT-A-CATH;  Surgeon: Almond Lint, MD;  Location: [facility] OR;  Service: General;  Laterality: Right;   TONSILLECTOMY     TUBAL LIGATION     Patient Active Problem List   Diagnosis Date Noted   Acute bilateral low back pain without sciatica 03/04/2023   Port-A-Cath in place 09/12/2020   Genetic testing 07/11/2020   Family history of breast cancer 07/05/2020   Family history of prostate cancer 07/05/2020   Malignant neoplasm of upper-outer quadrant of left breast in female, estrogen receptor negative (HCC) 06/30/2020   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/23/2019   Primary osteoarthritis of both hands 06/23/2019   Trigger thumb of left hand 06/23/2019   Polyarthralgia 06/23/2019   Hypertension 10/30/2018   Atypical chest pain 10/30/2018   Migraine 10/30/2018   Hypothyroidism 10/30/2018   Osteoporosis 10/30/2018   GERD (gastroesophageal reflux disease)  10/30/2018   Anxiety in acute stress reaction 10/30/2018   Allergic rhinitis 10/30/2018   Vitamin D deficiency 10/30/2018    PCP: Christen Butter, NP  REFERRING PROVIDER: Monica Becton,*  REFERRING DIAG: M54.50 (ICD-10-CM) - Acute bilateral low back pain without sciatica  Rationale for Evaluation and Treatment: Rehabilitation  THERAPY DIAG:  Abnormal posture  Muscle spasm of back  Pain in thoracic spine  Muscle weakness (generalized)  Difficulty in walking, not elsewhere classified  ONSET DATE: ~1 month  SUBJECTIVE:                                                                                                                                                                                            SUBJECTIVE STATEMENT: Pt reports nothing new or different. A little pain in the lower midback.   PERTINENT HISTORY:  January 2024 R TKA  PAIN:  Are you having pain? Yes: NPRS scale: 1 "more fatigue", at worst 7/10 Pain location: Center lower midback Pain description: "almost like a spasm or catches" Aggravating factors: trying to get up (off bed primarily), sit<>stand, walking Relieving factors: heating pad/ice   PRECAUTIONS: None  WEIGHT BEARING RESTRICTIONS: No  FALLS:  Has patient fallen in last 6 months? No  LIVING ENVIRONMENT: Lives with: lives with their family Lives in: House/apartment Stairs: Yes: External: 3 steps; none Has following equipment at home: None  OCCUPATION: Paint and work in the yard  PLOF: Independent  PATIENT GOALS: Wants to return to painting and working in the yard and then get to walking  NEXT MD VISIT: n/a  OBJECTIVE:   DIAGNOSTIC FINDINGS:  03/03/23 Thoracic Spine x-ray IMPRESSION: 1. Moderate wedge-shaped compression deformity within the midthoracic spine, approximately T8, age indeterminate. 2. Severely exaggerated thoracic kyphosis.   03/03/23 Lumbar spine x-ray IMPRESSION:  1. Mild superior endplate compression deformity at L2, age  indeterminate.  2. Cortical irregularity at the anterosuperior corner of the L4  vertebral body, age indeterminate.   03/15/23 Lumbar MRI IMPRESSION: 1. No acute findings or clear explanation for the patient's symptoms. 2. Mild chronic superior endplate compression deformity at L2. No acute osseous findings. 3. Mild multilevel spondylosis as described. There is mild multifactorial spinal stenosis and lateral recess narrowing bilaterally at L3-4.    PATIENT SURVEYS:  FOTO 48; predicted 70   SENSATION: WFL except decreased sensation R lateral calf since knee replacement   MUSCLE LENGTH: Hamstrings: WNL Thomas  test: Did not assess  POSTURE: rounded shoulders, increased lumbar lordosis, and increased thoracic kyphosis  PALPATION: L>R thoracic paraspinal tightness and trigger points No overt tenderness with UPAs and CPAs  LUMBAR ROM:  AROM eval  Flexion 100%  Extension 80%  Right lateral flexion 80%  Left lateral flexion 100%  Right rotation 100%  Left rotation 100%   (Blank rows = not tested)  LOWER EXTREMITY ROM:     Active  Right eval Left eval  Hip flexion    Hip extension    Hip abduction    Hip adduction    Hip internal rotation    Hip external rotation    Knee flexion    Knee extension    Ankle dorsiflexion    Ankle plantarflexion    Ankle inversion    Ankle eversion     (Blank rows = not tested)  LOWER EXTREMITY MMT:    MMT Right eval Left eval  Hip flexion 3+ 4  Hip extension 3+ 4-  Hip abduction 3+ 3+  Hip adduction    Hip internal rotation    Hip external rotation    Knee flexion 4- 4+  Knee extension 4- 4+  Ankle dorsiflexion    Ankle plantarflexion    Ankle inversion    Ankle eversion     (Blank rows = not tested)   UPPER EXTREMITY MMT:  MMT Right eval Left eval  Shoulder flexion 5 5  Shoulder extension 4- 4-  Shoulder abduction 4+ 4+  Shoulder adduction    Shoulder internal rotation 5 5  Shoulder external rotation 3+ 4-  Middle trapezius 3 3  Lower trapezius    Elbow flexion    Elbow extension    Wrist flexion    Wrist extension    Wrist ulnar deviation    Wrist radial deviation    Wrist pronation    Wrist supination    Grip strength     (Blank rows = not tested)  LUMBAR SPECIAL TESTS:  Did not assess  FUNCTIONAL TESTS:  5 times sit to stand: TBA  GAIT: WNL  TODAY'S TREATMENT:   [facility] Adult PT Treatment:                                                DATE: 04/08/23 Therapeutic Exercise: Nustep L5; 2 x 5 min working on aerobic capacity/endurance Doorway pec stretch 2x 30 sec at 60 deg and 90 deg Lateral hip shift to  wall 2x30 sec R&L Standing palloff press red TB 10x5 sec Sitting on pball PPT x10 Circles x10 Heel slides with slider 2x10 LAQ x10 Eyes closed 2x30 sec Diagonals red med ball 2x10     [facility] Adult PT Treatment:                                                DATE: 04/03/23 Therapeutic Exercise: Nustep L5; 2 x 5 min working on aerobic capacity/endurance Seated horizontal abd red TB x10 Seated "W" green TB 2x10 Seated shoulder ER green TB 2x10 Standing against pool noodle Shoulder flexion 1# 2x10 Shoulders elevated with 1# and alternating marching x10 Shoulder abd 1# 2x10 Standing row green TB reactive iso 2x10 Standing shoulder ext green TB reactive iso 2x10    [facility] Adult PT Treatment:  DATE: 04/01/23 Therapeutic Exercise: Seated thoracic extension against coregeous ball x10 Seated upper trap stretch x30 sec R&L Seated open/book  Seated horizontal abd red TB x10 Seated "W" red TB 2x10 Seated shoulder ER red TB 2x10 Standing shoulder ext red TB 2x10 focusing on decreasing UT activity Standing row red TB reactive iso 2x10 Standing palloff press red TB x10 Manual Therapy: STM & TPR bilat UT Skilled assessment and palpation for TPDN Trigger Point Dry-Needling  Treatment instructions: Expect mild to moderate muscle soreness. S/S of pneumothorax if dry needled over a lung field, and to seek immediate medical attention should they occur. Patient verbalized understanding of these instructions and education.  Patient Consent Given: Yes Education handout provided: Previously provided Muscles treated: bilat UT Electrical stimulation performed: No Parameters: N/A Treatment response/outcome: Twitch response, decreased muscle tension Modalities TENS to bilat UT per pt tolerance x 15 min   [facility] Adult PT Treatment:                                                DATE: 03/25/23 Therapeutic Exercise: Nustep L5 x 5 min Seated thoracic  extension against coregeous ball x10 Seated thoracic rotation against coregeous ball x10 Seated open/book  Seated cervical retraction x10 Seated shoulder ER red TB 2x10 Seated "W" 2x10 Seated horizontal shoulder abd red TB 2x10 Seated diagonals yellow weighted ball x10 Seated "V" yellow weighted ball 2x5 Standing reactive iso row green TB x10 Standing reactive iso shoulder ext red TB x10 Alternating step tap forward 6" step 2x10 Alternating step tap sideways 6" step 2x10                                                                                                                             PATIENT EDUCATION:  Education details: Exam findings, POC, initial HEP Person educated: Patient Education method: Explanation, Demonstration, and Handouts Education comprehension: verbalized understanding, returned demonstration, and needs further education  HOME EXERCISE PROGRAM: Access Code: [email or web address] URL: [email or web address] Date: 03/18/2023 Prepared by: Vernon Prey April Kirstie Peri  Exercises - Sidelying Open Book Thoracic Lumbar Rotation and Extension  - 1 x daily - 7 x weekly - 1 sets - 10 reps - 3 sec hold - Supine Lower Trunk Rotation  - 1 x daily - 7 x weekly - 1 sets - 10 reps - 3 sec hold - Supine Piriformis Stretch with Foot on Ground  - 1 x daily - 7 x weekly - 1 sets - 30 sec hold - Supine Double Knee to Chest  - 1 x daily - 7 x weekly - 1 sets - 30 sec hold - Standing Paraspinals Mobilization with Small Ball on Wall  - 1 x daily - 7 x weekly - 1 sets - 30 sec hold  ASSESSMENT:  CLINICAL IMPRESSION: Continued to work on Teacher, English as a foreign language capacity. Initiated  more core strengthening/stabilization this session due to location of pt's pain a little lower than her thoracic spine now.   GOALS: Goals reviewed with patient? Yes  SHORT TERM GOALS: Target date: 04/15/2023  Pt will be ind with initial HEP Baseline: Goal status: INITIAL  2.  Pt will report at least 50%  improvement in back pain Baseline:  Goal status: INITIAL  3.  Pt will be able to transfer in/out of bed without pain Baseline:  Goal status: INITIAL   LONG TERM GOALS: Target date: 05/13/2023   Pt will be ind with management and progression of HEP Baseline:  Goal status: INITIAL  2.  Pt will be able to amb at least 1 mile per her personal goals s/p TKA Baseline:  Goal status: INITIAL  3.  Pt will be able to perform all transfers without pain Baseline:  Goal status: INITIAL  4.  Pt will have improved FOTO to >/=63 Baseline:  Goal status: INITIAL  5.  Pt will have 5x STS of </=13 sec to demo increased functional LE strength Baseline:  Goal status: INITIAL   PLAN:  PT FREQUENCY: 2x/week  PT DURATION: 8 weeks  PLANNED INTERVENTIONS: Therapeutic exercises, Therapeutic activity, Neuromuscular re-education, Balance training, Gait training, Patient/Family education, Self Care, Joint mobilization, Stair training, Dry Needling, Electrical stimulation, Spinal mobilization, Cryotherapy, Moist heat, Taping, Ionotophoresis 4mg /ml Dexamethasone, Manual therapy, and Re-evaluation.  PLAN FOR NEXT SESSION: Assess response to stretching HEP. Initiate core and midback strengthening. Manual work/TPDN for thoracic spine. Assess 5x STS   Aianna Fahs April Ma L Merrillyn Ackerley, PT 04/08/2023, 10:21 AM

## 2023-04-10 ENCOUNTER — Ambulatory Visit: Payer: Medicare Other | Attending: Sports Medicine

## 2023-04-10 DIAGNOSIS — R293 Abnormal posture: Secondary | ICD-10-CM | POA: Diagnosis not present

## 2023-04-10 DIAGNOSIS — R262 Difficulty in walking, not elsewhere classified: Secondary | ICD-10-CM | POA: Diagnosis present

## 2023-04-10 DIAGNOSIS — M546 Pain in thoracic spine: Secondary | ICD-10-CM | POA: Diagnosis present

## 2023-04-10 DIAGNOSIS — Z483 Aftercare following surgery for neoplasm: Secondary | ICD-10-CM | POA: Insufficient documentation

## 2023-04-10 DIAGNOSIS — M6281 Muscle weakness (generalized): Secondary | ICD-10-CM | POA: Diagnosis present

## 2023-04-10 DIAGNOSIS — M62830 Muscle spasm of back: Secondary | ICD-10-CM | POA: Diagnosis present

## 2023-04-10 NOTE — Therapy (Signed)
OUTPATIENT PHYSICAL THERAPY THORACOLUMBAR TREATMENT   Patient Name: Cindy [name] MRN: [id number] DOB:[birth year], 77 y.o., female Today's Date: 04/10/2023  END OF SESSION:  PT End of Session - 04/10/23 0933     Visit Number 7    Number of Visits 16    Date for PT Re-Evaluation 05/13/23    Authorization Type Medicare    Authorization - Visit Number 7    Progress Note Due on Visit 10    PT Start Time 0933    PT Stop Time 1015    PT Time Calculation (min) 42 min    Activity Tolerance Patient tolerated treatment well    Behavior During Therapy [facility] for tasks assessed/performed              Past Medical History:  Diagnosis Date   Abnormal mammogram    Allergic rhinitis 10/30/2018   Allergy    Tetracycoline. Sulfa   Anxiety in acute stress reaction 10/30/2018   Arthritis    Atypical chest pain 10/30/2018   [facility]cer [facility])    breast    Cataract    Both eyes surgery   Chest pain    "not cardiac related"   Colon polyps    Dyspnea    DOE   Family history of breast cancer 07/05/2020   Family history of pancreatic cancer 07/05/2020   Family history of prostate cancer 07/05/2020   Genetic testing 07/11/2020   GERD (gastroesophageal reflux disease) 10/30/2018   History of kidney stones    passed   Hypertension 10/30/2018   Hyperthyroidism    Hypothyroidism 10/30/2018   Migraine 10/30/2018   Osteoporosis 10/30/2018   Personal history of chemotherapy    Personal history of radiation therapy    Pneumonia    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   Vitamin D deficiency 10/30/2018   Past Surgical History:  Procedure Laterality Date   ABDOMINAL HYSTERECTOMY     BREAST BIOPSY Left 06/26/2020   x2   BREAST LUMPECTOMY Left 08/15/2020   BREAST LUMPECTOMY WITH RADIOACTIVE SEED AND SENTINEL LYMPH NODE BIOPSY Left 08/15/2020   Procedure: LEFT BREAST LUMPECTOMY WITH RADIOACTIVE SEED AND SENTINEL LYMPH NODE BIOPSY;  Surgeon: Almond Lint, MD;  Location: [facility] OR;   Service: General;  Laterality: Left;  RNFA   COLONOSCOPY W/ POLYPECTOMY     ESOPHAGEAL DILATION     EYE SURGERY Bilateral    catheter with lens   HYSTERECTOMY ABDOMINAL WITH SALPINGECTOMY     KNEE SURGERY Right 09/13/2022   PORT-A-CATH REMOVAL  01/04/2021   Procedure: REMOVAL PORT-A-CATH;  Surgeon: Almond Lint, MD;  Location: [facility];  Service: General;;   PORTACATH PLACEMENT Right 08/15/2020   Procedure: INSERTION PORT-A-CATH;  Surgeon: Almond Lint, MD;  Location: [facility] OR;  Service: General;  Laterality: Right;   TONSILLECTOMY     TUBAL LIGATION     Patient Active Problem List   Diagnosis Date Noted   Acute bilateral low back pain without sciatica 03/04/2023   Port-A-Cath in place 09/12/2020   Genetic testing 07/11/2020   Family history of breast cancer 07/05/2020   Family history of prostate cancer 07/05/2020   Malignant neoplasm of upper-outer quadrant of left breast in female, estrogen receptor negative (HCC) 06/30/2020   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/23/2019   Primary osteoarthritis of both hands 06/23/2019   Trigger thumb of left hand 06/23/2019   Polyarthralgia 06/23/2019   Hypertension 10/30/2018   Atypical chest pain 10/30/2018   Migraine 10/30/2018   Hypothyroidism 10/30/2018  Osteoporosis 10/30/2018   GERD (gastroesophageal reflux disease) 10/30/2018   Anxiety in acute stress reaction 10/30/2018   Allergic rhinitis 10/30/2018   Vitamin D deficiency 10/30/2018    PCP: Christen Butter, NP  REFERRING PROVIDER: Monica Becton,*  REFERRING DIAG: M54.50 (ICD-10-CM) - Acute bilateral low back pain without sciatica  Rationale for Evaluation and Treatment: Rehabilitation  THERAPY DIAG:  Abnormal posture  Muscle spasm of back  Pain in thoracic spine  Muscle weakness (generalized)  ONSET DATE: ~1 month  SUBJECTIVE:                                                                                                                                                                                            SUBJECTIVE STATEMENT: Patient reports she is feeling tired today, states she is unsure why her energy levels stay low. Patient reports pain in mid-back today, just some soreness from previous PT session.  PERTINENT HISTORY:  January 2024 R TKA  PAIN:  Are you having pain? Yes: NPRS scale: 1 "more fatigue", at worst 7/10 Pain location: Center lower midback Pain description: "almost like a spasm or catches" Aggravating factors: trying to get up (off bed primarily), sit<>stand, walking Relieving factors: heating pad/ice   PRECAUTIONS: None  WEIGHT BEARING RESTRICTIONS: No  FALLS:  Has patient fallen in last 6 months? No  LIVING ENVIRONMENT: Lives with: lives with their family Lives in: House/apartment Stairs: Yes: External: 3 steps; none Has following equipment at home: None  OCCUPATION: Paint and work in the yard  PLOF: Independent  PATIENT GOALS: Wants to return to painting and working in the yard and then get to walking  NEXT MD VISIT: n/a  OBJECTIVE:   DIAGNOSTIC FINDINGS:  03/03/23 Thoracic Spine x-ray IMPRESSION: 1. Moderate wedge-shaped compression deformity within the midthoracic spine, approximately T8, age indeterminate. 2. Severely exaggerated thoracic kyphosis.   03/03/23 Lumbar spine x-ray IMPRESSION:  1. Mild superior endplate compression deformity at L2, age  indeterminate.  2. Cortical irregularity at the anterosuperior corner of the L4  vertebral body, age indeterminate.   03/15/23 Lumbar MRI IMPRESSION: 1. No acute findings or clear explanation for the patient's symptoms. 2. Mild chronic superior endplate compression deformity at L2. No acute osseous findings. 3. Mild multilevel spondylosis as described. There is mild multifactorial spinal stenosis and lateral recess narrowing bilaterally at L3-4.    PATIENT SURVEYS:  FOTO 48; predicted 24   SENSATION: WFL except  decreased sensation R lateral calf since knee replacement   MUSCLE LENGTH: Hamstrings: WNL Thomas test: Did not assess  POSTURE: rounded shoulders, increased lumbar lordosis, and increased thoracic kyphosis  PALPATION: L>R  thoracic paraspinal tightness and trigger points No overt tenderness with UPAs and CPAs  LUMBAR ROM:   AROM eval  Flexion 100%  Extension 80%  Right lateral flexion 80%  Left lateral flexion 100%  Right rotation 100%  Left rotation 100%   (Blank rows = not tested)  LOWER EXTREMITY ROM:     Active  Right eval Left eval  Hip flexion    Hip extension    Hip abduction    Hip adduction    Hip internal rotation    Hip external rotation    Knee flexion    Knee extension    Ankle dorsiflexion    Ankle plantarflexion    Ankle inversion    Ankle eversion     (Blank rows = not tested)  LOWER EXTREMITY MMT:    MMT Right eval Left eval  Hip flexion 3+ 4  Hip extension 3+ 4-  Hip abduction 3+ 3+  Hip adduction    Hip internal rotation    Hip external rotation    Knee flexion 4- 4+  Knee extension 4- 4+  Ankle dorsiflexion    Ankle plantarflexion    Ankle inversion    Ankle eversion     (Blank rows = not tested)   UPPER EXTREMITY MMT:  MMT Right eval Left eval  Shoulder flexion 5 5  Shoulder extension 4- 4-  Shoulder abduction 4+ 4+  Shoulder adduction    Shoulder internal rotation 5 5  Shoulder external rotation 3+ 4-  Middle trapezius 3 3  Lower trapezius    Elbow flexion    Elbow extension    Wrist flexion    Wrist extension    Wrist ulnar deviation    Wrist radial deviation    Wrist pronation    Wrist supination    Grip strength     (Blank rows = not tested)  LUMBAR SPECIAL TESTS:  Did not assess  FUNCTIONAL TESTS:  5 times sit to stand: TBA  GAIT: WNL  TODAY'S TREATMENT:   [facility] Adult PT Treatment:                                                DATE: 04/10/2023 Therapeutic Exercise: NuStep interval training: L3  warm- up x 1 min, L3 <--> L5 every 30 sec x 5 min, L2 cool down x 1 min Doorway pec stretch 2x30 sec at 60 deg and 90 deg S/L elongation stretch (B) S/L straight leg small range hip abd (foot slide up wooden block) x10 (B) Sitting on Green PB: Paloff press GTB x10 (B) PPT x10 Diagonal arm pulls RTB x10 (B) Heel slides with slider x10 --> circles x10 CW/CCW each Diagonal red MB reaches x10 (B) Eyes closed --> added head turns Marching EO --> marching EC Lateral hip shift to wall x6 --> 3x10" sec R&L Seated green PB roll out front stretch   [facility] Adult PT Treatment:                                                DATE: 04/08/23 Therapeutic Exercise: Nustep L5; 2 x 5 min working on aerobic capacity/endurance Doorway pec stretch 2x 30 sec at 60 deg and 90 deg Lateral hip shift  to wall 2x30 sec R&L Standing palloff press red TB 10x5 sec Sitting on pball PPT x10 Circles x10 Heel slides with slider 2x10 LAQ x10 Eyes closed 2x30 sec Diagonals red med ball 2x10    [facility] Adult PT Treatment:                                                DATE: 04/03/23 Therapeutic Exercise: Nustep L5; 2 x 5 min working on aerobic capacity/endurance Seated horizontal abd red TB x10 Seated "W" green TB 2x10 Seated shoulder ER green TB 2x10 Standing against pool noodle Shoulder flexion 1# 2x10 Shoulders elevated with 1# and alternating marching x10 Shoulder abd 1# 2x10 Standing row green TB reactive iso 2x10 Standing shoulder ext green TB reactive iso 2x10    [facility] Adult PT Treatment:                                                DATE: 04/01/23 Therapeutic Exercise: Seated thoracic extension against coregeous ball x10 Seated upper trap stretch x30 sec R&L Seated open/book  Seated horizontal abd red TB x10 Seated "W" red TB 2x10 Seated shoulder ER red TB 2x10 Standing shoulder ext red TB 2x10 focusing on decreasing UT activity Standing row red TB reactive iso 2x10 Standing palloff press red TB  x10 Manual Therapy: STM & TPR bilat UT Skilled assessment and palpation for TPDN Trigger Point Dry-Needling  Treatment instructions: Expect mild to moderate muscle soreness. S/S of pneumothorax if dry needled over a lung field, and to seek immediate medical attention should they occur. Patient verbalized understanding of these instructions and education.  Patient Consent Given: Yes Education handout provided: Previously provided Muscles treated: bilat UT Electrical stimulation performed: No Parameters: N/A Treatment response/outcome: Twitch response, decreased muscle tension Modalities TENS to bilat UT per pt tolerance x 15 min                       PATIENT EDUCATION:  Education details: Exam findings, POC, initial HEP Person educated: Patient Education method: Explanation, Demonstration, and Handouts Education comprehension: verbalized understanding, returned demonstration, and needs further education  HOME EXERCISE PROGRAM: Access Code: [email or web address] URL: [email or web address] Date: 03/18/2023 Prepared by: Vernon Prey April Kirstie Peri  Exercises - Sidelying Open Book Thoracic Lumbar Rotation and Extension  - 1 x daily - 7 x weekly - 1 sets - 10 reps - 3 sec hold - Supine Lower Trunk Rotation  - 1 x daily - 7 x weekly - 1 sets - 10 reps - 3 sec hold - Supine Piriformis Stretch with Foot on Ground  - 1 x daily - 7 x weekly - 1 sets - 30 sec hold - Supine Double Knee to Chest  - 1 x daily - 7 x weekly - 1 sets - 30 sec hold - Standing Paraspinals Mobilization with Small Ball on Wall  - 1 x daily - 7 x weekly - 1 sets - 30 sec hold  ASSESSMENT:  CLINICAL IMPRESSION: Incorporated interval training on NuStep to progress endurance and stamina. Continued cre stability exercises seated on physioball; cueing provided to improve trunk elongation and postural awareness. Patient able to complete all exercises with no reported increase or exacerbation of symptoms.  GOALS: Goals  reviewed with patient? Yes  SHORT TERM GOALS: Target date: 04/15/2023  Pt will be ind with initial HEP Baseline: Goal status: INITIAL  2.  Pt will report at least 50% improvement in back pain Baseline:  Goal status: INITIAL  3.  Pt will be able to transfer in/out of bed without pain Baseline:  Goal status: INITIAL   LONG TERM GOALS: Target date: 05/13/2023  Pt will be ind with management and progression of HEP Baseline:  Goal status: INITIAL  2.  Pt will be able to amb at least 1 mile per her personal goals s/p TKA Baseline:  Goal status: INITIAL  3.  Pt will be able to perform all transfers without pain Baseline:  Goal status: INITIAL  4.  Pt will have improved FOTO to >/=63 Baseline:  Goal status: INITIAL  5.  Pt will have 5x STS of </=13 sec to demo increased functional LE strength Baseline:  Goal status: INITIAL   PLAN:  PT FREQUENCY: 2x/week  PT DURATION: 8 weeks  PLANNED INTERVENTIONS: Therapeutic exercises, Therapeutic activity, Neuromuscular re-education, Balance training, Gait training, Patient/Family education, Self Care, Joint mobilization, Stair training, Dry Needling, Electrical stimulation, Spinal mobilization, Cryotherapy, Moist heat, Taping, Ionotophoresis 4mg /ml Dexamethasone, Manual therapy, and Re-evaluation.  PLAN FOR NEXT SESSION: Continue core and midback strengthening. Manual work/TPDN for thoracic spine. Assess 5x STS   Sanjuana Mae, PTA 04/10/2023, 10:16 AM

## 2023-04-14 ENCOUNTER — Encounter: Payer: Self-pay | Admitting: Rehabilitative and Restorative Service Providers"

## 2023-04-14 ENCOUNTER — Ambulatory Visit: Payer: Medicare Other | Admitting: Rehabilitative and Restorative Service Providers"

## 2023-04-14 DIAGNOSIS — R293 Abnormal posture: Secondary | ICD-10-CM | POA: Diagnosis not present

## 2023-04-14 DIAGNOSIS — R262 Difficulty in walking, not elsewhere classified: Secondary | ICD-10-CM

## 2023-04-14 DIAGNOSIS — M62830 Muscle spasm of back: Secondary | ICD-10-CM | POA: Diagnosis not present

## 2023-04-14 DIAGNOSIS — Z483 Aftercare following surgery for neoplasm: Secondary | ICD-10-CM | POA: Diagnosis not present

## 2023-04-14 DIAGNOSIS — M6281 Muscle weakness (generalized): Secondary | ICD-10-CM | POA: Diagnosis not present

## 2023-04-14 DIAGNOSIS — M546 Pain in thoracic spine: Secondary | ICD-10-CM | POA: Diagnosis not present

## 2023-04-14 NOTE — Therapy (Signed)
OUTPATIENT PHYSICAL THERAPY THORACOLUMBAR TREATMENT  Patient Name: Maryluz Tees MRN: [id number] DOB:1946/01/22, 77 y.o., female Today's Date: 04/14/2023  END OF SESSION:  PT End of Session - 04/14/23 1010     Visit Number 8    Number of Visits 16    Date for PT Re-Evaluation 05/13/23    Authorization Type Medicare    Progress Note Due on Visit 10    PT Start Time 1015    PT Stop Time 1100    PT Time Calculation (min) 45 min    Activity Tolerance Patient tolerated treatment well    Behavior During Therapy [facility] for tasks assessed/performed            Past Medical History:  Diagnosis Date   Abnormal mammogram    Allergic rhinitis 10/30/2018   Allergy    Tetracycoline. Sulfa   Anxiety in acute stress reaction 10/30/2018   Arthritis    Atypical chest pain 10/30/2018   Cancer [facility])    breast    Cataract    Both eyes surgery   Chest pain    "not cardiac related"   Colon polyps    Dyspnea    DOE   Family history of breast cancer 07/05/2020   Family history of pancreatic cancer 07/05/2020   Family history of prostate cancer 07/05/2020   Genetic testing 07/11/2020   GERD (gastroesophageal reflux disease) 10/30/2018   History of kidney stones    passed   Hypertension 10/30/2018   Hyperthyroidism    Hypothyroidism 10/30/2018   Migraine 10/30/2018   Osteoporosis 10/30/2018   Personal history of chemotherapy    Personal history of radiation therapy    Pneumonia    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   Vitamin D deficiency 10/30/2018   Past Surgical History:  Procedure Laterality Date   ABDOMINAL HYSTERECTOMY     BREAST BIOPSY Left 06/26/2020   x2   BREAST LUMPECTOMY Left 08/15/2020   BREAST LUMPECTOMY WITH RADIOACTIVE SEED AND SENTINEL LYMPH NODE BIOPSY Left 08/15/2020   Procedure: LEFT BREAST LUMPECTOMY WITH RADIOACTIVE SEED AND SENTINEL LYMPH NODE BIOPSY;  Surgeon: Almond Lint, MD;  Location: [facility] OR;  Service: General;  Laterality: Left;  RNFA    COLONOSCOPY W/ POLYPECTOMY     ESOPHAGEAL DILATION     EYE SURGERY Bilateral    catheter with lens   HYSTERECTOMY ABDOMINAL WITH SALPINGECTOMY     KNEE SURGERY Right 09/13/2022   PORT-A-CATH REMOVAL  01/04/2021   Procedure: REMOVAL PORT-A-CATH;  Surgeon: Almond Lint, MD;  Location: [facility];  Service: General;;   PORTACATH PLACEMENT Right 08/15/2020   Procedure: INSERTION PORT-A-CATH;  Surgeon: Almond Lint, MD;  Location: [facility] OR;  Service: General;  Laterality: Right;   TONSILLECTOMY     TUBAL LIGATION     Patient Active Problem List   Diagnosis Date Noted   Acute bilateral low back pain without sciatica 03/04/2023   Port-A-Cath in place 09/12/2020   Genetic testing 07/11/2020   Family history of breast cancer 07/05/2020   Family history of prostate cancer 07/05/2020   Malignant neoplasm of upper-outer quadrant of left breast in female, estrogen receptor negative (HCC) 06/30/2020   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/23/2019   Primary osteoarthritis of both hands 06/23/2019   Trigger thumb of left hand 06/23/2019   Polyarthralgia 06/23/2019   Hypertension 10/30/2018   Atypical chest pain 10/30/2018   Migraine 10/30/2018   Hypothyroidism 10/30/2018   Osteoporosis 10/30/2018   GERD (gastroesophageal reflux disease) 10/30/2018  Anxiety in acute stress reaction 10/30/2018   Allergic rhinitis 10/30/2018   Vitamin D deficiency 10/30/2018    PCP: Christen Butter, NP REFERRING PROVIDER: Christen Butter, NP REFERRING DIAG: M54.50 (ICD-10-CM) - Acute bilateral low back pain without sciatica  Rationale for Evaluation and Treatment: Rehabilitation  THERAPY DIAG:  Abnormal posture  Muscle spasm of back  Pain in thoracic spine  Muscle weakness (generalized)  Difficulty in walking, not elsewhere classified  ONSET DATE: ~1 month  SUBJECTIVE:                                                                                                                                                                                            SUBJECTIVE STATEMENT: The patient continues with some pain upon rising in the morning. She notes getting up and down still leads to pain in mid back (mid to low thoracic region) that radiates around into lateral region (ribs bilaterally). 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 Symptoms improve once she gets up and moves. She no longer hurts 24/7 per report. Symptoms are improving with exercise.  PERTINENT HISTORY:  January 2024 R TKA, osteoporosis  PAIN:  Are you having pain? Yes: NPRS scale: 4/10 Pain location: Center lower midback Pain description: "almost like a spasm or catches" Aggravating factors: trying to get up (off bed primarily), sit<>stand, walking Relieving factors: heating pad/ice   PRECAUTIONS: None  WEIGHT BEARING RESTRICTIONS: No  FALLS:  Has patient fallen in last 6 months? No  LIVING ENVIRONMENT: Lives with: lives with their family Lives in: House/apartment Stairs: Yes: External: 3 steps; none Has following equipment at home: None  OCCUPATION: Paint and work in the yard  PATIENT GOALS: Wants to return to painting and working in the yard and then get to walking  OBJECTIVE:  (Measures in this section from initial evaluation unless otherwise noted) DIAGNOSTIC FINDINGS:  03/03/23 Thoracic Spine x-ray IMPRESSION: 1. Moderate wedge-shaped compression deformity within the midthoracic spine, approximately T8, age indeterminate. 2. Severely exaggerated thoracic kyphosis.   03/03/23 Lumbar spine x-ray IMPRESSION:  1. Mild superior endplate compression deformity at L2, age  indeterminate.  2. Cortical irregularity at the anterosuperior corner of the L4  vertebral body, age indeterminate.   03/15/23 Lumbar MRI IMPRESSION: 1. No acute findings or clear explanation for the patient's symptoms. 2. Mild chronic superior endplate compression deformity at L2. No acute osseous findings. 3. Mild  multilevel spondylosis as described. There is mild multifactorial spinal stenosis and lateral recess narrowing bilaterally at L3-4.   PATIENT SURVEYS:  FOTO 48; predicted 34   SENSATION: WFL except decreased sensation R lateral calf since knee replacement  MUSCLE LENGTH: Hamstrings: WNL Thomas test: Did not assess  POSTURE: rounded shoulders, increased lumbar lordosis, and increased thoracic kyphosis  PALPATION: L>R thoracic paraspinal tightness and trigger points No overt tenderness with UPAs and CPAs  LUMBAR ROM:  AROM eval  Flexion 100%  Extension 80%  Right lateral flexion 80%  Left lateral flexion 100%  Right rotation 100%  Left rotation 100%   (Blank rows = not tested)  LOWER EXTREMITY ROM:    Active  Right eval Left eval  Hip flexion    Hip extension    Hip abduction    Hip adduction    Hip internal rotation    Hip external rotation    Knee flexion    Knee extension    Ankle dorsiflexion    Ankle plantarflexion    Ankle inversion    Ankle eversion     (Blank rows = not tested)  LOWER EXTREMITY MMT:   MMT Right eval Left eval  Hip flexion 3+ 4  Hip extension 3+ 4-  Hip abduction 3+ 3+  Hip adduction    Hip internal rotation    Hip external rotation    Knee flexion 4- 4+  Knee extension 4- 4+  Ankle dorsiflexion    Ankle plantarflexion    Ankle inversion    Ankle eversion     (Blank rows = not tested)  UPPER EXTREMITY MMT: MMT Right eval Left eval  Shoulder flexion 5 5  Shoulder extension 4- 4-  Shoulder abduction 4+ 4+  Shoulder adduction    Shoulder internal rotation 5 5  Shoulder external rotation 3+ 4-  Middle trapezius 3 3  Lower trapezius    Elbow flexion    Elbow extension    Wrist flexion    Wrist extension    Wrist ulnar deviation    Wrist radial deviation    Wrist pronation    Wrist supination    Grip strength     (Blank rows = not tested)  FUNCTIONAL TESTS:  5 times sit to stand: TBA  GAIT: WNL  [facility] Adult  PT Treatment:                                                DATE: 04/14/23 Therapeutic Exercise: Nu-step interval training:  L3 warm up x 2 minutes, L3>L5 every 30 seconds x 5 minutes, L2 cool down x 1 minute Supine Dead bug with feet down (not beginning 90/90) Bridge with low marching x reps Deep breathing exercises with hand pressure on stomach and lateral ribs *patient uses accessory muscles in standing for breathing exercises Seated Sit<>Stand x 10 reps with focus on leading with chest opening Green physioball high rows, low rows x 10 reps with green band Green physioball with horizontal abduction green band x 10 reps Standing Marching with Ues overhead Backwards walking emphasizing wide base of support to engage lateral hips with CGA Rolling ball up a wall on solid surface and on foam standing  Red TB diagonal--middle adduction--diagonal alternating R and L x 10 reps Breathing exercises-- all shoulder elevation in standing Physioball roll with spinal lengthening + deep breathing  [facility] Adult PT Treatment:  DATE: 04/10/2023 Therapeutic Exercise: NuStep interval training: L3 warm- up x 1 min, L3 <--> L5 every 30 sec x 5 min, L2 cool down x 1 min Doorway pec stretch 2x30 sec at 60 deg and 90 deg S/L elongation stretch (B) S/L straight leg small range hip abd (foot slide up wooden block) x10 (B) Sitting on Green PB: Paloff press GTB x10 (B) PPT x10 Diagonal arm pulls RTB x10 (B) Heel slides with slider x10 --> circles x10 CW/CCW each Diagonal red MB reaches x10 (B) Eyes closed --> added head turns Marching EO --> marching EC Lateral hip shift to wall x6 --> 3x10" sec R&L Seated green PB roll out front stretch  PATIENT EDUCATION:  Education details: Exam findings, POC, initial HEP Person educated: Patient Education method: Explanation, Demonstration, and Handouts Education comprehension: verbalized understanding, returned  demonstration, and needs further education  HOME EXERCISE PROGRAM: Access Code: [email or web address] URL: [email or web address] Date: 03/18/2023 Prepared by: Vernon Prey April Kirstie Peri  Exercises - Sidelying Open Book Thoracic Lumbar Rotation and Extension  - 1 x daily - 7 x weekly - 1 sets - 10 reps - 3 sec hold - Supine Lower Trunk Rotation  - 1 x daily - 7 x weekly - 1 sets - 10 reps - 3 sec hold - Supine Piriformis Stretch with Foot on Ground  - 1 x daily - 7 x weekly - 1 sets - 30 sec hold - Supine Double Knee to Chest  - 1 x daily - 7 x weekly - 1 sets - 30 sec hold - Standing Paraspinals Mobilization with Small Ball on Wall  - 1 x daily - 7 x weekly - 1 sets - 30 sec hold  ASSESSMENT:  CLINICAL IMPRESSION: Patient reports improving pain, however continues with low thoracic pain that radiates bilaterally into lateral trunk. PT continued with current strengthening and also worked on engaging hip extensors in standing to reduce ribs shifting posteriorly over pelvis.  Patient needs cues with breathing to allow air to move into chest/abdomen instead of shoulders/accessory breathing. PT to continue working to STGs/LTGs.   GOALS: Goals reviewed with patient? Yes  SHORT TERM GOALS: Target date: 04/15/2023  Pt will be ind with initial HEP Baseline: indep with HEP Goal status:MET  2.  Pt will report at least 50% improvement in back pain Baseline:  Goal status: INITIAL  3.  Pt will be able to transfer in/out of bed without pain Baseline:  Goal status: INITIAL   LONG TERM GOALS: Target date: 05/13/2023  Pt will be ind with management and progression of HEP Baseline:  Goal status: INITIAL  2.  Pt will be able to amb at least 1 mile per her personal goals s/p TKA Baseline:  Goal status: INITIAL  3.  Pt will be able to perform all transfers without pain Baseline:  Goal status: INITIAL  4.  Pt will have improved FOTO to >/=63 Baseline:  Goal status: INITIAL  5.  Pt will  have 5x STS of </=13 sec to demo increased functional LE strength Baseline:  Goal status: INITIAL   PLAN:  PT FREQUENCY: 2x/week  PT DURATION: 8 weeks  PLANNED INTERVENTIONS: Therapeutic exercises, Therapeutic activity, Neuromuscular re-education, Balance training, Gait training, Patient/Family education, Self Care, Joint mobilization, Stair training, Dry Needling, Electrical stimulation, Spinal mobilization, Cryotherapy, Moist heat, Taping, Ionotophoresis 4mg /ml Dexamethasone, Manual therapy, and Re-evaluation.  PLAN FOR NEXT SESSION: Continue core and midback strengthening. Manual work/TPDN for thoracic spine. CHECK SHORT TERM GOALS, ASSESS 5 TSTS  ,, PT 04/14/2023, 10:12 AM

## 2023-04-15 ENCOUNTER — Ambulatory Visit (INDEPENDENT_AMBULATORY_CARE_PROVIDER_SITE_OTHER): Payer: Medicare Other | Admitting: Sports Medicine

## 2023-04-15 DIAGNOSIS — M47816 Spondylosis without myelopathy or radiculopathy, lumbar region: Secondary | ICD-10-CM

## 2023-04-15 NOTE — Progress Notes (Signed)
    Procedures performed today:    None.  Independent interpretation of notes and tests performed by another provider:   None.  Brief History, Exam, Impression, and Recommendations:    Lumbar spondylosis This is a very pleasant 77 year old female, she has had some chronic axial low back pain without radiculopathy worse with sitting, flexion, Valsalva, ultimately we treated conservatively, we also obtained an MRI that showed expected spondylitic changes for her age. She has improved dramatically with physical therapy, she only takes over-the-counter medicines now and is not needing any more tramadol. She can continue to take the tramadol as needed if she has a flare in pain. Of note she did have an elevated alkaline phosphatase on some routine labs at the cancer center, I am happy to recheck that today, considering chronic back pain I will also add urine and serum protein electrophoresis. Continue home conditioning and she can return to see me as needed.    ____________________________________________ Ihor Austin. Benjamin Stain, M.D., ABFM., CAQSM., AME. Primary Care and Sports Medicine [facility]  Adjunct Professor of Family Medicine  [employer]  Restaurant manager, fast food

## 2023-04-15 NOTE — Assessment & Plan Note (Signed)
This is a very pleasant 77 year old female, she has had some chronic axial low back pain without radiculopathy worse with sitting, flexion, Valsalva, ultimately we treated conservatively, we also obtained an MRI that showed expected spondylitic changes for her age. She has improved dramatically with physical therapy, she only takes over-the-counter medicines now and is not needing any more tramadol. She can continue to take the tramadol as needed if she has a flare in pain. Of note she did have an elevated alkaline phosphatase on some routine labs at the cancer center, I am happy to recheck that today, considering chronic back pain I will also add urine and serum protein electrophoresis. Continue home conditioning and she can return to see me as needed.

## 2023-04-19 LAB — PROTEIN ELECTROPHORESIS, SERUM, WITH REFLEX
A/G Ratio: 1.4 (ref 0.7–1.7)
Albumin ELP: 3.6 g/dL (ref 2.9–4.4)
Alpha 1: 0.3 g/dL (ref 0.0–0.4)
Alpha 2: 0.8 g/dL (ref 0.4–1.0)
Beta: 0.8 g/dL (ref 0.7–1.3)
Gamma Globulin: 0.7 g/dL (ref 0.4–1.8)
Globulin, Total: 2.6 g/dL (ref 2.2–3.9)

## 2023-04-19 LAB — COMPREHENSIVE METABOLIC PANEL WITH GFR
ALT: 11 IU/L (ref 0–32)
AST: 19 IU/L (ref 0–40)
Albumin: 4 g/dL (ref 3.8–4.8)
Alkaline Phosphatase: 160 IU/L — ABNORMAL HIGH (ref 44–121)
BUN/Creatinine Ratio: 19 (ref 12–28)
BUN: 15 mg/dL (ref 8–27)
Bilirubin Total: 0.4 mg/dL (ref 0.0–1.2)
CO2: 22 mmol/L (ref 20–29)
Calcium: 8 mg/dL — ABNORMAL LOW (ref 8.7–10.3)
Chloride: 109 mmol/L — ABNORMAL HIGH (ref 96–106)
Creatinine, Ser: 0.8 mg/dL (ref 0.57–1.00)
Globulin, Total: 2.2 g/dL (ref 1.5–4.5)
Glucose: 84 mg/dL (ref 70–99)
Potassium: 4.1 mmol/L (ref 3.5–5.2)
Sodium: 142 mmol/L (ref 134–144)
Total Protein: 6.2 g/dL (ref 6.0–8.5)
eGFR: 75 mL/min/1.73 (ref >59)

## 2023-04-19 LAB — CBC
Hematocrit: 43.6 % (ref 34.0–46.6)
Hemoglobin: 14 g/dL (ref 11.1–15.9)
MCH: 30.5 pg (ref 26.6–33.0)
MCHC: 32.1 g/dL (ref 31.5–35.7)
MCV: 95 fL (ref 79–97)
Platelets: 198 x10E3/uL (ref 150–450)
RBC: 4.59 x10E6/uL (ref 3.77–5.28)
RDW: 13.3 % (ref 11.7–15.4)
WBC: 4.4 x10E3/uL (ref 3.4–10.8)

## 2023-04-19 LAB — PROTEIN ELECTROPHORESIS,RANDOM URN
Albumin ELP, Urine: 28.8 %
Alpha-1-Globulin, U: 7.7 %
Alpha-2-Globulin, U: 14.3 %
Beta Globulin, U: 35.3 %
Creatinine, Urine: 156.2 mg/dL
Gamma Globulin, U: 13.8 %
Protein, Ur: 15.5 mg/dL

## 2023-04-22 ENCOUNTER — Ambulatory Visit: Payer: Medicare Other | Admitting: Physical Therapy

## 2023-04-22 ENCOUNTER — Encounter: Payer: Self-pay | Admitting: Physical Therapy

## 2023-04-22 DIAGNOSIS — R262 Difficulty in walking, not elsewhere classified: Secondary | ICD-10-CM | POA: Diagnosis not present

## 2023-04-22 DIAGNOSIS — M546 Pain in thoracic spine: Secondary | ICD-10-CM | POA: Diagnosis not present

## 2023-04-22 DIAGNOSIS — M62830 Muscle spasm of back: Secondary | ICD-10-CM

## 2023-04-22 DIAGNOSIS — R293 Abnormal posture: Secondary | ICD-10-CM

## 2023-04-22 DIAGNOSIS — M6281 Muscle weakness (generalized): Secondary | ICD-10-CM | POA: Diagnosis not present

## 2023-04-22 DIAGNOSIS — Z483 Aftercare following surgery for neoplasm: Secondary | ICD-10-CM | POA: Diagnosis not present

## 2023-04-22 NOTE — Therapy (Signed)
OUTPATIENT PHYSICAL THERAPY THORACOLUMBAR TREATMENT  Patient Name: Cindy [name] MRN: [id number] DOB:[birth year], 77 y.o., female Today's Date: 04/22/2023  END OF SESSION:  PT End of Session - 04/22/23 1441     Visit Number 9    Number of Visits 16    Date for PT Re-Evaluation 05/13/23    Authorization Type Medicare    Progress Note Due on Visit 10    PT Start Time 1441    PT Stop Time 1520    PT Time Calculation (min) 39 min    Activity Tolerance Patient tolerated treatment well    Behavior During Therapy [facility] for tasks assessed/performed            Past Medical History:  Diagnosis Date   Abnormal mammogram    Allergic rhinitis 10/30/2018   Allergy    Tetracycoline. Sulfa   Anxiety in acute stress reaction 10/30/2018   Arthritis    Atypical chest pain 10/30/2018   Cancer [facility])    breast    Cataract    Both eyes surgery   Chest pain    "not cardiac related"   Colon polyps    Dyspnea    DOE   Family history of breast cancer 07/05/2020   Family history of pancreatic cancer 07/05/2020   Family history of prostate cancer 07/05/2020   Genetic testing 07/11/2020   GERD (gastroesophageal reflux disease) 10/30/2018   History of kidney stones    passed   Hypertension 10/30/2018   Hyperthyroidism    Hypothyroidism 10/30/2018   Migraine 10/30/2018   Osteoporosis 10/30/2018   Personal history of chemotherapy    Personal history of radiation therapy    Pneumonia    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   Vitamin D deficiency 10/30/2018   Past Surgical History:  Procedure Laterality Date   ABDOMINAL HYSTERECTOMY     BREAST BIOPSY Left 06/26/2020   x2   BREAST LUMPECTOMY Left 08/15/2020   BREAST LUMPECTOMY WITH RADIOACTIVE SEED AND SENTINEL LYMPH NODE BIOPSY Left 08/15/2020   Procedure: LEFT BREAST LUMPECTOMY WITH RADIOACTIVE SEED AND SENTINEL LYMPH NODE BIOPSY;  Surgeon: Almond Lint, MD;  Location: [facility] OR;  Service: General;  Laterality: Left;  RNFA    COLONOSCOPY W/ POLYPECTOMY     ESOPHAGEAL DILATION     EYE SURGERY Bilateral    catheter with lens   HYSTERECTOMY ABDOMINAL WITH SALPINGECTOMY     KNEE SURGERY Right 09/13/2022   PORT-A-CATH REMOVAL  01/04/2021   Procedure: REMOVAL PORT-A-CATH;  Surgeon: Almond Lint, MD;  Location: [facility];  Service: General;;   PORTACATH PLACEMENT Right 08/15/2020   Procedure: INSERTION PORT-A-CATH;  Surgeon: Almond Lint, MD;  Location: [facility] OR;  Service: General;  Laterality: Right;   TONSILLECTOMY     TUBAL LIGATION     Patient Active Problem List   Diagnosis Date Noted   Lumbar spondylosis 03/04/2023   Port-A-Cath in place 09/12/2020   Genetic testing 07/11/2020   Family history of breast cancer 07/05/2020   Family history of prostate cancer 07/05/2020   Malignant neoplasm of upper-outer quadrant of left breast in female, estrogen receptor negative (HCC) 06/30/2020   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/23/2019   Primary osteoarthritis of both hands 06/23/2019   Trigger thumb of left hand 06/23/2019   Polyarthralgia 06/23/2019   Hypertension 10/30/2018   Atypical chest pain 10/30/2018   Migraine 10/30/2018   Hypothyroidism 10/30/2018   Osteoporosis 10/30/2018   GERD (gastroesophageal reflux disease) 10/30/2018   Anxiety in acute stress reaction  10/30/2018   Allergic rhinitis 10/30/2018   Vitamin D deficiency 10/30/2018    PCP: Christen Butter, NP REFERRING PROVIDER: Monica Becton,* REFERRING DIAG: M54.50 (ICD-10-CM) - Acute bilateral low back pain without sciatica  Rationale for Evaluation and Treatment: Rehabilitation  THERAPY DIAG:  Abnormal posture  Muscle spasm of back  Pain in thoracic spine  Muscle weakness (generalized)  Difficulty in walking, not elsewhere classified  ONSET DATE: ~1 month  SUBJECTIVE:                                                                                                                                                                                            SUBJECTIVE STATEMENT: Pt states she saw Dr. Karie Schwalbe and nothing else new to report. Pt reports pain is fine this afternoon. Some in the morning when she gets up and then it goes away. Thinks her endurance has been improving but will still get short of breath. Pt notes difficulty with floor to stand transfers.  PERTINENT HISTORY:  January 2024 R TKA, osteoporosis  PAIN:  Are you having pain? Yes: NPRS scale: 4/10 Pain location: Center lower midback Pain description: "almost like a spasm or catches" Aggravating factors: trying to get up (off bed primarily), sit<>stand, walking Relieving factors: heating pad/ice   PRECAUTIONS: None  WEIGHT BEARING RESTRICTIONS: No  FALLS:  Has patient fallen in last 6 months? No  LIVING ENVIRONMENT: Lives with: lives with their family Lives in: House/apartment Stairs: Yes: External: 3 steps; none Has following equipment at home: None  OCCUPATION: Paint and work in the yard  PATIENT GOALS: Wants to return to painting and working in the yard and then get to walking  OBJECTIVE:  (Measures in this section from initial evaluation unless otherwise noted) DIAGNOSTIC FINDINGS:  03/03/23 Thoracic Spine x-ray IMPRESSION: 1. Moderate wedge-shaped compression deformity within the midthoracic spine, approximately T8, age indeterminate. 2. Severely exaggerated thoracic kyphosis.   03/03/23 Lumbar spine x-ray IMPRESSION:  1. Mild superior endplate compression deformity at L2, age  indeterminate.  2. Cortical irregularity at the anterosuperior corner of the L4  vertebral body, age indeterminate.   03/15/23 Lumbar MRI IMPRESSION: 1. No acute findings or clear explanation for the patient's symptoms. 2. Mild chronic superior endplate compression deformity at L2. No acute osseous findings. 3. Mild multilevel spondylosis as described. There is mild multifactorial spinal stenosis and lateral recess narrowing bilaterally  at L3-4.   PATIENT SURVEYS:  FOTO 48; predicted 5   SENSATION: WFL except decreased sensation R lateral calf since knee replacement   MUSCLE LENGTH: Hamstrings: WNL Thomas test: Did not assess  POSTURE: rounded shoulders, increased lumbar  lordosis, and increased thoracic kyphosis  PALPATION: L>R thoracic paraspinal tightness and trigger points No overt tenderness with UPAs and CPAs  LUMBAR ROM:  AROM eval  Flexion 100%  Extension 80%  Right lateral flexion 80%  Left lateral flexion 100%  Right rotation 100%  Left rotation 100%   (Blank rows = not tested)  LOWER EXTREMITY ROM:    Active  Right eval Left eval  Hip flexion    Hip extension    Hip abduction    Hip adduction    Hip internal rotation    Hip external rotation    Knee flexion    Knee extension    Ankle dorsiflexion    Ankle plantarflexion    Ankle inversion    Ankle eversion     (Blank rows = not tested)  LOWER EXTREMITY MMT:   MMT Right eval Left eval  Hip flexion 3+ 4  Hip extension 3+ 4-  Hip abduction 3+ 3+  Hip adduction    Hip internal rotation    Hip external rotation    Knee flexion 4- 4+  Knee extension 4- 4+  Ankle dorsiflexion    Ankle plantarflexion    Ankle inversion    Ankle eversion     (Blank rows = not tested)  UPPER EXTREMITY MMT: MMT Right eval Left eval  Shoulder flexion 5 5  Shoulder extension 4- 4-  Shoulder abduction 4+ 4+  Shoulder adduction    Shoulder internal rotation 5 5  Shoulder external rotation 3+ 4-  Middle trapezius 3 3  Lower trapezius    Elbow flexion    Elbow extension    Wrist flexion    Wrist extension    Wrist ulnar deviation    Wrist radial deviation    Wrist pronation    Wrist supination    Grip strength     (Blank rows = not tested)  FUNCTIONAL TESTS:  5 times sit to stand: 17.91 sec 04/22/23  GAIT: WNL  [facility] Adult PT Treatment:                                                DATE: 04/22/23 Therapeutic Exercise: Nu-step  interval training:  L5 warm up x 2 minutes, L5 at 90 SPM x 1 min, 2 min at comfortable pace in between for a total of 10 min Standing hip clock reach with wash rag 2x10 Sit<>stand with red TB around knees 2x10 Eccentric sit<>stand red TB around knees x5 Shoulder ext green TB with marching 2x10 "W" green TB with backwards lunge 2x10 Antitrunk rotation side step green TB x10   [facility] Adult PT Treatment:                                                DATE: 04/14/23 Therapeutic Exercise: Nu-step interval training:  L5 warm up x 2 minutes, L3>L5 every 30 seconds x 5 minutes, L2 cool down x 1 minute Supine Dead bug with feet down (not beginning 90/90) Bridge with low marching x reps Deep breathing exercises with hand pressure on stomach and lateral ribs *patient uses accessory muscles in standing for breathing exercises Seated Sit<>Stand x 10 reps with focus on leading with chest opening Green physioball high  rows, low rows x 10 reps with green band Green physioball with horizontal abduction green band x 10 reps Standing Marching with Ues overhead Backwards walking emphasizing wide base of support to engage lateral hips with CGA Rolling ball up a wall on solid surface and on foam standing  Red TB diagonal--middle adduction--diagonal alternating R and L x 10 reps Breathing exercises-- all shoulder elevation in standing Physioball roll with spinal lengthening + deep breathing  [facility] Adult PT Treatment:                                                DATE: 04/10/2023 Therapeutic Exercise: NuStep interval training: L3 warm- up x 1 min, L3 <--> L5 every 30 sec x 5 min, L2 cool down x 1 min Doorway pec stretch 2x30 sec at 60 deg and 90 deg S/L elongation stretch (B) S/L straight leg small range hip abd (foot slide up wooden block) x10 (B) Sitting on Green PB: Paloff press GTB x10 (B) PPT x10 Diagonal arm pulls RTB x10 (B) Heel slides with slider x10 --> circles x10 CW/CCW each Diagonal red MB  reaches x10 (B) Eyes closed --> added head turns Marching EO --> marching EC Lateral hip shift to wall x6 --> 3x10" sec R&L Seated green PB roll out front stretch  PATIENT EDUCATION:  Education details: Exam findings, POC, initial HEP Person educated: Patient Education method: Explanation, Demonstration, and Handouts Education comprehension: verbalized understanding, returned demonstration, and needs further education  HOME EXERCISE PROGRAM: Access Code: [email or web address] URL: [email or web address] Date: 03/18/2023 Prepared by: Vernon Prey April Kirstie Peri  Exercises - Sidelying Open Book Thoracic Lumbar Rotation and Extension  - 1 x daily - 7 x weekly - 1 sets - 10 reps - 3 sec hold - Supine Lower Trunk Rotation  - 1 x daily - 7 x weekly - 1 sets - 10 reps - 3 sec hold - Supine Piriformis Stretch with Foot on Ground  - 1 x daily - 7 x weekly - 1 sets - 30 sec hold - Supine Double Knee to Chest  - 1 x daily - 7 x weekly - 1 sets - 30 sec hold - Standing Paraspinals Mobilization with Small Ball on Wall  - 1 x daily - 7 x weekly - 1 sets - 30 sec hold  ASSESSMENT:  CLINICAL IMPRESSION: Pt reports difficulty with floor to standing transfers. Worked on gross LE strengthening to work on this. Focused on improving knee alignment during sit to stands and increasing R LE weight shifting. Pt has met her STGs.   GOALS: Goals reviewed with patient? Yes  SHORT TERM GOALS: Target date: 04/15/2023  Pt will be ind with initial HEP Baseline: indep with HEP Goal status:MET  2.  Pt will report at least 50% improvement in back pain Baseline:  04/22/23: 90% improvement Goal status: MET  3.  Pt will be able to transfer in/out of bed without pain Baseline:  04/22/23: "At times but not much" Goal status: MET   LONG TERM GOALS: Target date: 05/13/2023  Pt will be ind with management and progression of HEP Baseline:  Goal status: INITIAL  2.  Pt will be able to amb at least 1 mile per her  personal goals s/p TKA Baseline:  Goal status: INITIAL  3.  Pt will be able to perform all transfers without pain Baseline:  Goal status: INITIAL  4.  Pt will have improved FOTO to >/=63 Baseline:  Goal status: INITIAL  5.  Pt will have 5x STS of </=13 sec to demo increased functional LE strength Baseline:  04/22/23: 17.91 sec Goal status: INITIAL   PLAN:  PT FREQUENCY: 2x/week  PT DURATION: 8 weeks  PLANNED INTERVENTIONS: Therapeutic exercises, Therapeutic activity, Neuromuscular re-education, Balance training, Gait training, Patient/Family education, Self Care, Joint mobilization, Stair training, Dry Needling, Electrical stimulation, Spinal mobilization, Cryotherapy, Moist heat, Taping, Ionotophoresis 4mg /ml Dexamethasone, Manual therapy, and Re-evaluation.  PLAN FOR NEXT SESSION: Continue core and midback strengthening. Manual work/TPDN for thoracic spine.   April Ma L , PT 04/22/2023, 2:42 PM

## 2023-04-24 ENCOUNTER — Encounter: Payer: Self-pay | Admitting: Physical Therapy

## 2023-04-24 ENCOUNTER — Ambulatory Visit: Payer: Medicare Other | Admitting: Physical Therapy

## 2023-04-24 DIAGNOSIS — M546 Pain in thoracic spine: Secondary | ICD-10-CM

## 2023-04-24 DIAGNOSIS — R293 Abnormal posture: Secondary | ICD-10-CM | POA: Diagnosis not present

## 2023-04-24 DIAGNOSIS — M62830 Muscle spasm of back: Secondary | ICD-10-CM

## 2023-04-24 DIAGNOSIS — M6281 Muscle weakness (generalized): Secondary | ICD-10-CM

## 2023-04-24 DIAGNOSIS — R262 Difficulty in walking, not elsewhere classified: Secondary | ICD-10-CM

## 2023-04-24 DIAGNOSIS — Z483 Aftercare following surgery for neoplasm: Secondary | ICD-10-CM | POA: Diagnosis not present

## 2023-04-24 NOTE — Therapy (Addendum)
OUTPATIENT PHYSICAL THERAPY THORACOLUMBAR TREATMENT AND 10TH VISIT PROGRESS NOTE  Progress Note Reporting Period 03/18/23 to 04/24/23  See note below for Objective Data and Assessment of Progress/Goals.      Patient Name: Cindy [name] MRN: [id number] DOB:1946/03/19, 77 y.o., female Today's Date: 04/24/2023  END OF SESSION:  PT End of Session - 04/24/23 1150     Visit Number 10    Number of Visits 16    Date for PT Re-Evaluation 05/13/23    Authorization Type Medicare    Progress Note Due on Visit 10    PT Start Time 1150    PT Stop Time 1230    PT Time Calculation (min) 40 min    Activity Tolerance Patient tolerated treatment well    Behavior During Therapy [facility] for tasks assessed/performed            Past Medical History:  Diagnosis Date   Abnormal mammogram    Allergic rhinitis 10/30/2018   Allergy    Tetracycoline. Sulfa   Anxiety in acute stress reaction 10/30/2018   Arthritis    Atypical chest pain 10/30/2018   Cancer [facility])    breast    Cataract    Both eyes surgery   Chest pain    "not cardiac related"   Colon polyps    Dyspnea    DOE   Family history of breast cancer 07/05/2020   Family history of pancreatic cancer 07/05/2020   Family history of prostate cancer 07/05/2020   Genetic testing 07/11/2020   GERD (gastroesophageal reflux disease) 10/30/2018   History of kidney stones    passed   Hypertension 10/30/2018   Hyperthyroidism    Hypothyroidism 10/30/2018   Migraine 10/30/2018   Osteoporosis 10/30/2018   Personal history of chemotherapy    Personal history of radiation therapy    Pneumonia    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   Vitamin D deficiency 10/30/2018   Past Surgical History:  Procedure Laterality Date   ABDOMINAL HYSTERECTOMY     BREAST BIOPSY Left 06/26/2020   x2   BREAST LUMPECTOMY Left 08/15/2020   BREAST LUMPECTOMY WITH RADIOACTIVE SEED AND SENTINEL LYMPH NODE BIOPSY Left 08/15/2020   Procedure: LEFT  BREAST LUMPECTOMY WITH RADIOACTIVE SEED AND SENTINEL LYMPH NODE BIOPSY;  Surgeon: Almond Lint, MD;  Location: [facility] OR;  Service: General;  Laterality: Left;  RNFA   COLONOSCOPY W/ POLYPECTOMY     ESOPHAGEAL DILATION     EYE SURGERY Bilateral    catheter with lens   HYSTERECTOMY ABDOMINAL WITH SALPINGECTOMY     KNEE SURGERY Right 09/13/2022   PORT-A-CATH REMOVAL  01/04/2021   Procedure: REMOVAL PORT-A-CATH;  Surgeon: Almond Lint, MD;  Location: [facility];  Service: General;;   PORTACATH PLACEMENT Right 08/15/2020   Procedure: INSERTION PORT-A-CATH;  Surgeon: Almond Lint, MD;  Location: [facility] OR;  Service: General;  Laterality: Right;   TONSILLECTOMY     TUBAL LIGATION     Patient Active Problem List   Diagnosis Date Noted   Lumbar spondylosis 03/04/2023   Port-A-Cath in place 09/12/2020   Genetic testing 07/11/2020   Family history of breast cancer 07/05/2020   Family history of prostate cancer 07/05/2020   Malignant neoplasm of upper-outer quadrant of left breast in female, estrogen receptor negative (HCC) 06/30/2020   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/23/2019   Primary osteoarthritis of both hands 06/23/2019   Trigger thumb of left hand 06/23/2019   Polyarthralgia 06/23/2019   Hypertension 10/30/2018   Atypical chest  pain 10/30/2018   Migraine 10/30/2018   Hypothyroidism 10/30/2018   Osteoporosis 10/30/2018   GERD (gastroesophageal reflux disease) 10/30/2018   Anxiety in acute stress reaction 10/30/2018   Allergic rhinitis 10/30/2018   Vitamin D deficiency 10/30/2018    PCP: Christen Butter, NP REFERRING PROVIDER: Monica Becton,* REFERRING DIAG: M54.50 (ICD-10-CM) - Acute bilateral low back pain without sciatica  Rationale for Evaluation and Treatment: Rehabilitation  THERAPY DIAG:  Abnormal posture  Muscle spasm of back  Pain in thoracic spine  Muscle weakness (generalized)  Difficulty in walking, not elsewhere classified  ONSET  DATE: ~1 month  SUBJECTIVE:                                                                                                                                                                                           SUBJECTIVE STATEMENT: Pt notes nothing new or different. A little sore after last session but not bad.   PERTINENT HISTORY:  January 2024 R TKA, osteoporosis  PAIN:  Are you having pain? Yes: NPRS scale: 2/10 Pain location: Center lower midback Pain description: "almost like a spasm or catches" Aggravating factors: trying to get up (off bed primarily), sit<>stand, walking Relieving factors: heating pad/ice   PRECAUTIONS: None  WEIGHT BEARING RESTRICTIONS: No  FALLS:  Has patient fallen in last 6 months? No  LIVING ENVIRONMENT: Lives with: lives with their family Lives in: House/apartment Stairs: Yes: External: 3 steps; none Has following equipment at home: None  OCCUPATION: Paint and work in the yard  PATIENT GOALS: Wants to return to painting and working in the yard and then get to walking  OBJECTIVE:  (Measures in this section from initial evaluation unless otherwise noted) DIAGNOSTIC FINDINGS:  03/03/23 Thoracic Spine x-ray IMPRESSION: 1. Moderate wedge-shaped compression deformity within the midthoracic spine, approximately T8, age indeterminate. 2. Severely exaggerated thoracic kyphosis.   03/03/23 Lumbar spine x-ray IMPRESSION:  1. Mild superior endplate compression deformity at L2, age  indeterminate.  2. Cortical irregularity at the anterosuperior corner of the L4  vertebral body, age indeterminate.   03/15/23 Lumbar MRI IMPRESSION: 1. No acute findings or clear explanation for the patient's symptoms. 2. Mild chronic superior endplate compression deformity at L2. No acute osseous findings. 3. Mild multilevel spondylosis as described. There is mild multifactorial spinal stenosis and lateral recess narrowing bilaterally at L3-4.   PATIENT SURVEYS:   FOTO 48; predicted 33   SENSATION: WFL except decreased sensation R lateral calf since knee replacement   MUSCLE LENGTH: Hamstrings: WNL Thomas test: Did not assess  POSTURE: rounded shoulders, increased lumbar lordosis, and increased thoracic kyphosis  PALPATION: L>R thoracic  paraspinal tightness and trigger points No overt tenderness with UPAs and CPAs  LUMBAR ROM:  AROM eval  Flexion 100%  Extension 80%  Right lateral flexion 80%  Left lateral flexion 100%  Right rotation 100%  Left rotation 100%   (Blank rows = not tested)  LOWER EXTREMITY ROM:    Active  Right eval Left eval  Hip flexion    Hip extension    Hip abduction    Hip adduction    Hip internal rotation    Hip external rotation    Knee flexion    Knee extension    Ankle dorsiflexion    Ankle plantarflexion    Ankle inversion    Ankle eversion     (Blank rows = not tested)  LOWER EXTREMITY MMT:   MMT Right eval Left eval  Hip flexion 3+ 4  Hip extension 3+ 4-  Hip abduction 3+ 3+  Hip adduction    Hip internal rotation    Hip external rotation    Knee flexion 4- 4+  Knee extension 4- 4+  Ankle dorsiflexion    Ankle plantarflexion    Ankle inversion    Ankle eversion     (Blank rows = not tested)  UPPER EXTREMITY MMT: MMT Right eval Left eval  Shoulder flexion 5 5  Shoulder extension 4- 4-  Shoulder abduction 4+ 4+  Shoulder adduction    Shoulder internal rotation 5 5  Shoulder external rotation 3+ 4-  Middle trapezius 3 3  Lower trapezius    Elbow flexion    Elbow extension    Wrist flexion    Wrist extension    Wrist ulnar deviation    Wrist radial deviation    Wrist pronation    Wrist supination    Grip strength     (Blank rows = not tested)  FUNCTIONAL TESTS:  5 times sit to stand: 17.91 sec 04/22/23  GAIT: WNL  [facility] Adult PT Treatment:                                                DATE: 04/24/23 Therapeutic Exercise: Seated pball 3 way roll outs x10 Seated  thoracic rotation + extension x10 Mat table to half kneel on airex x10 Half kneeling on airex 2x10 sec Half kneeling hands forward/backward x8 Nu-step interval training:  L5 warm up x 2 minutes, L5 at 90 SPM x 1 min, 2 min at comfortable pace in between for a total of 15 min   [facility] Adult PT Treatment:                                                DATE: 04/22/23 Therapeutic Exercise: Nu-step interval training:  L5 warm up x 2 minutes, L5 at 90 SPM x 1 min, 2 min at comfortable pace in between for a total of 10 min Standing hip clock reach with wash rag 2x10 Sit<>stand with red TB around knees 2x10 Eccentric sit<>stand red TB around knees x5 Shoulder ext green TB with marching 2x10 "W" green TB with backwards lunge 2x10 Antitrunk rotation side step green TB x10   [facility] Adult PT Treatment:  DATE: 04/14/23 Therapeutic Exercise: Nu-step interval training:  L5 warm up x 2 minutes, L3>L5 every 30 seconds x 5 minutes, L2 cool down x 1 minute Supine Dead bug with feet down (not beginning 90/90) Bridge with low marching x reps Deep breathing exercises with hand pressure on stomach and lateral ribs *patient uses accessory muscles in standing for breathing exercises Seated Sit<>Stand x 10 reps with focus on leading with chest opening Green physioball high rows, low rows x 10 reps with green band Green physioball with horizontal abduction green band x 10 reps Standing Marching with Ues overhead Backwards walking emphasizing wide base of support to engage lateral hips with CGA Rolling ball up a wall on solid surface and on foam standing  Red TB diagonal--middle adduction--diagonal alternating R and L x 10 reps Breathing exercises-- all shoulder elevation in standing Physioball roll with spinal lengthening + deep breathing  PATIENT EDUCATION:  Education details: Exam findings, POC, initial HEP Person educated: Patient Education method: Explanation,  Demonstration, and Handouts Education comprehension: verbalized understanding, returned demonstration, and needs further education  HOME EXERCISE PROGRAM: Access Code: [email or web address] URL: [email or web address] Date: 03/18/2023 Prepared by: Vernon Prey April Kirstie Peri  Exercises - Sidelying Open Book Thoracic Lumbar Rotation and Extension  - 1 x daily - 7 x weekly - 1 sets - 10 reps - 3 sec hold - Supine Lower Trunk Rotation  - 1 x daily - 7 x weekly - 1 sets - 10 reps - 3 sec hold - Supine Piriformis Stretch with Foot on Ground  - 1 x daily - 7 x weekly - 1 sets - 30 sec hold - Supine Double Knee to Chest  - 1 x daily - 7 x weekly - 1 sets - 30 sec hold - Standing Paraspinals Mobilization with Small Ball on Wall  - 1 x daily - 7 x weekly - 1 sets - 30 sec hold  ASSESSMENT:  CLINICAL IMPRESSION: Continued to work on strengthening for floor to stand t/fs. Increased pt's time on Nustep to train her endurance. She has met all of her STGs at this time and making good progress towards her LTGs.   GOALS: Goals reviewed with patient? Yes  SHORT TERM GOALS: Target date: 04/15/2023  Pt will be ind with initial HEP Baseline: indep with HEP Goal status:MET  2.  Pt will report at least 50% improvement in back pain Baseline:  04/22/23: 90% improvement Goal status: MET  3.  Pt will be able to transfer in/out of bed without pain Baseline:  04/22/23: "At times but not much" Goal status: MET   LONG TERM GOALS: Target date: 05/13/2023  Pt will be ind with management and progression of HEP Baseline:  Goal status: INITIAL  2.  Pt will be able to amb at least 1 mile per her personal goals s/p TKA Baseline:  Goal status: INITIAL  3.  Pt will be able to perform all transfers without pain Baseline:  Goal status: INITIAL  4.  Pt will have improved FOTO to >/=63 Baseline:  Goal status: INITIAL  5.  Pt will have 5x STS of </=13 sec to demo increased functional LE strength Baseline:   04/22/23: 17.91 sec Goal status: INITIAL   PLAN:  PT FREQUENCY: 2x/week  PT DURATION: 8 weeks  PLANNED INTERVENTIONS: Therapeutic exercises, Therapeutic activity, Neuromuscular re-education, Balance training, Gait training, Patient/Family education, Self Care, Joint mobilization, Stair training, Dry Needling, Electrical stimulation, Spinal mobilization, Cryotherapy, Moist heat, Taping, Ionotophoresis 4mg /ml Dexamethasone, Manual therapy, and Re-evaluation.  PLAN FOR NEXT SESSION: Continue core and midback strengthening. Manual work/TPDN for thoracic spine.  Kailena Lubas April Ma L Ahava Kissoon, PT 04/24/2023, 11:50 AM

## 2023-04-29 ENCOUNTER — Ambulatory Visit: Payer: Medicare Other

## 2023-04-29 DIAGNOSIS — R293 Abnormal posture: Secondary | ICD-10-CM | POA: Diagnosis not present

## 2023-04-29 DIAGNOSIS — Z483 Aftercare following surgery for neoplasm: Secondary | ICD-10-CM

## 2023-04-29 DIAGNOSIS — M546 Pain in thoracic spine: Secondary | ICD-10-CM | POA: Diagnosis not present

## 2023-04-29 DIAGNOSIS — M62830 Muscle spasm of back: Secondary | ICD-10-CM

## 2023-04-29 DIAGNOSIS — M6281 Muscle weakness (generalized): Secondary | ICD-10-CM

## 2023-04-29 DIAGNOSIS — R262 Difficulty in walking, not elsewhere classified: Secondary | ICD-10-CM | POA: Diagnosis not present

## 2023-04-29 NOTE — Therapy (Signed)
OUTPATIENT PHYSICAL THERAPY THORACOLUMBAR TREATMENT  Patient Name: Cindy [name] MRN: [id number] DOB:[birth year], 77 y.o., female Today's Date: 04/29/2023  END OF SESSION:  PT End of Session - 04/29/23 1110     Visit Number 11    Number of Visits 16    Date for PT Re-Evaluation 05/13/23    Authorization Type Medicare    PT Start Time 1107    PT Stop Time 1153    PT Time Calculation (min) 46 min    Activity Tolerance Patient tolerated treatment well    Behavior During Therapy [facility] for tasks assessed/performed            Past Medical History:  Diagnosis Date   Abnormal mammogram    Allergic rhinitis 10/30/2018   Allergy    Tetracycoline. Sulfa   Anxiety in acute stress reaction 10/30/2018   Arthritis    Atypical chest pain 10/30/2018   Cancer [facility])    breast    Cataract    Both eyes surgery   Chest pain    "not cardiac related"   Colon polyps    Dyspnea    DOE   Family history of breast cancer 07/05/2020   Family history of pancreatic cancer 07/05/2020   Family history of prostate cancer 07/05/2020   Genetic testing 07/11/2020   GERD (gastroesophageal reflux disease) 10/30/2018   History of kidney stones    passed   Hypertension 10/30/2018   Hyperthyroidism    Hypothyroidism 10/30/2018   Migraine 10/30/2018   Osteoporosis 10/30/2018   Personal history of chemotherapy    Personal history of radiation therapy    Pneumonia    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   Vitamin D deficiency 10/30/2018   Past Surgical History:  Procedure Laterality Date   ABDOMINAL HYSTERECTOMY     BREAST BIOPSY Left 06/26/2020   x2   BREAST LUMPECTOMY Left 08/15/2020   BREAST LUMPECTOMY WITH RADIOACTIVE SEED AND SENTINEL LYMPH NODE BIOPSY Left 08/15/2020   Procedure: LEFT BREAST LUMPECTOMY WITH RADIOACTIVE SEED AND SENTINEL LYMPH NODE BIOPSY;  Surgeon: Almond Lint, MD;  Location: [facility] OR;  Service: General;  Laterality: Left;  RNFA   COLONOSCOPY W/ POLYPECTOMY      ESOPHAGEAL DILATION     EYE SURGERY Bilateral    catheter with lens   HYSTERECTOMY ABDOMINAL WITH SALPINGECTOMY     KNEE SURGERY Right 09/13/2022   PORT-A-CATH REMOVAL  01/04/2021   Procedure: REMOVAL PORT-A-CATH;  Surgeon: Almond Lint, MD;  Location: [facility] [place];  Service: General;;   PORTACATH PLACEMENT Right 08/15/2020   Procedure: INSERTION PORT-A-CATH;  Surgeon: Almond Lint, MD;  Location: [facility] OR;  Service: General;  Laterality: Right;   TONSILLECTOMY     TUBAL LIGATION     Patient Active Problem List   Diagnosis Date Noted   Lumbar spondylosis 03/04/2023   Port-A-Cath in place 09/12/2020   Genetic testing 07/11/2020   Family history of breast cancer 07/05/2020   Family history of prostate cancer 07/05/2020   Malignant neoplasm of upper-outer quadrant of left breast in female, estrogen receptor negative (HCC) 06/30/2020   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/23/2019   Primary osteoarthritis of both hands 06/23/2019   Trigger thumb of left hand 06/23/2019   Polyarthralgia 06/23/2019   Hypertension 10/30/2018   Atypical chest pain 10/30/2018   Migraine 10/30/2018   Hypothyroidism 10/30/2018   Osteoporosis 10/30/2018   GERD (gastroesophageal reflux disease) 10/30/2018   Anxiety in acute stress reaction 10/30/2018   Allergic rhinitis 10/30/2018   Vitamin  D deficiency 10/30/2018    PCP: Christen Butter, NP REFERRING PROVIDER: Monica Becton REFERRING DIAG: M54.50 (ICD-10-CM) - Acute bilateral low back pain without sciatica  Rationale for Evaluation and Treatment: Rehabilitation  THERAPY DIAG:  Abnormal posture  Muscle spasm of back  Pain in thoracic spine  Muscle weakness (generalized)  Difficulty in walking, not elsewhere classified  Aftercare following surgery for neoplasm  ONSET DATE: ~1 month  SUBJECTIVE:                                                                                                                                                                                            SUBJECTIVE STATEMENT: Patient reports she is feeling sore today on   PERTINENT HISTORY:  January 2024 R TKA, osteoporosis  PAIN:  Are you having pain? Yes: NPRS scale: 2/10 Pain location: Center lower midback Pain description: "almost like a spasm or catches" Aggravating factors: trying to get up (off bed primarily), sit<>stand, walking Relieving factors: heating pad/ice   PRECAUTIONS: None  WEIGHT BEARING RESTRICTIONS: No  FALLS:  Has patient fallen in last 6 months? No  LIVING ENVIRONMENT: Lives with: lives with their family Lives in: House/apartment Stairs: Yes: External: 3 steps; none Has following equipment at home: None  OCCUPATION: Paint and work in the yard  PATIENT GOALS: Wants to return to painting and working in the yard and then get to walking  OBJECTIVE:  (Measures in this section from initial evaluation unless otherwise noted) DIAGNOSTIC FINDINGS:  03/03/23 Thoracic Spine x-ray IMPRESSION: 1. Moderate wedge-shaped compression deformity within the midthoracic spine, approximately T8, age indeterminate. 2. Severely exaggerated thoracic kyphosis.   03/03/23 Lumbar spine x-ray IMPRESSION:  1. Mild superior endplate compression deformity at L2, age  indeterminate.  2. Cortical irregularity at the anterosuperior corner of the L4  vertebral body, age indeterminate.   03/15/23 Lumbar MRI IMPRESSION: 1. No acute findings or clear explanation for the patient's symptoms. 2. Mild chronic superior endplate compression deformity at L2. No acute osseous findings. 3. Mild multilevel spondylosis as described. There is mild multifactorial spinal stenosis and lateral recess narrowing bilaterally at L3-4.   PATIENT SURVEYS:  FOTO 48; predicted 78   SENSATION: WFL except decreased sensation R lateral calf since knee replacement   MUSCLE LENGTH: Hamstrings: WNL Thomas test: Did not assess  POSTURE: rounded  shoulders, increased lumbar lordosis, and increased thoracic kyphosis  PALPATION: L>R thoracic paraspinal tightness and trigger points No overt tenderness with UPAs and CPAs  LUMBAR ROM:  AROM eval  Flexion 100%  Extension 80%  Right lateral flexion 80%  Left lateral flexion 100%  Right rotation 100%  Left rotation 100%   (Blank rows = not tested)  LOWER EXTREMITY ROM:    Active  Right eval Left eval  Hip flexion    Hip extension    Hip abduction    Hip adduction    Hip internal rotation    Hip external rotation    Knee flexion    Knee extension    Ankle dorsiflexion    Ankle plantarflexion    Ankle inversion    Ankle eversion     (Blank rows = not tested)  LOWER EXTREMITY MMT:   MMT Right eval Left eval  Hip flexion 3+ 4  Hip extension 3+ 4-  Hip abduction 3+ 3+  Hip adduction    Hip internal rotation    Hip external rotation    Knee flexion 4- 4+  Knee extension 4- 4+  Ankle dorsiflexion    Ankle plantarflexion    Ankle inversion    Ankle eversion     (Blank rows = not tested)  UPPER EXTREMITY MMT: MMT Right eval Left eval  Shoulder flexion 5 5  Shoulder extension 4- 4-  Shoulder abduction 4+ 4+  Shoulder adduction    Shoulder internal rotation 5 5  Shoulder external rotation 3+ 4-  Middle trapezius 3 3  Lower trapezius    Elbow flexion    Elbow extension    Wrist flexion    Wrist extension    Wrist ulnar deviation    Wrist radial deviation    Wrist pronation    Wrist supination    Grip strength     (Blank rows = not tested)  FUNCTIONAL TESTS:  5 times sit to stand: 17.91 sec 04/22/23  GAIT: WNL  [facility] Adult PT Treatment:                                                DATE: 04/29/2023 Therapeutic Exercise: NuStep L6-5 (brief break needed d/t SOB) x Seated pball 3 way roll outs x10 Thred the needle stretch x5 (B) Lunge knee tap downs on airex --> bosu bubble Resisted 3-way hip step out RTB crossed at ankles (needed  cueing) NuStep L5 x 5 min Therapeutic Activity: FOTO Updated status on LTG    [facility] Adult PT Treatment:                                                DATE: 04/24/23 Therapeutic Exercise: Seated pball 3 way roll outs x10 Seated thoracic rotation + extension x10 Mat table to half kneel on airex x10 Half kneeling on airex 2x10 sec Half kneeling hands forward/backward x8 Nu-step interval training:  L5 warm up x 2 minutes, L5 at 90 SPM x 1 min, 2 min at comfortable pace in between for a total of 15 min   [facility] Adult PT Treatment:                                                DATE: 04/22/23 Therapeutic Exercise: Nu-step interval training:  L5 warm up x 2 minutes, L5 at 90 SPM x 1 min, 2 min at  comfortable pace in between for a total of 10 min Standing hip clock reach with wash rag 2x10 Sit<>stand with red TB around knees 2x10 Eccentric sit<>stand red TB around knees x5 Shoulder ext green TB with marching 2x10 "W" green TB with backwards lunge 2x10 Antitrunk rotation side step green TB x10   PATIENT EDUCATION:  Education details: Exam findings, POC, initial HEP Person educated: Patient Education method: Explanation, Demonstration, and Handouts Education comprehension: verbalized understanding, returned demonstration, and needs further education  HOME EXERCISE PROGRAM: Access Code: [email or web address] URL: [email or web address] Date: 03/18/2023 Prepared by: Vernon Prey April Kirstie Peri  Exercises - Sidelying Open Book Thoracic Lumbar Rotation and Extension  - 1 x daily - 7 x weekly - 1 sets - 10 reps - 3 sec hold - Supine Lower Trunk Rotation  - 1 x daily - 7 x weekly - 1 sets - 10 reps - 3 sec hold - Supine Piriformis Stretch with Foot on Ground  - 1 x daily - 7 x weekly - 1 sets - 30 sec hold - Supine Double Knee to Chest  - 1 x daily - 7 x weekly - 1 sets - 30 sec hold - Standing Paraspinals Mobilization with Small Ball on Wall  - 1 x daily - 7 x weekly - 1 sets - 30 sec  hold  ASSESSMENT:  CLINICAL IMPRESSION: Long term goals updated to patient's current status; patient continues to have low endurance and is progressing towards personal goal of walking 1 mile (is able to tolerate 0.4 mile). Kneeling exercises incorporated to progress weight bearing tolerance on R knee. Cueing needed for movement coordination and alignment with resisted hip step outs.  GOALS: Goals reviewed with patient? Yes  SHORT TERM GOALS: Target date: 04/15/2023  Pt will be ind with initial HEP Baseline: indep with HEP Goal status:MET  2.  Pt will report at least 50% improvement in back pain Baseline:  04/22/23: 90% improvement Goal status: MET  3.  Pt will be able to transfer in/out of bed without pain Baseline:  04/22/23: "At times but not much" Goal status: MET   LONG TERM GOALS: Target date: 05/13/2023  Pt will be ind with management and progression of HEP Baseline:  Goal status: MET  2.  Pt will be able to amb at least 1 mile per her personal goals s/p TKA Baseline: 0.4 mile (low endurance) Goal status: IN PROGRESS  3.  Pt will be able to perform all transfers without pain Baseline:  Goal status: MET  4.  Pt will have improved FOTO to >/=63 Baseline: 55 Goal status: IN PROGRESS  5.  Pt will have 5x STS of </=13 sec to demo increased functional LE strength Baseline:  04/22/23: 14 sec Goal status: INITIAL   PLAN:  PT FREQUENCY: 2x/week  PT DURATION: 8 weeks  PLANNED INTERVENTIONS: Therapeutic exercises, Therapeutic activity, Neuromuscular re-education, Balance training, Gait training, Patient/Family education, Self Care, Joint mobilization, Stair training, Dry Needling, Electrical stimulation, Spinal mobilization, Cryotherapy, Moist heat, Taping, Ionotophoresis 4mg /ml Dexamethasone, Manual therapy, and Re-evaluation.  PLAN FOR NEXT SESSION: NuStep endurance training. Continue core and midback strengthening. Manual work/TPDN for thoracic spine.  Sanjuana Mae, PTA 04/29/2023, 11:53 AM

## 2023-05-01 ENCOUNTER — Ambulatory Visit: Payer: Medicare Other

## 2023-05-01 DIAGNOSIS — R262 Difficulty in walking, not elsewhere classified: Secondary | ICD-10-CM

## 2023-05-01 DIAGNOSIS — M6281 Muscle weakness (generalized): Secondary | ICD-10-CM | POA: Diagnosis not present

## 2023-05-01 DIAGNOSIS — R293 Abnormal posture: Secondary | ICD-10-CM | POA: Diagnosis not present

## 2023-05-01 DIAGNOSIS — M62830 Muscle spasm of back: Secondary | ICD-10-CM | POA: Diagnosis not present

## 2023-05-01 DIAGNOSIS — Z483 Aftercare following surgery for neoplasm: Secondary | ICD-10-CM | POA: Diagnosis not present

## 2023-05-01 DIAGNOSIS — M546 Pain in thoracic spine: Secondary | ICD-10-CM

## 2023-05-01 NOTE — Therapy (Addendum)
OUTPATIENT PHYSICAL THERAPY THORACOLUMBAR TREATMENT AND DISCHARGE  PHYSICAL THERAPY DISCHARGE SUMMARY  Visits from Start of Care: 12  Current functional level related to goals / functional outcomes: See below   Remaining deficits: At this point, pt with well maintained pain in her thoracolumbar spine. PT had been working on improving her endurance for postural stability and cardiovascular system   Education / Equipment: See below   Patient agrees to discharge. Patient goals were partially met. Patient is being discharged due to not returning since the last visit.   Patient Name: Cindy [name] MRN: [id number] DOB:1946-05-03, 77 y.o., female Today's Date: 05/01/2023  END OF SESSION:  PT End of Session - 05/01/23 1012     Visit Number 12    Number of Visits 16    Date for PT Re-Evaluation 05/13/23    Authorization Type Medicare    PT Start Time 1013    PT Stop Time 1058    PT Time Calculation (min) 45 min    Activity Tolerance Patient tolerated treatment well    Behavior During Therapy [facility] for tasks assessed/performed            Past Medical History:  Diagnosis Date   Abnormal mammogram    Allergic rhinitis 10/30/2018   Allergy    Tetracycoline. Sulfa   Anxiety in acute stress reaction 10/30/2018   Arthritis    Atypical chest pain 10/30/2018   Cancer [facility])    breast    Cataract    Both eyes surgery   Chest pain    "not cardiac related"   Colon polyps    Dyspnea    DOE   Family history of breast cancer 07/05/2020   Family history of pancreatic cancer 07/05/2020   Family history of prostate cancer 07/05/2020   Genetic testing 07/11/2020   GERD (gastroesophageal reflux disease) 10/30/2018   History of kidney stones    passed   Hypertension 10/30/2018   Hyperthyroidism    Hypothyroidism 10/30/2018   Migraine 10/30/2018   Osteoporosis 10/30/2018   Personal history of chemotherapy    Personal history of radiation therapy    Pneumonia     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   Vitamin D deficiency 10/30/2018   Past Surgical History:  Procedure Laterality Date   ABDOMINAL HYSTERECTOMY     BREAST BIOPSY Left 06/26/2020   x2   BREAST LUMPECTOMY Left 08/15/2020   BREAST LUMPECTOMY WITH RADIOACTIVE SEED AND SENTINEL LYMPH NODE BIOPSY Left 08/15/2020   Procedure: LEFT BREAST LUMPECTOMY WITH RADIOACTIVE SEED AND SENTINEL LYMPH NODE BIOPSY;  Surgeon: Almond Lint, MD;  Location: [facility] OR;  Service: General;  Laterality: Left;  RNFA   COLONOSCOPY W/ POLYPECTOMY     ESOPHAGEAL DILATION     EYE SURGERY Bilateral    catheter with lens   HYSTERECTOMY ABDOMINAL WITH SALPINGECTOMY     KNEE SURGERY Right 09/13/2022   PORT-A-CATH REMOVAL  01/04/2021   Procedure: REMOVAL PORT-A-CATH;  Surgeon: Almond Lint, MD;  Location: [facility] [place];  Service: General;;   PORTACATH PLACEMENT Right 08/15/2020   Procedure: INSERTION PORT-A-CATH;  Surgeon: Almond Lint, MD;  Location: [facility] OR;  Service: General;  Laterality: Right;   TONSILLECTOMY     TUBAL LIGATION     Patient Active Problem List   Diagnosis Date Noted   Lumbar spondylosis 03/04/2023   Port-A-Cath in place 09/12/2020   Genetic testing 07/11/2020   Family history of breast cancer 07/05/2020   Family history of prostate cancer 07/05/2020   Malignant  neoplasm of upper-outer quadrant of left breast in female, estrogen receptor negative (HCC) 06/30/2020   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/23/2019   Primary osteoarthritis of both hands 06/23/2019   Trigger thumb of left hand 06/23/2019   Polyarthralgia 06/23/2019   Hypertension 10/30/2018   Atypical chest pain 10/30/2018   Migraine 10/30/2018   Hypothyroidism 10/30/2018   Osteoporosis 10/30/2018   GERD (gastroesophageal reflux disease) 10/30/2018   Anxiety in acute stress reaction 10/30/2018   Allergic rhinitis 10/30/2018   Vitamin D deficiency 10/30/2018    PCP: Christen Butter, NP REFERRING PROVIDER:  Monica Becton,* REFERRING DIAG: M54.50 (ICD-10-CM) - Acute bilateral low back pain without sciatica  Rationale for Evaluation and Treatment: Rehabilitation  THERAPY DIAG:  Abnormal posture  Muscle spasm of back  Pain in thoracic spine  Muscle weakness (generalized)  Difficulty in walking, not elsewhere classified  ONSET DATE: ~1 month  SUBJECTIVE:                                                                                                                                                                                           SUBJECTIVE STATEMENT: Patient reports she was dizzy yesterday, thinks it maybe she did not hydrate adequately. Patient reports no back pain today.   PERTINENT HISTORY:  January 2024 R TKA, osteoporosis  PAIN:  Are you having pain? Yes: NPRS scale: 2/10 Pain location: Center lower midback Pain description: "almost like a spasm or catches" Aggravating factors: trying to get up (off bed primarily), sit<>stand, walking Relieving factors: heating pad/ice   PRECAUTIONS: None  WEIGHT BEARING RESTRICTIONS: No  FALLS:  Has patient fallen in last 6 months? No  LIVING ENVIRONMENT: Lives with: lives with their family Lives in: House/apartment Stairs: Yes: External: 3 steps; none Has following equipment at home: None  OCCUPATION: Paint and work in the yard  PATIENT GOALS: Wants to return to painting and working in the yard and then get to walking  OBJECTIVE:  (Measures in this section from initial evaluation unless otherwise noted) DIAGNOSTIC FINDINGS:  03/03/23 Thoracic Spine x-ray IMPRESSION: 1. Moderate wedge-shaped compression deformity within the midthoracic spine, approximately T8, age indeterminate. 2. Severely exaggerated thoracic kyphosis.   03/03/23 Lumbar spine x-ray IMPRESSION:  1. Mild superior endplate compression deformity at L2, age  indeterminate.  2. Cortical irregularity at the anterosuperior corner of the L4   vertebral body, age indeterminate.   03/15/23 Lumbar MRI IMPRESSION: 1. No acute findings or clear explanation for the patient's symptoms. 2. Mild chronic superior endplate compression deformity at L2. No acute osseous findings. 3. Mild multilevel spondylosis as described. There is mild multifactorial spinal stenosis  and lateral recess narrowing bilaterally at L3-4.   PATIENT SURVEYS:  FOTO 48; predicted 62   SENSATION: WFL except decreased sensation R lateral calf since knee replacement   MUSCLE LENGTH: Hamstrings: WNL Thomas test: Did not assess  POSTURE: rounded shoulders, increased lumbar lordosis, and increased thoracic kyphosis  PALPATION: L>R thoracic paraspinal tightness and trigger points No overt tenderness with UPAs and CPAs  LUMBAR ROM:  AROM eval  Flexion 100%  Extension 80%  Right lateral flexion 80%  Left lateral flexion 100%  Right rotation 100%  Left rotation 100%   (Blank rows = not tested)  LOWER EXTREMITY ROM:    Active  Right eval Left eval  Hip flexion    Hip extension    Hip abduction    Hip adduction    Hip internal rotation    Hip external rotation    Knee flexion    Knee extension    Ankle dorsiflexion    Ankle plantarflexion    Ankle inversion    Ankle eversion     (Blank rows = not tested)  LOWER EXTREMITY MMT:   MMT Right eval Left eval  Hip flexion 3+ 4  Hip extension 3+ 4-  Hip abduction 3+ 3+  Hip adduction    Hip internal rotation    Hip external rotation    Knee flexion 4- 4+  Knee extension 4- 4+  Ankle dorsiflexion    Ankle plantarflexion    Ankle inversion    Ankle eversion     (Blank rows = not tested)  UPPER EXTREMITY MMT: MMT Right eval Left eval  Shoulder flexion 5 5  Shoulder extension 4- 4-  Shoulder abduction 4+ 4+  Shoulder adduction    Shoulder internal rotation 5 5  Shoulder external rotation 3+ 4-  Middle trapezius 3 3  Lower trapezius    Elbow flexion    Elbow extension    Wrist  flexion    Wrist extension    Wrist ulnar deviation    Wrist radial deviation    Wrist pronation    Wrist supination    Grip strength     (Blank rows = not tested)  FUNCTIONAL TESTS:  5 times sit to stand: 17.91 sec 04/22/23  GAIT: WNL  [facility] Adult PT Treatment:                                                DATE: 05/01/2023 Therapeutic Exercise: NuStep endurance training:  L6 x L4 <--> L7  interval training, changing every 30 sec x L4 x Seated dead lift 10#KB x15 (Airex) STS + RTB 3x10 (last set on corner of table) Seated on green PB, holding 4#MB: Overhead raises  Wood chops x 10 (B) Paloff press GTB (double handle hold) --> added perturbations Marching Seated green PB roll out front & diagonals Shoulder ext GTB with marching 2x10 Lunge step back + iso row GTB x10 Iso row GTB + back toe tap    [facility] Adult PT Treatment:                                                DATE: 04/29/2023 Therapeutic Exercise: NuStep L6-5 (brief break needed d/t SOB) x  Seated pball 3 way roll outs x10 Thred the needle stretch x5 (B) Lunge knee tap downs on airex --> bosu bubble Resisted 3-way hip step out RTB crossed at ankles (needed cueing) NuStep L5 x 5 min Therapeutic Activity: FOTO Updated status on LTG    [facility] Adult PT Treatment:                                                DATE: 04/24/23 Therapeutic Exercise: Seated pball 3 way roll outs x10 Seated thoracic rotation + extension x10 Mat table to half kneel on airex x10 Half kneeling on airex 2x10 sec Half kneeling hands forward/backward x8 Nu-step interval training:  L5 warm up x 2 minutes, L5 at 90 SPM x 1 min, 2 min at comfortable pace in between for a total of 15 min   PATIENT EDUCATION:  Education details: Walking program for cardio endurance Person educated: Patient Education method: Explanation, Facilities manager, and Handouts Education comprehension: verbalized understanding, returned demonstration,  and needs further education  HOME EXERCISE PROGRAM: Access Code: [email or web address] URL: [email or web address] Date: 03/18/2023 Prepared by: Vernon Prey April Kirstie Peri  Exercises - Sidelying Open Book Thoracic Lumbar Rotation and Extension  - 1 x daily - 7 x weekly - 1 sets - 10 reps - 3 sec hold - Supine Lower Trunk Rotation  - 1 x daily - 7 x weekly - 1 sets - 10 reps - 3 sec hold - Supine Piriformis Stretch with Foot on Ground  - 1 x daily - 7 x weekly - 1 sets - 30 sec hold - Supine Double Knee to Chest  - 1 x daily - 7 x weekly - 1 sets - 30 sec hold - Standing Paraspinals Mobilization with Small Ball on Wall  - 1 x daily - 7 x weekly - 1 sets - 30 sec hold  ASSESSMENT:  CLINICAL IMPRESSION: Cardio endurance training continued on NuStep, incorporating resistance interval trianing; patient exhibited SOB and resistance adjusted accordingly to address symptoms and allow for longer endurance tolerance to exercise. Core and postural stability challenged by sitting on physioball during exercises. Discussion with patient on starting walking program to address cardio endurance and monitoring SOB symptoms. Will incorporate diaphragmatic and ribcage breathing activities to address SOB and breathing mechanics.    GOALS: Goals reviewed with patient? Yes  SHORT TERM GOALS: Target date: 04/15/2023  Pt will be ind with initial HEP Baseline: indep with HEP Goal status:MET  2.  Pt will report at least 50% improvement in back pain Baseline:  04/22/23: 90% improvement Goal status: MET  3.  Pt will be able to transfer in/out of bed without pain Baseline:  04/22/23: "At times but not much" Goal status: MET   LONG TERM GOALS: Target date: 05/13/2023  Pt will be ind with management and progression of HEP Baseline:  Goal status: MET  2.  Pt will be able to amb at least 1 mile per her personal goals s/p TKA Baseline: 0.4 mile (low endurance) Goal status: IN PROGRESS  3.  Pt will be able  to perform all transfers without pain Baseline:  Goal status: MET  4.  Pt will have improved FOTO to >/=63 Baseline: 55 Goal status: IN PROGRESS  5.  Pt will have 5x STS of </=13 sec to demo increased functional LE strength Baseline:  04/22/23: 14 sec Goal status: INITIAL  PLAN:  PT FREQUENCY: 2x/week  PT DURATION: 8 weeks  PLANNED INTERVENTIONS: Therapeutic exercises, Therapeutic activity, Neuromuscular re-education, Balance training, Gait training, Patient/Family education, Self Care, Joint mobilization, Stair training, Dry Needling, Electrical stimulation, Spinal mobilization, Cryotherapy, Moist heat, Taping, Ionotophoresis 4mg /ml Dexamethasone, Manual therapy, and Re-evaluation.  PLAN FOR NEXT SESSION: Ribcage/diaphragmatic breathing focus next visit. NuStep endurance training. Continue core and midback strengthening. Manual work/TPDN for thoracic spine.  Sanjuana Mae, PTA 05/01/2023, 11:04 AM

## 2023-05-06 ENCOUNTER — Ambulatory Visit: Payer: Medicare Other

## 2023-05-07 ENCOUNTER — Other Ambulatory Visit: Payer: Self-pay | Admitting: Medical-Surgical

## 2023-05-07 DIAGNOSIS — F411 Generalized anxiety disorder: Secondary | ICD-10-CM

## 2023-05-07 NOTE — Telephone Encounter (Signed)
Spoke with patient. Scheduled a follow up visit with Christen Butter, NP tomorrow.

## 2023-05-07 NOTE — Progress Notes (Deleted)
HPI: Follow-up chest pain. Patient previously resided in [place] and moved here in 2019 to be close to family.  She has had occasional chest pain intermittently for approximately 20 years.  Coronary CTA September 2021 showed calcium score 0, no coronary disease and spotty aortic atherosclerosis. Echocardiogram April 2024 showed normal LV function, grade 1 diastolic dysfunction. Since last seen   Current Outpatient Medications  Medication Sig Dispense Refill   acetaminophen (TYLENOL) 500 MG tablet Take 1,000 mg by mouth every 6 (six) hours as needed for moderate pain.     albuterol (VENTOLIN HFA) 108 (90 Base) MCG/ACT inhaler Inhale 2 puffs into the lungs every 6 (six) hours as needed for wheezing. 2 each 11   aspirin EC (BAYER ASPIRIN) 325 MG tablet TAKE 1 TABLET BY MOUTH TWICE DAILY WITH FOOD TO DECREASE RISK OF BLOOD CLOTS. TAKE 1 MONTH POST OP     baclofen (LIORESAL) 10 MG tablet Take 1 tablet (10 mg total) by mouth daily as needed for muscle spasms. 21 each 0   Calcium-Vitamin D-Vitamin K 500-100-40 MG-UNT-MCG CHEW Chew 2 tablets by mouth daily.     celecoxib (CELEBREX) 200 MG capsule Take by mouth.     cetirizine (ZYRTEC) 10 MG tablet Take 1 tablet (10 mg total) by mouth daily as needed for allergies. 90 tablet 1   denosumab (PROLIA) 60 MG/ML SOSY injection Inject 60 mg into the skin every 6 (six) months.     dorzolamide-timolol (COSOPT) 2-0.5 % ophthalmic solution Place 1 drop into both eyes 2 (two) times daily.     escitalopram (LEXAPRO) 5 MG tablet Take 1 tablet by mouth once daily 90 tablet 0   esomeprazole (NEXIUM) 40 MG capsule Take 1 capsule by mouth once daily 90 capsule 0   fluticasone (FLONASE) 50 MCG/ACT nasal spray Place 2 sprays into both nostrils daily. 1 g 2   hydrochlorothiazide (HYDRODIURIL) 25 MG tablet TAKE 1 TABLET BY MOUTH ONCE DAILY AS NEEDED SWELLING 90 tablet 1   latanoprost (XALATAN) 0.005 % ophthalmic solution      levothyroxine (SYNTHROID) 75 MCG tablet  Take 1 tablet (75 mcg total) by mouth daily before breakfast. 90 tablet 1   NIFEdipine (ADALAT CC) 30 MG 24 hr tablet TAKE 1 TABLET BY MOUTH ONCE DAILY . APPOINTMENT REQUIRED FOR FUTURE REFILLS 30 tablet 0   nitroGLYCERIN (NITROSTAT) 0.4 MG SL tablet Place 1 tablet (0.4 mg total) under the tongue every 5 (five) minutes as needed for chest pain. 20 tablet 1   predniSONE (DELTASONE) 50 MG tablet One tab PO daily for 5 days. 5 tablet 0   Probiotic Product (PROBIOTIC PO) Take 1 capsule by mouth daily.     rosuvastatin (CRESTOR) 20 MG tablet Take 1 tablet (20 mg total) by mouth daily. 90 tablet 3   SUMAtriptan (IMITREX) 100 MG tablet Take 0.5-1 tablets (50-100 mg total) by mouth every 2 (two) hours as needed for migraine (Max 2 pills per 24 hours). 10 tablet 3   traMADol (ULTRAM) 50 MG tablet Take 1 tablet (50 mg total) by mouth every 8 (eight) hours as needed for moderate pain. 90 tablet 0   vitamin B-12 (CYANOCOBALAMIN) 500 MCG tablet Take 500 mcg by mouth daily.      No current facility-administered medications for this visit.     Past Medical History:  Diagnosis Date   Abnormal mammogram    Allergic rhinitis 10/30/2018   Allergy    Tetracycoline. Sulfa   Anxiety in acute stress reaction  10/30/2018   Arthritis    Atypical chest pain 10/30/2018   Cancer [facility])    breast    Cataract    Both eyes surgery   Chest pain    "not cardiac related"   Colon polyps    Dyspnea    DOE   Family history of breast cancer 07/05/2020   Family history of pancreatic cancer 07/05/2020   Family history of prostate cancer 07/05/2020   Genetic testing 07/11/2020   GERD (gastroesophageal reflux disease) 10/30/2018   History of kidney stones    passed   Hypertension 10/30/2018   Hyperthyroidism    Hypothyroidism 10/30/2018   Migraine 10/30/2018   Osteoporosis 10/30/2018   Personal history of chemotherapy    Personal history of radiation therapy    Pneumonia    Postmenopausal atrophic vaginitis  10/30/2018   Vitamin D deficiency 10/30/2018    Past Surgical History:  Procedure Laterality Date   ABDOMINAL HYSTERECTOMY     BREAST BIOPSY Left 06/26/2020   x2   BREAST LUMPECTOMY Left 08/15/2020   BREAST LUMPECTOMY WITH RADIOACTIVE SEED AND SENTINEL LYMPH NODE BIOPSY Left 08/15/2020   Procedure: LEFT BREAST LUMPECTOMY WITH RADIOACTIVE SEED AND SENTINEL LYMPH NODE BIOPSY;  Surgeon: Almond Lint, MD;  Location: [facility] OR;  Service: General;  Laterality: Left;  RNFA   COLONOSCOPY W/ POLYPECTOMY     ESOPHAGEAL DILATION     EYE SURGERY Bilateral    catheter with lens   HYSTERECTOMY ABDOMINAL WITH SALPINGECTOMY     KNEE SURGERY Right 09/13/2022   PORT-A-CATH REMOVAL  01/04/2021   Procedure: REMOVAL PORT-A-CATH;  Surgeon: Almond Lint, MD;  Location: [facility] [place];  Service: General;;   PORTACATH PLACEMENT Right 08/15/2020   Procedure: INSERTION PORT-A-CATH;  Surgeon: Almond Lint, MD;  Location: [facility] OR;  Service: General;  Laterality: Right;   TONSILLECTOMY     TUBAL LIGATION      Social History   Socioeconomic History   Marital status: Married    Spouse name: Staci Righter "Dwayne"   Number of children: 2   Years of education: 15   Highest education level: Some college, no degree  Occupational History   Occupation: Retired.  Tobacco Use   Smoking status: Never   Smokeless tobacco: Never  Vaping Use   Vaping status: Never Used  Substance and Sexual Activity   Alcohol use: Not Currently    Comment: rarely   Drug use: Never   Sexual activity: Not Currently    Partners: Male    Birth control/protection: None  Other Topics Concern   Not on file  Social History Narrative   Lives with her husband. She enjoys water colors and gardening.   Social Determinants of Health   Financial Resource Strain: Low Risk  (02/28/2023)   Overall Financial Resource Strain (CARDIA)    Difficulty of Paying Living Expenses: Not hard at all  Food Insecurity: No Food Insecurity  (02/28/2023)   Hunger Vital Sign    Worried About Running Out of Food in the Last Year: Never true    Ran Out of Food in the Last Year: Never true  Transportation Needs: No Transportation Needs (02/28/2023)   PRAPARE - Administrator, Civil Service (Medical): No    Lack of Transportation (Non-Medical): No  Physical Activity: Insufficiently Active (02/28/2023)   Exercise Vital Sign    Days of Exercise per Week: 1 day    Minutes of Exercise per Session: 20 min  Stress: No Stress Concern Present (02/28/2023)  [employer] of Occupational Health - Occupational Stress Questionnaire    Feeling of Stress : Not at all  Social Connections: Unknown (02/28/2023)   Social Connection and Isolation Panel [NHANES]    Frequency of Communication with Friends and Family: Twice a week    Frequency of Social Gatherings with Friends and Family: Twice a week    Attends Religious Services: Patient declined    Database administrator or Organizations: No    Attends Banker Meetings: Never    Marital Status: Married  Catering manager Violence: Not At Risk (09/20/2022)   Humiliation, Afraid, Rape, and Kick questionnaire    Fear of Current or Ex-Partner: No    Emotionally Abused: No    Physically Abused: No    Sexually Abused: No    Family History  Problem Relation Age of Onset   High blood pressure Mother    Breast cancer Mother        dx before 80   Alzheimer's disease Mother    Cancer Mother    Heart attack Father    Prostate cancer Brother 58   Breast cancer Maternal Aunt        dx 57s   Cancer Maternal Aunt    Breast cancer Maternal Aunt        dx 12s   Breast cancer Maternal Grandmother        dx after 50   Cancer Maternal Grandmother    Leukemia Paternal Grandfather        dx 19s-80s   Pancreatic cancer Cousin        maternal; dx 30s   Cancer Maternal Aunt 50       unknown type   Breast cancer Cousin        paternal; dx 46s   Cancer Maternal Aunt     Diabetes Daughter     ROS: no fevers or chills, productive cough, hemoptysis, dysphasia, odynophagia, melena, hematochezia, dysuria, hematuria, rash, seizure activity, orthopnea, PND, pedal edema, claudication. Remaining systems are negative.  Physical Exam: Well-developed well-nourished in no acute distress.  Skin is warm and dry.  HEENT is normal.  Neck is supple.  Chest is clear to auscultation with normal expansion.  Cardiovascular exam is regular rate and rhythm.  Abdominal exam nontender or distended. No masses palpated. Extremities show no edema. neuro grossly intact  ECG- personally reviewed  A/P  1 chest pain-symptoms are longstanding and atypical.  Previous CT showed no coronary disease.  Electrocardiogram shows no new ST changes.  Will not pursue further ischemia evaluation.  2 hypertension-patient's blood pressure is controlled.  Continue present medications.  3 hyperlipidemia-continue statin.  4 history of dizzy spells-previous felt secondary to orthostasis.  Symptoms have improved since last office visit.  Continue hydration.  LV function is normal.  Olga Millers, MD

## 2023-05-08 ENCOUNTER — Encounter: Payer: Self-pay | Admitting: Medical-Surgical

## 2023-05-08 ENCOUNTER — Ambulatory Visit (INDEPENDENT_AMBULATORY_CARE_PROVIDER_SITE_OTHER): Payer: Medicare Other | Admitting: Medical-Surgical

## 2023-05-08 ENCOUNTER — Ambulatory Visit: Payer: Medicare Other

## 2023-05-08 VITALS — BP 122/82 | HR 70 | Resp 20 | Ht 64.0 in | Wt 143.0 lb

## 2023-05-08 DIAGNOSIS — I10 Essential (primary) hypertension: Secondary | ICD-10-CM

## 2023-05-08 DIAGNOSIS — F411 Generalized anxiety disorder: Secondary | ICD-10-CM | POA: Diagnosis not present

## 2023-05-08 DIAGNOSIS — F430 Acute stress reaction: Secondary | ICD-10-CM | POA: Diagnosis not present

## 2023-05-08 DIAGNOSIS — E039 Hypothyroidism, unspecified: Secondary | ICD-10-CM

## 2023-05-08 DIAGNOSIS — R748 Abnormal levels of other serum enzymes: Secondary | ICD-10-CM

## 2023-05-08 DIAGNOSIS — R0982 Postnasal drip: Secondary | ICD-10-CM

## 2023-05-08 MED ORDER — ESCITALOPRAM OXALATE 5 MG PO TABS: 5.0000 mg | ORAL_TABLET | Freq: Every day | ORAL | 0 refills | Status: DC

## 2023-05-08 MED ORDER — AZELASTINE HCL 0.1 % NA SOLN: 2.0000 | Freq: Two times a day (BID) | NASAL | 1 refills | Status: AC | PRN

## 2023-05-08 NOTE — Progress Notes (Signed)
        Established patient visit  History, exam, impression, and plan:  1. Primary hypertension Pleasant 77 year old female presenting today with a history of hypertension currently managed by cardiology.  Compliant with medications.  Does not check her blood pressure at home regularly.  Following a low-sodium diet and staying active as tolerated although her activity tolerance has been quite reduced.  Blood pressure looks good today.  Follow-up with cardiology as recommended.  2. Anxiety in acute stress reaction Taking Lexapro 5 mg daily, tolerating well without side effects.  Has been stable on this regimen for quite a while and feels that the medication still works for her.  Denies SI/HI.  Continue Lexapro 5 mg daily. - escitalopram (LEXAPRO) 5 MG tablet; Take 1 tablet (5 mg total) by mouth daily.  Dispense: 90 tablet; Refill: 0  3. Elevated alkaline phosphatase level Recent labs demonstrated elevation of the alkaline phosphatase level.  This was rechecked during appointment with Dr. Karie Schwalbe recently.  Her levels improved a little but are still somewhat elevated.  Checking gamma GT to determine hepatobiliary versus bone etiology. - Gamma GT  4. Hypothyroidism, unspecified type Taking levothyroxine 75 mcg daily, tolerating well without side effects.  Will be due for recheck of her levels soon so adding this to the blood work today.  No current concerning symptoms.  Continue levothyroxine as prescribed. - TSH  5. PND (post-nasal drip) Reports that she has chronic allergic rhinitis manifested with profuse rhinorrhea, postnasal drip, and a feeling of mucus buildup in her throat.  Taking Zyrtec and using Flonase however this does not seem to help.  Has to prop her head up at night to reduce the drainage.  Adding azelastine 2 sprays each nare twice daily as needed.  Would like her to do Flonase in the morning and azelastine at night but okay to increase to twice daily depending on symptoms and  response.   Procedures performed this visit: None.  Return in about 6 months (around 11/07/2023) for chronic disease follow up.  __________________________________ Thayer Ohm, DNP, APRN, FNP-BC Primary Care and Sports Medicine [facility] [place]

## 2023-05-09 LAB — TSH: TSH: 3.65 u[IU]/mL (ref 0.450–4.500)

## 2023-05-09 LAB — GAMMA GT: GGT: 27 IU/L (ref 0–60)

## 2023-05-09 NOTE — Addendum Note (Signed)
Addended byChristen Butter on: 05/09/2023 09:11 AM   Modules accepted: Orders

## 2023-05-13 ENCOUNTER — Encounter: Payer: Self-pay | Admitting: Physical Therapy

## 2023-05-19 ENCOUNTER — Ambulatory Visit: Payer: Medicare Other | Admitting: Cardiology

## 2023-06-18 ENCOUNTER — Other Ambulatory Visit: Payer: Self-pay | Admitting: Medical-Surgical

## 2023-06-19 DIAGNOSIS — H401133 Primary open-angle glaucoma, bilateral, severe stage: Secondary | ICD-10-CM | POA: Diagnosis not present

## 2023-06-19 DIAGNOSIS — Z961 Presence of intraocular lens: Secondary | ICD-10-CM | POA: Diagnosis not present

## 2023-06-25 ENCOUNTER — Encounter: Payer: Self-pay | Admitting: Hematology and Oncology

## 2023-06-25 NOTE — Telephone Encounter (Signed)
TC

## 2023-07-07 ENCOUNTER — Other Ambulatory Visit: Payer: Self-pay | Admitting: Medical-Surgical

## 2023-07-07 DIAGNOSIS — K219 Gastro-esophageal reflux disease without esophagitis: Secondary | ICD-10-CM

## 2023-07-07 MED ORDER — ESOMEPRAZOLE MAGNESIUM 40 MG PO CPDR
40.0000 mg | DELAYED_RELEASE_CAPSULE | Freq: Every day | ORAL | 0 refills | Status: DC
Start: 2023-07-07 — End: 2023-10-03

## 2023-07-17 NOTE — Progress Notes (Signed)
HPI: Follow-up chest pain. Patient previously resided in [place] and moved here in 2019 to be close to family.  She has had occasional chest pain intermittently for years.  Coronary CTA September 2021 showed calcium score 0, no coronary disease and spotty aortic atherosclerosis.  Echocardiogram April 2024 showed normal LV function, grade 1 diastolic dysfunction.  Since last seen she has some dyspnea on exertion but improved.  No orthopnea, PND, exertional chest pain or syncope.  Occasional pain intermittently in her chest that has been present for years.  Occasional minimal pedal edema.  Current Outpatient Medications  Medication Sig Dispense Refill   albuterol (VENTOLIN HFA) 108 (90 Base) MCG/ACT inhaler Inhale 2 puffs into the lungs every 6 (six) hours as needed for wheezing. 2 each 11   azelastine (ASTELIN) 0.1 % nasal spray Place 2 sprays into both nostrils 2 (two) times daily as needed for rhinitis. Use in each nostril as directed 30 mL 1   Calcium-Vitamin D-Vitamin K 500-100-40 MG-UNT-MCG CHEW Chew 2 tablets by mouth daily.     denosumab (PROLIA) 60 MG/ML SOSY injection Inject 60 mg into the skin every 6 (six) months.     escitalopram (LEXAPRO) 5 MG tablet Take 1 tablet (5 mg total) by mouth daily. 90 tablet 0   esomeprazole (NEXIUM) 40 MG capsule Take 1 capsule (40 mg total) by mouth daily. 90 capsule 0   hydrochlorothiazide (HYDRODIURIL) 25 MG tablet TAKE 1 TABLET BY MOUTH ONCE DAILY AS NEEDED SWELLING 90 tablet 1   latanoprost (XALATAN) 0.005 % ophthalmic solution      levothyroxine (SYNTHROID) 75 MCG tablet Take 1 tablet (75 mcg total) by mouth daily before breakfast. 90 tablet 1   NIFEdipine (ADALAT CC) 30 MG 24 hr tablet TAKE 1 TABLET BY MOUTH ONCE DAILY . APPOINTMENT REQUIRED FOR FUTURE REFILLS 90 tablet 0   nitroGLYCERIN (NITROSTAT) 0.4 MG SL tablet Place 1 tablet (0.4 mg total) under the tongue every 5 (five) minutes as needed for chest pain. 20 tablet 1   rosuvastatin  (CRESTOR) 20 MG tablet Take 1 tablet (20 mg total) by mouth daily. 90 tablet 3   SUMAtriptan (IMITREX) 100 MG tablet Take 0.5-1 tablets (50-100 mg total) by mouth every 2 (two) hours as needed for migraine (Max 2 pills per 24 hours). 10 tablet 3   traMADol (ULTRAM) 50 MG tablet Take 1 tablet (50 mg total) by mouth every 8 (eight) hours as needed for moderate pain. 90 tablet 0   vitamin B-12 (CYANOCOBALAMIN) 500 MCG tablet Take 500 mcg by mouth daily.      acetaminophen (TYLENOL) 500 MG tablet Take 1,000 mg by mouth every 6 (six) hours as needed for moderate pain. (Patient not taking: Reported on 07/30/2023)     celecoxib (CELEBREX) 200 MG capsule Take by mouth.     cetirizine (ZYRTEC) 10 MG tablet Take 1 tablet (10 mg total) by mouth daily as needed for allergies. (Patient not taking: Reported on 07/30/2023) 90 tablet 1   fluticasone (FLONASE) 50 MCG/ACT nasal spray Place 2 sprays into both nostrils daily. 1 g 2   Probiotic Product (PROBIOTIC PO) Take 1 capsule by mouth daily. (Patient not taking: Reported on 07/30/2023)     No current facility-administered medications for this visit.     Past Medical History:  Diagnosis Date   Abnormal mammogram    Allergic rhinitis 10/30/2018   Allergy    Tetracycoline. Sulfa   Anxiety in acute stress reaction 10/30/2018   Arthritis  Atypical chest pain 10/30/2018   Cancer [facility])    breast    Cataract    Both eyes surgery   Chest pain    "not cardiac related"   Colon polyps    Dyspnea    DOE   Family history of breast cancer 07/05/2020   Family history of pancreatic cancer 07/05/2020   Family history of prostate cancer 07/05/2020   Genetic testing 07/11/2020   GERD (gastroesophageal reflux disease) 10/30/2018   History of kidney stones    passed   Hypertension 10/30/2018   Hyperthyroidism    Hypothyroidism 10/30/2018   Migraine 10/30/2018   Osteoporosis 10/30/2018   Personal history of chemotherapy    Personal history of radiation  therapy    Pneumonia    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   Vitamin D deficiency 10/30/2018    Past Surgical History:  Procedure Laterality Date   ABDOMINAL HYSTERECTOMY     BREAST BIOPSY Left 06/26/2020   x2   BREAST LUMPECTOMY Left 08/15/2020   BREAST LUMPECTOMY WITH RADIOACTIVE SEED AND SENTINEL LYMPH NODE BIOPSY Left 08/15/2020   Procedure: LEFT BREAST LUMPECTOMY WITH RADIOACTIVE SEED AND SENTINEL LYMPH NODE BIOPSY;  Surgeon: Almond Lint, MD;  Location: [facility] OR;  Service: General;  Laterality: Left;  RNFA   COLONOSCOPY W/ POLYPECTOMY     ESOPHAGEAL DILATION     EYE SURGERY Bilateral    catheter with lens   HYSTERECTOMY ABDOMINAL WITH SALPINGECTOMY     KNEE SURGERY Right 09/13/2022   PORT-A-CATH REMOVAL  01/04/2021   Procedure: REMOVAL PORT-A-CATH;  Surgeon: Almond Lint, MD;  Location: [facility];  Service: General;;   PORTACATH PLACEMENT Right 08/15/2020   Procedure: INSERTION PORT-A-CATH;  Surgeon: Almond Lint, MD;  Location: [facility] OR;  Service: General;  Laterality: Right;   TONSILLECTOMY     TUBAL LIGATION      Social History   Socioeconomic History   Marital status: Married    Spouse name: Staci Righter "Dwayne"   Number of children: 2   Years of education: 15   Highest education level: Some college, no degree  Occupational History   Occupation: Retired.  Tobacco Use   Smoking status: Never   Smokeless tobacco: Never  Vaping Use   Vaping status: Never Used  Substance and Sexual Activity   Alcohol use: Not Currently    Comment: rarely   Drug use: Never   Sexual activity: Not Currently    Partners: Male    Birth control/protection: None  Other Topics Concern   Not on file  Social History Narrative   Lives with her husband. She enjoys water colors and gardening.   Social Determinants of Health   Financial Resource Strain: Low Risk  (02/28/2023)   Overall Financial Resource Strain (CARDIA)    Difficulty of Paying Living Expenses: Not  hard at all  Food Insecurity: No Food Insecurity (02/28/2023)   Hunger Vital Sign    Worried About Running Out of Food in the Last Year: Never true    Ran Out of Food in the Last Year: Never true  Transportation Needs: No Transportation Needs (02/28/2023)   PRAPARE - Administrator, Civil Service (Medical): No    Lack of Transportation (Non-Medical): No  Physical Activity: Insufficiently Active (02/28/2023)   Exercise Vital Sign    Days of Exercise per Week: 1 day    Minutes of Exercise per Session: 20 min  Stress: No Stress Concern Present (02/28/2023)   [employer] of Occupational Health -  Occupational Stress Questionnaire    Feeling of Stress : Not at all  Social Connections: Unknown (02/28/2023)   Social Connection and Isolation Panel [NHANES]    Frequency of Communication with Friends and Family: Twice a week    Frequency of Social Gatherings with Friends and Family: Twice a week    Attends Religious Services: Patient declined    Database administrator or Organizations: No    Attends Banker Meetings: Never    Marital Status: Married  Catering manager Violence: Not At Risk (09/20/2022)   Humiliation, Afraid, Rape, and Kick questionnaire    Fear of Current or Ex-Partner: No    Emotionally Abused: No    Physically Abused: No    Sexually Abused: No    Family History  Problem Relation Age of Onset   High blood pressure Mother    Breast cancer Mother        dx before 24   Alzheimer's disease Mother    Cancer Mother    Heart attack Father    Prostate cancer Brother 47   Breast cancer Maternal Aunt        dx 22s   Cancer Maternal Aunt    Breast cancer Maternal Aunt        dx 75s   Breast cancer Maternal Grandmother        dx after 50   Cancer Maternal Grandmother    Leukemia Paternal Grandfather        dx 51s-80s   Pancreatic cancer Cousin        maternal; dx 67s   Cancer Maternal Aunt 50       unknown type   Breast cancer Cousin         paternal; dx 18s   Cancer Maternal Aunt    Diabetes Daughter     ROS: no fevers or chills, productive cough, hemoptysis, dysphasia, odynophagia, melena, hematochezia, dysuria, hematuria, rash, seizure activity, orthopnea, PND, claudication. Remaining systems are negative.  Physical Exam: Well-developed well-nourished in no acute distress.  Skin is warm and dry.  HEENT is normal.  Neck is supple.  Chest is clear to auscultation with normal expansion.  Cardiovascular exam is regular rate and rhythm.  Abdominal exam nontender or distended. No masses palpated. Extremities show trace edema. neuro grossly intact  A/P  1 chest pain-longstanding symptoms and previous CTA showed no coronary disease.  Follow-up electrocardiogram shows no diagnostic ST changes.  Will not pursue further ischemia evaluation.  2 hyperlipidemia-continue statin.  3 hypertension-patient's blood pressure is mildly elevated; however typically controlled at home.  Continue present medications and follow-up.  Olga Millers, MD

## 2023-07-21 ENCOUNTER — Ambulatory Visit: Payer: Medicare Other | Attending: Radiation Oncology

## 2023-07-21 VITALS — Wt 139.5 lb

## 2023-07-21 DIAGNOSIS — Z483 Aftercare following surgery for neoplasm: Secondary | ICD-10-CM | POA: Insufficient documentation

## 2023-07-21 NOTE — Therapy (Signed)
OUTPATIENT PHYSICAL THERAPY SOZO SCREENING NOTE   Patient Name: Cindy [name] MRN: [id number] DOB:1945-11-17, 77 y.o., female Today's Date: 07/21/2023  PCP: Christen Butter, NP REFERRING PROVIDER: Lonie Peak, MD   PT End of Session - 07/21/23 1025     Visit Number 12   # unchanged due to screen only   PT Start Time 1022    PT Stop Time 1026    PT Time Calculation (min) 4 min    Activity Tolerance Patient tolerated treatment well    Behavior During Therapy [facility] for tasks assessed/performed             Past Medical History:  Diagnosis Date   Abnormal mammogram    Allergic rhinitis 10/30/2018   Allergy    Tetracycoline. Sulfa   Anxiety in acute stress reaction 10/30/2018   Arthritis    Atypical chest pain 10/30/2018   Cancer [facility])    breast    Cataract    Both eyes surgery   Chest pain    "not cardiac related"   Colon polyps    Dyspnea    DOE   Family history of breast cancer 07/05/2020   Family history of pancreatic cancer 07/05/2020   Family history of prostate cancer 07/05/2020   Genetic testing 07/11/2020   GERD (gastroesophageal reflux disease) 10/30/2018   History of kidney stones    passed   Hypertension 10/30/2018   Hyperthyroidism    Hypothyroidism 10/30/2018   Migraine 10/30/2018   Osteoporosis 10/30/2018   Personal history of chemotherapy    Personal history of radiation therapy    Pneumonia    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   Vitamin D deficiency 10/30/2018   Past Surgical History:  Procedure Laterality Date   ABDOMINAL HYSTERECTOMY     BREAST BIOPSY Left 06/26/2020   x2   BREAST LUMPECTOMY Left 08/15/2020   BREAST LUMPECTOMY WITH RADIOACTIVE SEED AND SENTINEL LYMPH NODE BIOPSY Left 08/15/2020   Procedure: LEFT BREAST LUMPECTOMY WITH RADIOACTIVE SEED AND SENTINEL LYMPH NODE BIOPSY;  Surgeon: Almond Lint, MD;  Location: [facility] OR;  Service: General;  Laterality: Left;  RNFA   COLONOSCOPY W/ POLYPECTOMY     ESOPHAGEAL  DILATION     EYE SURGERY Bilateral    catheter with lens   HYSTERECTOMY ABDOMINAL WITH SALPINGECTOMY     KNEE SURGERY Right 09/13/2022   PORT-A-CATH REMOVAL  01/04/2021   Procedure: REMOVAL PORT-A-CATH;  Surgeon: Almond Lint, MD;  Location: [facility];  Service: General;;   PORTACATH PLACEMENT Right 08/15/2020   Procedure: INSERTION PORT-A-CATH;  Surgeon: Almond Lint, MD;  Location: [facility] OR;  Service: General;  Laterality: Right;   TONSILLECTOMY     TUBAL LIGATION     Patient Active Problem List   Diagnosis Date Noted   Lumbar spondylosis 03/04/2023   Port-A-Cath in place 09/12/2020   Genetic testing 07/11/2020   Malignant neoplasm of upper-outer quadrant of left breast in female, estrogen receptor negative (HCC) 06/30/2020   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/23/2019   Primary osteoarthritis of both hands 06/23/2019   Trigger thumb of left hand 06/23/2019   Polyarthralgia 06/23/2019   Hypertension 10/30/2018   Atypical chest pain 10/30/2018   Migraine 10/30/2018   Hypothyroidism 10/30/2018   Osteoporosis 10/30/2018   GERD (gastroesophageal reflux disease) 10/30/2018   Allergic rhinitis 10/30/2018   Vitamin D deficiency 10/30/2018    REFERRING DIAG: left breast cancer at risk for lymphedema  THERAPY DIAG: Aftercare following surgery for neoplasm  PERTINENT HISTORY: Patient was  diagnosed on 05/10/2020 with 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 She had left lumpectomy on 08/15/2020 wth 2 negative nodes removed. Ki67 is 80%. Radiation complete   PRECAUTIONS: left UE Lymphedema risk, None  SUBJECTIVE: Pt returns for her 6 month L-Dex screen.   PAIN:  Are you having pain? No  SOZO SCREENING: Patient was assessed today using the SOZO machine to determine the lymphedema index score. This was compared to her baseline score. It was determined that she is within the recommended range when compared to her baseline and no further  action is needed at this time. She will continue SOZO screenings. These are done every 3 months for 2 years post operatively followed by every 6 months for 2 years, and then annually.  Encouraged pt to resume wear of her compression bra and consider getting remeasured for new ones since it's been awhile since she was initially measured. 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 Pt verbalized understanding.   L-DEX FLOWSHEETS - 07/21/23 1000       L-DEX LYMPHEDEMA SCREENING   Measurement Type Unilateral    L-DEX MEASUREMENT EXTREMITY Upper Extremity    POSITION  Standing    DOMINANT SIDE Right    At Risk Side Left    BASELINE SCORE (UNILATERAL) 3.9    L-DEX SCORE (UNILATERAL) 3.4    VALUE CHANGE (UNILAT) -0.5              Hermenia Bers, PTA 07/21/2023, 10:26 AM

## 2023-07-30 ENCOUNTER — Encounter: Payer: Self-pay | Admitting: Cardiology

## 2023-07-30 ENCOUNTER — Ambulatory Visit (INDEPENDENT_AMBULATORY_CARE_PROVIDER_SITE_OTHER): Payer: Medicare Other | Admitting: Cardiology

## 2023-07-30 VITALS — BP 118/70 | HR 58 | Ht 64.0 in | Wt 143.4 lb

## 2023-07-30 DIAGNOSIS — I10 Essential (primary) hypertension: Secondary | ICD-10-CM | POA: Diagnosis not present

## 2023-07-30 DIAGNOSIS — E7800 Pure hypercholesterolemia, unspecified: Secondary | ICD-10-CM | POA: Diagnosis not present

## 2023-07-30 DIAGNOSIS — R072 Precordial pain: Secondary | ICD-10-CM | POA: Diagnosis not present

## 2023-07-30 NOTE — Patient Instructions (Signed)
    Follow-Up: At [facility], you and your health needs are our priority.  As part of our continuing mission to provide you with exceptional heart care, we have created designated Provider Care Teams.  These Care Teams include your primary Cardiologist (physician) and Advanced Practice Providers (APPs -  Physician Assistants and Nurse Practitioners) who all work together to provide you with the care you need, when you need it.   Your next appointment:   12 month(s)  Provider:   Olga Millers, MD

## 2023-07-31 ENCOUNTER — Other Ambulatory Visit: Payer: Self-pay | Admitting: Medical-Surgical

## 2023-07-31 DIAGNOSIS — H401133 Primary open-angle glaucoma, bilateral, severe stage: Secondary | ICD-10-CM | POA: Diagnosis not present

## 2023-07-31 DIAGNOSIS — Z961 Presence of intraocular lens: Secondary | ICD-10-CM | POA: Diagnosis not present

## 2023-07-31 DIAGNOSIS — F411 Generalized anxiety disorder: Secondary | ICD-10-CM

## 2023-08-08 ENCOUNTER — Encounter: Payer: Self-pay | Admitting: Medical-Surgical

## 2023-08-11 ENCOUNTER — Other Ambulatory Visit: Payer: Self-pay | Admitting: Adult Health

## 2023-08-11 DIAGNOSIS — Z1231 Encounter for screening mammogram for malignant neoplasm of breast: Secondary | ICD-10-CM

## 2023-08-11 DIAGNOSIS — Z853 Personal history of malignant neoplasm of breast: Secondary | ICD-10-CM

## 2023-08-12 ENCOUNTER — Other Ambulatory Visit: Payer: Self-pay | Admitting: Medical-Surgical

## 2023-08-12 ENCOUNTER — Encounter: Payer: Self-pay | Admitting: Medical-Surgical

## 2023-08-12 ENCOUNTER — Ambulatory Visit (INDEPENDENT_AMBULATORY_CARE_PROVIDER_SITE_OTHER): Payer: Medicare Other | Admitting: Medical-Surgical

## 2023-08-12 VITALS — BP 93/60 | HR 73 | Resp 20 | Ht 64.0 in | Wt 141.0 lb

## 2023-08-12 DIAGNOSIS — N61 Mastitis without abscess: Secondary | ICD-10-CM

## 2023-08-12 MED ORDER — CEPHALEXIN 500 MG PO CAPS: 500.0000 mg | ORAL_CAPSULE | Freq: Two times a day (BID) | ORAL | 0 refills | Status: DC

## 2023-08-12 NOTE — Progress Notes (Signed)
        Established patient visit  History, exam, impression, and plan:  1. Mastitis Very pleasant 77 year old female presenting today with reports of approximately 6 days of left breast swelling, tenderness, and redness.  On Thursday/Friday of last week, she got into a bath and immediately noticed that her left breast was extremely red over the entire surface.  It was also swollen nearly twice the size of her right breast and very tender to touch.  No nipple discharge noted.  Denies fevers, chills, myalgias.  Has not used any home treatment, ice, heat, etc.  Today notes that the redness began to improve after a couple of days but has not completely gone away.  The breast remains swollen although this has improved slightly.  On exam, she is still very tender to touch.  Afebrile, alert, oriented x 3.  No evidence of systemic infection but there is concern with the significant tenderness and swelling for possible mastitis.  We will plan to treat with Keflex 500 mg twice daily x 7 days to evaluate for improvement.  Discussed using heat/ice and supportive bras for symptom management.  She is due for a mammogram so we will plan to switch this to a diagnostic mammogram with left breast ultrasound if indicated. - [place] LIMITED ULTRASOUND INCLUDING AXILLA LEFT BREAST ; Future - MM 3D DIAGNOSTIC MAMMOGRAM UNILATERAL LEFT BREAST; Future  Procedures performed this visit: None.  Return if symptoms worsen or fail to improve.  __________________________________ Thayer Ohm, DNP, APRN, FNP-BC Primary Care and Sports Medicine [facility] [place]

## 2023-08-15 ENCOUNTER — Ambulatory Visit
Admission: RE | Admit: 2023-08-15 | Discharge: 2023-08-15 | Disposition: A | Discharge status: Home / Self Care | Payer: Medicare Other | Source: Ambulatory Visit | Attending: Medical-Surgical | Admitting: Medical-Surgical

## 2023-08-15 ENCOUNTER — Other Ambulatory Visit: Payer: Self-pay | Admitting: Medical-Surgical

## 2023-08-15 ENCOUNTER — Other Ambulatory Visit: Payer: Medicare Other

## 2023-08-15 DIAGNOSIS — N61 Mastitis without abscess: Secondary | ICD-10-CM

## 2023-08-15 DIAGNOSIS — Z853 Personal history of malignant neoplasm of breast: Secondary | ICD-10-CM | POA: Diagnosis not present

## 2023-08-18 ENCOUNTER — Other Ambulatory Visit: Payer: Medicare Other

## 2023-08-21 ENCOUNTER — Other Ambulatory Visit: Payer: Self-pay | Admitting: *Deleted

## 2023-08-21 DIAGNOSIS — Z171 Estrogen receptor negative status [ER-]: Secondary | ICD-10-CM

## 2023-08-21 NOTE — Assessment & Plan Note (Signed)
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  ER/PR negative, Ki-67 80%, HER-2 negative   Treatment plan: 1. adjuvant chemotherapy with CMF x6 cycles  09/05/2020- 12/19/20 2.  Follow-up adjuvant radiation 01/19/21-02/22/21 ------------------------------------------------------------------------------------------------------------------------------------------------------ Breast cancer surveillance: Breast exam 08/22/2023: Benign Mammogram and ultrasound 08/15/2023: Benign breast density category B   Osteoporosis: We will hold Prolia injection today because she is going to undergo knee replacement surgery fairly soon.   We will set her up for every 56-month Prolia injections.  After that I can see her once a year.

## 2023-08-22 ENCOUNTER — Inpatient Hospital Stay: Payer: Medicare Other

## 2023-08-22 ENCOUNTER — Inpatient Hospital Stay (HOSPITAL_BASED_OUTPATIENT_CLINIC_OR_DEPARTMENT_OTHER): Payer: Medicare Other | Admitting: Hematology and Oncology

## 2023-08-22 ENCOUNTER — Encounter: Payer: Self-pay | Admitting: *Deleted

## 2023-08-22 ENCOUNTER — Inpatient Hospital Stay: Payer: Medicare Other | Attending: Hematology and Oncology

## 2023-08-22 VITALS — BP 115/72 | HR 79 | Temp 97.8°F | Resp 18 | Ht 64.0 in | Wt 140.9 lb

## 2023-08-22 DIAGNOSIS — Z171 Estrogen receptor negative status [ER-]: Secondary | ICD-10-CM | POA: Insufficient documentation

## 2023-08-22 DIAGNOSIS — C50412 Malignant neoplasm of upper-outer quadrant of left female breast: Secondary | ICD-10-CM | POA: Diagnosis not present

## 2023-08-22 DIAGNOSIS — M810 Age-related osteoporosis without current pathological fracture: Secondary | ICD-10-CM

## 2023-08-22 DIAGNOSIS — Z79899 Other long term (current) drug therapy: Secondary | ICD-10-CM | POA: Diagnosis not present

## 2023-08-22 DIAGNOSIS — Z95828 Presence of other vascular implants and grafts: Secondary | ICD-10-CM

## 2023-08-22 LAB — CBC WITH DIFFERENTIAL (CANCER CENTER ONLY)
Abs Immature Granulocytes: 0.01 10*3/uL (ref 0.00–0.07)
Basophils Absolute: 0 10*3/uL (ref 0.0–0.1)
Basophils Relative: 1 %
Eosinophils Absolute: 0.1 10*3/uL (ref 0.0–0.5)
Eosinophils Relative: 2 %
HCT: 44.8 % (ref 36.0–46.0)
Hemoglobin: 14.7 g/dL (ref 12.0–15.0)
Immature Granulocytes: 0 %
Lymphocytes Relative: 28 %
Lymphs Abs: 1.4 10*3/uL (ref 0.7–4.0)
MCH: 31.1 pg (ref 26.0–34.0)
MCHC: 32.8 g/dL (ref 30.0–36.0)
MCV: 94.7 fL (ref 80.0–100.0)
Monocytes Absolute: 0.5 10*3/uL (ref 0.1–1.0)
Monocytes Relative: 11 %
Neutro Abs: 2.9 10*3/uL (ref 1.7–7.7)
Neutrophils Relative %: 58 %
Platelet Count: 171 10*3/uL (ref 150–400)
RBC: 4.73 MIL/uL (ref 3.87–5.11)
RDW: 13.1 % (ref 11.5–15.5)
WBC Count: 5 10*3/uL (ref 4.0–10.5)
nRBC: 0 % (ref 0.0–0.2)

## 2023-08-22 LAB — CMP (CANCER CENTER ONLY)
ALT: 13 U/L (ref 0–44)
AST: 22 U/L (ref 15–41)
Albumin: 4.1 g/dL (ref 3.5–5.0)
Alkaline Phosphatase: 69 U/L (ref 38–126)
Anion gap: 4 — ABNORMAL LOW (ref 5–15)
BUN: 23 mg/dL (ref 8–23)
CO2: 30 mmol/L (ref 22–32)
Calcium: 9.4 mg/dL (ref 8.9–10.3)
Chloride: 105 mmol/L (ref 98–111)
Creatinine: 1.07 mg/dL — ABNORMAL HIGH (ref 0.44–1.00)
GFR, Estimated: 53 mL/min — ABNORMAL LOW (ref >60)
Glucose, Bld: 98 mg/dL (ref 70–99)
Potassium: 5.3 mmol/L — ABNORMAL HIGH (ref 3.5–5.1)
Sodium: 139 mmol/L (ref 135–145)
Total Bilirubin: 0.8 mg/dL (ref <1.2)
Total Protein: 6.6 g/dL (ref 6.5–8.1)

## 2023-08-22 MED ORDER — DENOSUMAB 60 MG/ML ~~LOC~~ SOSY
60.0000 mg | PREFILLED_SYRINGE | Freq: Once | SUBCUTANEOUS | Status: AC
  Administered 2023-08-22: 60 mg via SUBCUTANEOUS
  Filled 2023-08-22: qty 1

## 2023-08-22 NOTE — Progress Notes (Signed)
Patient Care Team: Christen Butter, NP as PCP - General (Nurse Practitioner) Lewayne Bunting, MD as PCP - Cardiology (Cardiology) Almond Lint, MD as Consulting Physician (General Surgery) Serena Croissant, MD as Consulting Physician (Hematology and Oncology) Lonie Peak, MD as Attending Physician (Radiation Oncology)  DIAGNOSIS:  Encounter Diagnosis  Name Primary?   Malignant neoplasm of upper-outer quadrant of left breast in female, estrogen receptor negative (HCC) Yes    SUMMARY OF ONCOLOGIC HISTORY: Oncology History  Malignant neoplasm of upper-outer quadrant of left breast in female, estrogen receptor negative (HCC)  06/30/2020 Initial Diagnosis   Screening mammogram showed a possible left breast mass. Diagnostic mammogram showed two adjacent masses at the 1 o'clock position, 0.7cm and 0.8cm, about 0.5cm apart, and spanning in total 1.8cm, no left axillary adenopathy. Biopsy showed IDC with squamous differentiation and DCIS, grade 3, HER-2 negative (1+), ER/PR negative, Ki67 80%.    07/05/2020 Cancer Staging   Staging form: Breast, AJCC 8th Edition - Clinical stage from 07/05/2020: Stage IB (cT1c, cN0, cM0, G3, ER-, PR-, HER2-) - Signed by Serena Croissant, MD on 07/05/2020   07/11/2020 Genetic Testing   Negative genetic testing: no mutations detected in [facility] Common Hereditary Cancers Panel.  The report date is July 11, 2020.   The Common Hereditary Cancers Panel offered by [facility] includes sequencing and/or deletion duplication testing of the following 48 genes: APC, ATM, AXIN2, BARD1, BMPR1A, BRCA1, BRCA2, BRIP1, CDH1, CDK4, CDKN2A (p14ARF), CDKN2A (p16INK4a), CHEK2, CTNNA1, DICER1, EPCAM (Deletion/duplication testing only), GREM1 (promoter region deletion/duplication testing only), KIT, MEN1, MLH1, MSH2, MSH3, MSH6, MUTYH, NBN, NF1, NHTL1, PALB2, PDGFRA, PMS2, POLD1, POLE, PTEN, RAD50, RAD51C, RAD51D, RNF43, SDHB, SDHC, SDHD, SMAD4, SMARCA4. STK11, TP53, TSC1, TSC2, and VHL.   The following genes were evaluated for sequence changes only: SDHA and HOXB13 c.251G>A variant only.   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   08/15/2020 Cancer Staging   Staging form: Breast, AJCC 8th Edition - Pathologic stage from 08/15/2020: Stage IB (pT1c, pN0, cM0, G3, ER-, PR-, HER2-) - Signed by Loa Socks, NP on 05/17/2021 Stage prefix: Initial diagnosis Histologic grading system: 3 grade system   09/05/2020 - 12/21/2020 Chemotherapy   CMF x 6 cycles       01/18/2021 - 02/16/2021 Radiation Therapy   Adjuvant radiation therapy with Dr. Basilio Cairo     CHIEF COMPLIANT: Surveillance of breast cancer  HISTORY OF PRESENT ILLNESS:   History of Present Illness   The patient, a breast cancer survivor, presents three years post-diagnosis. She reports ongoing struggles with energy levels and strength, with some days feeling fine and others not. She notes that this is a significant change from her pre-diagnosis state. She also mentions a recent knee surgery six months ago, which may be contributing to her current state of health.  In addition to these primary concerns, the patient experienced an episode of intense redness in the breast that had been treated with radiation. This occurred without any known cause and eventually faded. The patient was concerned about the possibility of a recurrence of cancer, but subsequent mammograms and ultrasounds showed no signs of malignancy.  The patient also mentions a lack of regular exercise, which she acknowledges could be contributing to her lack of energy. She expresses interest in a clinical trial aimed at improving strength and energy levels in patients who have undergone chemotherapy.  ALLERGIES:  is allergic to sulfamethoxazole-trimethoprim and tetracyclines &  related.  MEDICATIONS:  Current Outpatient Medications  Medication Sig Dispense Refill   albuterol (VENTOLIN HFA) 108 (90 Base) MCG/ACT inhaler Inhale 2 puffs into the lungs every 6 (six) hours as needed for wheezing. 2 each 11   azelastine (ASTELIN) 0.1 % nasal spray Place 2 sprays into both nostrils 2 (two) times daily as needed for rhinitis. Use in each nostril as directed 30 mL 1   Calcium-Vitamin D-Vitamin K 500-100-40 MG-UNT-MCG CHEW Chew 2 tablets by mouth daily.     denosumab (PROLIA) 60 MG/ML SOSY injection Inject 60 mg into the skin every 6 (six) months.     escitalopram (LEXAPRO) 5 MG tablet Take 1 tablet by mouth once daily 90 tablet 0   esomeprazole (NEXIUM) 40 MG capsule Take 1 capsule (40 mg total) by mouth daily. 90 capsule 0   fluticasone (FLONASE) 50 MCG/ACT nasal spray Place 2 sprays into both nostrils daily. 1 g 2   hydrochlorothiazide (HYDRODIURIL) 25 MG tablet TAKE 1 TABLET BY MOUTH ONCE DAILY AS NEEDED SWELLING 90 tablet 1   latanoprost (XALATAN) 0.005 % ophthalmic solution      levothyroxine (SYNTHROID) 75 MCG tablet Take 1 tablet (75 mcg total) by mouth daily before breakfast. 90 tablet 1   NIFEdipine (ADALAT CC) 30 MG 24 hr tablet TAKE 1 TABLET BY MOUTH ONCE DAILY . APPOINTMENT REQUIRED FOR FUTURE REFILLS 90 tablet 0   nitroGLYCERIN (NITROSTAT) 0.4 MG SL tablet Place 1 tablet (0.4 mg total) under the tongue every 5 (five) minutes as needed for chest pain. 20 tablet 1   rosuvastatin (CRESTOR) 20 MG tablet Take 1 tablet (20 mg total) by mouth daily. 90 tablet 3   SUMAtriptan (IMITREX) 100 MG tablet Take 0.5-1 tablets (50-100 mg total) by mouth every 2 (two) hours as needed for migraine (Max 2 pills per 24 hours). 10 tablet 3   traMADol (ULTRAM) 50 MG tablet Take 1 tablet (50 mg total) by mouth every 8 (eight) hours as needed for moderate pain. 90 tablet 0   vitamin B-12 (CYANOCOBALAMIN) 500 MCG tablet Take 500 mcg by mouth daily.      No current facility-administered  medications for this visit.    PHYSICAL EXAMINATION: ECOG PERFORMANCE STATUS: 1 - Symptomatic but completely ambulatory  Vitals:   08/22/23 0938  BP: 115/72  Pulse: 79  Resp: 18  Temp: 97.8 F (36.6 C)  SpO2: 95%   Filed Weights   08/22/23 0938  Weight: 140 lb 14.4 oz (63.9 kg)    Physical Exam   BREAST: No abnormalities observed. Erythema previously noted on one breast, described as 'blood red', resolved. Identified as radiation recall.      (exam performed in the presence of a chaperone)  LABORATORY DATA:  I have reviewed the data as listed    Latest Ref Rng & Units 08/22/2023    9:21 AM 04/15/2023   11:07 AM 02/20/2023    9:19 AM  CMP  Glucose 70 - 99 mg/dL 98  84  92   BUN 8 - 23 mg/dL 23  15  13    Creatinine 0.44 - 1.00 mg/dL 5.63  8.75  6.43   Sodium 135 - 145 mmol/L 139  142  143   Potassium 3.5 - 5.1 mmol/L 5.3  4.1  4.3   Chloride 98 - 111 mmol/L 105  109  108   CO2 22 - 32 mmol/L 30  22  30    Calcium 8.9 -  10.3 mg/dL 9.4  8.0  9.6   Total Protein 6.5 - 8.1 g/dL 6.6  6.2  6.8   Total Bilirubin <1.2 mg/dL 0.8  0.4  0.6   Alkaline Phos 38 - 126 U/L 69  160  179   AST 15 - 41 U/L 22  19  19    ALT 0 - 44 U/L 13  11  12      Lab Results  Component Value Date   WBC 5.0 08/22/2023   HGB 14.7 08/22/2023   HCT 44.8 08/22/2023   MCV 94.7 08/22/2023   PLT 171 08/22/2023   NEUTROABS 2.9 08/22/2023    ASSESSMENT & PLAN:  Malignant neoplasm of upper-outer quadrant of left breast in female, estrogen receptor negative (HCC) 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  ER/PR negative, Ki-67 80%, HER-2 negative   Treatment plan: 1. adjuvant chemotherapy with CMF x6 cycles  09/05/2020- 12/19/20 2.  Follow-up adjuvant radiation  01/19/21-02/22/21 ------------------------------------------------------------------------------------------------------------------------------------------------------ Breast cancer surveillance: Breast exam 08/22/2023: Benign Mammogram and ultrasound 08/15/2023: Benign breast density category B   Osteoporosis: We will hold Prolia injection today because she is going to undergo knee replacement surgery fairly soon.   We will set her up for every 8-month Prolia injections.  After that I can see her once a year. ------------------------------------- Assessment and Plan    Breast Cancer Three years post-diagnosis, no recurrence noted on recent mammogram. Patient experienced radiation recall, a benign condition that can mimic infection or recurrence. -Continue current follow-up plan. -Consider participation in clinical trial for fatigue management.  Post-Chemotherapy Fatigue Patient reports variable energy levels and difficulty maintaining exercise routine. -Consider participation in clinical trial for fatigue management. -Encourage regular exercise and consider personal trainer or gym membership for accountability.  Knee Surgery Recovery Six months post-operation, patient reports good progress. -Continue current rehabilitation plan.  Osteoporosis Patient on Prolia for bone health. -Transfer Prolia administration to primary care provider.  General Health Maintenance -Consider participation in clinical trial for fatigue management. -Continue regular exercise. -Transfer Prolia administration to primary care provider. -Continue current follow-up plan for breast cancer surveillance.     If she does not participate in the clinical trial then she could be seen on an as-needed basis.      No orders of the defined types were placed in this encounter.  The patient has a good understanding of the overall plan. she agrees with it. she will call with any problems that may develop before the  next visit here. Total time spent: 30 mins including face to face time and time spent for planning, charting and co-ordination of care   Tamsen Meek, MD 08/22/23

## 2023-08-22 NOTE — Research (Signed)
[facility] 18007: RANDOMIZED [place] CONTROLLED TRIAL OF BUPROPION FOR CANCER RELATED FATIGUE   Patient Cindy [name] was identified by Dr. Pamelia Hoit as a potential candidate for the above listed study.  This Clinical Research Nurse met with Talicia Gosier, [id number], on 08/22/23 in a manner and location that ensures patient privacy to discuss participation in the above listed research study.  Patient is Unaccompanied.  A copy of the informed consent document and separate HIPAA Authorization was provided to the patient.  Patient reads, speaks, and understands [place].   Patient was provided with the business card of this Nurse and encouraged to contact the research team with any questions.  Approximately 35 minutes was spent with the patient reviewing the informed consent documents.  Patient was provided the option of taking informed consent documents home to review and was encouraged to review at their convenience with their support network, including other care providers. Patient took the consent documents home to review.  The patient said that she would like 1 week to consider participation in the study.  The nurse will call the pt next Friday, 12/20, to answer any study related questions.  This nurse will begin the eligibility review.  The pt was thanked for her time and consideration of this study. Janan Ridge RN, BSN, CCRP Clinical Research Nurse Lead 08/22/2023 11:08 AM

## 2023-08-22 NOTE — Patient Instructions (Signed)
Denosumab Injection (Osteoporosis) What is this medication? DENOSUMAB (den oh SUE mab) prevents and treats osteoporosis. It works by making your bones stronger and less likely to break (fracture). It is a monoclonal antibody. This medicine may be used for other purposes; ask your health care provider or pharmacist if you have questions. COMMON BRAND NAME(S): Prolia What should I tell my care team before I take this medication? They need to know if you have any of these conditions: Dental or gum disease Had thyroid or parathyroid (glands located in neck) surgery Having dental surgery or a tooth pulled Kidney disease Low levels of calcium in the blood On dialysis Poor nutrition Thyroid disease Trouble absorbing nutrients from your food An unusual or allergic reaction to denosumab, other medications, foods, dyes, or preservatives Pregnant or trying to get pregnant Breastfeeding How should I use this medication? This medication is injected under the skin. It is given by your care team in a hospital or clinic setting. A special MedGuide will be given to you before each treatment. Be sure to read this information carefully each time. Talk to your care team about the use of this medication in children. Special care may be needed. Overdosage: If you think you have taken too much of this medicine contact a poison control center or emergency room at once. NOTE: This medicine is only for you. Do not share this medicine with others. What if I miss a dose? Keep appointments for follow-up doses. It is important not to miss your dose. Call your care team if you are unable to keep an appointment. What may interact with this medication? Do not take this medication with any of the following: Other medications that contain denosumab This medication may also interact with the following: Medications that lower your chance of fighting infection Steroid medications, such as prednisone or cortisone This  list may not describe all possible interactions. Give your health care provider a list of all the medicines, herbs, non-prescription drugs, or dietary supplements you use. Also tell them if you smoke, drink alcohol, or use illegal drugs. Some items may interact with your medicine. What should I watch for while using this medication? Your condition will be monitored carefully while you are receiving this medication. You may need blood work done while taking this medication. This medication may increase your risk of getting an infection. Call your care team for advice if you get a fever, chills, sore throat, or other symptoms of a cold or flu. Do not treat yourself. Try to avoid being around people who are sick. Tell your dentist and dental surgeon that you are taking this medication. You should not have major dental surgery while on this medication. See your dentist to have a dental exam and fix any dental problems before starting this medication. Take good care of your teeth while on this medication. Make sure you see your dentist for regular follow-up appointments. This medication may cause low levels of calcium in your body. The risk of severe side effects is increased in people with kidney disease. Your care team may prescribe calcium and vitamin D to help prevent low calcium levels while you take this medication. It is important to take calcium and vitamin D as directed by your care team. Talk to your care team if you may be pregnant. Serious birth defects may occur if you take this medication during pregnancy and for 5 months after the last dose. You will need a negative pregnancy test before starting this medication. Contraception   is recommended while taking this medication and for 5 months after the last dose. Your care team can help you find the option that works for you. Talk to your care team before breastfeeding. Changes to your treatment plan may be needed. What side effects may I notice from  receiving this medication? Side effects that you should report to your care team as soon as possible: Allergic reactions--skin rash, itching, hives, swelling of the face, lips, tongue, or throat Infection--fever, chills, cough, sore throat, wounds that don't heal, pain or trouble when passing urine, general feeling of discomfort or being unwell Low calcium level--muscle pain or cramps, confusion, tingling, or numbness in the hands or feet Osteonecrosis of the jaw--pain, swelling, or redness in the mouth, numbness of the jaw, poor healing after dental work, unusual discharge from the mouth, visible bones in the mouth Severe bone, joint, or muscle pain Skin infection--skin redness, swelling, warmth, or pain Side effects that usually do not require medical attention (report these to your care team if they continue or are bothersome): Back pain Headache Joint pain Muscle pain Pain in the hands, arms, legs, or feet Runny or stuffy nose Sore throat This list may not describe all possible side effects. Call your doctor for medical advice about side effects. You may report side effects to FDA at 1-800-FDA-1088. Where should I keep my medication? This medication is given in a hospital or clinic. It will not be stored at home. NOTE: This sheet is a summary. It may not cover all possible information. If you have questions about this medicine, talk to your doctor, pharmacist, or health care provider.  2024 Elsevier/Gold Standard (2022-10-01 00:00:00)  

## 2023-08-28 ENCOUNTER — Telehealth: Payer: Self-pay | Admitting: *Deleted

## 2023-08-28 NOTE — Telephone Encounter (Signed)
DCP-001: Use of a Clinical Trial Screening Tool to Address Cancer Health Disparities in the [facility] Research Program [facility])   Patient Cindy [name] was identified by Dr. Pamelia Hoit as a potential candidate for the above listed study.  This Clinical Research Nurse spoke with Adannaya Fernholz, [id number], by phone to discuss participation in the above listed research study.  A copy of the informed consent document and separate HIPAA Authorization was offered to the patient.  Patient reads, speaks, and understands [place].    Approximately 5 minutes was spent with the patient reviewing the informed consent documents.  Patient was provided the option of being emailed the informed consent documents to review and was encouraged to review at their convenience with their support network, including other care providers. Patient is comfortable with making a decision regarding study participation today.   Pt declined participation.  The pt was thanked for her time and consideration. Janan Ridge RN, BSN, CCRP Clinical Research Nurse Lead 08/28/2023 11:04 AM

## 2023-08-28 NOTE — Telephone Encounter (Signed)
[facility] 18007: RANDOMIZED [place] CONTROLLED TRIAL OF BUPROPION FOR CANCER RELATED FATIGUE   This nurse called the patient to answer any research related questions about the above study.  The pt said that she read over the consent form and decided that she does not want to participate in this research study.  The pt was asked if there were any concerns about the study that the nurse could address.  The pt could provide no reason for declining the study.  The pt thanked the nurse for going over the study with her last week.  The pt was thanked for her consideration of the study. Dr. Pamelia Hoit will be notified that this pt declined participation.    Janan Ridge RN, BSN, CCRP Clinical Research Nurse Lead 08/28/2023 10:56 AM

## 2023-09-13 ENCOUNTER — Other Ambulatory Visit: Payer: Self-pay | Admitting: Medical-Surgical

## 2023-09-16 ENCOUNTER — Encounter: Payer: Self-pay | Admitting: Cardiology

## 2023-09-16 DIAGNOSIS — M545 Low back pain, unspecified: Secondary | ICD-10-CM | POA: Diagnosis not present

## 2023-09-16 DIAGNOSIS — M25561 Pain in right knee: Secondary | ICD-10-CM | POA: Diagnosis not present

## 2023-09-22 DIAGNOSIS — H401133 Primary open-angle glaucoma, bilateral, severe stage: Secondary | ICD-10-CM | POA: Diagnosis not present

## 2023-09-22 DIAGNOSIS — Z961 Presence of intraocular lens: Secondary | ICD-10-CM | POA: Diagnosis not present

## 2023-09-23 ENCOUNTER — Ambulatory Visit (INDEPENDENT_AMBULATORY_CARE_PROVIDER_SITE_OTHER): Payer: Medicare Other | Admitting: Family Medicine

## 2023-09-23 VITALS — BP 122/81 | HR 68 | Temp 94.2°F | Resp 12 | Ht 64.0 in | Wt 142.8 lb

## 2023-09-23 DIAGNOSIS — Z0000 Encounter for general adult medical examination without abnormal findings: Secondary | ICD-10-CM

## 2023-09-23 DIAGNOSIS — M810 Age-related osteoporosis without current pathological fracture: Secondary | ICD-10-CM | POA: Diagnosis not present

## 2023-09-23 NOTE — Progress Notes (Signed)
 Subjective:   Cindy [name] is a 78 y.o. female who presents for Medicare Annual (Subsequent) preventive examination.  Visit Complete: In person  Patient Medicare AWV questionnaire was completed by the patient on 09/22/22; I have confirmed that all information answered by patient is correct and no changes since this date.  Cardiac Risk Factors include: advanced age (>21men, >65 women);hypertension     Objective:    Today's Vitals   09/23/23 0858  BP: 122/81  Pulse: 68  Resp: 12  Temp: (!) 94.2 F (34.6 C)  TempSrc: Oral  SpO2: 93%  Weight: 142 lb 12.8 oz (64.8 kg)  Height: 5' 4 (1.626 m)   Body mass index is 24.51 kg/m.     09/23/2023    9:20 AM 03/18/2023   10:18 AM 09/20/2022    8:46 AM 08/21/2022   10:10 AM 09/12/2021    9:15 AM 03/30/2021   11:47 AM 02/14/2021   11:12 AM  Advanced Directives  Does Patient Have a Medical Advance Directive? Yes Yes Yes Yes Yes Yes Yes  Type of Estate Agent of [place];Living will Healthcare Power of [place];Living will Living will Healthcare Power of [place];Living will Living will;Healthcare Power of [employer] Power of [place];Living will Healthcare Power of [place];Living will  Does patient want to make changes to medical advance directive? No - Patient declined No - Patient declined No - Patient declined No - Patient declined No - Patient declined No - Patient declined No - Patient declined  Copy of Healthcare Power of Attorney in Chart?    Yes - validated most recent copy scanned in chart (See row information) No - copy requested Yes - validated most recent copy scanned in chart (See row information)     Current Medications (verified) Outpatient Encounter Medications as of 09/23/2023  Medication Sig   albuterol  (VENTOLIN  HFA) 108 (90 Base) MCG/ACT inhaler Inhale 2 puffs into the lungs every 6 (six) hours as needed for wheezing.   azelastine  (ASTELIN ) 0.1 % nasal spray Place 2 sprays into both  nostrils 2 (two) times daily as needed for rhinitis. Use in each nostril as directed   denosumab  (PROLIA ) 60 MG/ML SOSY injection Inject 60 mg into the skin every 6 (six) months.   escitalopram  (LEXAPRO ) 5 MG tablet Take 1 tablet by mouth once daily   esomeprazole  (NEXIUM ) 40 MG capsule Take 1 capsule (40 mg total) by mouth daily.   hydrochlorothiazide  (HYDRODIURIL ) 25 MG tablet TAKE 1 TABLET BY MOUTH ONCE DAILY AS NEEDED SWELLING   latanoprost (XALATAN) 0.005 % ophthalmic solution    levothyroxine  (SYNTHROID ) 75 MCG tablet Take 1 tablet (75 mcg total) by mouth daily before breakfast.   NIFEdipine  (ADALAT  CC) 30 MG 24 hr tablet TAKE 1 TABLET BY MOUTH ONCE DAILY . APPOINTMENT REQUIRED FOR FUTURE REFILLS   nitroGLYCERIN  (NITROSTAT ) 0.4 MG SL tablet Place 1 tablet (0.4 mg total) under the tongue every 5 (five) minutes as needed for chest pain.   rosuvastatin  (CRESTOR ) 20 MG tablet Take 1 tablet (20 mg total) by mouth daily.   traMADol  (ULTRAM ) 50 MG tablet Take 1 tablet (50 mg total) by mouth every 8 (eight) hours as needed for moderate pain.   vitamin B-12 (CYANOCOBALAMIN) 500 MCG tablet Take 500 mcg by mouth daily.    Calcium -Vitamin D -Vitamin K 500-100-40 MG-UNT-MCG CHEW Chew 2 tablets by mouth daily. (Patient not taking: Reported on 09/23/2023)   fluticasone  (FLONASE ) 50 MCG/ACT nasal spray Place 2 sprays into both nostrils daily.   SUMAtriptan  (  IMITREX ) 100 MG tablet Take 0.5-1 tablets (50-100 mg total) by mouth every 2 (two) hours as needed for migraine (Max 2 pills per 24 hours). (Patient not taking: Reported on 09/23/2023)   No facility-administered encounter medications on file as of 09/23/2023.    Allergies (verified) Sulfamethoxazole-trimethoprim and Tetracyclines & related   History: Past Medical History:  Diagnosis Date   Abnormal mammogram    Allergic rhinitis 10/30/2018   Allergy    Tetracycoline. Sulfa   Anxiety in acute stress reaction 10/30/2018   Arthritis    Atypical  chest pain 10/30/2018   Cancer [facility])    breast    Cataract    Both eyes surgery   Chest pain    not cardiac related   Colon polyps    Dyspnea    DOE   Family history of breast cancer 07/05/2020   Family history of pancreatic cancer 07/05/2020   Family history of prostate cancer 07/05/2020   Genetic testing 07/11/2020   GERD (gastroesophageal reflux disease) 10/30/2018   History of kidney stones    passed   Hypertension 10/30/2018   Hyperthyroidism    Hypothyroidism 10/30/2018   Migraine 10/30/2018   Osteoporosis 10/30/2018   Personal history of chemotherapy    Personal history of radiation therapy    Pneumonia    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   Vitamin D  deficiency 10/30/2018   Past Surgical History:  Procedure Laterality Date   ABDOMINAL HYSTERECTOMY     BREAST BIOPSY Left 06/26/2020   x2   BREAST LUMPECTOMY Left 08/15/2020   BREAST LUMPECTOMY WITH RADIOACTIVE SEED AND SENTINEL LYMPH NODE BIOPSY Left 08/15/2020   Procedure: LEFT BREAST LUMPECTOMY WITH RADIOACTIVE SEED AND SENTINEL LYMPH NODE BIOPSY;  Surgeon: Aron Shoulders, MD;  Location: [facility] OR;  Service: General;  Laterality: Left;  RNFA   COLONOSCOPY W/ POLYPECTOMY     ESOPHAGEAL DILATION     EYE SURGERY Bilateral    catheter with lens   HYSTERECTOMY ABDOMINAL WITH SALPINGECTOMY     KNEE SURGERY Right 09/13/2022   PORT-A-CATH REMOVAL  01/04/2021   Procedure: REMOVAL PORT-A-CATH;  Surgeon: Aron Shoulders, MD;  Location: [facility] [place];  Service: General;;   PORTACATH PLACEMENT Right 08/15/2020   Procedure: INSERTION PORT-A-CATH;  Surgeon: Aron Shoulders, MD;  Location: [facility] OR;  Service: General;  Laterality: Right;   TONSILLECTOMY     TUBAL LIGATION     Family History  Problem Relation Age of Onset   High blood pressure Mother    Breast cancer Mother        dx before 77   Alzheimer's disease Mother    Cancer Mother    Heart attack Father    Prostate cancer Brother 26   Breast  cancer Maternal Aunt        dx 87s   Cancer Maternal Aunt    Breast cancer Maternal Aunt        dx 90s   Breast cancer Maternal Grandmother        dx after 50   Cancer Maternal Grandmother    Leukemia Paternal Grandfather        dx 57s-80s   Pancreatic cancer Cousin        maternal; dx 43s   Cancer Maternal Aunt 50       unknown type   Breast cancer Cousin        paternal; dx 29s   Cancer Maternal Aunt    Diabetes Daughter    Social History  Socioeconomic History   Marital status: Married    Spouse name: Cindy [name]   Number of children: 2   Years of education: 15   Highest education level: Some college, no degree  Occupational History   Occupation: Retired.  Tobacco Use   Smoking status: Never   Smokeless tobacco: Never  Vaping Use   Vaping status: Never Used  Substance and Sexual Activity   Alcohol use: Not Currently    Comment: rarely   Drug use: Never   Sexual activity: Not Currently    Partners: Male    Birth control/protection: None  Other Topics Concern   Not on file  Social History Narrative   Lives with her husband. She enjoys water colors and gardening.   Social Drivers of Corporate Investment Banker Strain: Low Risk  (09/23/2023)   Overall Financial Resource Strain (CARDIA)    Difficulty of Paying Living Expenses: Not hard at all  Food Insecurity: No Food Insecurity (09/23/2023)   Hunger Vital Sign    Worried About Running Out of Food in the Last Year: Never true    Ran Out of Food in the Last Year: Never true  Transportation Needs: No Transportation Needs (09/23/2023)   PRAPARE - Administrator, Civil Service (Medical): No    Lack of Transportation (Non-Medical): No  Physical Activity: Insufficiently Active (09/23/2023)   Exercise Vital Sign    Days of Exercise per Week: 2 days    Minutes of Exercise per Session: 10 min  Stress: No Stress Concern Present (09/23/2023)   [employer] of Occupational Health - Occupational Stress  Questionnaire    Feeling of Stress : Not at all  Social Connections: Unknown (09/23/2023)   Social Connection and Isolation Panel [NHANES]    Frequency of Communication with Friends and Family: More than three times a week    Frequency of Social Gatherings with Friends and Family: Twice a week    Attends Religious Services: Patient declined    Database Administrator or Organizations: No    Attends Engineer, Structural: Never    Marital Status: Married    Tobacco Counseling Counseling given: Not Answered   Clinical Intake:  Pre-visit preparation completed: Yes  Pain : No/denies pain     BMI - recorded: 24.5 Nutritional Status: BMI of 19-24  Normal Nutritional Risks: None Diabetes: No  How often do you need to have someone help you when you read instructions, pamphlets, or other written materials from your doctor or pharmacy?: 1 - Never What is the last grade level you completed in school?: 14  Interpreter Needed?: No      Activities of Daily Living    09/23/2023    9:10 AM 09/23/2023    8:45 AM  In your present state of health, do you have any difficulty performing the following activities:  Hearing? 0 1  Comment wears heating aids   Vision? 0 0  Difficulty concentrating or making decisions? 0 0  Walking or climbing stairs? 0 0  Dressing or bathing? 0 0  Doing errands, shopping? 0 0  Preparing Food and eating ? N N  Using the Toilet? N N  In the past six months, have you accidently leaked urine? N N  Do you have problems with loss of bowel control? N N  Managing your Medications? N N  Managing your Finances? N N  Housekeeping or managing your Housekeeping? N N    Patient Care Team: Willo Mini, NP  as PCP - General (Nurse Practitioner) Pietro Redell RAMAN, MD as PCP - Cardiology (Cardiology) Yvone, MD, Orthopedic surgeon Curtis DASEN, MD Sports medicine  Alena Raring, MD Ophthalmology     Indicate any recent Medical Services you may  have received from other than [facility] providers in the past year (date may be approximate).     Assessment:   This is a routine wellness examination for Cindy [name].  Hearing/Vision screen Hearing Screening - Comments:: Wears hearing aid.  Vision Screening - Comments:: Grossly intact, wears glasses without issues with vision,    Goals Addressed             This Visit's Progress    DIET - INCREASE WATER INTAKE       Goal to drink 64 ounces per day.       Depression Screen    09/23/2023    9:20 AM 09/20/2022    8:46 AM 08/16/2022    3:36 PM 07/15/2022   10:25 AM 09/12/2021    9:15 AM 06/12/2021    9:16 AM 03/01/2019    9:55 AM  PHQ 2/9 Scores  PHQ - 2 Score 0 0 0 0 0 0 0  PHQ- 9 Score       2    Fall Risk    09/23/2023    9:21 AM 09/23/2023    8:45 AM 09/20/2022    8:46 AM 07/15/2022   10:25 AM 12/11/2021    9:51 AM  Fall Risk   Falls in the past year? 0 0 0 1 0  Number falls in past yr: 0 0 0 0 0  Injury with Fall? 0 0 0 0 0  Risk for fall due to : No Fall Risks  No Fall Risks No Fall Risks No Fall Risks  Follow up   Falls evaluation completed Falls evaluation completed Falls evaluation completed    MEDICARE RISK AT HOME: Medicare Risk at Home Any stairs in or around the home?: Yes If so, are there any without handrails?: Yes Home free of loose throw rugs in walkways, pet beds, electrical cords, etc?: Yes Adequate lighting in your home to reduce risk of falls?: Yes Life alert?: No Use of a cane, walker or w/c?: No Grab bars in the bathroom?: No Shower chair or bench in shower?: No Elevated toilet seat or a handicapped toilet?: No  TIMED UP AND GO:  Was the test performed?  Yes  Length of time to ambulate 10 feet: 12 sec Gait steady and fast without use of assistive device    Cognitive Function:        09/23/2023    9:22 AM 09/20/2022    8:51 AM 09/12/2021    9:21 AM  6CIT Screen  What Year? 0 points 0 points 0 points  What month? 0 points 0 points 0 points  What  time? 0 points 0 points 0 points  Count back from 20 0 points 0 points 0 points  Months in reverse 0 points 0 points 0 points  Repeat phrase 2 points 0 points 2 points  Total Score 2 points 0 points 2 points    Immunizations Immunization History  Administered Date(s) Administered   PFIZER(Purple Top)SARS-COV-2 Vaccination 07/05/2020, 07/26/2020   Tdap 09/09/2013    TDAP status: Due, Education has been provided regarding the importance of this vaccine. Advised may receive this vaccine at local pharmacy or Health Dept. Aware to provide a copy of the vaccination record if obtained from local pharmacy or Health Dept.  Verbalized acceptance and understanding.  Flu Vaccine status: Declined, Education has been provided regarding the importance of this vaccine but patient still declined. Advised may receive this vaccine at local pharmacy or Health Dept. Aware to provide a copy of the vaccination record if obtained from local pharmacy or Health Dept. Verbalized acceptance and understanding.  Pneumococcal vaccine status: Declined,  Education has been provided regarding the importance of this vaccine but patient still declined. Advised may receive this vaccine at local pharmacy or Health Dept. Aware to provide a copy of the vaccination record if obtained from local pharmacy or Health Dept. Verbalized acceptance and understanding.   Covid-19 vaccine status: Declined, Education has been provided regarding the importance of this vaccine but patient still declined. Advised may receive this vaccine at local pharmacy or Health Dept.or vaccine clinic. Aware to provide a copy of the vaccination record if obtained from local pharmacy or Health Dept. Verbalized acceptance and understanding.  Qualifies for Shingles Vaccine? Yes   Zostavax completed No   Shingrix Completed?: No.    Education has been provided regarding the importance of this vaccine. Patient has been advised to call insurance company to determine  out of pocket expense if they have not yet received this vaccine. Advised may also receive vaccine at local pharmacy or Health Dept. Verbalized acceptance and understanding.  Screening Tests Health Maintenance  Topic Date Due   Pneumonia Vaccine 57+ Years old (1 of 2 - PCV) Never done   COVID-19 Vaccine (3 - Pfizer risk series) 08/23/2020   DEXA SCAN  05/10/2022   DTaP/Tdap/Td (2 - Td or Tdap) 09/10/2023   Zoster Vaccines- Shingrix (1 of 2) 11/10/2023 (Originally 02/20/1965)   INFLUENZA VACCINE  12/08/2023 (Originally 04/10/2023)   Medicare Annual Wellness (AWV)  09/22/2024   Hepatitis C Screening  Completed   HPV VACCINES  Aged Out    Health Maintenance  Health Maintenance Due  Topic Date Due   Pneumonia Vaccine 86+ Years old (1 of 2 - PCV) Never done   COVID-19 Vaccine (3 - Pfizer risk series) 08/23/2020   DEXA SCAN  05/10/2022   DTaP/Tdap/Td (2 - Td or Tdap) 09/10/2023    Colorectal cancer screening: No longer required.   Mammogram status: Completed 08/15/23. Repeat every year  Bone Density status: Completed 05/10/2020. Results reflect: Bone density results: OSTEOPOROSIS. Repeat every 2 years.  Lung Cancer Screening: (Low Dose CT Chest recommended if Age 39-80 years, 20 pack-year currently smoking OR have quit w/in 15years.) does not qualify.   Lung Cancer Screening Referral: n/a  Additional Screening:  Hepatitis C Screening: does qualify; Completed 03/01/2019.   Vision Screening: Recommended annual ophthalmology exams for early detection of glaucoma and other disorders of the eye. Is the patient up to date with their annual eye exam?  Yes  Who is the provider or what is the name of the office in which the patient attends annual eye exams? Provider in [place] (unsure of name0 If pt is not established with a provider, would they like to be referred to a provider to establish care? No .   Dental Screening: Recommended annual dental exams for proper oral hygiene  Diabetic Foot  Exam: n/a   Community Resource Referral / Chronic Care Management: CRR required this visit?  No   CCM required this visit?  No     Plan:     I have personally reviewed and noted the following in the patient's chart:   Medical and social history Use of alcohol, tobacco or  illicit drugs  Current medications and supplements including opioid prescriptions. Patient is currently taking opioid prescriptions. Information provided to patient regarding non-opioid alternatives. Patient advised to discuss non-opioid treatment plan with their provider. Functional ability and status Nutritional status Physical activity Advanced directives List of other physicians Hospitalizations, surgeries, and ER visits in previous 12 months: Right knee replacement in July 2024.  Vitals Screenings to include cognitive, depression, and falls Referrals and appointments: Bone density   In addition, I have reviewed and discussed with patient certain preventive protocols, quality metrics, and best practice recommendations. A written personalized care plan for preventive services as well as general preventive health recommendations were provided to patient.     Cindy JONELLE Brownie, FNP   09/23/2023   After Visit Summary: (In Person-Declined) Patient declined AVS at this time.  Follow-up with PCP as scheduled. Referrals today: bone density.

## 2023-09-26 DIAGNOSIS — H401133 Primary open-angle glaucoma, bilateral, severe stage: Secondary | ICD-10-CM | POA: Diagnosis not present

## 2023-10-01 ENCOUNTER — Ambulatory Visit: Payer: Medicare Other

## 2023-10-01 DIAGNOSIS — M810 Age-related osteoporosis without current pathological fracture: Secondary | ICD-10-CM | POA: Diagnosis not present

## 2023-10-01 DIAGNOSIS — Z780 Asymptomatic menopausal state: Secondary | ICD-10-CM | POA: Diagnosis not present

## 2023-10-02 ENCOUNTER — Encounter: Payer: Self-pay | Admitting: Medical-Surgical

## 2023-10-02 ENCOUNTER — Other Ambulatory Visit: Payer: Self-pay | Admitting: Medical-Surgical

## 2023-10-02 DIAGNOSIS — K219 Gastro-esophageal reflux disease without esophagitis: Secondary | ICD-10-CM

## 2023-10-02 DIAGNOSIS — E039 Hypothyroidism, unspecified: Secondary | ICD-10-CM

## 2023-10-02 MED ORDER — LEVOTHYROXINE SODIUM 75 MCG PO TABS: 75.0000 ug | ORAL_TABLET | Freq: Every day | ORAL | 1 refills | Status: DC

## 2023-10-04 ENCOUNTER — Encounter: Payer: Self-pay | Admitting: Medical-Surgical

## 2023-11-04 ENCOUNTER — Ambulatory Visit (INDEPENDENT_AMBULATORY_CARE_PROVIDER_SITE_OTHER): Payer: Medicare Other | Admitting: Sports Medicine

## 2023-11-04 DIAGNOSIS — R300 Dysuria: Secondary | ICD-10-CM | POA: Diagnosis not present

## 2023-11-04 DIAGNOSIS — R6889 Other general symptoms and signs: Secondary | ICD-10-CM

## 2023-11-04 DIAGNOSIS — R509 Fever, unspecified: Secondary | ICD-10-CM | POA: Diagnosis not present

## 2023-11-04 LAB — POCT URINALYSIS DIP (CLINITEK)
Bilirubin, UA: NEGATIVE
Glucose, UA: NEGATIVE mg/dL
Nitrite, UA: POSITIVE — AB
POC PROTEIN,UA: 30 — AB
Spec Grav, UA: 1.02 (ref 1.010–1.025)
Urobilinogen, UA: 2 U/dL — AB
pH, UA: 7 (ref 5.0–8.0)

## 2023-11-04 LAB — POCT INFLUENZA A/B
Influenza A, POC: NEGATIVE
Influenza B, POC: NEGATIVE

## 2023-11-04 LAB — POCT RAPID STREP A (OFFICE): Rapid Strep A Screen: NEGATIVE

## 2023-11-04 LAB — POC COVID19 BINAXNOW: SARS Coronavirus 2 Ag: NEGATIVE

## 2023-11-04 MED ORDER — NITROFURANTOIN MONOHYD MACRO 100 MG PO CAPS: 100.0000 mg | ORAL_CAPSULE | Freq: Two times a day (BID) | ORAL | 0 refills | Status: DC

## 2023-11-04 NOTE — Progress Notes (Signed)
    Procedures performed today:    None.  Independent interpretation of notes and tests performed by another provider:   None.  Brief History, Exam, Impression, and Recommendations:    Fever, low grade Very pleasant 78 year old female, she has had about 4 days of achiness, fatigue, mild cough, nasal stuffiness. She has also had urinary urgency, frequency, burning. Her oropharyngeal exam is normal, lungs are clear. Abdomen is soft, nontender. COVID, flu, strep test were negative today. Urinalysis was positive for blood, leukocytes, nitrites, we will culture the urine, calling in Macrobid. Return to see me as needed.  I spent 30 minutes of total time managing this patient today, this includes chart review, face to face, and non-face to face time.  ____________________________________________ Ihor Austin. Benjamin Stain, M.D., ABFM., CAQSM., AME. Primary Care and Sports Medicine [facility]  Adjunct Professor of Family Medicine  [employer]  Restaurant manager, fast food

## 2023-11-04 NOTE — Assessment & Plan Note (Signed)
 Very pleasant 78 year old female, she has had about 4 days of achiness, fatigue, mild cough, nasal stuffiness. She has also had urinary urgency, frequency, burning. Her oropharyngeal exam is normal, lungs are clear. Abdomen is soft, nontender. COVID, flu, strep test were negative today. Urinalysis was positive for blood, leukocytes, nitrites, we will culture the urine, calling in Macrobid. Return to see me as needed.

## 2023-11-07 ENCOUNTER — Ambulatory Visit: Payer: Medicare Other | Admitting: Medical-Surgical

## 2023-11-08 LAB — URINE CULTURE

## 2023-11-11 ENCOUNTER — Ambulatory Visit (INDEPENDENT_AMBULATORY_CARE_PROVIDER_SITE_OTHER): Payer: Medicare Other | Admitting: Medical-Surgical

## 2023-11-11 ENCOUNTER — Encounter: Payer: Self-pay | Admitting: Medical-Surgical

## 2023-11-11 VITALS — BP 130/83 | HR 73 | Temp 97.9°F | Resp 20 | Ht 64.0 in | Wt 134.0 lb

## 2023-11-11 DIAGNOSIS — J329 Chronic sinusitis, unspecified: Secondary | ICD-10-CM

## 2023-11-11 DIAGNOSIS — F430 Acute stress reaction: Secondary | ICD-10-CM

## 2023-11-11 DIAGNOSIS — I10 Essential (primary) hypertension: Secondary | ICD-10-CM

## 2023-11-11 DIAGNOSIS — F411 Generalized anxiety disorder: Secondary | ICD-10-CM

## 2023-11-11 DIAGNOSIS — K219 Gastro-esophageal reflux disease without esophagitis: Secondary | ICD-10-CM | POA: Diagnosis not present

## 2023-11-11 DIAGNOSIS — J40 Bronchitis, not specified as acute or chronic: Secondary | ICD-10-CM | POA: Diagnosis not present

## 2023-11-11 DIAGNOSIS — I2510 Atherosclerotic heart disease of native coronary artery without angina pectoris: Secondary | ICD-10-CM

## 2023-11-11 MED ORDER — METHYLPREDNISOLONE 4 MG PO TBPK: ORAL_TABLET | ORAL | 0 refills | Status: DC

## 2023-11-11 MED ORDER — NIFEDIPINE ER 30 MG PO TB24: 30.0000 mg | ORAL_TABLET | Freq: Every day | ORAL | 3 refills | Status: DC

## 2023-11-11 MED ORDER — ALBUTEROL SULFATE HFA 108 (90 BASE) MCG/ACT IN AERS: 2.0000 | INHALATION_SPRAY | Freq: Four times a day (QID) | RESPIRATORY_TRACT | 11 refills | Status: AC | PRN

## 2023-11-11 MED ORDER — ESCITALOPRAM OXALATE 5 MG PO TABS: 5.0000 mg | ORAL_TABLET | Freq: Every day | ORAL | 3 refills | Status: DC

## 2023-11-11 MED ORDER — AMOXICILLIN-POT CLAVULANATE 875-125 MG PO TABS: 1.0000 | ORAL_TABLET | Freq: Two times a day (BID) | ORAL | 0 refills | Status: DC

## 2023-11-11 MED ORDER — ESOMEPRAZOLE MAGNESIUM 40 MG PO CPDR: 40.0000 mg | DELAYED_RELEASE_CAPSULE | Freq: Every day | ORAL | 3 refills | Status: DC

## 2023-11-11 MED ORDER — HYDROCHLOROTHIAZIDE 25 MG PO TABS: ORAL_TABLET | ORAL | 1 refills | Status: DC

## 2023-11-11 MED ORDER — ROSUVASTATIN CALCIUM 20 MG PO TABS: 20.0000 mg | ORAL_TABLET | Freq: Every day | ORAL | 3 refills | Status: DC

## 2023-11-11 MED ORDER — BENZONATATE 100 MG PO CAPS: 100.0000 mg | ORAL_CAPSULE | Freq: Three times a day (TID) | ORAL | 0 refills | Status: DC | PRN

## 2023-11-11 MED ORDER — PROMETHAZINE-DM 6.25-15 MG/5ML PO SYRP: 5.0000 mL | ORAL_SOLUTION | Freq: Four times a day (QID) | ORAL | 0 refills | Status: DC | PRN

## 2023-11-11 NOTE — Progress Notes (Unsigned)
   Established patient visit  History, exam, impression, and plan:  No problem-specific Assessment & Plan notes found for this encounter.   ROS  Physical Exam  Procedures performed this visit: None.  No follow-ups on file.  __________________________________ Thayer Ohm, DNP, APRN, FNP-BC Primary Care and Sports Medicine [facility]

## 2023-11-12 ENCOUNTER — Encounter: Payer: Self-pay | Admitting: Medical-Surgical

## 2023-11-25 ENCOUNTER — Encounter: Payer: Self-pay | Admitting: Medical-Surgical

## 2023-12-01 DIAGNOSIS — H401133 Primary open-angle glaucoma, bilateral, severe stage: Secondary | ICD-10-CM | POA: Diagnosis not present

## 2023-12-01 DIAGNOSIS — Z961 Presence of intraocular lens: Secondary | ICD-10-CM | POA: Diagnosis not present

## 2024-01-19 ENCOUNTER — Ambulatory Visit: Payer: Medicare Other | Attending: Radiation Oncology

## 2024-01-19 VITALS — Wt 136.0 lb

## 2024-01-19 DIAGNOSIS — Z483 Aftercare following surgery for neoplasm: Secondary | ICD-10-CM | POA: Insufficient documentation

## 2024-01-19 NOTE — Therapy (Signed)
 OUTPATIENT PHYSICAL THERAPY SOZO SCREENING NOTE   Patient Name: Cindy [name] MRN: [id number] DOB:[birth year], 78 y.o., female Today's Date: 01/19/2024  PCP: Cherre Cornish, NP REFERRING PROVIDER: Colie Dawes, MD   PT End of Session - 01/19/24 1004     Visit Number 12   # unchanged due to screen only   PT Start Time 1002    PT Stop Time 1007    PT Time Calculation (min) 5 min    Activity Tolerance Patient tolerated treatment well    Behavior During Therapy [facility] for tasks assessed/performed             Past Medical History:  Diagnosis Date   Abnormal mammogram    Allergic rhinitis 10/30/2018   Allergy    Tetracycoline. Sulfa   Anxiety in acute stress reaction 10/30/2018   Arthritis    Atypical chest pain 10/30/2018   Cancer [facility])    breast    Cataract    Both eyes surgery   Chest pain    "not cardiac related"   Colon polyps    Dyspnea    DOE   Family history of breast cancer 07/05/2020   Family history of pancreatic cancer 07/05/2020   Family history of prostate cancer 07/05/2020   Genetic testing 07/11/2020   GERD (gastroesophageal reflux disease) 10/30/2018   History of kidney stones    passed   Hypertension 10/30/2018   Hyperthyroidism    Hypothyroidism 10/30/2018   Migraine 10/30/2018   Osteoporosis 10/30/2018   Personal history of chemotherapy    Personal history of radiation therapy    Pneumonia    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   Vitamin D  deficiency 10/30/2018   Past Surgical History:  Procedure Laterality Date   ABDOMINAL HYSTERECTOMY     BREAST BIOPSY Left 06/26/2020   x2   BREAST LUMPECTOMY Left 08/15/2020   BREAST LUMPECTOMY WITH RADIOACTIVE SEED AND SENTINEL LYMPH NODE BIOPSY Left 08/15/2020   Procedure: LEFT BREAST LUMPECTOMY WITH RADIOACTIVE SEED AND SENTINEL LYMPH NODE BIOPSY;  Surgeon: Lockie Rima, MD;  Location: [facility] OR;  Service: General;  Laterality: Left;  RNFA   COLONOSCOPY W/ POLYPECTOMY     ESOPHAGEAL DILATION      EYE SURGERY Bilateral    catheter with lens   HYSTERECTOMY ABDOMINAL WITH SALPINGECTOMY     KNEE SURGERY Right 09/13/2022   PORT-A-CATH REMOVAL  01/04/2021   Procedure: REMOVAL PORT-A-CATH;  Surgeon: Lockie Rima, MD;  Location: [facility] [place];  Service: General;;   PORTACATH PLACEMENT Right 08/15/2020   Procedure: INSERTION PORT-A-CATH;  Surgeon: Lockie Rima, MD;  Location: [facility] OR;  Service: General;  Laterality: Right;   TONSILLECTOMY     TUBAL LIGATION     Patient Active Problem List   Diagnosis Date Noted   Fever, low grade 11/04/2023   Lumbar spondylosis 03/04/2023   Port-A-Cath in place 09/12/2020   Encounter for Medicare annual wellness exam 07/11/2020   Malignant neoplasm of upper-outer quadrant of left breast in female, estrogen receptor negative (HCC) 06/30/2020   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/23/2019   Primary osteoarthritis of both hands 06/23/2019   Trigger thumb of left hand 06/23/2019   Polyarthralgia 06/23/2019   Hypertension 10/30/2018   Atypical chest pain 10/30/2018   Migraine 10/30/2018   Hypothyroidism 10/30/2018   Osteoporosis 10/30/2018   GERD (gastroesophageal reflux disease) 10/30/2018   Allergic rhinitis 10/30/2018   Vitamin D  deficiency 10/30/2018    REFERRING DIAG: left breast cancer at risk for lymphedema  THERAPY DIAG:  Aftercare following surgery for neoplasm  PERTINENT HISTORY: Patient was diagnosed on 05/10/2020 with 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 She had left lumpectomy on 08/15/2020 wth 2 negative nodes removed. Ki67 is 80%. Radiation complete   PRECAUTIONS: left UE Lymphedema risk, None  SUBJECTIVE: Pt returns for her 6 month L-Dex screen.   PAIN:  Are you having pain? No  SOZO SCREENING: Patient was assessed today using the SOZO machine to determine the lymphedema index score. This was compared to her baseline score. It was determined that she is within the recommended  range when compared to her baseline and no further action is needed at this time. She will continue SOZO screenings. These are done every 3 months for 2 years post operatively followed by every 6 months for 2 years, and then annually.    L-DEX FLOWSHEETS - 01/19/24 1000       L-DEX LYMPHEDEMA SCREENING   Measurement Type Unilateral    L-DEX MEASUREMENT EXTREMITY Upper Extremity    POSITION  Standing    DOMINANT SIDE Right    At Risk Side Left    BASELINE SCORE (UNILATERAL) 3.9    L-DEX SCORE (UNILATERAL) 1.5    VALUE CHANGE (UNILAT) -2.4            P: May want to end after next screen.    Denyce Flank, PTA 01/19/2024, 10:07 AM

## 2024-01-22 ENCOUNTER — Ambulatory Visit: Payer: Self-pay

## 2024-01-22 NOTE — Telephone Encounter (Signed)
 Copied from CRM [id number]. Topic: Clinical - Red Word Triage >> Jan 22, 2024 12:38 PM Tiffany H wrote: Patient called to advise that she was taking AZO to manage symptoms of UTI due to other health problems. Patient took AZO for a week, but then stopped when UTI symptoms had cleared. Now, four days later, symptoms are returning. Please advise. Patient would like urgent visit.  Chief Complaint: painful urination, frequency Symptoms: see above Frequency: constant Pertinent Negatives: Patient denies fever, back pain, flank pain Disposition: [] ED /[] Urgent Care (no appt availability in office) / [x] Appointment(In office/virtual)/ []  [facility]/ [] Home Care/ [] Refused Recommended Disposition /[] [facility]/ []  Follow-up with PCP Additional Notes: apt made for tomorrow per protocol; care advice given, denies questions; instructed to go to ER if becomes worse.   Reason for Disposition  Urinating more frequently than usual (i.e., frequency)  Answer Assessment - Initial Assessment Questions 1. SYMPTOM: "What's the main symptom you're concerned about?" (e.g., frequency, incontinence)     Burning with urination, frequency,  2. ONSET: "When did the  symptoms  start?"     A week ago and took AZO for a week, stopped four days ago and symptoms are back 3. PAIN: "Is there any pain?" If Yes, ask: "How bad is it?" (Scale: 1-10; mild, moderate, severe)     moderated 4. CAUSE: "What do you think is causing the symptoms?"    UTI 5. OTHER SYMPTOMS: "Do you have any other symptoms?" (e.g., blood in urine, fever, flank pain, pain with urination)     pain 6. PREGNANCY: "Is there any chance you are pregnant?" "When was your last menstrual period?"     na  Protocols used: Urinary Symptoms-A-AH

## 2024-01-23 ENCOUNTER — Encounter: Payer: Self-pay | Admitting: Medical-Surgical

## 2024-01-23 ENCOUNTER — Ambulatory Visit (INDEPENDENT_AMBULATORY_CARE_PROVIDER_SITE_OTHER): Admitting: Medical-Surgical

## 2024-01-23 VITALS — BP 135/83 | HR 60 | Resp 20 | Wt 138.0 lb

## 2024-01-23 DIAGNOSIS — I10 Essential (primary) hypertension: Secondary | ICD-10-CM | POA: Diagnosis not present

## 2024-01-23 DIAGNOSIS — N390 Urinary tract infection, site not specified: Secondary | ICD-10-CM

## 2024-01-23 DIAGNOSIS — R300 Dysuria: Secondary | ICD-10-CM

## 2024-01-23 LAB — POCT URINALYSIS DIP (CLINITEK)
Bilirubin, UA: NEGATIVE
Glucose, UA: NEGATIVE mg/dL
Ketones, POC UA: NEGATIVE mg/dL
Nitrite, UA: POSITIVE — AB
POC PROTEIN,UA: NEGATIVE
Spec Grav, UA: 1.015 (ref 1.010–1.025)
Urobilinogen, UA: 0.2 U/dL
pH, UA: 6 (ref 5.0–8.0)

## 2024-01-23 MED ORDER — NITROFURANTOIN MONOHYD MACRO 100 MG PO CAPS: 100.0000 mg | ORAL_CAPSULE | Freq: Two times a day (BID) | ORAL | 0 refills | Status: DC

## 2024-01-23 NOTE — Progress Notes (Signed)
        Established patient visit  History, exam, impression, and plan:  1. Dysuria (Primary) Pleasant 78 year old female presenting today with complaints of dysuria.  She had a couple of episodes of diarrhea over the last week or 2 followed by some dysuria.  She took an Azo which was helpful.  After that her urinary symptoms resolved.  She took her last Azo dose about a week ago and notes that her symptoms came back yesterday with urgency, pain, and frequency.  Notes that she has a horrible urinary odor.  No fever, chills, nausea, vomiting, back/flank pain.  POCT urinalysis positive for nitrites and small leukocytes with trace lysed blood.  Sending for culture. - POCT URINALYSIS DIP (CLINITEK) - Urine Culture  2. Recurrent UTI Urinalysis done today consistent with acute cystitis with hematuria.  Looking at her last culture, she grew out Staphylococcus epidermis which is unusual.  The sensitivity report showed multidrug resistance.  It was sensitive to Macrobid  so we will go ahead and proceed with nitrofurantoin  100 mg twice daily x 5 days.  Because of her recurrent symptoms over the past 6 to 12 months, feel that she would benefit from further evaluation.  She does note that she has difficulty urinating unless she bends forward over her knees while seated on the toilet.  Has had a bladder sling/tack in the past but has not followed up with an OB/GYN since moving here.  Not a candidate for topical estrogens due to history of cancer.  Referring to urogynecology for further evaluation. - Ambulatory referral to Urogynecology  2.  Hypertension Blood pressure elevated 166/83 today.  She endorses that she stopped her blood pressure medications over the last week or 2 because she had a couple of days when her blood pressure was very low with systolics in the 90s and that she was symptomatic.  She has not resumed her blood pressure meds because she was worried about it running low again.  On evaluation, she  was having diarrhea and was likely dehydrated which contributed to the hypotension.  Blood pressure remains elevated on recheck.  Would recommend that she restart her blood pressure medications as soon as possible.  Monitor blood pressure at home with a goal of 130/80 or less.  Procedures performed this visit: None.  Return if symptoms worsen or fail to improve.  __________________________________ Maryl Snook, DNP, APRN, FNP-BC Primary Care and Sports Medicine [facility]

## 2024-01-27 ENCOUNTER — Ambulatory Visit: Payer: Self-pay | Admitting: Medical-Surgical

## 2024-01-27 LAB — URINE CULTURE

## 2024-02-19 ENCOUNTER — Telehealth: Payer: Self-pay | Admitting: Medical-Surgical

## 2024-02-19 DIAGNOSIS — M810 Age-related osteoporosis without current pathological fracture: Secondary | ICD-10-CM

## 2024-02-19 DIAGNOSIS — N390 Urinary tract infection, site not specified: Secondary | ICD-10-CM

## 2024-02-19 DIAGNOSIS — I10 Essential (primary) hypertension: Secondary | ICD-10-CM

## 2024-02-19 NOTE — Telephone Encounter (Signed)
 Copied from CRM [id number]. Topic: Referral - Status >> Feb 19, 2024 12:00 PM Tiffany H wrote: Reason for CRM: Patient would like a new referral for Urogynocologist. Please assist.

## 2024-02-19 NOTE — Telephone Encounter (Signed)
 Copied from CRM [id number]. Topic: Clinical - Request for Lab/Test Order >> Feb 19, 2024 11:55 AM Tiffany H wrote: Reason for CRM: Patient called to schedule labs for Prolia  injection. Patient is no longer getting Prolia  injections from cancer treatment center. Joy advised that patient can transfer that treatment from cancer center to us .   Patient needs to schedule labs - please place order for Comprehensive Metabolic panel and urine culture so that patient can have Prolia  injection done at the clinic.   Also:   Urine culture requested. Patient advised that she still has productive urine urgency and would like a culture to see where she is in terms of fighting UTI.

## 2024-02-24 ENCOUNTER — Telehealth: Payer: Self-pay

## 2024-02-24 ENCOUNTER — Encounter: Payer: Self-pay | Admitting: Sports Medicine

## 2024-02-24 DIAGNOSIS — M810 Age-related osteoporosis without current pathological fracture: Secondary | ICD-10-CM

## 2024-02-24 NOTE — Telephone Encounter (Signed)
 Patient due for  Prolia  injection  Transitioning care from Oncology  Will need PA done   Also forwarding message to Dr. Augustus Ledger Just FYI  Spoke with patient - she will come in for lab work to be drawn while awaiting the prior authorization to be done

## 2024-02-24 NOTE — Telephone Encounter (Signed)
 Prolia  VOB initiated via [email or web address]  Next Prolia  inj DUE: 02/27/24

## 2024-02-24 NOTE — Telephone Encounter (Signed)
 Spoke with patient - she will come in for lab work while awaiting PA be done

## 2024-02-25 LAB — CMP14+EGFR
ALT: 14 [IU]/L (ref 0–32)
AST: 23 [IU]/L (ref 0–40)
Albumin: 4.3 g/dL (ref 3.8–4.8)
Alkaline Phosphatase: 73 [IU]/L (ref 44–121)
BUN/Creatinine Ratio: 19 (ref 12–28)
BUN: 18 mg/dL (ref 8–27)
Bilirubin Total: 0.5 mg/dL (ref 0.0–1.2)
CO2: 22 mmol/L (ref 20–29)
Calcium: 9.3 mg/dL (ref 8.7–10.3)
Chloride: 107 mmol/L — ABNORMAL HIGH (ref 96–106)
Creatinine, Ser: 0.97 mg/dL (ref 0.57–1.00)
Globulin, Total: 2.2 g/dL (ref 1.5–4.5)
Glucose: 94 mg/dL (ref 70–99)
Potassium: 5.3 mmol/L — ABNORMAL HIGH (ref 3.5–5.2)
Sodium: 144 mmol/L (ref 134–144)
Total Protein: 6.5 g/dL (ref 6.0–8.5)
eGFR: 59 mL/min/{1.73_m2} — ABNORMAL LOW

## 2024-02-25 NOTE — Telephone Encounter (Signed)
 Pt ready for scheduling for PROLIA  on or after : 02/25/24  Option# 1: Buy/Bill (Office supplied medication)  Out-of-pocket cost due at time of clinic visit: $0  Number of injection/visits approved: ---  Primary: MEDICARE Prolia  co-insurance: 0% Admin fee co-insurance: 0%  Secondary: [employer] Prolia  co-insurance:  Admin fee co-insurance:   Medical Benefit Details: Date Benefits were checked: 02/25/24 Deductible: $257 Met of $257 Required/ Coinsurance: 0%/ Admin Fee: 0%  Prior Auth: N/A PA# Expiration Date:   # of doses approved: ----------------------------------------------------------------------- Option# 2- Med Obtained from pharmacy:  Pharmacy benefit: Copay $--- (Paid to pharmacy) Admin Fee: --- (Pay at clinic)  Prior Auth: --- PA# Expiration Date:   # of doses approved:   If patient wants fill through the pharmacy benefit please send prescription to: ---, and include estimated need by date in rx notes. Pharmacy will ship medication directly to the office.  Patient NOT eligible for Prolia  Copay Card. Copay Card can make patient's cost as little as $25. Link to apply: https://www.amgensupportplus.com/copay  ** This summary of benefits is an estimation of the patient's out-of-pocket cost. Exact cost may very based on individual plan coverage.

## 2024-02-25 NOTE — Telephone Encounter (Signed)
 Cindy [name]

## 2024-02-26 NOTE — Addendum Note (Signed)
 Addended by: Sylvan Sookdeo E on: 02/26/2024 04:34 PM   Modules accepted: Orders

## 2024-03-05 DIAGNOSIS — N390 Urinary tract infection, site not specified: Secondary | ICD-10-CM | POA: Diagnosis not present

## 2024-03-05 MED ORDER — DENOSUMAB 60 MG/ML ~~LOC~~ SOSY: 60.0000 mg | PREFILLED_SYRINGE | SUBCUTANEOUS | Status: AC

## 2024-03-05 NOTE — Telephone Encounter (Signed)
 Spoke with patient - lab work resulted for [facility] but not yet reviewed by provider. Patient is requesting these results.    Spoke with patient . Scheduled for 03/10/24-  pulled prolia  from refrigerator and attached patient name

## 2024-03-05 NOTE — Addendum Note (Signed)
 Addended by: Darlena Koval L on: 03/05/2024 12:08 PM   Modules accepted: Orders

## 2024-03-05 NOTE — Addendum Note (Signed)
 Addended by: Walburga Hudman L on: 03/05/2024 05:31 PM   Modules accepted: Orders

## 2024-03-09 ENCOUNTER — Ambulatory Visit: Payer: Self-pay | Admitting: Family Medicine

## 2024-03-09 LAB — URINE CULTURE

## 2024-03-09 MED ORDER — CEPHALEXIN 250 MG PO CAPS: 250.0000 mg | ORAL_CAPSULE | Freq: Four times a day (QID) | ORAL | 0 refills | Status: AC

## 2024-03-09 NOTE — Telephone Encounter (Signed)
 Spoke with [facility] - was told that the specimen was received on 03/05/2024 under Cindy [name] - has a 2 to 4 business day turnaround. She states she shows that this is still in process.

## 2024-03-10 ENCOUNTER — Ambulatory Visit (INDEPENDENT_AMBULATORY_CARE_PROVIDER_SITE_OTHER)

## 2024-03-10 VITALS — BP 121/71 | HR 63 | Ht 64.0 in

## 2024-03-10 DIAGNOSIS — M810 Age-related osteoporosis without current pathological fracture: Secondary | ICD-10-CM | POA: Diagnosis not present

## 2024-03-10 MED ORDER — DENOSUMAB 60 MG/ML ~~LOC~~ SOSY
60.0000 mg | PREFILLED_SYRINGE | Freq: Once | SUBCUTANEOUS | Status: AC
  Administered 2024-03-10: 60 mg via SUBCUTANEOUS

## 2024-03-10 MED ORDER — DENOSUMAB 60 MG/ML ~~LOC~~ SOSY
60.0000 mg | PREFILLED_SYRINGE | SUBCUTANEOUS | Status: AC
  Administered 2024-09-17: 60 mg via SUBCUTANEOUS

## 2024-03-10 NOTE — Patient Instructions (Signed)
 Return in 6 months and one day for next prolia  injection as nurse visit.

## 2024-03-10 NOTE — Progress Notes (Signed)
   Established Patient Office Visit  Subjective   [name], female    DOB: Apr 14, 1946  Age: 78 y.o. 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     HPI  Age related Osteoporosis without current pathological fracture- Prolia  injection nurse visit. Patient verified that she is continuing to take VitD and calcium  supplements daily. Lab work acceptable to continue injection per Zada Palin, NP . Patient is buy and bill ( office supplied  medication )   ROS    Objective:     BP 121/71   Pulse 63   Ht 5' 4 (1.626 m)   SpO2 97%   BMI 23.69 kg/m    Physical Exam   No results found for any visits on 03/10/24.    The ASCVD Risk score (Arnett DK, et al., 2019) failed to calculate for the following reasons:   The 2019 ASCVD risk score is only valid for ages 38 to 32    Assessment & Plan:  Admin prolia  60mg   SQ Left arm . Patient tolerated injection well without complications.  Patient will return in 6 months and one day for next Prolia  injection as nurse visit.  Problem List Items Addressed This Visit       Musculoskeletal and Integument   Osteoporosis - Primary   Relevant Medications   denosumab  (PROLIA ) injection 60 mg (Start on 09/06/2024 12:00 AM)    Return in about 6 months (around 09/11/2024) for next prolia  injection as nurse visit. SABRA Suzen SHAUNNA Alpheus, LPN

## 2024-03-11 NOTE — Telephone Encounter (Signed)
 This request has been handled. Patient has been advised of results/recommendations. Verbalized understanding. Per patient, she started taking the cephalexin  on 03/10/24. Patient is aware to contact the clinic if symptoms persists. No further action is required.

## 2024-03-17 ENCOUNTER — Encounter: Payer: Self-pay | Admitting: Medical-Surgical

## 2024-03-17 DIAGNOSIS — E039 Hypothyroidism, unspecified: Secondary | ICD-10-CM

## 2024-03-18 NOTE — Telephone Encounter (Signed)
 Last TSH 05/07/2024= 3.650

## 2024-03-22 ENCOUNTER — Encounter: Payer: Self-pay | Admitting: Medical-Surgical

## 2024-03-24 ENCOUNTER — Encounter: Payer: Self-pay | Admitting: Medical-Surgical

## 2024-03-25 ENCOUNTER — Encounter: Payer: Self-pay | Admitting: Medical-Surgical

## 2024-03-25 ENCOUNTER — Ambulatory Visit (INDEPENDENT_AMBULATORY_CARE_PROVIDER_SITE_OTHER): Admitting: Medical-Surgical

## 2024-03-25 VITALS — BP 126/72 | HR 57 | Resp 20 | Ht 64.0 in | Wt 141.0 lb

## 2024-03-25 DIAGNOSIS — L659 Nonscarring hair loss, unspecified: Secondary | ICD-10-CM | POA: Diagnosis not present

## 2024-03-25 DIAGNOSIS — M810 Age-related osteoporosis without current pathological fracture: Secondary | ICD-10-CM | POA: Diagnosis not present

## 2024-03-25 DIAGNOSIS — N390 Urinary tract infection, site not specified: Secondary | ICD-10-CM | POA: Insufficient documentation

## 2024-03-25 DIAGNOSIS — E039 Hypothyroidism, unspecified: Secondary | ICD-10-CM | POA: Diagnosis not present

## 2024-03-25 LAB — POCT URINALYSIS DIP (CLINITEK)
Bilirubin, UA: NEGATIVE
Glucose, UA: NEGATIVE mg/dL
Ketones, POC UA: NEGATIVE mg/dL
Nitrite, UA: POSITIVE — AB
POC PROTEIN,UA: NEGATIVE
Spec Grav, UA: 1.015 (ref 1.010–1.025)
Urobilinogen, UA: 0.2 U/dL
pH, UA: 7.5 (ref 5.0–8.0)

## 2024-03-25 MED ORDER — CEFDINIR 300 MG PO CAPS
300.0000 mg | ORAL_CAPSULE | Freq: Two times a day (BID) | ORAL | 0 refills | Status: DC
Start: 2024-03-25 — End: 2024-04-07

## 2024-03-25 MED ORDER — CEFTRIAXONE SODIUM 1 G IJ SOLR
1.0000 g | Freq: Once | INTRAMUSCULAR | Status: AC
  Administered 2024-03-25: 1 g via INTRAMUSCULAR

## 2024-03-25 NOTE — Progress Notes (Signed)
        Established patient visit  Discussed the use of AI scribe software for clinical note transcription with the patient, who gave verbal consent to proceed.  History of Present Illness   Cindy [name] is an 78 year old female with recurrent urinary tract infections who presents with ongoing symptoms despite treatment.  Recurrent urinary tract infection symptoms - Recurrent urinary tract infections since January, with episodes approximately every month since May - Persistent urinary discomfort and unusual urine odor - No pelvic pain or pressure - Leans forward to empty bladder completely  Antibiotic treatment history and allergies - Received antibiotic treatment with Keflex  four times daily for four or five days in June - Allergic to Bactrim (causes rash) and tetracyclines - No history of severe allergic reactions such as difficulty breathing - Questions sufficiency of treatment duration     Hypothyroidism - Taking levothyroxine  75mcg daily, compliant with dosing - Last TSH 3.650 (05/08/2023)   Physical Exam Vitals reviewed.  Constitutional:      General: She is not in acute distress.    Appearance: Normal appearance. She is not ill-appearing.  HENT:     Head: Normocephalic and atraumatic.     Comments: Hair notably thinner on the right side compared to the left. Cardiovascular:     Rate and Rhythm: Normal rate and regular rhythm.     Pulses: Normal pulses.     Heart sounds: Normal heart sounds. No murmur heard.    No friction rub. No gallop.  Pulmonary:     Effort: Pulmonary effort is normal. No respiratory distress.     Breath sounds: Normal breath sounds. No wheezing.  Skin:    General: Skin is warm and dry.  Neurological:     Mental Status: She is alert and oriented to person, place, and time.  Psychiatric:        Mood and Affect: Mood normal.        Behavior: Behavior normal.        Thought Content: Thought content normal.        Judgment: Judgment  normal.     Assessment and Plan    Recurrent Urinary Tract Infections (UTIs) Recurrent UTIs with positive nitrites, leukocytes, and trace hematuria. E. coli sensitive to multiple antibiotics on the last two cultures. Allergic to Bactrim and tetracyclines. Previous treatments may have been insufficient in duration. - Administer ceftriaxone  1 gram intramuscularly in the clinic. - Prescribe Cefdinir  300mg  BID x 7 days following the ceftriaxone  injection. - Continue with the urogynecology appointment scheduled for October.  Hair Loss Hair loss noted, similar to previous loss during cancer treatment. Investigate thyroid  function, blood counts, iron, ferritin, and vitamin D  levels. - Order blood tests to check thyroid  function, blood counts, iron, ferritin, and vitamin D  levels.  Osteoporosis Osteoporosis management includes monitoring vitamin D  levels. - Continue vitamin D  and calcium  supplementation.     Hypothyroidism - Checking TSH and free T4 - Continue levothyroxine   Return if symptoms worsen or fail to improve.  __________________________________ Zada FREDRIK Palin, DNP, APRN, FNP-BC Primary Care and Sports Medicine [facility]

## 2024-03-26 ENCOUNTER — Ambulatory Visit: Payer: Self-pay | Admitting: Medical-Surgical

## 2024-03-26 DIAGNOSIS — E039 Hypothyroidism, unspecified: Secondary | ICD-10-CM

## 2024-03-26 MED ORDER — LEVOTHYROXINE SODIUM 88 MCG PO TABS: 88.0000 ug | ORAL_TABLET | Freq: Every day | ORAL | 3 refills | Status: AC

## 2024-03-28 LAB — URINE CULTURE

## 2024-03-30 LAB — CBC WITH DIFFERENTIAL/PLATELET
Basophils Absolute: 0 x10E3/uL (ref 0.0–0.2)
Basos: 1 %
EOS (ABSOLUTE): 0.1 x10E3/uL (ref 0.0–0.4)
Eos: 2 %
Hematocrit: 46.9 % — ABNORMAL HIGH (ref 34.0–46.6)
Hemoglobin: 15 g/dL (ref 11.1–15.9)
Immature Grans (Abs): 0 x10E3/uL (ref 0.0–0.1)
Immature Granulocytes: 0 %
Lymphocytes Absolute: 1.6 x10E3/uL (ref 0.7–3.1)
Lymphs: 29 %
MCH: 31.4 pg (ref 26.6–33.0)
MCHC: 32 g/dL (ref 31.5–35.7)
MCV: 98 fL — ABNORMAL HIGH (ref 79–97)
Monocytes Absolute: 0.6 x10E3/uL (ref 0.1–0.9)
Monocytes: 10 %
Neutrophils Absolute: 3.2 x10E3/uL (ref 1.4–7.0)
Neutrophils: 58 %
Platelets: 194 x10E3/uL (ref 150–450)
RBC: 4.78 x10E6/uL (ref 3.77–5.28)
RDW: 12.1 % (ref 11.7–15.4)
WBC: 5.5 x10E3/uL (ref 3.4–10.8)

## 2024-03-30 LAB — T4, FREE: Free T4: 1.25 ng/dL (ref 0.82–1.77)

## 2024-03-30 LAB — IRON,TIBC AND FERRITIN PANEL
Ferritin: 47 ng/mL (ref 15–150)
Iron Saturation: 35 % (ref 15–55)
Iron: 126 ug/dL (ref 27–139)
Total Iron Binding Capacity: 358 ug/dL (ref 250–450)
UIBC: 232 ug/dL (ref 118–369)

## 2024-03-30 LAB — HEAVY METALS PROFILE II, BLOOD
Arsenic: 1 ug/L (ref 0–9)
Cadmium: 0.5 ug/L (ref 0.0–1.2)
Lead, Blood: <1 ug/dL (ref 0.0–3.4)
Mercury: <1 ug/L (ref 0.0–14.9)

## 2024-03-30 LAB — VITAMIN D 25 HYDROXY (VIT D DEFICIENCY, FRACTURES): Vit D, 25-Hydroxy: 46.9 ng/mL (ref 30.0–100.0)

## 2024-03-30 LAB — TSH: TSH: 4.04 u[IU]/mL (ref 0.450–4.500)

## 2024-04-01 DIAGNOSIS — H401133 Primary open-angle glaucoma, bilateral, severe stage: Secondary | ICD-10-CM | POA: Diagnosis not present

## 2024-04-01 DIAGNOSIS — Z961 Presence of intraocular lens: Secondary | ICD-10-CM | POA: Diagnosis not present

## 2024-04-07 ENCOUNTER — Encounter: Payer: Self-pay | Admitting: Obstetrics and Gynecology

## 2024-04-07 ENCOUNTER — Ambulatory Visit (INDEPENDENT_AMBULATORY_CARE_PROVIDER_SITE_OTHER): Admitting: Obstetrics and Gynecology

## 2024-04-07 VITALS — BP 125/85 | HR 71 | Ht 62.5 in | Wt 137.0 lb

## 2024-04-07 DIAGNOSIS — N390 Urinary tract infection, site not specified: Secondary | ICD-10-CM | POA: Diagnosis not present

## 2024-04-07 DIAGNOSIS — N8110 Cystocele, unspecified: Secondary | ICD-10-CM | POA: Insufficient documentation

## 2024-04-07 DIAGNOSIS — N3941 Urge incontinence: Secondary | ICD-10-CM | POA: Diagnosis not present

## 2024-04-07 DIAGNOSIS — Z87440 Personal history of urinary (tract) infections: Secondary | ICD-10-CM | POA: Diagnosis not present

## 2024-04-07 DIAGNOSIS — N952 Postmenopausal atrophic vaginitis: Secondary | ICD-10-CM

## 2024-04-07 MED ORDER — ESTRADIOL 0.1 MG/GM VA CREA: TOPICAL_CREAM | VAGINAL | 11 refills | Status: AC

## 2024-04-07 NOTE — Assessment & Plan Note (Signed)
-  We discussed the symptoms of overactive bladder (OAB), which include urinary urgency, urinary frequency, nocturia, with or without urge incontinence.  While we do not know the exact etiology of OAB, several treatment options exist. We discussed management including behavioral therapy (decreasing bladder irritants, urge suppression strategies, timed voids, bladder retraining), physical therapy, medication.  - Since symptoms are infrequent, we discussed increasing water intake and decreasing bladder irritants like soda. She is not currently interested in pelvic PT.

## 2024-04-07 NOTE — Progress Notes (Signed)
 New Patient Evaluation and Consultation  Referring Provider: Alvia Bring, DO PCP: Willo Mini, NP Date of Service: 04/07/2024  SUBJECTIVE Chief Complaint: New Patient (Initial Visit) Cindy [name] is a 78 y.o. female is here recurrent UTI.)  History of Present Illness: Cindy [name] is a 78 y.o. White or Caucasian female seen in consultation at the request of Dr Bring Alvia for evaluation of incontinence and recurrent UTI.     Urinary Symptoms: Leaks urine with cough/ sneeze and with urgency Does not leak every day, maybe once a week. Sometimes has urgency with running water. Rare SUI.  Pad use: none Patient is bothered by UI symptoms.  Day time voids 5+.  Nocturia: 1-2 times per night to void. Voiding dysfunction:  does not empty bladder well.  Patient does not use a catheter to empty bladder.  When urinating, patient feels to push on her belly or vagina to empty bladder Drinks: 1 16oz bottle water, 1- 3 small cans coke per day, occasional body armour or mcdonald's frappe  UTIs: several UTI's in the last year.  She does not feel her symptoms have resolved with the last round of antibiotics. She has pain and urgency. The pain occurs down in the vaginal area. Can occur with just sitting.  Has is not used vaginal estrogen- previously used some kind of suppository. She is not using anything for vaginal moisture.   Urine cultures:  03/25/24- >100,000 E.Coli (pansensitive)- treated with ceftriaxone  1g x1 and cefdinir  x 5 days 03/05/24- >100,000 E.Coli (pansensitive)- treated with keflex  01/23/24- >100,000 E.Coli (pansensitive)- treated with nitrofurantoin  11/04/23- >100,000 Staph epidermis (R to ciprofloxacin , oxacillin, bactrim)- treated with nitrofurantoin    Pelvic Organ Prolapse Symptoms:                  Patient Denies a feeling of a bulge the vaginal area.   Bowel Symptom: Bowel movements: inconsistent, sometimes every day, other times not for several days Stool  consistency: hard Straining: yes.  Splinting: no.  Incomplete evacuation: yes.  Admits to accidental bowel leakage / fecal incontinence  Occurs: not often - a few times a year  Consistency with leakage: solid Bowel regimen: stool softener (colace)- daily  Sexual Function Sexually active: no.    Pelvic Pain Denies pelvic pain   Past Medical History:  Past Medical History:  Diagnosis Date   Abnormal mammogram    Allergic rhinitis 10/30/2018   Allergy    Tetracycoline. Sulfa   Anxiety in acute stress reaction 10/30/2018   Arthritis    Atypical chest pain 10/30/2018   Cancer [facility])    breast    Cataract    Both eyes surgery   Chest pain    not cardiac related   Colon polyps    Dyspnea    DOE   Family history of breast cancer 07/05/2020   Family history of pancreatic cancer 07/05/2020   Family history of prostate cancer 07/05/2020   Genetic testing 07/11/2020   GERD (gastroesophageal reflux disease) 10/30/2018   History of kidney stones    passed   Hypertension 10/30/2018   Hypothyroidism 10/30/2018   Migraine 10/30/2018   Osteoporosis 10/30/2018   Personal history of chemotherapy    Personal history of radiation therapy    Pneumonia    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   Vitamin D  deficiency 10/30/2018     Past Surgical History:   Past Surgical History:  Procedure Laterality Date   ABDOMINAL HYSTERECTOMY     BREAST BIOPSY Left 06/26/2020   x2  BREAST LUMPECTOMY Left 08/15/2020   BREAST LUMPECTOMY WITH RADIOACTIVE SEED AND SENTINEL LYMPH NODE BIOPSY Left 08/15/2020   Procedure: LEFT BREAST LUMPECTOMY WITH RADIOACTIVE SEED AND SENTINEL LYMPH NODE BIOPSY;  Surgeon: Aron Shoulders, MD;  Location: [facility] OR;  Service: General;  Laterality: Left;  RNFA   COLONOSCOPY W/ POLYPECTOMY     ESOPHAGEAL DILATION     EYE SURGERY Bilateral    catheter with lens   HYSTERECTOMY ABDOMINAL WITH SALPINGECTOMY     KNEE SURGERY Right 09/13/2022   PORT-A-CATH  REMOVAL  01/04/2021   Procedure: REMOVAL PORT-A-CATH;  Surgeon: Aron Shoulders, MD;  Location: [facility];  Service: General;;   PORTACATH PLACEMENT Right 08/15/2020   Procedure: INSERTION PORT-A-CATH;  Surgeon: Aron Shoulders, MD;  Location: [facility] OR;  Service: General;  Laterality: Right;   TONSILLECTOMY     TUBAL LIGATION       Past OB/GYN History: OB History  Gravida Para Term Preterm AB Living  3 2 2  1 2   SAB IAB Ectopic Multiple Live Births  1        # Outcome Date GA Lbr Len/2nd Weight Sex Type Anes PTL Lv  3 SAB           2 Term      Vag-Spont     1 Term      Vag-Forceps       Menopausal: Denies vaginal bleeding since menopause Any history of abnormal pap smears: no.   Medications: Patient has a current medication list which includes the following prescription(s): albuterol , azelastine , denosumab , dorzolamide-timolol, escitalopram , esomeprazole , [START ON 04/08/2024] estradiol , fluticasone , hydrochlorothiazide , latanoprost, levothyroxine , nifedipine , nitroglycerin , rosuvastatin , and vitamin b-12, and the following Facility-Administered Medications: denosumab  and [START ON 09/06/2024] denosumab .   Allergies: Patient is allergic to sulfamethoxazole-trimethoprim and tetracyclines & related.   Social History:  Social History   Tobacco Use   Smoking status: Never   Smokeless tobacco: Never  Vaping Use   Vaping status: Never Used  Substance Use Topics   Alcohol use: Yes    Comment: rarely   Drug use: Never    Relationship status: married Patient lives with husband.   Patient is not employed. Regular exercise: No History of abuse: No  Family History:   Family History  Problem Relation Age of Onset   High blood pressure Mother    Breast cancer Mother        dx before 84   Alzheimer's disease Mother    Cancer Mother    Heart attack Father    Prostate cancer Brother 31   Breast cancer Maternal Aunt        dx 51s   Cancer Maternal Aunt    Breast  cancer Maternal Aunt        dx 4s   Breast cancer Maternal Grandmother        dx after 50   Cancer Maternal Grandmother    Leukemia Paternal Grandfather        dx 84s-80s   Pancreatic cancer Cousin        maternal; dx 94s   Cancer Maternal Aunt 50       unknown type   Breast cancer Cousin        paternal; dx 86s   Cancer Maternal Aunt    Diabetes Daughter      Review of Systems: Review of Systems  Constitutional:  Positive for malaise/fatigue. Negative for fever and weight loss.  Respiratory:  Positive for shortness of breath. Negative for  cough and wheezing.   Cardiovascular:  Positive for leg swelling. Negative for chest pain and palpitations.  Gastrointestinal:  Negative for abdominal pain and blood in stool.  Genitourinary:  Negative for dysuria.  Musculoskeletal:  Positive for myalgias.  Skin:  Negative for rash.  Neurological:  Positive for headaches. Negative for dizziness.  Endo/Heme/Allergies:  Bruises/bleeds easily.  Psychiatric/Behavioral:  Negative for depression. The patient is not nervous/anxious.      OBJECTIVE Physical Exam: Vitals:   04/07/24 1313  BP: 125/85  Pulse: 71  Weight: 137 lb (62.1 kg)  Height: 5' 2.5 (1.588 m)    Physical Exam Vitals reviewed. Exam conducted with a chaperone present.  Constitutional:      General: She is not in acute distress. Pulmonary:     Effort: Pulmonary effort is normal.  Abdominal:     General: There is no distension.     Palpations: Abdomen is soft.     Tenderness: There is no abdominal tenderness. There is no rebound.  Musculoskeletal:        General: No swelling. Normal range of motion.  Skin:    General: Skin is warm and dry.     Findings: No rash.  Neurological:     Mental Status: She is alert and oriented to person, place, and time.  Psychiatric:        Mood and Affect: Mood normal.        Behavior: Behavior normal.      GU / Detailed Urogynecologic Evaluation:  Pelvic Exam: Vulvar atrophy  with small fissure on right labia minora fold; Bartholin's and Skene's glands normal in appearance; urethral meatus normal in appearance, no urethral masses or discharge.   CST: negative  s/p hysterectomy: Speculum exam reveals normal vaginal mucosa with  atrophy and normal vaginal cuff.  Adnexa no mass, fullness, tenderness.     Pelvic floor strength I/V, puborectalis I/V external anal sphincter II/V  Pelvic floor musculature: Right levator non-tender, Right obturator non-tender, Left levator non-tender, Left obturator non-tender  POP-Q:   POP-Q  -1                                            Aa   -1                                           Ba  -5                                              C   2                                            Gh  4                                            Pb  5  tvl   -3                                            Ap  -3                                            Bp                                                 D      Rectal Exam:  Normal sphincter tone, no distal rectocele, enterocoele not present, no rectal masses, noted dyssynergia when asking the patient to bear down.  Post-Void Residual (PVR) by Bladder Scan: In order to evaluate bladder emptying, we discussed obtaining a postvoid residual and patient agreed to this procedure.  Procedure: The ultrasound unit was placed on the patient's abdomen in the suprapubic region after the patient had voided.      Laboratory Results: Lab Results  Component Value Date   COLORU yellow 03/25/2024   CLARITYU clear 03/25/2024   GLUCOSEUR negative 03/25/2024   BILIRUBINUR negative 03/25/2024   SPECGRAV 1.015 03/25/2024   RBCUR trace-lysed (A) 03/25/2024   PHUR 7.5 03/25/2024   UROBILINOGEN 0.2 03/25/2024   LEUKOCYTESUR Small (1+) (A) 03/25/2024    Lab Results  Component Value Date   CREATININE 0.97 02/24/2024   CREATININE 1.07 (H)  08/22/2023   CREATININE 0.80 04/15/2023    No results found for: HGBA1C  Lab Results  Component Value Date   HGB 15.0 03/25/2024     ASSESSMENT AND PLAN Ms. Silbaugh is a 78 y.o. with:  1. Recurrent urinary tract infection   2. Recurrent UTI   3. Vaginal atrophy   4. Urge incontinence   5. Prolapse of anterior vaginal wall     Recurrent urinary tract infection -     Estradiol ; Place 0.5g nightly for two weeks then twice a week after  Dispense: 42.5 g; Refill: 11  Recurrent UTI Assessment & Plan: -For treatment of recurrent urinary tract infections, we discussed management of recurrent UTIs including prophylaxis with a daily low dose antibiotic, transvaginal estrogen therapy, D-mannose, and cranberry supplements.   - Will start with vaginal estrogen. We discussed the potential associated risks are very low with vaginal estrogen use due to the very low systemic absorption rate of ~ 0.01% with a twice-week regimen. - Also encouraged D-mannose OTC since she has E.Coli UTI recently.  - We also discussed testing only if she has symptoms as doing a test of cure can lead to overuse of antibiotics and resistances. Unclear if she is having true UTI symptoms since she has more pain in the vulvar area as well.  - Cath urine sample sent for [facility] molecular testing today.  - Advised to call the office with UTI symptoms so we can obtain a urine sample.    Vaginal atrophy Assessment & Plan: - In addition to vaginal estrogen, recommended daily use of vaginal moisturizer such as coconut oil or vitamin E cream.   Orders: -     Estradiol ; Place 0.5g nightly for two weeks then twice a week after  Dispense: 42.5 g; Refill: 11  Urge incontinence Assessment & Plan: -We discussed the symptoms of overactive bladder (OAB), which include urinary urgency, urinary frequency, nocturia, with or without urge incontinence.  While we do not know the exact etiology of OAB, several treatment options  exist. We discussed management including behavioral therapy (decreasing bladder irritants, urge suppression strategies, timed voids, bladder retraining), physical therapy, medication.  - Since symptoms are infrequent, we discussed increasing water intake and decreasing bladder irritants like soda. She is not currently interested in pelvic PT.     Prolapse of anterior vaginal wall Assessment & Plan: - mild prolapse, asymptomatic, will monitor   Return 3 months or sooner if needed   Rosaline LOISE Caper, MD

## 2024-04-07 NOTE — Assessment & Plan Note (Signed)
-   In addition to vaginal estrogen, recommended daily use of vaginal moisturizer such as coconut oil or vitamin E cream.

## 2024-04-07 NOTE — Addendum Note (Signed)
 Addended by: KRYSTAL ANDREE GAILS on: 04/07/2024 03:06 PM   Modules accepted: Orders

## 2024-04-07 NOTE — Assessment & Plan Note (Signed)
-   mild prolapse, asymptomatic, will monitor

## 2024-04-07 NOTE — Assessment & Plan Note (Addendum)
-  For treatment of recurrent urinary tract infections, we discussed management of recurrent UTIs including prophylaxis with a daily low dose antibiotic, transvaginal estrogen therapy, D-mannose, and cranberry supplements.   - Will start with vaginal estrogen. We discussed the potential associated risks are very low with vaginal estrogen use due to the very low systemic absorption rate of ~ 0.01% with a twice-week regimen. - Also encouraged D-mannose OTC since she has E.Coli UTI recently.  - We also discussed testing only if she has symptoms as doing a test of cure can lead to overuse of antibiotics and resistances. Unclear if she is having true UTI symptoms since she has more pain in the vulvar area as well.  - Cath urine sample sent for [facility] molecular testing today.  - Advised to call the office with UTI symptoms so we can obtain a urine sample.

## 2024-04-07 NOTE — Patient Instructions (Addendum)
 Start vaginal estrogen therapy nightly for two weeks then 2 times weekly at night for treatment of vaginal atrophy (dryness of the vaginal tissues) and recurrent UTIs. Place at the vaginal opening and the urethra, and anywhere on the vulva that feels sore.  Please let us  know if the prescription is too expensive and we can look for alternative options.   Vulvovaginal moisturizer Options: Vitamin E oil (pump or capsule) or cream (Gene's Vit E Cream) Coconut oil Silicone-based lubricant for use during intercourse (wet platinum is a brand available at most drugstores) Crisco Consider the ingredients of the product - the fewer the ingredients the better!  Directions for Use: Clean and dry your hands Gently dab the vulvar/vaginal area dry as needed Apply a "pea-sized" amount of the moisturizer onto your fingertip Using you other hand, open the labia  Apply the moisturizer to the vulvar/vaginal tissues Wear loose fitting underwear/clothing if possible following application Use moisturize up to 3 times daily as desired.   For urinary trace infection prevention, use the vaginal estrogen cream as prescribed. You can also start the over the counter supplements D-mannose and cranberry.   Today we talked about ways to manage bladder urgency such as altering your diet to avoid irritative beverages and foods (bladder diet) as well as attempting to decrease stress and other exacerbating factors.  Can also try pelvic physical therapy.   The Most Bothersome Foods* The Least Bothersome Foods*  Coffee - Regular & Decaf Tea - caffeinated Carbonated beverages - cola, non-colas, diet & caffeine-free Alcohols - Beer, Red Wine, White Wine, [address] - Grapefruit, [place], Orange, [employer] - Cranberry, Grapefruit, Orange, Pineapple Vegetables - Tomato & Tomato Products Flavor Enhancers - Hot peppers, Spicy foods, Chili, Horseradish, Vinegar, Monosodium glutamate (MSG) Artificial  Sweeteners - NutraSweet, Sweet 'N Low, Equal (sweetener), Saccharin Ethnic foods - [place], [place], [place] food [employer] - low-fat & whole Fruits - Bananas, Blueberries, Honeydew melon, Pears, Raisins, Watermelon Vegetables - Broccoli, [address] Sprouts, [place], Carrots, Cauliflower, [place], Cucumber, Mushrooms, Peas, Radishes, Squash, Zucchini, White potatoes, Sweet potatoes & yams Poultry - Chicken, Eggs, [place], [employer] - Beef, Diplomatic Services operational officer, Lamb Seafood - Shrimp, [place] fish, Salmon Grains - Oat, Rice Snacks - Pretzels, Popcorn  *Mitch ALF et al. Diet and its role in interstitial cystitis/bladder pain syndrome (IC/BPS) and comorbid conditions. [employer]. BJU Int. 2012 Jan 11.

## 2024-04-12 ENCOUNTER — Other Ambulatory Visit: Payer: Self-pay

## 2024-04-12 DIAGNOSIS — N390 Urinary tract infection, site not specified: Secondary | ICD-10-CM

## 2024-05-11 ENCOUNTER — Encounter: Payer: Self-pay | Admitting: Sports Medicine

## 2024-05-13 ENCOUNTER — Encounter: Admitting: Medical-Surgical

## 2024-05-13 NOTE — Progress Notes (Unsigned)
        Established patient visit   History of Present Illness   Discussed the use of AI scribe software for clinical note transcription with the patient, who gave verbal consent to proceed.  History of Present Illness            Physical Exam   Physical Exam  Assessment & Plan   Assessment and Plan               Follow up   No follow-ups on file.  __________________________________ Zada FREDRIK Palin, DNP, APRN, FNP-BC Primary Care and Sports Medicine [facility] [place]

## 2024-06-09 ENCOUNTER — Ambulatory Visit: Admitting: Obstetrics and Gynecology

## 2024-07-08 ENCOUNTER — Encounter: Payer: Self-pay | Admitting: Obstetrics and Gynecology

## 2024-07-08 ENCOUNTER — Ambulatory Visit: Admitting: Obstetrics and Gynecology

## 2024-07-08 VITALS — BP 112/79 | HR 78

## 2024-07-08 DIAGNOSIS — N3941 Urge incontinence: Secondary | ICD-10-CM

## 2024-07-08 DIAGNOSIS — N390 Urinary tract infection, site not specified: Secondary | ICD-10-CM

## 2024-07-08 DIAGNOSIS — N952 Postmenopausal atrophic vaginitis: Secondary | ICD-10-CM | POA: Diagnosis not present

## 2024-07-08 DIAGNOSIS — Z87440 Personal history of urinary (tract) infections: Secondary | ICD-10-CM

## 2024-07-08 LAB — POCT URINALYSIS DIP (CLINITEK)
Bilirubin, UA: NEGATIVE
Glucose, UA: NEGATIVE mg/dL
Ketones, POC UA: NEGATIVE mg/dL
Nitrite, UA: POSITIVE — AB
POC PROTEIN,UA: NEGATIVE
Spec Grav, UA: 1.025 (ref 1.010–1.025)
Urobilinogen, UA: 0.2 U/dL
pH, UA: 6 (ref 5.0–8.0)

## 2024-07-08 MED ORDER — TROSPIUM CHLORIDE 20 MG PO TABS: 20.0000 mg | ORAL_TABLET | Freq: Two times a day (BID) | ORAL | 5 refills | Status: DC

## 2024-07-08 NOTE — Progress Notes (Signed)
 [facility] Return Visit  SUBJECTIVE  History of Present Illness: Cindy [name] is a 78 y.o. female seen in follow-up for rUTI and urinary frequency. Plan at last visit was start vaginal estrogen cream. Patient reports she has been doing well on vaginal estrogen cream. She has had a few instances where she felt she may have been getting symptoms but took Pyridium and symptoms improved.   Patient reports her most irritative symptom at this time is frequency.     Past Medical History: Patient  has a past medical history of Abnormal mammogram, Allergic rhinitis (10/30/2018), Allergy, Anxiety in acute stress reaction (10/30/2018), Arthritis, Atypical chest pain (10/30/2018), Cancer (HCC), Cataract, Chest pain, Colon polyps, Dyspnea, Family history of breast cancer (07/05/2020), Family history of pancreatic cancer (07/05/2020), Family history of prostate cancer (07/05/2020), Genetic testing (07/11/2020), GERD (gastroesophageal reflux disease) (10/30/2018), History of kidney stones, Hypertension (10/30/2018), Hypothyroidism (10/30/2018), Migraine (10/30/2018), Osteoporosis (10/30/2018), Personal history of chemotherapy, Personal history of radiation therapy, Pneumonia, Postmenopausal atrophic vaginitis (10/30/2018), and Vitamin D  deficiency (10/30/2018).   Past Surgical History: She  has a past surgical history that includes Hysterectomy abdominal with salpingectomy; Tonsillectomy; Tubal ligation; Eye surgery (Bilateral); Colonoscopy w/ polypectomy; Esophageal dilation; Breast lumpectomy with radioactive seed and sentinel lymph node biopsy (Left, 08/15/2020); Portacath placement (Right, 08/15/2020); Port-a-cath removal (01/04/2021); Breast biopsy (Left, 06/26/2020); Breast lumpectomy (Left, 08/15/2020); Abdominal hysterectomy; and Knee surgery (Right, 09/13/2022).   Medications: She has a current medication list which includes the following prescription(s): albuterol , azelastine ,  denosumab , dorzolamide-timolol, escitalopram , esomeprazole , estradiol , fluticasone , hydrochlorothiazide , latanoprost, levothyroxine , nifedipine , nitroglycerin , rosuvastatin , trospium, and vitamin b-12, and the following Facility-Administered Medications: denosumab  and [START ON 09/06/2024] denosumab .   Allergies: Patient is allergic to sulfamethoxazole-trimethoprim and tetracyclines & related.   Social History: Patient  reports that she has never smoked. She has never used smokeless tobacco. She reports current alcohol use. She reports that she does not use drugs.     OBJECTIVE    Lab Results  Component Value Date   COLORU yellow 07/08/2024   CLARITYU clear 07/08/2024   GLUCOSEUR negative 07/08/2024   BILIRUBINUR negative 07/08/2024   SPECGRAV 1.025 07/08/2024   RBCUR small (A) 07/08/2024   PHUR 6.0 07/08/2024   UROBILINOGEN 0.2 07/08/2024   LEUKOCYTESUR Trace (A) 07/08/2024     Physical Exam: Vitals:   07/08/24 1119  BP: 112/79  Pulse: 78   Gen: No apparent distress, A&O x 3.  Detailed Urogynecologic Evaluation:  Deferred.   ASSESSMENT AND PLAN    [name] is a 78 y.o. with:  1. Urge incontinence   2. Recurrent UTI   3. Vaginal atrophy    Patient is not eligible for other anitcholinergics due to her age, chronic constipation, and dry eyes. Would not suggest Myrbetriq due to Glaucoma. Gemtesa is not estimated to be covered. Will start patient on Trospium 20mg . We discussed starting to take it once per day and seeing if this supports her symptoms. We also discussed doing non-citrus flavored drinks and decreasing her cola intake.  Patient reports at this time she has been well managed. Would like urine checked today.  Patient to continue doing her estrogen cream twice a week to maintain support.   Patient to return in 6 weeks for medication follow up or sooner if needed.   Jaculin Rasmus G Azaela Caracci, NP

## 2024-07-08 NOTE — Patient Instructions (Addendum)
 Continue estrogen cream x2 weekly.   Please start Trospium 20mg  once a day, you can increase it to twice a day if needed but let us  start with once a day.

## 2024-07-13 ENCOUNTER — Telehealth: Payer: Self-pay | Admitting: Obstetrics and Gynecology

## 2024-07-13 DIAGNOSIS — N390 Urinary tract infection, site not specified: Secondary | ICD-10-CM

## 2024-07-13 MED ORDER — AMOXICILLIN-POT CLAVULANATE 875-125 MG PO TABS: 1.0000 | ORAL_TABLET | Freq: Two times a day (BID) | ORAL | 0 refills | Status: DC

## 2024-07-13 NOTE — Telephone Encounter (Signed)
 [facility] Results   Alloscardovia omnicolens >=100,000 cells/mL  Escherichia coli >=100,000 cells/mL  Actinotignum schaalii 50,000-99,999 cells/mL  Aerococcus urinae 50,000-99,999 cells/mL   Called and spoke to patient. Augmentin  suggested to treat all 4 bacteria present. Patient informed and reports understanding of plan of care. [facility] to be scanned into chart.

## 2024-07-19 ENCOUNTER — Ambulatory Visit: Attending: Radiation Oncology

## 2024-07-19 VITALS — Wt 138.0 lb

## 2024-07-19 DIAGNOSIS — Z483 Aftercare following surgery for neoplasm: Secondary | ICD-10-CM | POA: Insufficient documentation

## 2024-07-19 NOTE — Therapy (Signed)
 OUTPATIENT PHYSICAL THERAPY SOZO SCREENING NOTE   Patient Name: Cindy [name] MRN: [id number] DOB:06-11-1946, 78 y.o., female Today's Date: 07/19/2024  PCP: Willo Mini, NP REFERRING PROVIDER: Izell Domino, MD   PT End of Session - 07/19/24 1003     Visit Number 12   # unchanged due to screen only   PT Start Time 1002    PT Stop Time 1006    PT Time Calculation (min) 4 min    Activity Tolerance Patient tolerated treatment well    Behavior During Therapy [facility] for tasks assessed/performed          Past Medical History:  Diagnosis Date   Abnormal mammogram    Allergic rhinitis 10/30/2018   Allergy    Tetracycoline. Sulfa   Anxiety in acute stress reaction 10/30/2018   Arthritis    Atypical chest pain 10/30/2018   Cancer [facility])    breast    Cataract    Both eyes surgery   Chest pain    not cardiac related   Colon polyps    Dyspnea    DOE   Family history of breast cancer 07/05/2020   Family history of pancreatic cancer 07/05/2020   Family history of prostate cancer 07/05/2020   Genetic testing 07/11/2020   GERD (gastroesophageal reflux disease) 10/30/2018   History of kidney stones    passed   Hypertension 10/30/2018   Hypothyroidism 10/30/2018   Migraine 10/30/2018   Osteoporosis 10/30/2018   Personal history of chemotherapy    Personal history of radiation therapy    Pneumonia    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   Vitamin D  deficiency 10/30/2018   Past Surgical History:  Procedure Laterality Date   ABDOMINAL HYSTERECTOMY     BREAST BIOPSY Left 06/26/2020   x2   BREAST LUMPECTOMY Left 08/15/2020   BREAST LUMPECTOMY WITH RADIOACTIVE SEED AND SENTINEL LYMPH NODE BIOPSY Left 08/15/2020   Procedure: LEFT BREAST LUMPECTOMY WITH RADIOACTIVE SEED AND SENTINEL LYMPH NODE BIOPSY;  Surgeon: Aron Shoulders, MD;  Location: [facility] OR;  Service: General;  Laterality: Left;  RNFA   COLONOSCOPY W/ POLYPECTOMY     ESOPHAGEAL DILATION     EYE SURGERY  Bilateral    catheter with lens   HYSTERECTOMY ABDOMINAL WITH SALPINGECTOMY     KNEE SURGERY Right 09/13/2022   PORT-A-CATH REMOVAL  01/04/2021   Procedure: REMOVAL PORT-A-CATH;  Surgeon: Aron Shoulders, MD;  Location: [facility];  Service: General;;   PORTACATH PLACEMENT Right 08/15/2020   Procedure: INSERTION PORT-A-CATH;  Surgeon: Aron Shoulders, MD;  Location: [facility] OR;  Service: General;  Laterality: Right;   TONSILLECTOMY     TUBAL LIGATION     Patient Active Problem List   Diagnosis Date Noted   Urge incontinence 04/07/2024   Prolapse of anterior vaginal wall 04/07/2024   Hair loss 03/25/2024   Recurrent UTI 03/25/2024   Fever, low grade 11/04/2023   Lumbar spondylosis 03/04/2023   Port-A-Cath in place 09/12/2020   Malignant neoplasm of upper-outer quadrant of left breast in female, estrogen receptor negative (HCC) 06/30/2020   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/23/2019   Primary osteoarthritis of both hands 06/23/2019   Trigger thumb of left hand 06/23/2019   Polyarthralgia 06/23/2019   Hypertension 10/30/2018   Atypical chest pain 10/30/2018   Migraine 10/30/2018   Hypothyroidism 10/30/2018   Osteoporosis 10/30/2018   GERD (gastroesophageal reflux disease) 10/30/2018   Allergic rhinitis 10/30/2018   Vitamin D  deficiency 10/30/2018   Vaginal atrophy 10/30/2018  REFERRING DIAG: left breast cancer at risk for lymphedema  THERAPY DIAG: Aftercare following surgery for neoplasm  PERTINENT HISTORY: Patient was diagnosed on 05/10/2020 with 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 She had left lumpectomy on 08/15/2020 wth 2 negative nodes removed. Ki67 is 80%. Radiation complete   PRECAUTIONS: left UE Lymphedema risk, None  SUBJECTIVE: Pt returns for her last 6 month L-Dex screen.   PAIN:  Are you having pain? No  SOZO SCREENING: Patient was assessed today using the SOZO machine to determine the lymphedema index score.  This was compared to her baseline score. It was determined that she is within the recommended range when compared to her baseline and no further action is needed at this time. She will continue SOZO screenings. These are done every 3 months for 2 years post operatively followed by every 6 months for 2 years, and then annually.    L-DEX FLOWSHEETS - 07/19/24 1000       L-DEX LYMPHEDEMA SCREENING   Measurement Type Unilateral    L-DEX MEASUREMENT EXTREMITY Upper Extremity    POSITION  Standing    DOMINANT SIDE Right    At Risk Side Left    BASELINE SCORE (UNILATERAL) 3.9    L-DEX SCORE (UNILATERAL) 4.1    VALUE CHANGE (UNILAT) 0.2         P: Pt request to hold on SOZO's for now as she has reached her 4 years from surgery.    Aden Berwyn Caldron, PTA 07/19/2024, 10:05 AM

## 2024-07-25 ENCOUNTER — Encounter: Payer: Self-pay | Admitting: Obstetrics and Gynecology

## 2024-07-26 MED ORDER — VIBEGRON 75 MG PO TABS: 75.0000 mg | ORAL_TABLET | Freq: Every day | ORAL | 5 refills | Status: DC

## 2024-07-26 NOTE — Addendum Note (Signed)
 Addended by: Mayari Matus G on: 07/26/2024 04:01 PM   Modules accepted: Orders

## 2024-07-28 NOTE — Telephone Encounter (Signed)
 I'm sending the prescription over to the [facility] pharmacy.

## 2024-08-02 ENCOUNTER — Other Ambulatory Visit: Payer: Self-pay | Admitting: Adult Health

## 2024-08-02 DIAGNOSIS — Z1231 Encounter for screening mammogram for malignant neoplasm of breast: Secondary | ICD-10-CM

## 2024-08-02 DIAGNOSIS — Z853 Personal history of malignant neoplasm of breast: Secondary | ICD-10-CM

## 2024-08-07 DIAGNOSIS — N179 Acute kidney failure, unspecified: Secondary | ICD-10-CM | POA: Diagnosis present

## 2024-08-07 DIAGNOSIS — N132 Hydronephrosis with renal and ureteral calculous obstruction: Secondary | ICD-10-CM | POA: Diagnosis present

## 2024-08-07 DIAGNOSIS — R946 Abnormal results of thyroid function studies: Secondary | ICD-10-CM | POA: Diagnosis not present

## 2024-08-07 DIAGNOSIS — N10 Acute tubulo-interstitial nephritis: Secondary | ICD-10-CM | POA: Diagnosis present

## 2024-08-07 DIAGNOSIS — I10 Essential (primary) hypertension: Secondary | ICD-10-CM | POA: Diagnosis present

## 2024-08-07 DIAGNOSIS — Z96659 Presence of unspecified artificial knee joint: Secondary | ICD-10-CM | POA: Diagnosis present

## 2024-08-07 DIAGNOSIS — Z960 Presence of urogenital implants: Secondary | ICD-10-CM | POA: Diagnosis not present

## 2024-08-07 DIAGNOSIS — Z87440 Personal history of urinary (tract) infections: Secondary | ICD-10-CM | POA: Diagnosis not present

## 2024-08-07 DIAGNOSIS — F32A Depression, unspecified: Secondary | ICD-10-CM | POA: Diagnosis present

## 2024-08-07 DIAGNOSIS — E039 Hypothyroidism, unspecified: Secondary | ICD-10-CM | POA: Diagnosis present

## 2024-08-07 DIAGNOSIS — Z79890 Hormone replacement therapy (postmenopausal): Secondary | ICD-10-CM | POA: Diagnosis not present

## 2024-08-07 DIAGNOSIS — I959 Hypotension, unspecified: Secondary | ICD-10-CM | POA: Diagnosis not present

## 2024-08-07 DIAGNOSIS — Z882 Allergy status to sulfonamides status: Secondary | ICD-10-CM | POA: Diagnosis not present

## 2024-08-07 DIAGNOSIS — B9689 Other specified bacterial agents as the cause of diseases classified elsewhere: Secondary | ICD-10-CM | POA: Diagnosis not present

## 2024-08-07 DIAGNOSIS — K5730 Diverticulosis of large intestine without perforation or abscess without bleeding: Secondary | ICD-10-CM | POA: Diagnosis not present

## 2024-08-07 DIAGNOSIS — Z79899 Other long term (current) drug therapy: Secondary | ICD-10-CM | POA: Diagnosis not present

## 2024-08-07 DIAGNOSIS — Z791 Long term (current) use of non-steroidal anti-inflammatories (NSAID): Secondary | ICD-10-CM | POA: Diagnosis not present

## 2024-08-07 DIAGNOSIS — Z881 Allergy status to other antibiotic agents status: Secondary | ICD-10-CM | POA: Diagnosis not present

## 2024-08-07 DIAGNOSIS — K449 Diaphragmatic hernia without obstruction or gangrene: Secondary | ICD-10-CM | POA: Diagnosis not present

## 2024-08-07 DIAGNOSIS — F419 Anxiety disorder, unspecified: Secondary | ICD-10-CM | POA: Diagnosis present

## 2024-08-07 DIAGNOSIS — K219 Gastro-esophageal reflux disease without esophagitis: Secondary | ICD-10-CM | POA: Diagnosis present

## 2024-08-07 DIAGNOSIS — J309 Allergic rhinitis, unspecified: Secondary | ICD-10-CM | POA: Diagnosis present

## 2024-08-07 DIAGNOSIS — N200 Calculus of kidney: Secondary | ICD-10-CM | POA: Diagnosis not present

## 2024-08-09 ENCOUNTER — Encounter: Payer: Self-pay | Admitting: Obstetrics and Gynecology

## 2024-08-10 NOTE — Nursing Note (Signed)
 AVS reviewed with pt and husband. All questions and concerns answered. Pt discharged home with husband.

## 2024-08-11 ENCOUNTER — Telehealth: Payer: Self-pay | Admitting: *Deleted

## 2024-08-11 NOTE — Transitions of Care (Post Inpatient/ED Visit) (Signed)
 08/11/2024  Name: Cindy [name] MRN: [id number] DOB: 03-19-1946  Today's TOC FU Call Status: Today's TOC FU Call Status:: Successful TOC FU Call Completed TOC FU Call Complete Date: 08/11/24  Patient's Name and Date of Birth confirmed. Name, DOB  Transition Care Management Follow-up Telephone Call Date of Discharge: 08/10/24 Discharge Facility: Other ([facility]) Name of Other ([facility]) Discharge Facility: [facility] Type of Discharge: Inpatient Admission Primary Inpatient Discharge Diagnosis:: Diagnoses   Acute pyelonephritis due to bacteria How have you been since you were released from the hospital?: Better (feeling better  eating, drinking well, no issues with urine output, having diarrhea at times,  ambulating without difficulty) Any questions or concerns?: No  Items Reviewed: Did you receive and understand the discharge instructions provided?: Yes Medications obtained,verified, and reconciled?: Yes (Medications Reviewed) Any new allergies since your discharge?: No Dietary orders reviewed?: Yes Type of Diet Ordered:: heart healthy Do you have support at home?: Yes People in Home [RPT]: spouse Name of Support/Comfort Primary Source: Cara Mages Reviewed signs /symptoms infection, UTI, prevention strategies Reviewed all upcoming appointments, pt states she has all contact #'s for follow up as she will be having lithotripsy and ureteral stent will be removed Reviewed side effects of antibiotic, diarrhea, nausea,  pt has occasional diarrhea, has probiotic on hand and may start taking, has not decided  Medications Reviewed Today: Medications Reviewed Today     Reviewed by Aura Mliss LABOR, RN (Registered Nurse) on 08/11/24 at 1143  Med List Status: <None>   Medication Order Taking? Sig Documenting Provider Last Dose Status Informant  albuterol  (VENTOLIN  HFA) 108 (90 Base) MCG/ACT inhaler [id number] Yes Inhale 2 puffs into the lungs every 6 (six) hours as needed  for wheezing. Willo Mini, NP  Active   amoxicillin -clavulanate (AUGMENTIN ) 875-125 MG tablet [id number] Yes Take 1 tablet by mouth 2 (two) times daily. Zuleta, Kaitlin G, NP  Active   azelastine  (ASTELIN ) 0.1 % nasal spray [id number] Yes Place 2 sprays into both nostrils 2 (two) times daily as needed for rhinitis. Use in each nostril as directed Willo Mini, NP  Active   denosumab  (PROLIA ) 60 MG/ML SOSY injection [id number] Yes Inject 60 mg into the skin every 6 (six) months. [provider]  Active Self           Med Note SOILA, LYLE BROCKS   Wed Dec 27, 2020 11:20 AM)    denosumab  (PROLIA ) injection 60 mg [id number]   Jessup, Joy, NP  Active   denosumab  (PROLIA ) injection 60 mg [id number]   Willo Mini, NP  Active   dorzolamide-timolol (COSOPT) 2-0.5 % ophthalmic solution [id number] Yes 1 drop 2 (two) times daily. [provider]  Active   escitalopram  (LEXAPRO ) 5 MG tablet [id number] Yes Take 1 tablet (5 mg total) by mouth daily. Willo Mini, NP  Active   esomeprazole  (NEXIUM ) 40 MG capsule [id number] Yes Take 1 capsule (40 mg total) by mouth daily. Willo Mini, NP  Active   estradiol  (ESTRACE ) 0.1 MG/GM vaginal cream [id number] Yes Place 0.5g nightly for two weeks then twice a week after Marilynne Rosaline SAILOR, MD  Active   fluticasone  (FLONASE ) 50 MCG/ACT nasal spray [id number]  Place 2 sprays into both nostrils daily.  Patient not taking: Reported on 08/11/2024   Almarie Birmingham B, NP  Expired 07/08/24 2359   hydrochlorothiazide  (HYDRODIURIL ) 25 MG tablet [id number] Yes TAKE 1 TABLET BY MOUTH ONCE DAILY AS NEEDED SWELLING Willo, Joy, NP  Active   latanoprost (XALATAN) 0.005 % ophthalmic solution  [id number] Yes  [provider]  Active   levothyroxine  (SYNTHROID ) 88 MCG tablet [id number] Yes Take 1 tablet (88 mcg total) by mouth daily before breakfast. Willo Mini, NP  Active   naproxen sodium (ALEVE) 220 MG tablet [id number]  Take 220 mg by mouth daily as needed (as needed).  [provider]  Active   NIFEdipine  (ADALAT  CC) 30 MG 24 hr tablet [id number] Yes Take 1 tablet (30 mg total) by mouth daily. Willo Mini, NP  Active   nitroGLYCERIN  (NITROSTAT ) 0.4 MG SL tablet [id number] Yes Place 1 tablet (0.4 mg total) under the tongue every 5 (five) minutes as needed for chest pain. Almarie Waddell NOVAK, NP  Active   Oxycodone  HCl 10 MG TABS [id number] Yes Take 5 mg by mouth every 6 (six) hours. As needed for pain [provider]  Active   rosuvastatin  (CRESTOR ) 20 MG tablet [id number] Yes Take 1 tablet (20 mg total) by mouth daily. Willo Mini, NP  Active   Vibegron  75 MG TABS [id number]  Take 1 tablet (75 mg total) by mouth daily.  Patient not taking: Reported on 08/11/2024   Zuleta, Kaitlin G, NP  Active   vitamin B-12 (CYANOCOBALAMIN) 500 MCG tablet [id number]  Take 500 mcg by mouth daily.   Patient not taking: Reported on 08/11/2024   [provider]  Active Self  Med List Note Arvie Orren SAILOR, [place] 03/04/22 1121): Latanoprost, celebrex  need added.             Home Care and Equipment/Supplies: Were Home Health Services Ordered?: No Any new equipment or medical supplies ordered?: No  Functional Questionnaire: Do you need assistance with bathing/showering or dressing?: No Do you need assistance with meal preparation?: No Do you need assistance with eating?: No Do you have difficulty maintaining continence: No Do you need assistance with getting out of bed/getting out of a chair/moving?: No Do you have difficulty managing or taking your medications?: No  Follow up appointments reviewed: PCP Follow-up appointment confirmed?: No (pt prefers to schedule her own appointment, pt is having labwork on 12/22 and prolia  injection Jan. 2026) MD Provider Line Number:[phone] Given: No Specialist Hospital Follow-up appointment confirmed?: Yes Date of Specialist follow-up appointment?: 08/19/24 Follow-Up Specialty Provider:: urogynecology  @ 920 am Do  you need transportation to your follow-up appointment?: No Do you understand care options if your condition(s) worsen?: Yes-patient verbalized understanding  SDOH Interventions Today    Flowsheet Row Most Recent Value  SDOH Interventions   Food Insecurity Interventions Intervention Not Indicated  Housing Interventions Intervention Not Indicated  Transportation Interventions Intervention Not Indicated  Utilities Interventions Intervention Not Indicated    Mliss Creed [facility], BSN RN Care Manager/ Transition of Care [place]/ [facility] Population Health [phone]

## 2024-08-17 DIAGNOSIS — Z961 Presence of intraocular lens: Secondary | ICD-10-CM | POA: Diagnosis not present

## 2024-08-17 DIAGNOSIS — H401133 Primary open-angle glaucoma, bilateral, severe stage: Secondary | ICD-10-CM | POA: Diagnosis not present

## 2024-08-19 ENCOUNTER — Ambulatory Visit: Admitting: Obstetrics and Gynecology

## 2024-08-24 DIAGNOSIS — N200 Calculus of kidney: Secondary | ICD-10-CM | POA: Diagnosis not present

## 2024-08-30 ENCOUNTER — Other Ambulatory Visit: Payer: Self-pay | Admitting: Obstetrics and Gynecology

## 2024-08-30 ENCOUNTER — Telehealth: Payer: Self-pay

## 2024-08-30 DIAGNOSIS — N390 Urinary tract infection, site not specified: Secondary | ICD-10-CM

## 2024-08-30 DIAGNOSIS — M810 Age-related osteoporosis without current pathological fracture: Secondary | ICD-10-CM

## 2024-08-30 DIAGNOSIS — N3941 Urge incontinence: Secondary | ICD-10-CM

## 2024-08-30 NOTE — Telephone Encounter (Signed)
 Just checking to see the Prolia  prior authorization is in process for this patient? Is it possible that you could check on this for me?

## 2024-08-30 NOTE — Telephone Encounter (Signed)
 Copied from CRM #[id number]. Topic: Clinical - Request for Lab/Test Order >> Aug 30, 2024  9:59 AM Delon HERO wrote: Reason for CRM: Patient is calling to request an order for labs. Requested lab work today or tomorrow   Patient's is scheduled for polia shot in 09/14/23

## 2024-08-30 NOTE — Telephone Encounter (Signed)
 Orders placed today. Pt aware

## 2024-08-31 ENCOUNTER — Ambulatory Visit

## 2024-08-31 NOTE — Telephone Encounter (Signed)
 No problem. I will continue to check on it and will update the referral as soon as possible.

## 2024-08-31 NOTE — Telephone Encounter (Signed)
 I have submitted in [employer] for next year's benefit. Per [employer], Scheduled blackout dates: 09/09/2024 - 09/17/2024. I'm not sure how long it will take for [employer] to verify benefits or if benefits will be verified before 09/18/24.

## 2024-09-01 LAB — CMP14+EGFR
ALT: 13 IU/L (ref 0–32)
AST: 27 IU/L (ref 0–40)
Albumin: 3.8 g/dL (ref 3.8–4.8)
Alkaline Phosphatase: 70 IU/L (ref 49–135)
BUN/Creatinine Ratio: 17 (ref 12–28)
BUN: 17 mg/dL (ref 8–27)
Bilirubin Total: 0.5 mg/dL (ref 0.0–1.2)
CO2: 24 mmol/L (ref 20–29)
Calcium: 9.2 mg/dL (ref 8.7–10.3)
Chloride: 105 mmol/L (ref 96–106)
Creatinine, Ser: 1.03 mg/dL — ABNORMAL HIGH (ref 0.57–1.00)
Globulin, Total: 2.1 g/dL (ref 1.5–4.5)
Glucose: 105 mg/dL — ABNORMAL HIGH (ref 70–99)
Potassium: 4.1 mmol/L (ref 3.5–5.2)
Sodium: 144 mmol/L (ref 134–144)
Total Protein: 5.9 g/dL — ABNORMAL LOW (ref 6.0–8.5)
eGFR: 55 mL/min/1.73 — ABNORMAL LOW

## 2024-09-03 ENCOUNTER — Ambulatory Visit
Admission: RE | Admit: 2024-09-03 | Discharge: 2024-09-03 | Disposition: A | Discharge status: Home / Self Care | Source: Ambulatory Visit | Attending: Adult Health | Admitting: Adult Health

## 2024-09-03 ENCOUNTER — Ambulatory Visit: Payer: Self-pay | Admitting: Medical-Surgical

## 2024-09-03 DIAGNOSIS — Z853 Personal history of malignant neoplasm of breast: Secondary | ICD-10-CM

## 2024-09-03 DIAGNOSIS — Z1231 Encounter for screening mammogram for malignant neoplasm of breast: Secondary | ICD-10-CM

## 2024-09-08 NOTE — Telephone Encounter (Signed)
 Patient appt for Monday 09/13/2024 has been moved to Friday 09/17/2024 due to not having a prior authorization in place before the scheduled time. Awaiting this result.

## 2024-09-10 ENCOUNTER — Other Ambulatory Visit (HOSPITAL_COMMUNITY): Payer: Self-pay

## 2024-09-10 ENCOUNTER — Telehealth: Payer: Self-pay

## 2024-09-10 ENCOUNTER — Ambulatory Visit: Payer: Self-pay | Admitting: Medical-Surgical

## 2024-09-10 ENCOUNTER — Encounter: Payer: Self-pay | Admitting: Hematology and Oncology

## 2024-09-10 DIAGNOSIS — K219 Gastro-esophageal reflux disease without esophagitis: Secondary | ICD-10-CM

## 2024-09-10 NOTE — Progress Notes (Unsigned)
 "    HPI: Follow-up chest pain. Patient previously resided in [place]  and moved here in 2019 to be close to family.  She has had occasional chest pain intermittently for years.  Coronary CTA September 2021 showed calcium  score 0, no coronary disease and spotty aortic atherosclerosis.  Echocardiogram April 2024 showed normal LV function, grade 1 diastolic dysfunction.  Since last seen   Current Outpatient Medications  Medication Sig Dispense Refill   albuterol  (VENTOLIN  HFA) 108 (90 Base) MCG/ACT inhaler Inhale 2 puffs into the lungs every 6 (six) hours as needed for wheezing. 2 each 11   amoxicillin -clavulanate (AUGMENTIN ) 875-125 MG tablet Take 1 tablet by mouth 2 (two) times daily. 20 tablet 0   azelastine  (ASTELIN ) 0.1 % nasal spray Place 2 sprays into both nostrils 2 (two) times daily as needed for rhinitis. Use in each nostril as directed 30 mL 1   denosumab  (PROLIA ) 60 MG/ML SOSY injection Inject 60 mg into the skin every 6 (six) months.     dorzolamide-timolol (COSOPT) 2-0.5 % ophthalmic solution 1 drop 2 (two) times daily.     escitalopram  (LEXAPRO ) 5 MG tablet Take 1 tablet (5 mg total) by mouth daily. 90 tablet 3   esomeprazole  (NEXIUM ) 40 MG capsule Take 1 capsule (40 mg total) by mouth daily. 90 capsule 3   estradiol  (ESTRACE ) 0.1 MG/GM vaginal cream Place 0.5g nightly for two weeks then twice a week after 42.5 g 11   fluticasone  (FLONASE ) 50 MCG/ACT nasal spray Place 2 sprays into both nostrils daily. (Patient not taking: Reported on 08/11/2024) 1 g 2   hydrochlorothiazide  (HYDRODIURIL ) 25 MG tablet TAKE 1 TABLET BY MOUTH ONCE DAILY AS NEEDED SWELLING 90 tablet 1   latanoprost (XALATAN) 0.005 % ophthalmic solution      levothyroxine  (SYNTHROID ) 88 MCG tablet Take 1 tablet (88 mcg total) by mouth daily before breakfast. 90 tablet 3   naproxen sodium (ALEVE) 220 MG tablet Take 220 mg by mouth daily as needed (as needed).     NIFEdipine  (ADALAT  CC) 30 MG 24 hr tablet Take 1 tablet  (30 mg total) by mouth daily. 90 tablet 3   nitroGLYCERIN  (NITROSTAT ) 0.4 MG SL tablet Place 1 tablet (0.4 mg total) under the tongue every 5 (five) minutes as needed for chest pain. 20 tablet 1   rosuvastatin  (CRESTOR ) 20 MG tablet Take 1 tablet (20 mg total) by mouth daily. 90 tablet 3   Vibegron  75 MG TABS Take 1 tablet (75 mg total) by mouth daily. (Patient not taking: Reported on 08/11/2024) 30 tablet 5   vitamin B-12 (CYANOCOBALAMIN) 500 MCG tablet Take 500 mcg by mouth daily.  (Patient not taking: Reported on 08/11/2024)     Current Facility-Administered Medications  Medication Dose Route Frequency Provider Last Rate Last Admin   denosumab  (PROLIA ) injection 60 mg  60 mg Subcutaneous Q6 months Willo Mini, NP         Past Medical History:  Diagnosis Date   Abnormal mammogram    Allergic rhinitis 10/30/2018   Allergy    Tetracycoline. Sulfa   Anxiety in acute stress reaction 10/30/2018   Arthritis    Atypical chest pain 10/30/2018   Cancer [facility])    breast    Cataract    Both eyes surgery   Chest pain    not cardiac related   Colon polyps    Dyspnea    DOE   Family history of breast cancer 07/05/2020   Family history of pancreatic cancer 07/05/2020  Family history of prostate cancer 07/05/2020   Genetic testing 07/11/2020   GERD (gastroesophageal reflux disease) 10/30/2018   History of kidney stones    passed   Hypertension 10/30/2018   Hypothyroidism 10/30/2018   Migraine 10/30/2018   Osteoporosis 10/30/2018   Personal history of chemotherapy    Personal history of radiation therapy    Pneumonia    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   Vitamin D  deficiency 10/30/2018    Past Surgical History:  Procedure Laterality Date   ABDOMINAL HYSTERECTOMY     BREAST BIOPSY Left 06/26/2020   x2   BREAST LUMPECTOMY Left 08/15/2020   BREAST LUMPECTOMY WITH RADIOACTIVE SEED AND SENTINEL LYMPH NODE BIOPSY Left 08/15/2020   Procedure: LEFT BREAST LUMPECTOMY WITH  RADIOACTIVE SEED AND SENTINEL LYMPH NODE BIOPSY;  Surgeon: Aron Shoulders, MD;  Location: [facility];  Service: General;  Laterality: Left;  RNFA   COLONOSCOPY W/ POLYPECTOMY     ESOPHAGEAL DILATION     EYE SURGERY Bilateral    catheter with lens   HYSTERECTOMY ABDOMINAL WITH SALPINGECTOMY     KNEE SURGERY Right 09/13/2022   PORT-A-CATH REMOVAL  01/04/2021   Procedure: REMOVAL PORT-A-CATH;  Surgeon: Aron Shoulders, MD;  Location: [facility] [place];  Service: General;;   PORTACATH PLACEMENT Right 08/15/2020   Procedure: INSERTION PORT-A-CATH;  Surgeon: Aron Shoulders, MD;  Location: [facility] OR;  Service: General;  Laterality: Right;   TONSILLECTOMY     TUBAL LIGATION      Social History   Socioeconomic History   Marital status: Married    Spouse name: Jennine Kava   Number of children: 2   Years of education: 15   Highest education level: Some college, no degree  Occupational History   Occupation: Retired.  Tobacco Use   Smoking status: Never   Smokeless tobacco: Never  Vaping Use   Vaping status: Never Used  Substance and Sexual Activity   Alcohol use: Yes    Comment: rarely   Drug use: Never   Sexual activity: Not Currently    Partners: Male    Birth control/protection: None  Other Topics Concern   Not on file  Social History Narrative   Lives with her husband. She enjoys water colors and gardening.   Social Drivers of Health   Tobacco Use: Low Risk (08/24/2024)   Received from [facility]   Patient History    Smoking Tobacco Use: Never    Smokeless Tobacco Use: Never    Passive Exposure: Not on file  Financial Resource Strain: Low Risk (03/24/2024)   Overall Financial Resource Strain (CARDIA)    Difficulty of Paying Living Expenses: Not hard at all  Food Insecurity: No Food Insecurity (08/11/2024)   Epic    Worried About Programme Researcher, Broadcasting/film/video in the Last Year: Never true    Ran Out of Food in the Last Year: Never true  Transportation Needs: No Transportation  Needs (08/11/2024)   Epic    Lack of Transportation (Medical): No    Lack of Transportation (Non-Medical): No  Physical Activity: Insufficiently Active (03/24/2024)   Exercise Vital Sign    Days of Exercise per Week: 2 days    Minutes of Exercise per Session: 20 min  Stress: No Stress Concern Present (08/07/2024)   Received from [facility] of Occupational Health - Occupational Stress Questionnaire    Do you feel stress - tense, restless, nervous, or anxious, or unable to sleep at night because your mind is troubled  all the time - these days?: Not at all  Social Connections: Moderately Isolated (03/24/2024)   Social Connection and Isolation Panel    Frequency of Communication with Friends and Family: Twice a week    Frequency of Social Gatherings with Friends and Family: Twice a week    Attends Religious Services: Patient declined    Active Member of Clubs or Organizations: No    Attends Engineer, Structural: Not on file    Marital Status: Married  Catering Manager Violence: Not At Risk (08/11/2024)   Epic    Fear of Current or Ex-Partner: No    Emotionally Abused: No    Physically Abused: No    Sexually Abused: No  Depression (PHQ2-9): Low Risk (08/11/2024)   Depression (PHQ2-9)    PHQ-2 Score: 0  Alcohol Screen: Low Risk (03/24/2024)   Alcohol Screen    Last Alcohol Screening Score (AUDIT): 2  Housing: Unknown (08/11/2024)   Epic    Unable to Pay for Housing in the Last Year: No    Number of Times Moved in the Last Year: Not on file    Homeless in the Last Year: No  Utilities: Not At Risk (08/11/2024)   Epic    Threatened with loss of utilities: No  Health Literacy: Adequate Health Literacy (09/23/2023)   B1300 Health Literacy    Frequency of need for help with medical instructions: Never    Family History  Problem Relation Age of Onset   High blood pressure Mother    Breast cancer Mother        dx before 62   Alzheimer's disease Mother     Cancer Mother    Heart attack Father    Prostate cancer Brother 47   Breast cancer Maternal Grandmother        dx after 71   Cancer Maternal Grandmother    Leukemia Paternal Grandfather        dx 26s-80s   Diabetes Daughter    Breast cancer Maternal Aunt        dx 55s   Cancer Maternal Aunt    Breast cancer Maternal Aunt        dx 10s   Cancer Maternal Aunt 50       unknown type   Cancer Maternal Aunt    Pancreatic cancer Cousin        maternal; dx 29s   Breast cancer Cousin        paternal; dx 77s   Bladder Cancer Neg Hx    Renal cancer Neg Hx    Uterine cancer Neg Hx     ROS: no fevers or chills, productive cough, hemoptysis, dysphasia, odynophagia, melena, hematochezia, dysuria, hematuria, rash, seizure activity, orthopnea, PND, pedal edema, claudication. Remaining systems are negative.  Physical Exam: Well-developed well-nourished in no acute distress.  Skin is warm and dry.  HEENT is normal.  Neck is supple.  Chest is clear to auscultation with normal expansion.  Cardiovascular exam is regular rate and rhythm.  Abdominal exam nontender or distended. No masses palpated. Extremities show no edema. neuro grossly intact  ECG- personally reviewed  A/P  1 chest pain-patient has chronic chest pain.  Symptoms are atypical.  Follow-up ECG shows no diagnostic ST changes.  No plans for further ischemia evaluation at this time.  2 hypertension-blood pressure controlled.  Continue present medical regimen.  3 hyperlipidemia-continue statin.  Redell Shallow, MD    "

## 2024-09-10 NOTE — Telephone Encounter (Signed)
 Prolia  VOB initiated via [email or web address]  Next Prolia  inj DUE: NOW   PHARMACY COPAY: [id number]

## 2024-09-13 ENCOUNTER — Ambulatory Visit

## 2024-09-13 MED ORDER — ESOMEPRAZOLE MAGNESIUM 40 MG PO CPDR: 40.0000 mg | DELAYED_RELEASE_CAPSULE | Freq: Every day | ORAL | 1 refills | Status: AC

## 2024-09-13 NOTE — Telephone Encounter (Signed)
 Submitted patient's insurance in [employer]. Benefits for buy and bill have not came back yet.

## 2024-09-13 NOTE — Telephone Encounter (Signed)
 Thank you [name] for getting this done for us . Did they say what the in house cost would be if administered by office supplied ( medical benefits) ?   I did not see the referral for this so could not link it.

## 2024-09-13 NOTE — Telephone Encounter (Signed)
 Requesting rx rf of Esomeprazole  40mg   Last written 11/11/2023 as full year supply  Last OV 03/25/2024 Upcoming appt = none with provider  09/17/2024 nurse visit for prolia  injection

## 2024-09-14 ENCOUNTER — Other Ambulatory Visit (HOSPITAL_COMMUNITY): Payer: Self-pay

## 2024-09-14 NOTE — Telephone Encounter (Signed)
 Cindy [name]

## 2024-09-14 NOTE — Telephone Encounter (Signed)
 Hello [name],  Do you think the buy and bill information will take awhile to return? The patient is currently scheduled for this Friday for the injection and just wanting to check to see if I need to move her appt? Thank you, :)

## 2024-09-14 NOTE — Telephone Encounter (Signed)
 Spoke with patient and informed that we had only yet received the pharmacy benefit results. Relayed these to patient. Informed her that we would let her know when we receive the medical coverage results ( buy and bill) as soon as they are received.

## 2024-09-14 NOTE — Telephone Encounter (Signed)
 Pt ready for scheduling for PROLIA  on or after : 09/14/24  Option# 1: Buy/Bill (Office supplied medication)  Out-of-pocket cost due at time of clinic visit: $0  Number of injection/visits approved: ---  Primary: MEDICARE Prolia  co-insurance: 0% Admin fee co-insurance: 0%  Secondary: [employer] Prolia  co-insurance: covers the Medicare Part B deductible, co-insurance and 100% of the excess charges.  Admin fee co-insurance:   Medical Benefit Details: Date Benefits were checked: 09/13/24 Deductible: $0 Met of $283 Required/ Coinsurance: 0%/ Admin Fee: 0%  Prior Auth: N/A PA# Expiration Date:   # of doses approved: ----------------------------------------------------------------------- Option# 2- Med Obtained from pharmacy:  Pharmacy benefit: Copay $996.05 (Paid to pharmacy) Admin Fee: 0% (Pay at clinic)  Prior Auth: N/A PA# Expiration Date:   # of doses approved:   If patient wants fill through the pharmacy benefit please send prescription to: [facility], and include estimated need by date in rx notes. Pharmacy will ship medication directly to the office.  Patient NOT eligible for Prolia  Copay Card. Copay Card can make patient's cost as little as $25. Link to apply: [email or web address]://[email or web address].[email or web address]/copay  ** This summary of benefits is an estimation of the patient's out-of-pocket cost. Exact cost may very based on individual plan coverage.

## 2024-09-15 NOTE — Telephone Encounter (Signed)
 Patient informed and will keep upcoming appt scheduled for 09/17/2024

## 2024-09-16 NOTE — Telephone Encounter (Signed)
 Authorization received - patient informed -  referral attached to appt - patient blood work cleared by  Cindy [name] for Prolia  injection- patient will keep upcoming appt  for 01/09/226

## 2024-09-17 ENCOUNTER — Ambulatory Visit

## 2024-09-17 VITALS — BP 105/57 | HR 59 | Ht 62.5 in

## 2024-09-17 DIAGNOSIS — M810 Age-related osteoporosis without current pathological fracture: Secondary | ICD-10-CM

## 2024-09-17 MED ORDER — DENOSUMAB 60 MG/ML ~~LOC~~ SOSY: 60.0000 mg | PREFILLED_SYRINGE | SUBCUTANEOUS | Status: AC

## 2024-09-17 NOTE — Progress Notes (Signed)
" ° °  Established Patient Office Visit  Subjective   [name], female    DOB: 01-29-1946  Age: 79 y.o. MRN: [id number]  Chief Complaint  Patient presents with   Age- related Osteoporosis    Prolia  injection - nurse visit    HPI  Age related Osteoporosis- Prolia  injection  nurse visit. Last injection givne 03/15/2024. Patient last labs was 08/31/2024- were reviewed by Zada Palin, NP and approved for injection today. Patient states she has not been taking Vit D or Calcium  x Dec 25,2025 due to kidney stones- she will see the specialist in one week and may be restarted on this. Patient also states she is currently on abx for UTI. She does not have any recent or upcoming dental appts. Patient cleared for Prolia  injection today by ZADA Palin, N after review of this new information.   ROS    Objective:     BP (!) 105/57 (BP Location: Left Arm, Patient Position: Sitting, Cuff Size: Normal)   Pulse (!) 59   Ht 5' 2.5 (1.588 m)   SpO2 97%   BMI 24.84 kg/m    Physical Exam   No results found for any visits on 09/17/24.    The ASCVD Risk score (Arnett DK, et al., 2019) failed to calculate for the following reasons:   The 2019 ASCVD risk score is only valid for ages 82 to 32   * - Cholesterol units were assumed    Assessment & Plan:  Admin Prolia  60mg  SQ left arm . Patient tolerated injection well without complications. Patient will return in 6 mths and 1 day for next Prolia  injection.  Problem List Items Addressed This Visit   None   No follow-ups on file.    Suzen SHAUNNA Plenty, LPN  "

## 2024-09-17 NOTE — Patient Instructions (Signed)
 Return in 2 weeks before next injection of Prolia  due for Lab work to recheck kidney and calcium  levels. Please return in 6 months and one day for next Prolia  injection as nurse visit.

## 2024-09-20 ENCOUNTER — Ambulatory Visit: Admitting: Cardiology

## 2024-09-28 ENCOUNTER — Encounter: Payer: Self-pay | Admitting: Medical-Surgical

## 2024-09-28 ENCOUNTER — Ambulatory Visit: Admitting: Medical-Surgical

## 2024-09-28 VITALS — BP 96/67 | HR 76 | Temp 97.6°F | Resp 20 | Ht 62.5 in | Wt 135.1 lb

## 2024-09-28 DIAGNOSIS — I10 Essential (primary) hypertension: Secondary | ICD-10-CM

## 2024-09-28 DIAGNOSIS — F411 Generalized anxiety disorder: Secondary | ICD-10-CM

## 2024-09-28 DIAGNOSIS — K219 Gastro-esophageal reflux disease without esophagitis: Secondary | ICD-10-CM

## 2024-09-28 DIAGNOSIS — F430 Acute stress reaction: Secondary | ICD-10-CM | POA: Diagnosis not present

## 2024-09-28 DIAGNOSIS — E039 Hypothyroidism, unspecified: Secondary | ICD-10-CM

## 2024-09-28 DIAGNOSIS — I2510 Atherosclerotic heart disease of native coronary artery without angina pectoris: Secondary | ICD-10-CM | POA: Diagnosis not present

## 2024-09-28 DIAGNOSIS — N390 Urinary tract infection, site not specified: Secondary | ICD-10-CM | POA: Diagnosis not present

## 2024-09-28 LAB — POCT URINALYSIS DIP (CLINITEK)
Bilirubin, UA: NEGATIVE
Glucose, UA: NEGATIVE mg/dL
Ketones, POC UA: NEGATIVE mg/dL
Nitrite, UA: POSITIVE — AB
POC PROTEIN,UA: NEGATIVE
Spec Grav, UA: 1.02
Urobilinogen, UA: 0.2 U/dL
pH, UA: 5.5

## 2024-09-28 MED ORDER — ROSUVASTATIN CALCIUM 20 MG PO TABS: 20.0000 mg | ORAL_TABLET | Freq: Every day | ORAL | 3 refills | Status: AC

## 2024-09-28 MED ORDER — NIFEDIPINE ER 30 MG PO TB24: 30.0000 mg | ORAL_TABLET | Freq: Every day | ORAL | 3 refills | Status: AC

## 2024-09-28 MED ORDER — HYDROCHLOROTHIAZIDE 25 MG PO TABS: ORAL_TABLET | ORAL | 3 refills | Status: AC

## 2024-09-28 MED ORDER — ESCITALOPRAM OXALATE 5 MG PO TABS: 5.0000 mg | ORAL_TABLET | Freq: Every day | ORAL | 3 refills | Status: AC

## 2024-09-28 NOTE — Progress Notes (Signed)
" ° °       Established patient visit   History of Present Illness   Discussed the use of AI scribe software for clinical note transcription with the patient, who gave verbal consent to proceed.  History of Present Illness   Cindy [name] is an 79 year old female with a history of urinary tract infections and kidney stones who presents with ongoing urinary symptoms and recent foot injury.  Urinary tract symptoms and nephrolithiasis - Hospitalized in December for kidney stone - Underwent stent placement and lithotripsy - Ongoing urinary tract infections - Increasing water intake and taking cranberry pills - No current urinary symptoms  Adverse effects from bladder control medication - Experienced dizziness and breathlessness while taking a bladder control medication (starting with 'R') - Symptoms improved after discontinuing the medication  Medication use and efficacy - Takes Lexapro  daily; uncertain if continued use is necessary - Uses hydrochlorothiazide  as needed for swelling, rare dosing needed - Takes rosuvastatin  for cholesterol management, well tolerated - Takes nifedipine  for blood pressure control, well tolerated - Recently ran out of Nexium  and substituted with Prilosec, which has been less effective      Physical Exam   Physical Exam Vitals reviewed.  Constitutional:      General: She is not in acute distress.    Appearance: Normal appearance. She is not ill-appearing.  HENT:     Head: Normocephalic and atraumatic.  Cardiovascular:     Rate and Rhythm: Normal rate and regular rhythm.     Pulses: Normal pulses.     Heart sounds: Normal heart sounds. No murmur heard.    No friction rub. No gallop.  Pulmonary:     Effort: Pulmonary effort is normal. No respiratory distress.     Breath sounds: Normal breath sounds. No wheezing.  Skin:    General: Skin is warm and dry.  Neurological:     Mental Status: She is alert and oriented to person, place, and time.   Psychiatric:        Mood and Affect: Mood normal.        Behavior: Behavior normal.        Thought Content: Thought content normal.        Judgment: Judgment normal.    Assessment & Plan   Recurrent urinary tract infection Positive nitrites and WBCs in urine suggesting possible colonization. Sending for culture. Recent antibiotics completed. - Increase water intake to 5-6 bottles daily. - Complete 24-hour urine collection. - Once clarified, update the intolerance list.  Nephrolithiasis status post stent and lithotripsy Post stent and lithotripsy for 7.7 mm stone. Previous UTI possibly related to stone or surgery. - Increase water intake to prevent recurrence.  Gastroesophageal reflux disease Recent refill of Nexium  obtained. - Continue Nexium  as prescribed.  Primary hypertension Blood pressure well-controlled. Recent reading 96/67 mmHg. - Continue Nifedipine  30mg  daily.  Generalized anxiety disorder Managed with Lexapro . Considering discontinuation. - Consider tapering Lexapro : one pill every other day, then twice a week, then stop. Monitor symptoms and resume if necessary.  Coronary artery disease involving native coronary artery of native heart without angina pectoris  Managed with rosuvastatin . UTD on labs. - Continue rosuvastatin  as prescribed.     Follow up   Return in about 6 months (around 03/28/2025) for chronic disease follow up. __________________________________ Zada FREDRIK Palin, DNP, APRN, FNP-BC Primary [facility] "

## 2024-10-03 ENCOUNTER — Ambulatory Visit: Payer: Self-pay | Admitting: Medical-Surgical

## 2024-10-03 ENCOUNTER — Encounter: Payer: Self-pay | Admitting: Medical-Surgical

## 2024-10-03 LAB — URINE CULTURE

## 2024-11-29 ENCOUNTER — Ambulatory Visit: Admitting: Cardiology

## 2025-03-21 ENCOUNTER — Ambulatory Visit

## 2025-03-28 ENCOUNTER — Ambulatory Visit: Admitting: Medical-Surgical
